# Patient Record
Sex: Female | Born: 1985 | Race: White | Hispanic: No | Marital: Married | Smoking: Current some day smoker
Health system: Southern US, Community
[De-identification: ages and names within clinical notes are randomized; demographics above are authoritative.]

## PROBLEM LIST (undated history)

## (undated) DIAGNOSIS — Z8619 Personal history of other infectious and parasitic diseases: Secondary | ICD-10-CM

## (undated) DIAGNOSIS — Z148 Genetic carrier of other disease: Secondary | ICD-10-CM

## (undated) DIAGNOSIS — F32A Depression, unspecified: Secondary | ICD-10-CM

## (undated) DIAGNOSIS — Z1501 Genetic susceptibility to malignant neoplasm of breast: Secondary | ICD-10-CM

## (undated) DIAGNOSIS — Z803 Family history of malignant neoplasm of breast: Secondary | ICD-10-CM

## (undated) DIAGNOSIS — Z1509 Genetic susceptibility to other malignant neoplasm: Secondary | ICD-10-CM

## (undated) DIAGNOSIS — N200 Calculus of kidney: Secondary | ICD-10-CM

## (undated) DIAGNOSIS — Z807 Family history of other malignant neoplasms of lymphoid, hematopoietic and related tissues: Secondary | ICD-10-CM

## (undated) DIAGNOSIS — N63 Unspecified lump in unspecified breast: Secondary | ICD-10-CM

## (undated) DIAGNOSIS — C73 Malignant neoplasm of thyroid gland: Secondary | ICD-10-CM

## (undated) DIAGNOSIS — Z8042 Family history of malignant neoplasm of prostate: Secondary | ICD-10-CM

## (undated) DIAGNOSIS — F988 Other specified behavioral and emotional disorders with onset usually occurring in childhood and adolescence: Secondary | ICD-10-CM

## (undated) DIAGNOSIS — Z8041 Family history of malignant neoplasm of ovary: Secondary | ICD-10-CM

## (undated) DIAGNOSIS — Z1502 Genetic susceptibility to malignant neoplasm of ovary: Secondary | ICD-10-CM

## (undated) DIAGNOSIS — F329 Major depressive disorder, single episode, unspecified: Secondary | ICD-10-CM

## (undated) HISTORY — DX: Genetic susceptibility to other malignant neoplasm: Z15.09

## (undated) HISTORY — DX: Family history of other malignant neoplasms of lymphoid, hematopoietic and related tissues: Z80.7

## (undated) HISTORY — DX: Unspecified lump in unspecified breast: N63.0

## (undated) HISTORY — DX: Family history of malignant neoplasm of ovary: Z80.41

## (undated) HISTORY — DX: Other specified behavioral and emotional disorders with onset usually occurring in childhood and adolescence: F98.8

## (undated) HISTORY — PX: NO PAST SURGERIES: SHX2092

## (undated) HISTORY — DX: Genetic carrier of other disease: Z14.8

## (undated) HISTORY — DX: Calculus of kidney: N20.0

## (undated) HISTORY — DX: Personal history of other infectious and parasitic diseases: Z86.19

## (undated) HISTORY — DX: Genetic susceptibility to malignant neoplasm of breast: Z15.01

## (undated) HISTORY — DX: Family history of malignant neoplasm of breast: Z80.3

## (undated) HISTORY — DX: Genetic susceptibility to malignant neoplasm of breast: Z15.02

## (undated) HISTORY — DX: Family history of malignant neoplasm of prostate: Z80.42

## (undated) HISTORY — DX: Depression, unspecified: F32.A

---
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
## 2005-07-22 ENCOUNTER — Other Ambulatory Visit: Admission: RE | Admit: 2005-07-22 | Discharge: 2005-07-22 | Payer: Self-pay | Admitting: Gynecology

## 2007-07-17 ENCOUNTER — Other Ambulatory Visit: Admission: RE | Admit: 2007-07-17 | Discharge: 2007-07-17 | Payer: Self-pay | Admitting: Gynecology

## 2008-07-17 ENCOUNTER — Ambulatory Visit: Payer: Self-pay | Admitting: Women's Health

## 2008-07-17 ENCOUNTER — Other Ambulatory Visit: Admission: RE | Admit: 2008-07-17 | Discharge: 2008-07-17 | Payer: Self-pay | Admitting: Gynecology

## 2008-07-17 ENCOUNTER — Encounter: Payer: Self-pay | Admitting: Women's Health

## 2010-03-08 DIAGNOSIS — N200 Calculus of kidney: Secondary | ICD-10-CM

## 2010-03-08 HISTORY — DX: Calculus of kidney: N20.0

## 2010-11-12 ENCOUNTER — Emergency Department (HOSPITAL_COMMUNITY): Payer: BC Managed Care – PPO

## 2010-11-12 ENCOUNTER — Emergency Department (HOSPITAL_COMMUNITY)
Admission: EM | Admit: 2010-11-12 | Discharge: 2010-11-12 | Disposition: A | Discharge status: Home / Self Care | Payer: BC Managed Care – PPO | Attending: Emergency Medicine | Admitting: Emergency Medicine

## 2010-11-12 DIAGNOSIS — N1330 Unspecified hydronephrosis: Secondary | ICD-10-CM | POA: Insufficient documentation

## 2010-11-12 DIAGNOSIS — R0682 Tachypnea, not elsewhere classified: Secondary | ICD-10-CM | POA: Insufficient documentation

## 2010-11-12 DIAGNOSIS — R1032 Left lower quadrant pain: Secondary | ICD-10-CM | POA: Insufficient documentation

## 2010-11-12 DIAGNOSIS — N201 Calculus of ureter: Secondary | ICD-10-CM | POA: Insufficient documentation

## 2010-11-12 LAB — URINE MICROSCOPIC-ADD ON

## 2010-11-12 LAB — URINALYSIS, ROUTINE W REFLEX MICROSCOPIC
Bilirubin Urine: NEGATIVE
Glucose, UA: NEGATIVE mg/dL
Ketones, ur: 40 mg/dL — AB
Leukocytes, UA: NEGATIVE
Nitrite: NEGATIVE
Protein, ur: NEGATIVE mg/dL
Specific Gravity, Urine: 1.021 (ref 1.005–1.030)
Urobilinogen, UA: 0.2 mg/dL (ref 0.0–1.0)
pH: 6 (ref 5.0–8.0)

## 2010-11-12 IMAGING — CT CT ABD-PELV W/O CM
1 of 2 series · 15 of 32 positions shown, 19 images · non-contrast
Comparison: None.

CLINICAL DATA: Acute onset left lower quadrant left flank pain.

CT ABDOMEN AND PELVIS WITHOUT CONTRAST
TECHNIQUE: Multidetector CT imaging of the abdomen and pelvis was
performed following the standard protocol without intravenous
contrast.

[Series 2: abd/pel w/o · axial · non-contrast · 0.66mm/px · z∈[+594,+980]mm · 15 of 85 slices shown, 19 images]
[im 4/85  soft-tissue]
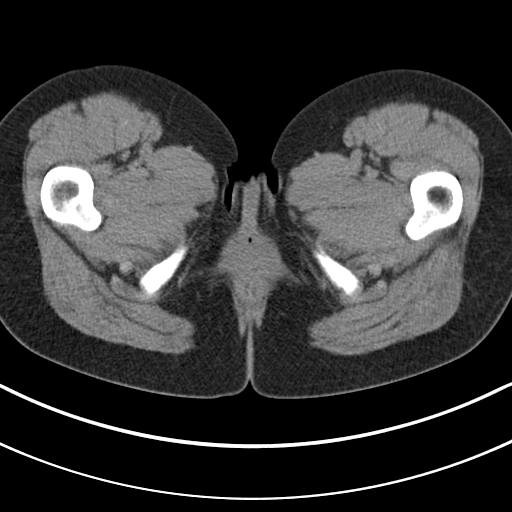
[im 4/85  bone]
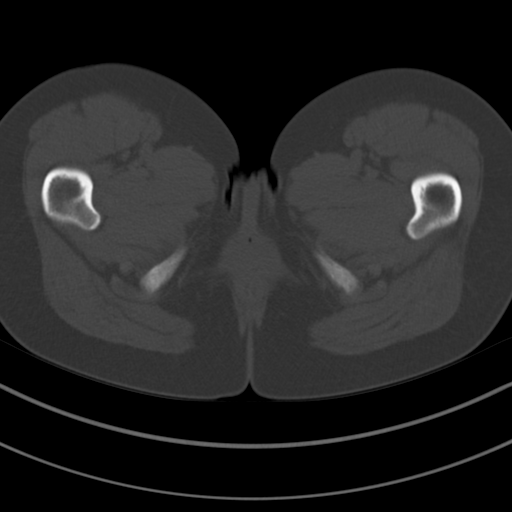
[im 10/85  soft-tissue]
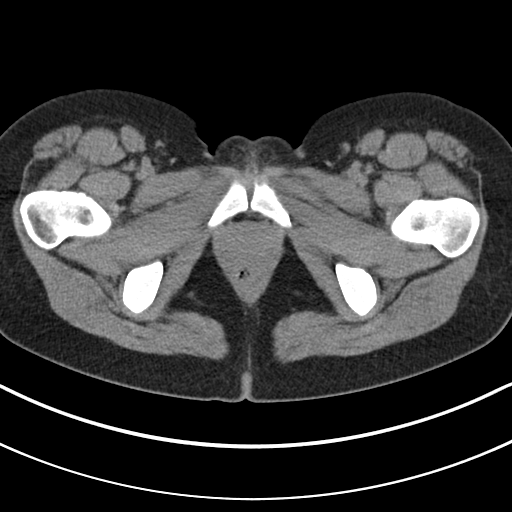
[im 17/85  soft-tissue]
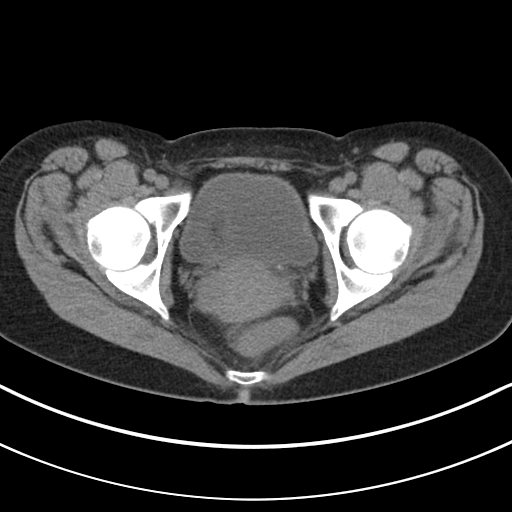
[im 23/85  soft-tissue]
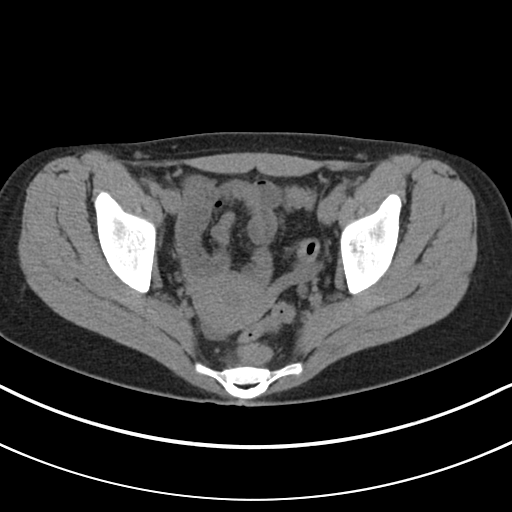
[im 30/85  soft-tissue]
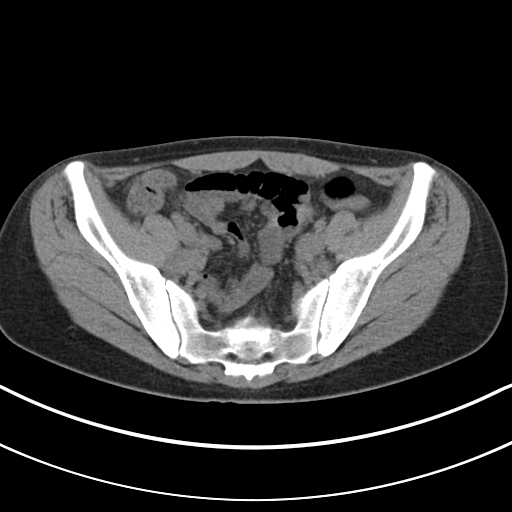
[im 36/85  soft-tissue]
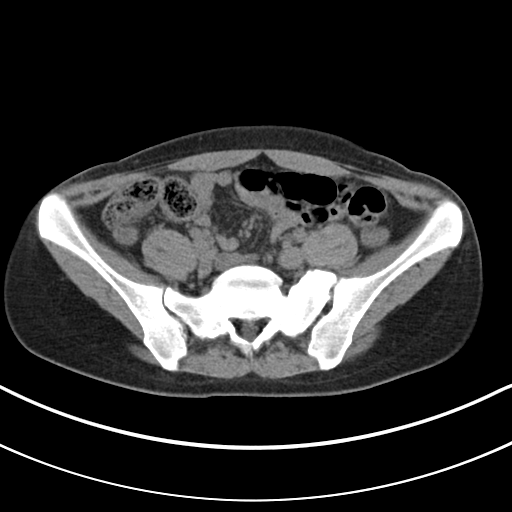
[im 43/85  soft-tissue]
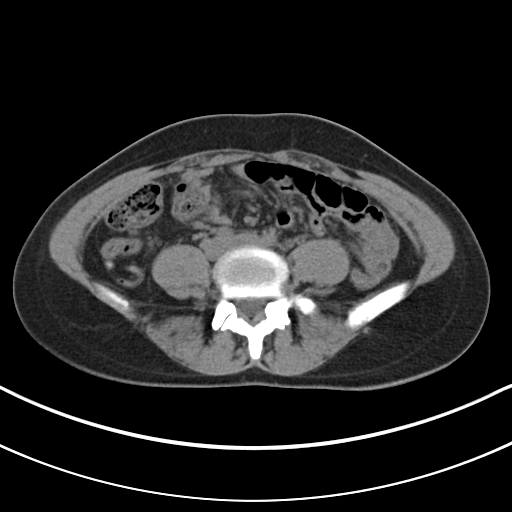
[im 49/85  soft-tissue]
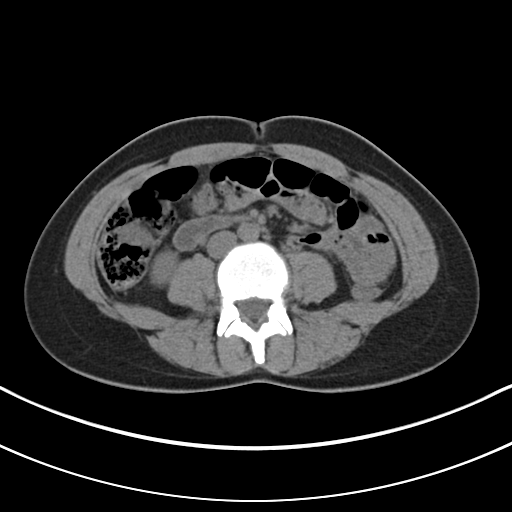
[im 55/85  soft-tissue]
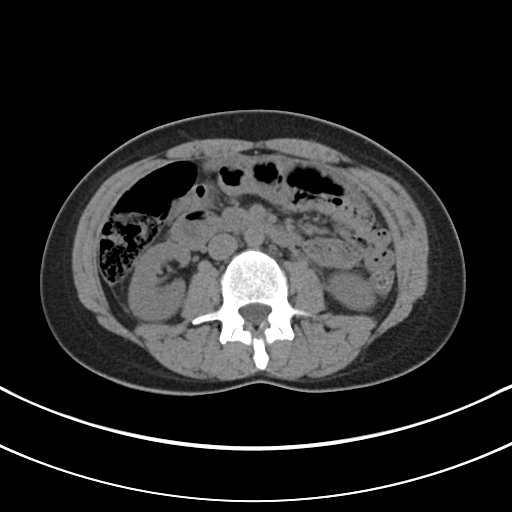
[im 55/85  bone]
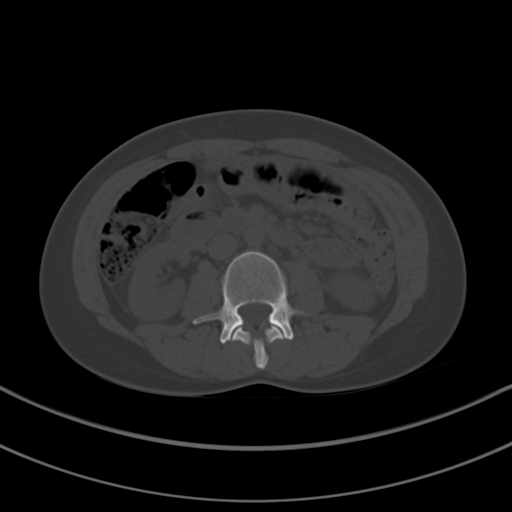
[im 62/85  soft-tissue]
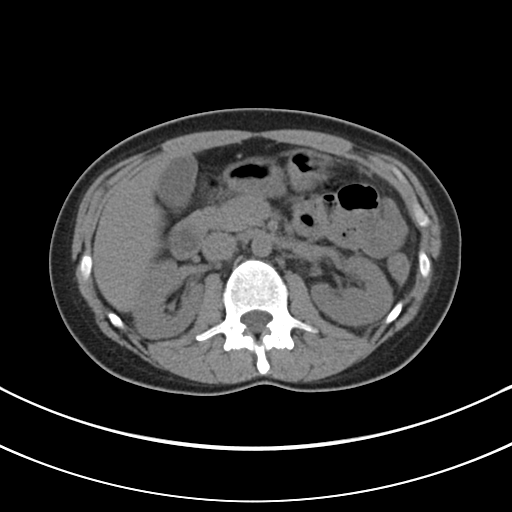
[im 68/85  soft-tissue]
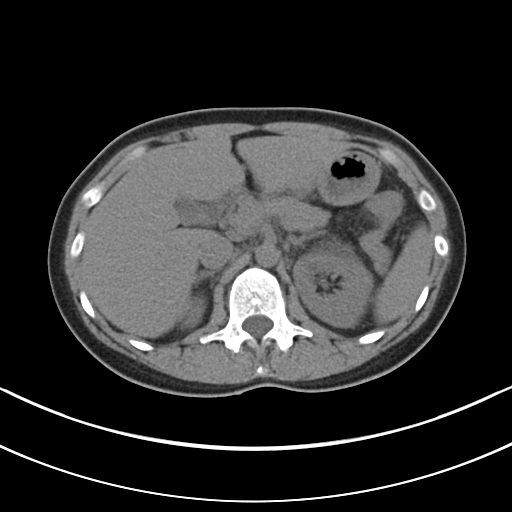
[im 72/85  lung]
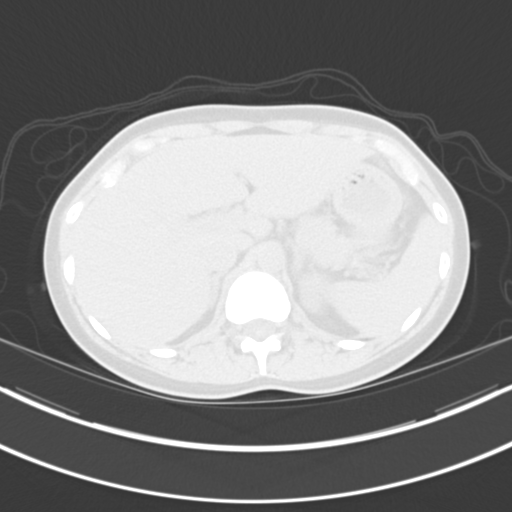
[im 75/85  soft-tissue]
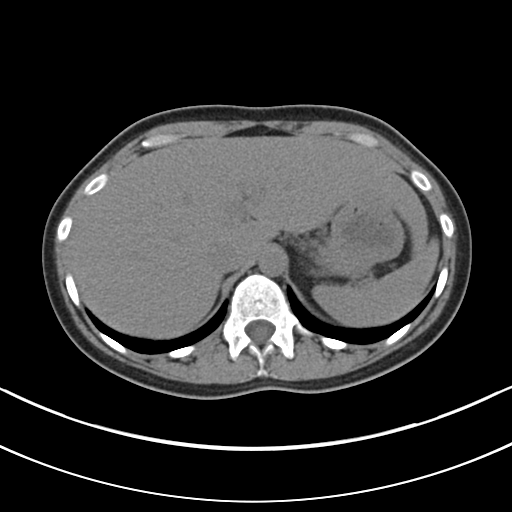
[im 75/85  lung]
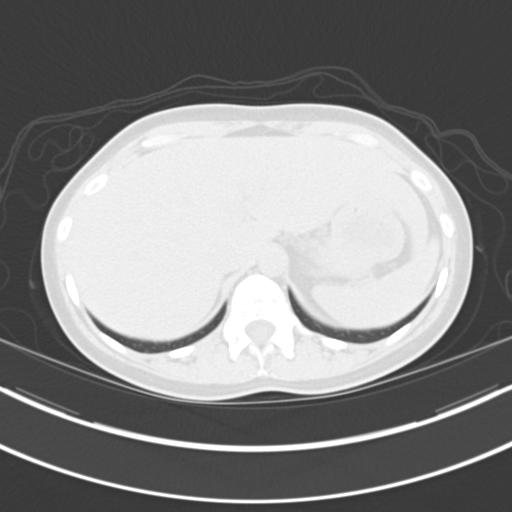
[im 78/85  lung]
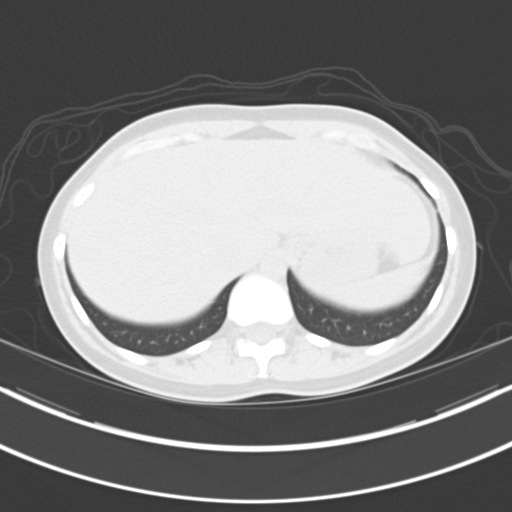
[im 81/85  soft-tissue]
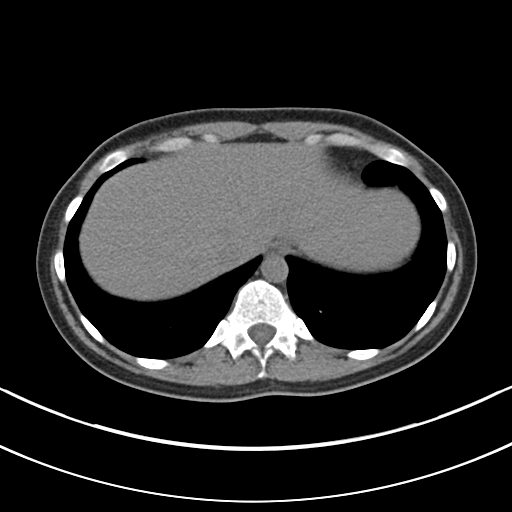
[im 81/85  lung]
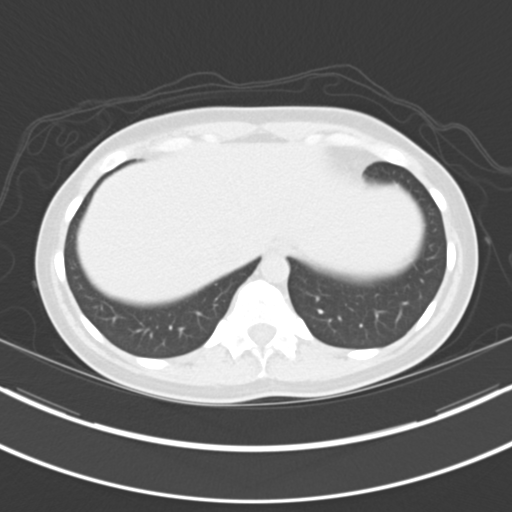

[15 of 32 positions shown; findings below may reference images not displayed]

FINDINGS: Imaged lung parenchyma is clear.  No pleural or
pericardial effusion.

The patient has mild left hydronephrosis with some stranding about
the left kidney and ureter due to a punctate stone which measures 1-
2 mm at the left UVJ.  No other urinary tract stones are
identified.  The kidneys are otherwise unremarkable in appearance.

The liver, gallbladder, spleen, adrenal glands and pancreas appear
normal.  Uterus and adnexa are normal in appearance.  Tiny amount
of free pelvic fluid is consistent with physiologic change.  The
stomach, small and large bowel and appendix are normal in
appearance.  There is no focal bony abnormality.  Partial
sacralization of the lowest lumbar segment on the left is
incidentally noted.
IMPRESSION: Mild left hydronephrosis due to a 1-2 mm stone at the left UVJ.  No
other urinary tract stones.

## 2010-11-13 LAB — URINE CULTURE
Colony Count: NO GROWTH
Culture  Setup Time: 201209070056
Culture: NO GROWTH

## 2010-11-22 ENCOUNTER — Emergency Department (HOSPITAL_COMMUNITY): Payer: BC Managed Care – PPO

## 2010-11-22 ENCOUNTER — Emergency Department (HOSPITAL_COMMUNITY)
Admission: EM | Admit: 2010-11-22 | Discharge: 2010-11-22 | Disposition: A | Discharge status: Home / Self Care | Payer: BC Managed Care – PPO | Attending: Emergency Medicine | Admitting: Emergency Medicine

## 2010-11-22 DIAGNOSIS — Z87442 Personal history of urinary calculi: Secondary | ICD-10-CM | POA: Insufficient documentation

## 2010-11-22 DIAGNOSIS — R109 Unspecified abdominal pain: Secondary | ICD-10-CM | POA: Insufficient documentation

## 2010-11-22 DIAGNOSIS — R112 Nausea with vomiting, unspecified: Secondary | ICD-10-CM | POA: Insufficient documentation

## 2010-11-22 LAB — DIFFERENTIAL
Basophils Absolute: 0.1 10*3/uL (ref 0.0–0.1)
Basophils Relative: 1 % (ref 0–1)
Eosinophils Absolute: 0.2 10*3/uL (ref 0.0–0.7)
Eosinophils Relative: 2 % (ref 0–5)
Lymphocytes Relative: 34 % (ref 12–46)
Lymphs Abs: 3.1 10*3/uL (ref 0.7–4.0)
Monocytes Absolute: 0.8 10*3/uL (ref 0.1–1.0)
Monocytes Relative: 9 % (ref 3–12)
Neutro Abs: 5 10*3/uL (ref 1.7–7.7)
Neutrophils Relative %: 54 % (ref 43–77)

## 2010-11-22 LAB — COMPREHENSIVE METABOLIC PANEL
ALT: 32 U/L (ref 0–35)
AST: 36 U/L (ref 0–37)
Albumin: 4.2 g/dL (ref 3.5–5.2)
Alkaline Phosphatase: 51 U/L (ref 39–117)
BUN: 7 mg/dL (ref 6–23)
CO2: 25 mEq/L (ref 19–32)
Calcium: 9.9 mg/dL (ref 8.4–10.5)
Chloride: 101 mEq/L (ref 96–112)
Creatinine, Ser: 0.79 mg/dL (ref 0.50–1.10)
GFR calc Af Amer: >60 mL/min (ref >60)
GFR calc non Af Amer: >60 mL/min (ref >60)
Glucose, Bld: 89 mg/dL (ref 70–99)
Potassium: 3.9 mEq/L (ref 3.5–5.1)
Sodium: 135 mEq/L (ref 135–145)
Total Bilirubin: 0.4 mg/dL (ref 0.3–1.2)
Total Protein: 7.2 g/dL (ref 6.0–8.3)

## 2010-11-22 LAB — URINALYSIS, ROUTINE W REFLEX MICROSCOPIC
Bilirubin Urine: NEGATIVE
Glucose, UA: NEGATIVE mg/dL
Hgb urine dipstick: NEGATIVE
Leukocytes, UA: NEGATIVE
Nitrite: NEGATIVE
Protein, ur: NEGATIVE mg/dL
Specific Gravity, Urine: 1.009 (ref 1.005–1.030)
Urobilinogen, UA: 0.2 mg/dL (ref 0.0–1.0)
pH: 7 (ref 5.0–8.0)

## 2010-11-22 LAB — CBC
HCT: 37.2 % (ref 36.0–46.0)
Hemoglobin: 12.9 g/dL (ref 12.0–15.0)
MCH: 33.9 pg (ref 26.0–34.0)
MCHC: 34.7 g/dL (ref 30.0–36.0)
MCV: 97.6 fL (ref 78.0–100.0)
Platelets: 362 10*3/uL (ref 150–400)
RBC: 3.81 MIL/uL — ABNORMAL LOW (ref 3.87–5.11)
RDW: 12.8 % (ref 11.5–15.5)
WBC: 9.2 10*3/uL (ref 4.0–10.5)

## 2010-11-22 LAB — POCT PREGNANCY, URINE: Preg Test, Ur: NEGATIVE

## 2010-11-22 LAB — LIPASE, BLOOD: Lipase: 34 U/L (ref 11–59)

## 2010-11-22 IMAGING — CR DG ABDOMEN 1V
1 series · 1 of 1 positions shown · non-contrast
Comparison: CT of the abdomen and pelvis - [DATE]

CLINICAL DATA: Pain, history of left kidney stone passed
approximate 1 week ago, new onset of left-sided flank pain.

ABDOMEN - 1 VIEW

[t abdomen supine]
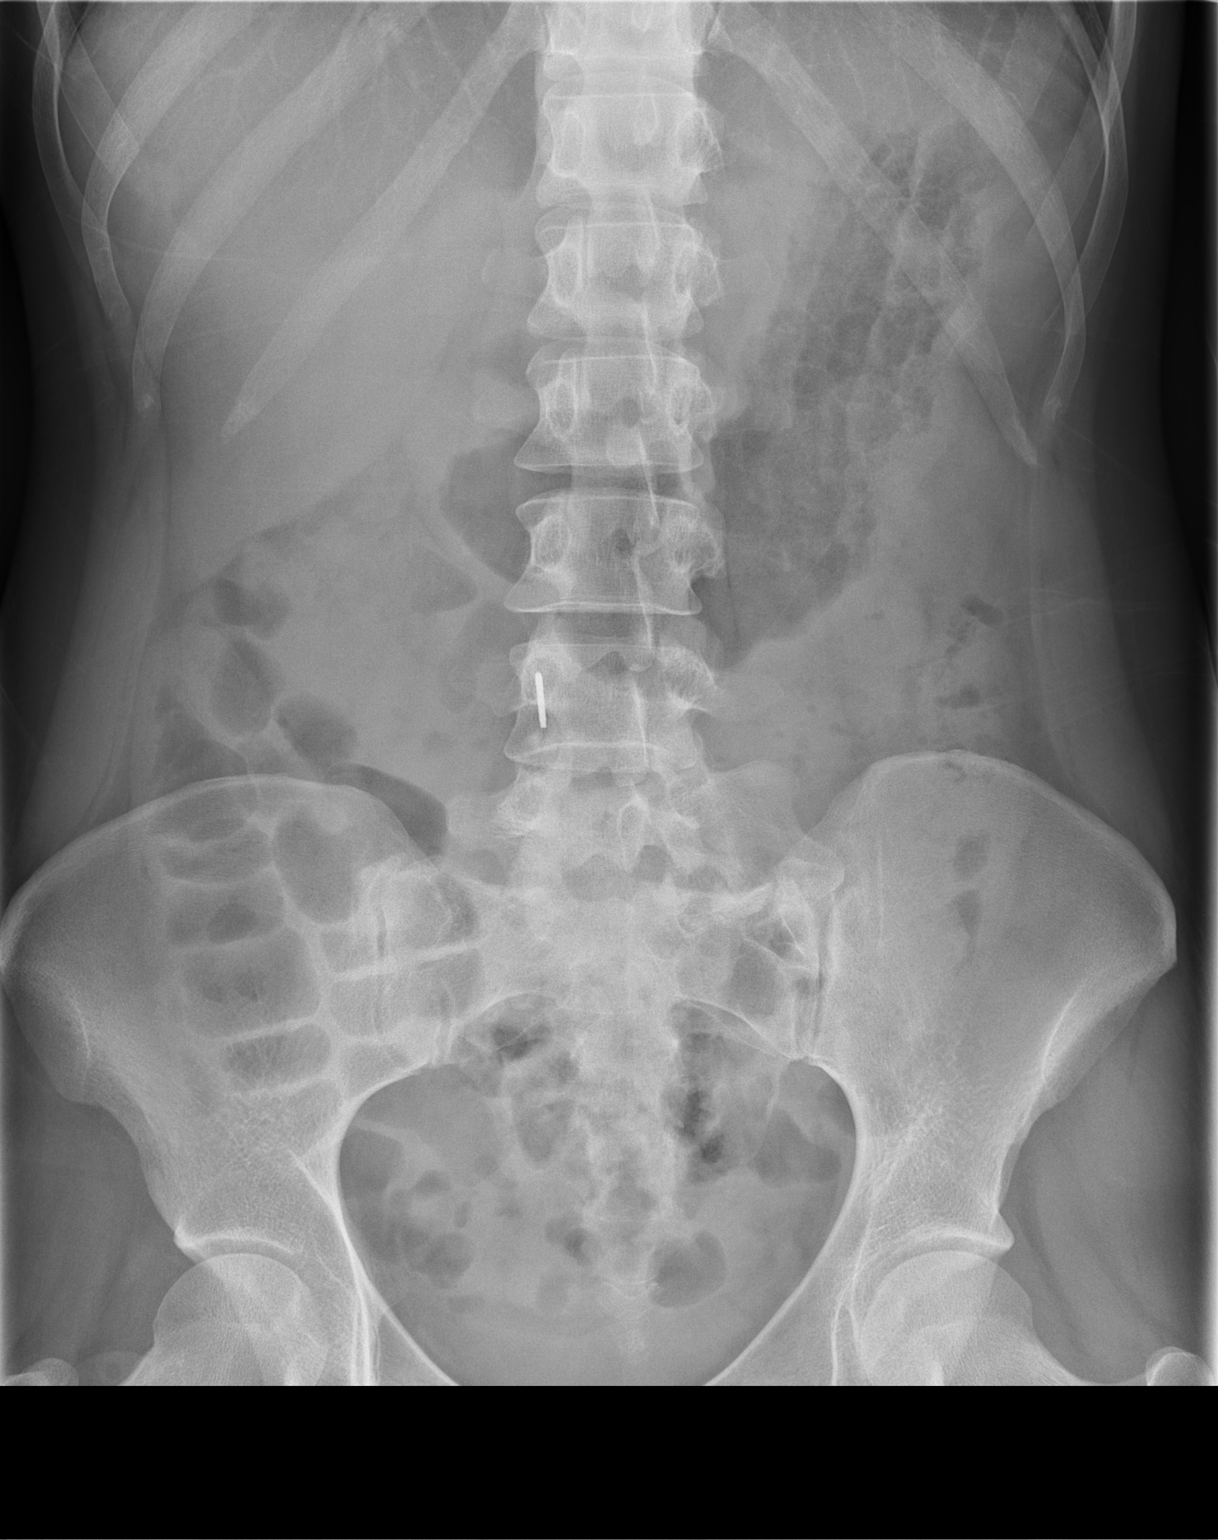

[1 of 1 positions shown; findings below may reference images not displayed]

FINDINGS: Nonobstructive bowel gas pattern.  Evaluation for
pneumoperitoneum is limited secondary to supine patient positioning
and exclusion of the lower thorax.  The previously identified
punctate (approximately 2 mm) stone regional to the left UVJ is not
definitely identified on this examination.  No acute osseous
abnormalities.
IMPRESSION: 1.  Nonobstructive bowel gas pattern.
2.  Previously identiifed punctate (approximate 2 mm) stone
regional to the left UVJ is not definitely identified on this
examination.

## 2010-11-23 LAB — URINE CULTURE
Colony Count: NO GROWTH
Culture  Setup Time: 201209162234
Culture: NO GROWTH

## 2012-03-23 ENCOUNTER — Encounter: Payer: Self-pay | Admitting: Women's Health

## 2012-03-23 ENCOUNTER — Other Ambulatory Visit (HOSPITAL_COMMUNITY)
Admission: RE | Admit: 2012-03-23 | Discharge: 2012-03-23 | Disposition: A | Discharge status: Home / Self Care | Payer: 59 | Source: Ambulatory Visit | Attending: Obstetrics and Gynecology | Admitting: Obstetrics and Gynecology

## 2012-03-23 ENCOUNTER — Ambulatory Visit (INDEPENDENT_AMBULATORY_CARE_PROVIDER_SITE_OTHER): Payer: 59 | Admitting: Women's Health

## 2012-03-23 VITALS — BP 112/74 | Ht 66.0 in | Wt 140.0 lb

## 2012-03-23 DIAGNOSIS — Z01419 Encounter for gynecological examination (general) (routine) without abnormal findings: Secondary | ICD-10-CM

## 2012-03-23 LAB — CBC WITH DIFFERENTIAL/PLATELET
Basophils Absolute: 0 10*3/uL (ref 0.0–0.1)
Basophils Relative: 0 % (ref 0–1)
Eosinophils Absolute: 0.2 10*3/uL (ref 0.0–0.7)
Eosinophils Relative: 3 % (ref 0–5)
HCT: 40 % (ref 36.0–46.0)
Hemoglobin: 13.6 g/dL (ref 12.0–15.0)
Lymphocytes Relative: 29 % (ref 12–46)
Lymphs Abs: 2 10*3/uL (ref 0.7–4.0)
MCH: 32.4 pg (ref 26.0–34.0)
MCHC: 34 g/dL (ref 30.0–36.0)
MCV: 95.2 fL (ref 78.0–100.0)
Monocytes Absolute: 0.6 10*3/uL (ref 0.1–1.0)
Monocytes Relative: 8 % (ref 3–12)
Neutro Abs: 4.2 10*3/uL (ref 1.7–7.7)
Neutrophils Relative %: 60 % (ref 43–77)
Platelets: 321 10*3/uL (ref 150–400)
RBC: 4.2 MIL/uL (ref 3.87–5.11)
RDW: 12.8 % (ref 11.5–15.5)
WBC: 6.9 10*3/uL (ref 4.0–10.5)
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
## 2012-03-23 NOTE — Progress Notes (Signed)
Kayla Little February 11, 1986 [id number]    History:    The patient presents for annual exam.  Regular monthly cycle using no contraception for 2 months, desiring conception. Mother diagnosed with breast cancer 60, mastectomy, chemotherapy and radiation. Last Pap 2010, normal. Quit smoking. Rubella immune 2009. ADD, occasional Vyvanse per primary care.   Past medical history, past surgical history, family history and social history were all reviewed and documented in the EPIC chart. Works at [employer] Received 3 gardasil injections, 2007, 2009, 2010. Had been a nursing school, quit plans to go back.   ROS:  A  ROS was performed and pertinent positives and negatives are included in the history.  Exam:  Filed Vitals:   03/23/12 1019  BP: 112/74    General appearance:  Normal Head/Neck:  Normal, without cervical or supraclavicular adenopathy. Thyroid:  Symmetrical, normal in size, without palpable masses or nodularity. Respiratory  Effort:  Normal  Auscultation:  Clear without wheezing or rhonchi Cardiovascular  Auscultation:  Regular rate, without rubs, murmurs or gallops  Edema/varicosities:  Not grossly evident Abdominal  Soft,nontender, without masses, guarding or rebound.  Liver/spleen:  No organomegaly noted  Hernia:  None appreciated  Skin  Inspection:  Grossly normal  Palpation:  Grossly normal Neurologic/psychiatric  Orientation:  Normal with appropriate conversation.  Mood/affect:  Normal  Genitourinary    Breasts: Examined lying and sitting.     Right: Without masses, retractions, discharge or axillary adenopathy.     Left: Without masses, retractions, discharge or axillary adenopathy.   Inguinal/mons:  Normal without inguinal adenopathy  External genitalia:  Normal  BUS/Urethra/Skene's glands:  Normal  Bladder:  Normal  Vagina:  Normal  Cervix:  Normal  Uterus:   normal in size, shape and contour.  Midline and mobile  Adnexa/parametria:     Rt: Without masses  or tenderness.   Lt: Without masses or tenderness.  Anus and perineum: Normal  Digital rectal exam: Normal sphincter tone without palpated masses or tenderness  Assessment/Plan:  27 y.o. M. WF G0 for annual exam.    Normal GYN exam desiring conception  Plan: Reviewed BRCA testing, will check if mother has had. MVI prenatal vitamin daily encouraged, reviewed importance of no Vyvance, smoking, avoid over-the-counter meds, no alcohol while trying to conceive. Ovulation, conception reviewed, return to office for viability ultrasound after missed cycle. Aware we no longer deliver. SBE's, exercise, calcium rich diet encouraged. CBC, UA, Pap. Normal Pap 2010, new screening guidelines reviewed.      Harrington Challenger [facility], 12:53 PM 03/23/2012

## 2012-03-24 LAB — URINALYSIS W MICROSCOPIC + REFLEX CULTURE
Bilirubin Urine: NEGATIVE
Casts: NONE SEEN
Glucose, UA: NEGATIVE mg/dL
Ketones, ur: NEGATIVE mg/dL
Leukocytes, UA: NEGATIVE
Nitrite: NEGATIVE
Protein, ur: NEGATIVE mg/dL
Specific Gravity, Urine: 1.023 (ref 1.005–1.030)
Urobilinogen, UA: 0.2 mg/dL (ref 0.0–1.0)
pH: 5.5 (ref 5.0–8.0)

## 2012-03-25 LAB — URINE CULTURE
Colony Count: NO GROWTH
Organism ID, Bacteria: NO GROWTH

## 2012-12-04 ENCOUNTER — Ambulatory Visit: Payer: 59

## 2012-12-04 ENCOUNTER — Ambulatory Visit (INDEPENDENT_AMBULATORY_CARE_PROVIDER_SITE_OTHER): Payer: 59 | Admitting: Family Medicine

## 2012-12-04 VITALS — BP 114/82 | HR 107 | Temp 98.5°F | Resp 17 | Ht 66.5 in | Wt 137.0 lb

## 2012-12-04 DIAGNOSIS — R1032 Left lower quadrant pain: Secondary | ICD-10-CM

## 2012-12-04 DIAGNOSIS — E860 Dehydration: Secondary | ICD-10-CM

## 2012-12-04 DIAGNOSIS — R197 Diarrhea, unspecified: Secondary | ICD-10-CM

## 2012-12-04 LAB — POCT CBC
Granulocyte percent: 83.9 %G — AB (ref 37–80)
HCT, POC: 41.6 % (ref 37.7–47.9)
Hemoglobin: 13.3 g/dL (ref 12.2–16.2)
Lymph, poc: 1.7 (ref 0.6–3.4)
MCH, POC: 33.3 pg — AB (ref 27–31.2)
MCHC: 32 g/dL (ref 31.8–35.4)
MCV: 103.9 fL — AB (ref 80–97)
MID (cbc): 0.8 (ref 0–0.9)
MPV: 9 fL (ref 0–99.8)
POC Granulocyte: 12.9 — AB (ref 2–6.9)
POC LYMPH PERCENT: 10.8 %L (ref 10–50)
POC MID %: 5.3 %M (ref 0–12)
Platelet Count, POC: 323 10*3/uL (ref 142–424)
RBC: 4 M/uL — AB (ref 4.04–5.48)
RDW, POC: 12.7 %
WBC: 15.4 10*3/uL — AB (ref 4.6–10.2)

## 2012-12-04 LAB — POCT URINALYSIS DIPSTICK
Blood, UA: NEGATIVE
Glucose, UA: NEGATIVE
Ketones, UA: 160
Leukocytes, UA: NEGATIVE
Nitrite, UA: NEGATIVE
Protein, UA: NEGATIVE
Spec Grav, UA: >=1.03
Urobilinogen, UA: 0.2
pH, UA: 5.5

## 2012-12-04 LAB — COMPREHENSIVE METABOLIC PANEL
ALT: 13 U/L (ref 0–35)
AST: 17 U/L (ref 0–37)
Albumin: 4.3 g/dL (ref 3.5–5.2)
Alkaline Phosphatase: 41 U/L (ref 39–117)
BUN: 7 mg/dL (ref 6–23)
CO2: 23 mEq/L (ref 19–32)
Calcium: 9.4 mg/dL (ref 8.4–10.5)
Chloride: 102 mEq/L (ref 96–112)
Creat: 0.82 mg/dL (ref 0.50–1.10)
Glucose, Bld: 78 mg/dL (ref 70–99)
Potassium: 3.7 mEq/L (ref 3.5–5.3)
Sodium: 134 mEq/L — ABNORMAL LOW (ref 135–145)
Total Bilirubin: 0.7 mg/dL (ref 0.3–1.2)
Total Protein: 6.5 g/dL (ref 6.0–8.3)

## 2012-12-04 LAB — POCT UA - MICROSCOPIC ONLY
Bacteria, U Microscopic: NEGATIVE
Casts, Ur, LPF, POC: NEGATIVE
Crystals, Ur, HPF, POC: NEGATIVE
RBC, urine, microscopic: NEGATIVE
Yeast, UA: NEGATIVE

## 2012-12-04 LAB — POCT URINE PREGNANCY: Preg Test, Ur: NEGATIVE

## 2012-12-04 IMAGING — CR DG ABDOMEN ACUTE W/ 1V CHEST
3 series · 3 of 3 positions shown · non-contrast
Comparison: Radiograph dated [DATE]

CLINICAL DATA: Left lower quadrant pain.

EXAM:
ACUTE ABDOMEN SERIES (ABDOMEN 2 VIEW & CHEST 1 VIEW)

[PA]
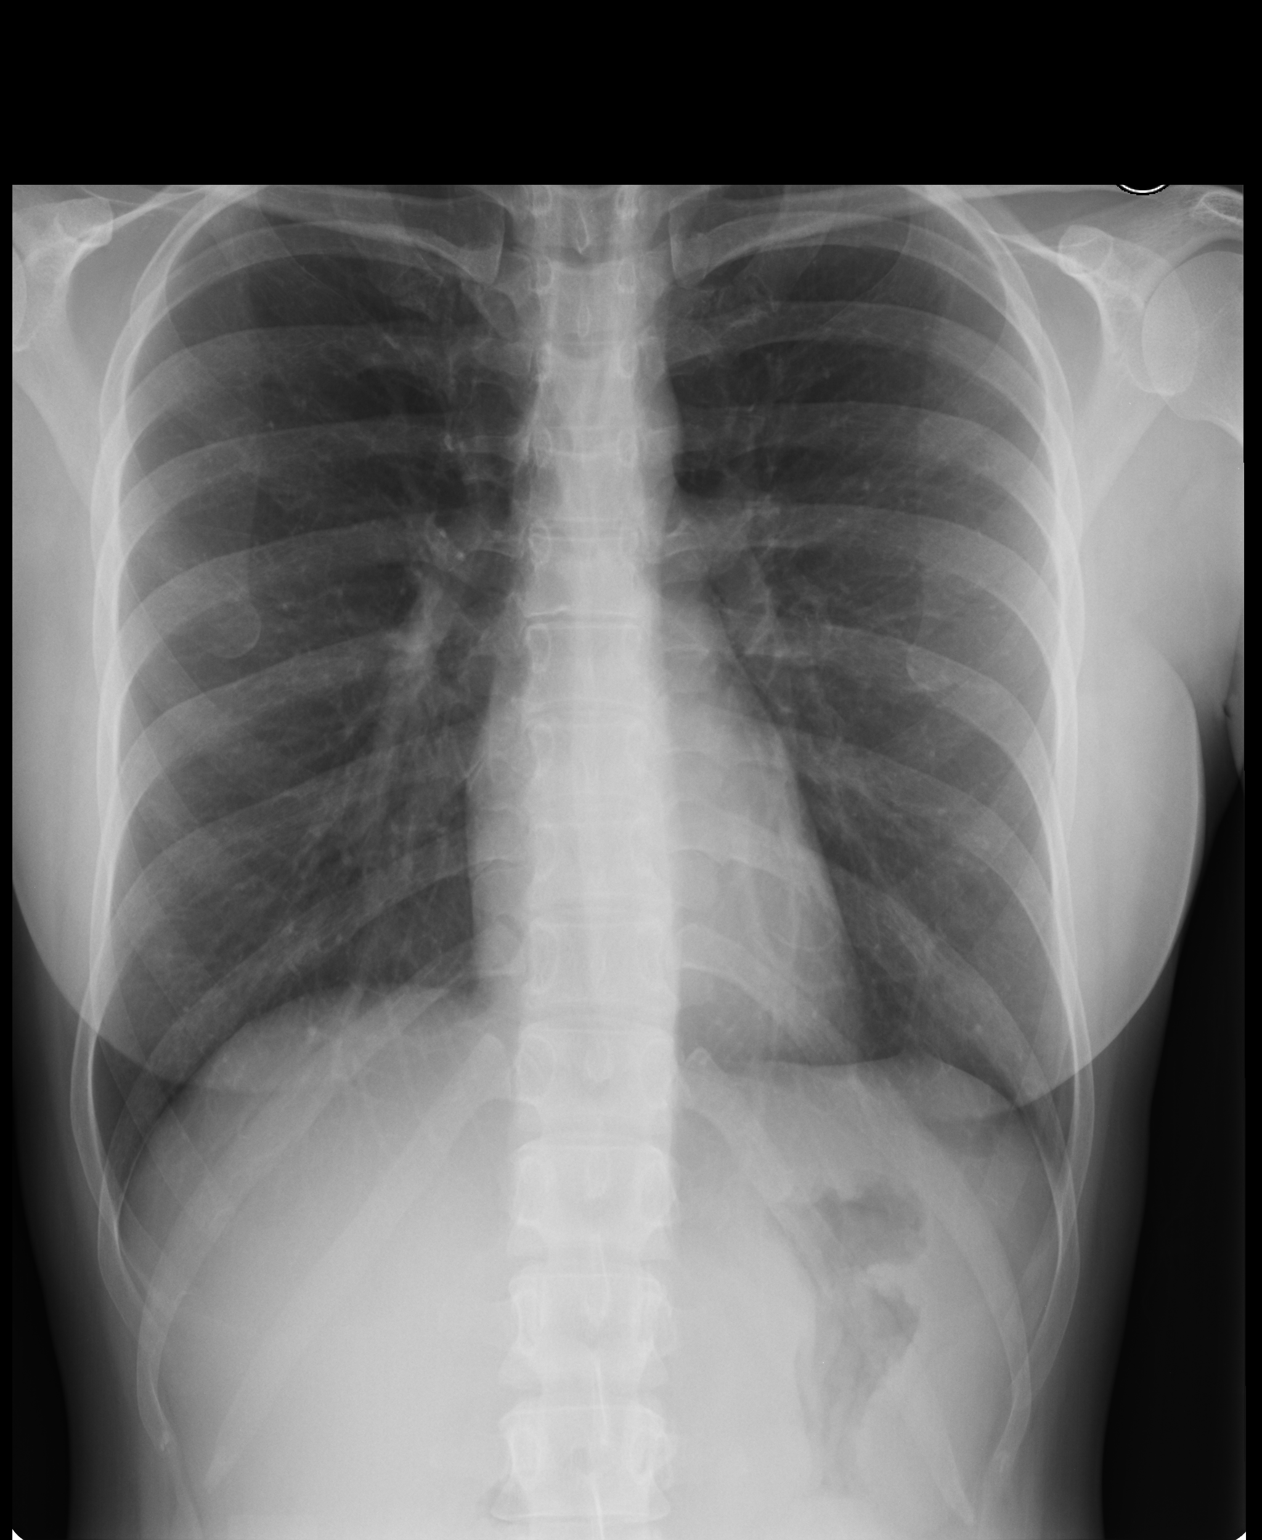

[AP (1 of 2)]
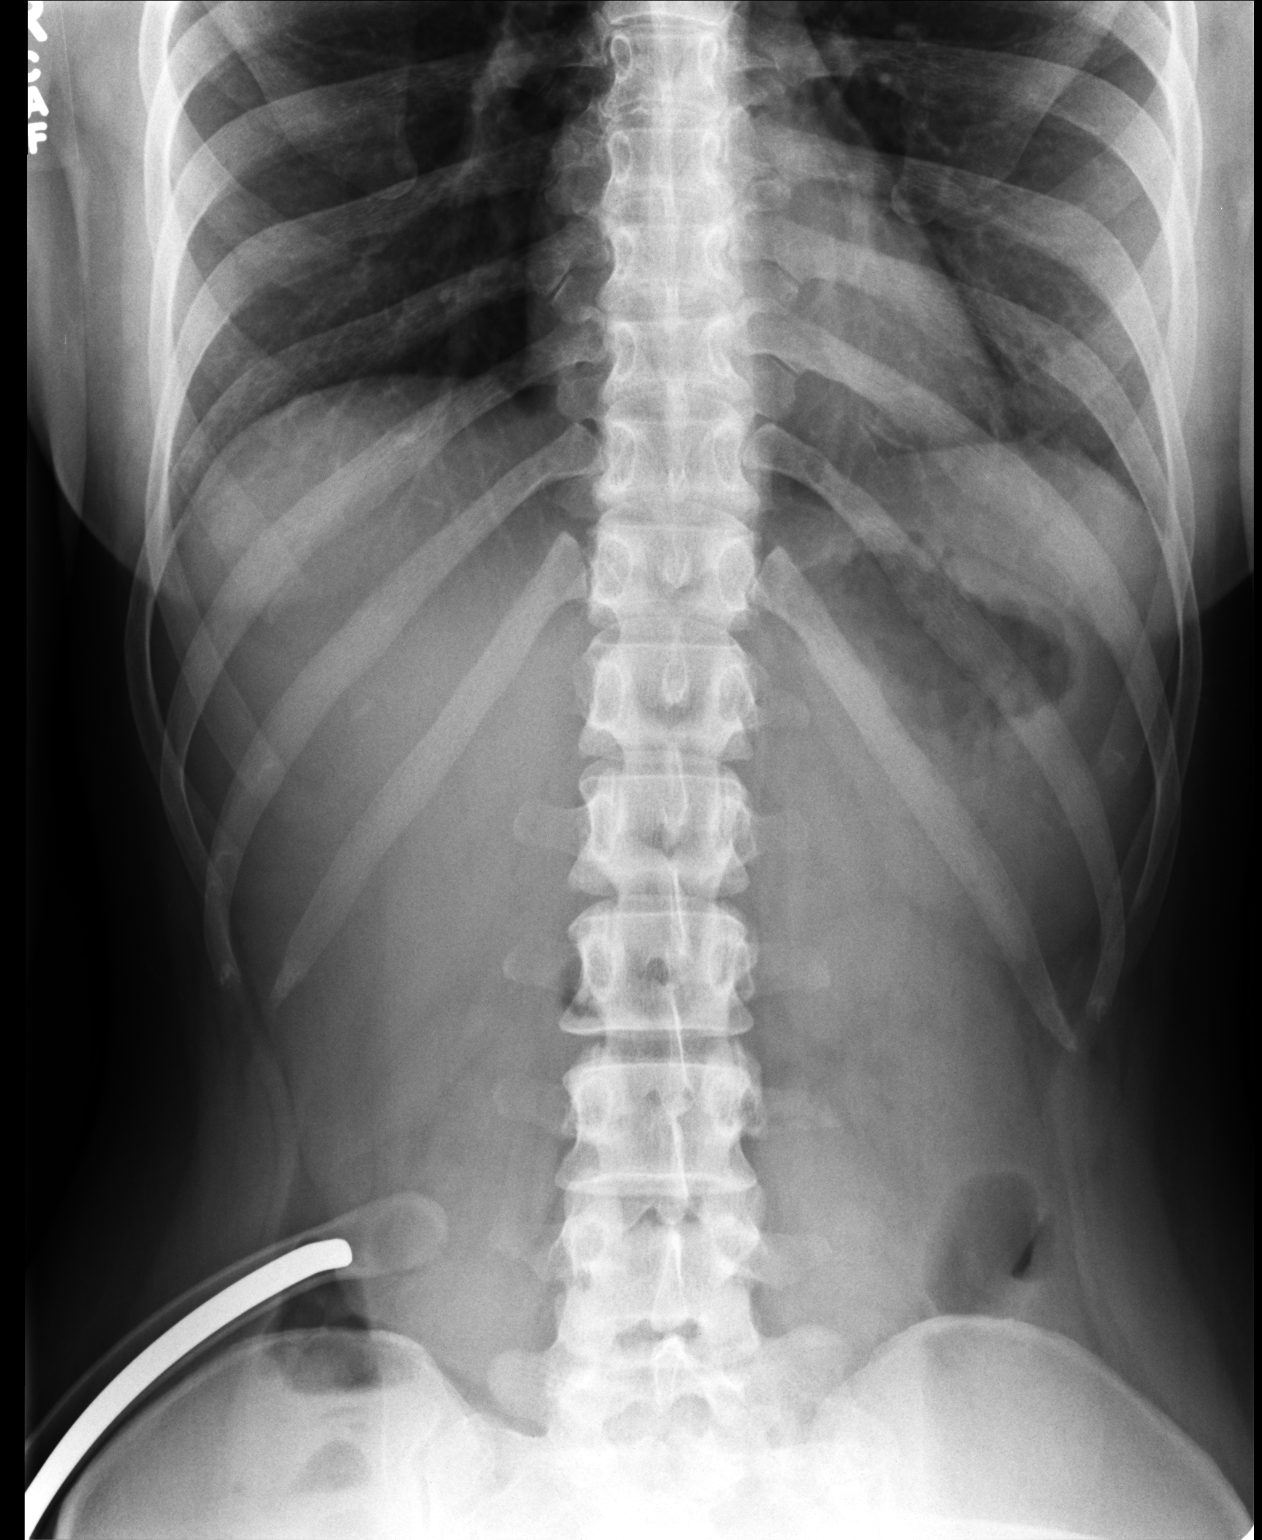

[AP (2 of 2)]
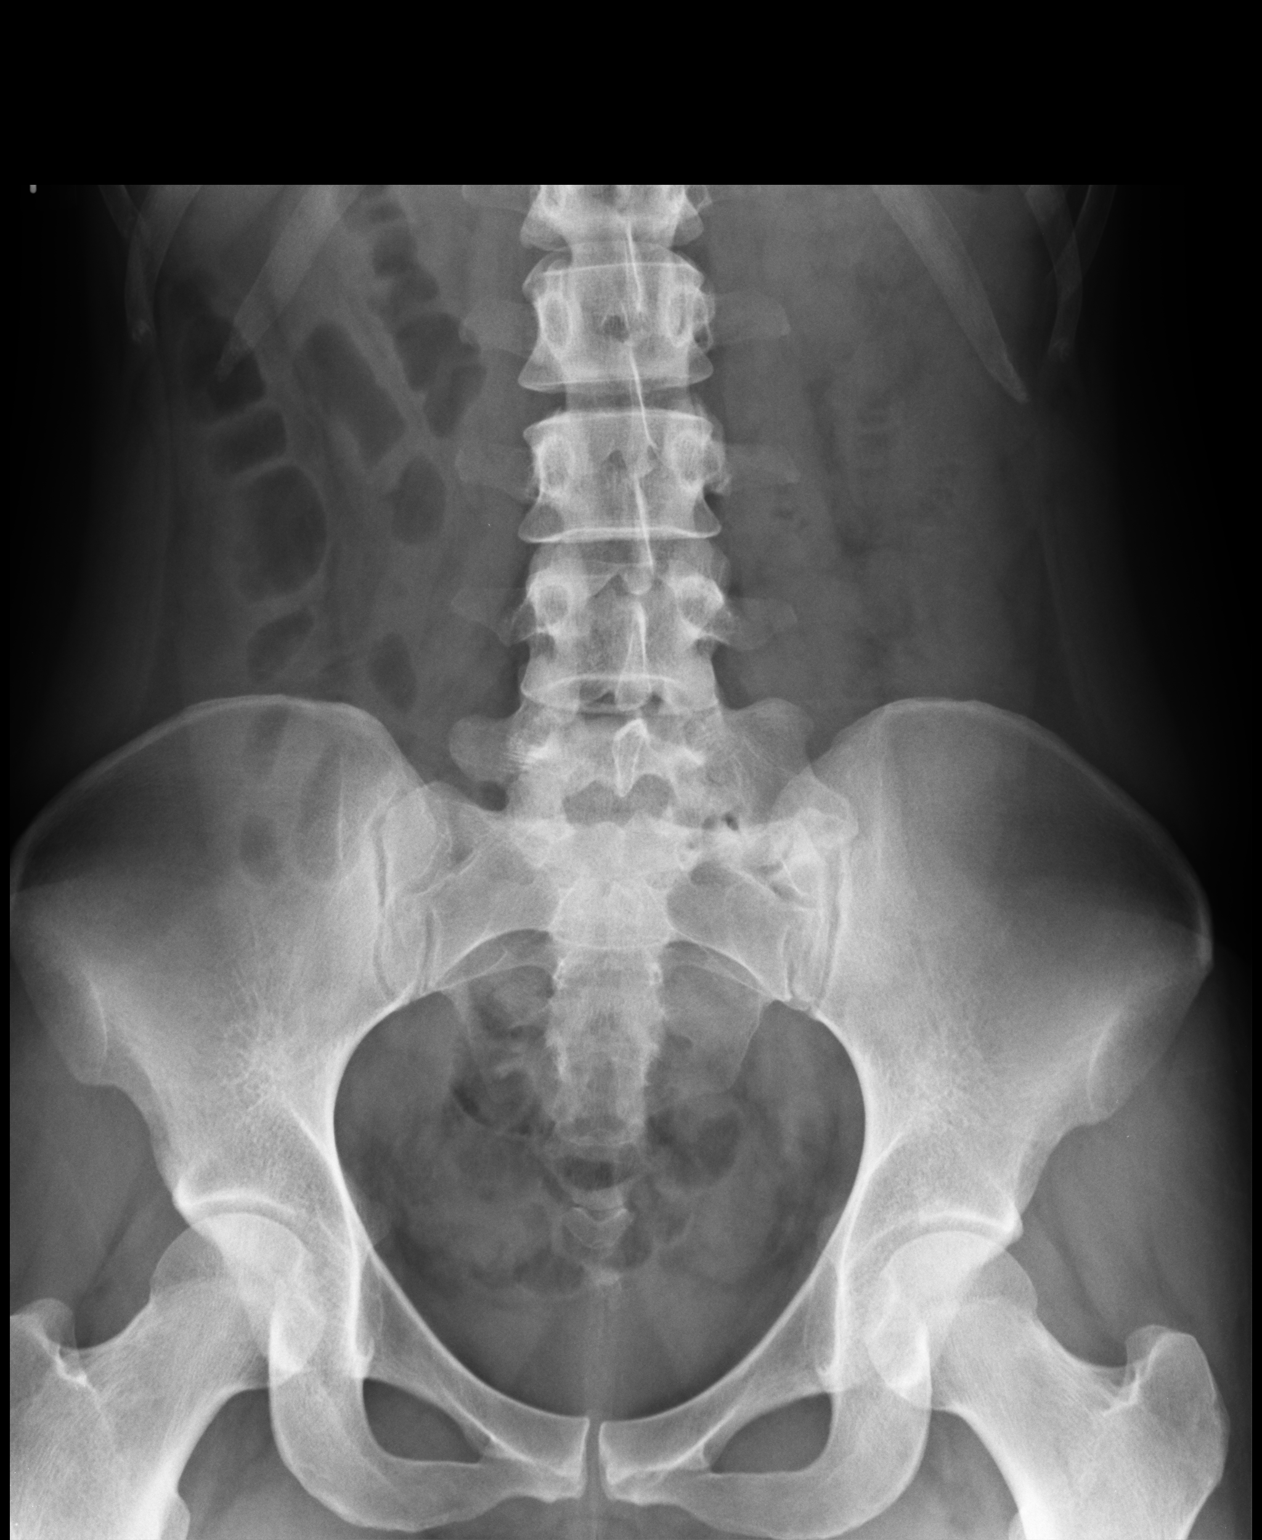

[3 of 3 positions shown; findings below may reference images not displayed]

FINDINGS: There is no evidence of dilated bowel loops or free intraperitoneal
air. No radiopaque calculi or other significant radiographic
abnormality is seen. Heart size and mediastinal contours are within
normal limits. Both lungs are clear.
IMPRESSION: Negative abdominal radiographs.  No acute cardiopulmonary disease.

## 2012-12-04 NOTE — Patient Instructions (Addendum)
Rest and drink plenty of fluids.  Eat some bland food such as broth, rice, toast if you would like.  Please come and see me tomorrow.  If you are getting worse, have a fever, or have worsening pain please go to the nearest ER.    FAST TRACK TO SEE DR. Santresa Levett ON 9/30

## 2012-12-04 NOTE — Progress Notes (Signed)
[facility] [address], [place] [address] [phone]  Date:  12/04/2012   Name:  Kayla Little   DOB:  30-Aug-1985   MRN:  [id number]  PCP:  Harrington Challenger, NP    Chief Complaint: Abdominal Pain   History of Present Illness:  Kayla Little is a 27 y.o. very pleasant female patient who presents with the following:  This am around 0630 she noted a cramp in her left lower abdomen. She did not think too much of it.  She thought she might have just eaten some dairy which can cause her some GI sx.  She went to urinate- this was fine.  She tried to have a BM but could not.  The pain seemed to get better, then she started having diarrhea.  She will have "waves" of pain from her umbilicus down into her LLQ.  She will then sometimes need to have a BM, but not always  She has not noted a fever (she did check her temp at home) but she will have some chills.  No vomiting. She has had slight nausea.  No suspicious foods yesterday.    She is generally healthy.  She did have a kidney stone 2 years ago.  She thinks it passed on it's own.  She did not require any other treatment for the stone.  LMP about 2 weeks ago.  No vaginal or urinary sx.  No STI risk that she is aware of- she is married.  She manages [employer]  No history of any operations.   She did drink some water today and ginger ale today but has not eaten anything.  She takes medication for ADD, no other chronic meds  There are no active problems to display for this patient.   Past Medical History  Diagnosis Date  . ADD (attention deficit disorder)   . Kidney stone 2012    No past surgical history on file.  History  Substance Use Topics  . Smoking status: Current Some Day Smoker -- 1.00 packs/day for 5 years    Types: Cigarettes  . Smokeless tobacco: Not on file  . Alcohol Use: Yes    Family History  Problem Relation Age of Onset  . Breast cancer Mother 8    2 types of cancer one side  mastectomy with chemo  . Endometriosis Mother   . Breast cancer Maternal Grandmother   . Endometriosis Maternal Grandmother   . Cancer Maternal Grandfather     brain    No Known Allergies  Medication list has been reviewed and updated.  Current Outpatient Prescriptions on File Prior to Visit  Medication Sig Dispense Refill  . lisdexamfetamine (VYVANSE) 60 MG capsule Take 60 mg by mouth every morning.       No current facility-administered medications on file prior to visit.    Review of Systems:  As per HPI- otherwise negative.   Physical Examination: Filed Vitals:   12/04/12 1518  BP: 114/82  Pulse: 107  Temp: 98.5 F (36.9 C)  Resp: 17   Filed Vitals:   12/04/12 1518  Height: 5' 6.5" (1.689 m)  Weight: 137 lb (62.143 kg)   Body mass index is 21.78 kg/(m^2). Ideal Body Weight: Weight in (lb) to have BMI = 25: 156.9  GEN: WDWN, NAD, Non-toxic, A & O x 3, looks well HEENT: Atraumatic, Normocephalic. Neck supple. No masses, No LAD. Bilateral TM wnl, oropharynx normal.  PEERL,EOMI.   Ears and Nose:  No external deformity. CV: RRR- mild tachycardia, No M/G/R. No JVD. No thrill. No extra heart sounds. PULM: CTA B, no wheezes, crackles, rhonchi. No retractions. No resp. distress. No accessory muscle use. ABD: S,  ND, +BS. No HSM.  LLQ tendernss and minimal rebound tenderness LLQ only EXTR: No c/c/e NEURO Normal gait.  PSYCH: Normally interactive. Conversant. Not depressed or anxious appearing.  Calm demeanor.  Gu:  Normal exam.  Cervix is normal, no CMT. No adnexal masses or tenderness   Results for orders placed in visit on 12/04/12  POCT CBC      Result Value Range   WBC 15.4 (*) 4.6 - 10.2 K/uL   Lymph, poc 1.7  0.6 - 3.4   POC LYMPH PERCENT 10.8  10 - 50 %L   MID (cbc) 0.8  0 - 0.9   POC MID % 5.3  0 - 12 %M   POC Granulocyte 12.9 (*) 2 - 6.9   Granulocyte percent 83.9 (*) 37 - 80 %G   RBC 4.00 (*) 4.04 - 5.48 M/uL   Hemoglobin 13.3  12.2 - 16.2 g/dL    HCT, POC 16.1  09.6 - 47.9 %   MCV 103.9 (*) 80 - 97 fL   MCH, POC 33.3 (*) 27 - 31.2 pg   MCHC 32.0  31.8 - 35.4 g/dL   RDW, POC 04.5     Platelet Count, POC 323  142 - 424 K/uL   MPV 9.0  0 - 99.8 fL  POCT UA - MICROSCOPIC ONLY      Result Value Range   WBC, Ur, HPF, POC 2-4     RBC, urine, microscopic neg     Bacteria, U Microscopic neg     Mucus, UA trace     Epithelial cells, urine per micros 2-3     Crystals, Ur, HPF, POC neg     Casts, Ur, LPF, POC neg     Yeast, UA neg    POCT URINALYSIS DIPSTICK      Result Value Range   Color, UA yellow     Clarity, UA clear     Glucose, UA neg     Bilirubin, UA small     Ketones, UA 160     Spec Grav, UA >=1.030     Blood, UA neg     pH, UA 5.5     Protein, UA neg     Urobilinogen, UA 0.2     Nitrite, UA neg     Leukocytes, UA Negative    POCT URINE PREGNANCY      Result Value Range   Preg Test, Ur Negative     [facility] reading (PRIMARY) by  Dr. Patsy Lager. abd series:  Negative for obstruction.   ACUTE ABDOMEN SERIES (ABDOMEN 2 VIEW & CHEST 1 VIEW)  COMPARISON: Radiograph dated 11/22/2010  FINDINGS: There is no evidence of dilated bowel loops or free intraperitoneal air. No radiopaque calculi or other significant radiographic abnormality is seen. Heart size and mediastinal contours are within normal limits. Both lungs are clear.  IMPRESSION: Negative abdominal radiographs. No acute cardiopulmonary disease.  Given 1 liter of IV saline.  Felt better, pulse to 80s.   Assessment and Plan: LLQ pain - Plan: POCT CBC, POCT UA - Microscopic Only, POCT urinalysis dipstick, POCT urine pregnancy, DG Abd Acute W/Chest, Comprehensive metabolic panel  Kayla Little is here today with LLQ pain, leukocytosis and diarrhea.  Discussed with her in detail.  Possible diagnoses include gastroenteritis, atypical appendicitis, diverticulitis.  Doubt ovarian pathology due to her GI sx.  Discussed doing a CT scan- benefits include discovery of possible  serious etiology before it gets worse.  Risks include radiation and expense.  After her IVF she was feeling better-  Tenderness much decreased.  At this time she prefers to go home and recheck tomorrow.  If she gets worse overnight she will go to the ER.  She knows she can call me at anytime with concerns.    Signed Abbe Amsterdam, MD

## 2012-12-05 ENCOUNTER — Ambulatory Visit (INDEPENDENT_AMBULATORY_CARE_PROVIDER_SITE_OTHER): Payer: 59 | Admitting: Family Medicine

## 2012-12-05 VITALS — BP 102/70 | HR 86 | Temp 98.5°F | Resp 18 | Wt 137.0 lb

## 2012-12-05 DIAGNOSIS — D7589 Other specified diseases of blood and blood-forming organs: Secondary | ICD-10-CM

## 2012-12-05 DIAGNOSIS — R1032 Left lower quadrant pain: Secondary | ICD-10-CM

## 2012-12-05 LAB — POCT CBC
Granulocyte percent: 56.7 %G (ref 37–80)
HCT, POC: 41.2 % (ref 37.7–47.9)
Hemoglobin: 13.1 g/dL (ref 12.2–16.2)
Lymph, poc: 2.1 (ref 0.6–3.4)
MCH, POC: 33 pg — AB (ref 27–31.2)
MCHC: 31.8 g/dL (ref 31.8–35.4)
MCV: 103.8 fL — AB (ref 80–97)
MID (cbc): 0.6 (ref 0–0.9)
MPV: 9 fL (ref 0–99.8)
POC Granulocyte: 3.5 (ref 2–6.9)
POC LYMPH PERCENT: 34.1 %L (ref 10–50)
POC MID %: 9.2 %M (ref 0–12)
Platelet Count, POC: 281 10*3/uL (ref 142–424)
RBC: 3.97 M/uL — AB (ref 4.04–5.48)
RDW, POC: 13.2 %
WBC: 6.2 10*3/uL (ref 4.6–10.2)

## 2012-12-05 NOTE — Patient Instructions (Addendum)
Let me know if you do not continue to feel better.  You may continue to have some diarrhea especially after eating for the next few days.   Please have a recheck of your blood count in about one month- you can have this done as a lab visit only

## 2012-12-05 NOTE — Progress Notes (Signed)
[facility] [address] [place] [address] [phone]  Date:  12/05/2012   Name:  DRAYA FELKER   DOB:  1985-11-10   MRN:  [id number]  PCP:  Harrington Challenger, NP    Chief Complaint: Follow-up   History of Present Illness:  Kayla Little is a 27 y.o. very pleasant female patient who presents with the following:  Here for a recheck today.  Overall she is much better. She did get hungry last night and ate some soup.  This did seem to trigger some cramping.  She has had some diarrhea still. She has been drinking water.    Last night she had a temp to 99.9 and took advil.  She took advil around 9:30 last night. No fever since.    There are no active problems to display for this patient.   Past Medical History  Diagnosis Date  . ADD (attention deficit disorder)   . Kidney stone 2012    History reviewed. No pertinent past surgical history.  History  Substance Use Topics  . Smoking status: Current Some Day Smoker -- 1.00 packs/day for 5 years    Types: Cigarettes  . Smokeless tobacco: Not on file  . Alcohol Use: Yes    Family History  Problem Relation Age of Onset  . Breast cancer Mother 22    2 types of cancer one side mastectomy with chemo  . Endometriosis Mother   . Breast cancer Maternal Grandmother   . Endometriosis Maternal Grandmother   . Cancer Maternal Grandfather     brain    No Known Allergies  Medication list has been reviewed and updated.  Current Outpatient Prescriptions on File Prior to Visit  Medication Sig Dispense Refill  . lisdexamfetamine (VYVANSE) 60 MG capsule Take 60 mg by mouth every morning.       No current facility-administered medications on file prior to visit.    Review of Systems:  As per HPI- otherwise negative.   Physical Examination: Filed Vitals:   12/05/12 1416  BP: 102/70  Pulse: 86  Temp: 98.5 F (36.9 C)  Resp: 18   Filed Vitals:   12/05/12 1416  Weight: 137 lb (62.143 kg)   Body mass  index is 21.78 kg/(m^2). Ideal Body Weight:    GEN: WDWN, NAD, Non-toxic, A & O x 3, looks well HEENT: Atraumatic, Normocephalic. Neck supple. No masses, No LAD. Ears and Nose: No external deformity. CV: RRR, No M/G/R. No JVD. No thrill. No extra heart sounds. PULM: CTA B, no wheezes, crackles, rhonchi. No retractions. No resp. distress. No accessory muscle use. ABD: S,  ND, +BS. No rebound. No HSM.  Minimal to no tenderness in the epigastric area.  LLQ tenderness is resolved.   EXTR: No c/c/e NEURO Normal gait.  PSYCH: Normally interactive. Conversant. Not depressed or anxious appearing.  Calm demeanor.   Results for orders placed in visit on 12/05/12  POCT CBC      Result Value Range   WBC 6.2  4.6 - 10.2 K/uL   Lymph, poc 2.1  0.6 - 3.4   POC LYMPH PERCENT 34.1  10 - 50 %L   MID (cbc) 0.6  0 - 0.9   POC MID % 9.2  0 - 12 %M   POC Granulocyte 3.5  2 - 6.9   Granulocyte percent 56.7  37 - 80 %G   RBC 3.97 (*) 4.04 - 5.48 M/uL   Hemoglobin 13.1  12.2 -  16.2 g/dL   HCT, POC 16.1  09.6 - 47.9 %   MCV 103.8 (*) 80 - 97 fL   MCH, POC 33.0 (*) 27 - 31.2 pg   MCHC 31.8  31.8 - 35.4 g/dL   RDW, POC 04.5     Platelet Count, POC 281  142 - 424 K/uL   MPV 9.0  0 - 99.8 fL     Assessment and Plan: LLQ pain - Plan: POCT CBC  Macrocytosis - Plan: POCT CBC  LLQ pain, leukocytosis, vomiting and diarrhea.  All improved today.  As long as she continues to improve nothing further is necessary.  Follow-up CBC only in one month to recheck elevated MCV.  If any problems, no continuing to improve, etc she will call or RTC  Signed Abbe Amsterdam, MD

## 2013-09-18 ENCOUNTER — Ambulatory Visit (INDEPENDENT_AMBULATORY_CARE_PROVIDER_SITE_OTHER): Payer: 59 | Admitting: Women's Health

## 2013-09-18 ENCOUNTER — Encounter: Payer: Self-pay | Admitting: Women's Health

## 2013-09-18 VITALS — BP 120/70 | Ht 66.0 in | Wt 135.0 lb

## 2013-09-18 DIAGNOSIS — N926 Irregular menstruation, unspecified: Secondary | ICD-10-CM

## 2013-09-18 DIAGNOSIS — Z01419 Encounter for gynecological examination (general) (routine) without abnormal findings: Secondary | ICD-10-CM

## 2013-09-18 LAB — CBC WITH DIFFERENTIAL/PLATELET
Basophils Absolute: 0 10*3/uL (ref 0.0–0.1)
Basophils Relative: 0 % (ref 0–1)
Eosinophils Absolute: 0.1 10*3/uL (ref 0.0–0.7)
Eosinophils Relative: 1 % (ref 0–5)
HCT: 37.8 % (ref 36.0–46.0)
Hemoglobin: 13 g/dL (ref 12.0–15.0)
Lymphocytes Relative: 27 % (ref 12–46)
Lymphs Abs: 2.9 10*3/uL (ref 0.7–4.0)
MCH: 32.2 pg (ref 26.0–34.0)
MCHC: 34.4 g/dL (ref 30.0–36.0)
MCV: 93.6 fL (ref 78.0–100.0)
Monocytes Absolute: 0.8 10*3/uL (ref 0.1–1.0)
Monocytes Relative: 7 % (ref 3–12)
Neutro Abs: 7.1 10*3/uL (ref 1.7–7.7)
Neutrophils Relative %: 65 % (ref 43–77)
Platelets: 380 10*3/uL (ref 150–400)
RBC: 4.04 MIL/uL (ref 3.87–5.11)
RDW: 12.7 % (ref 11.5–15.5)
WBC: 10.9 10*3/uL — ABNORMAL HIGH (ref 4.0–10.5)

## 2013-09-18 NOTE — Patient Instructions (Signed)

## 2013-09-18 NOTE — Progress Notes (Signed)
Kayla Little 19-Jan-1986 [id number]    History:    Presents for annual exam.  Regular monthly cycle using no contraception for greater than one year without pregnancy- desiring conception. Normal Pap history. Rubella immune. Gardasil  2010. Mother breast cancer age 30 with 2 types of cancer with mastectomy and chemotherapy BRCA negative. History of a kidney stone.  Past medical history, past surgical history, family history and social history were all reviewed and documented in the EPIC chart. Works at [employer].   ROS:  A  12 point ROS was performed and pertinent positives and negatives are included.  Exam:  Filed Vitals:   09/18/13 1512  BP: 120/70    General appearance:  Normal Thyroid:  Symmetrical, normal in size, without palpable masses or nodularity. Respiratory  Auscultation:  Clear without wheezing or rhonchi Cardiovascular  Auscultation:  Regular rate, without rubs, murmurs or gallops  Edema/varicosities:  Not grossly evident Abdominal  Soft,nontender, without masses, guarding or rebound.  Liver/spleen:  No organomegaly noted  Hernia:  None appreciated  Skin  Inspection:  Grossly normal   Breasts: Examined lying and sitting.     Right: Without masses, retractions, discharge or axillary adenopathy.     Left: Without masses, retractions, discharge or axillary adenopathy. Gentitourinary   Inguinal/mons:  Normal without inguinal adenopathy  External genitalia:  Normal  BUS/Urethra/Skene's glands:  Normal  Vagina:  Normal  Cervix:  Normal  Uterus:   normal in size, shape and contour.  Midline and mobile  Adnexa/parametria:     Rt: Without masses or tenderness.   Lt: Without masses or tenderness.  Anus and perineum: Normal  Digital rectal exam: Normal sphincter tone without palpated masses or tenderness  Assessment/Plan:  28 y.o.MWF G0  for annual exam tiring conception  Unprotected intercourse greater than one year with no pregnancy Dysmenorrhea day one of  cycle  Plan: Ovulation predictors reviewed, on day 4 of cycle will check estradiol, FSH, TSH, prolactin, CBC, UA, Pap normal 2014, new screening guidelines reviewed. Reviewed if all labs normal proceed to semen analysis, hysterosalpingogram reviewed, progesterone day 23. Repeat we no longer deliver and will transfer care to infertility specialists. SBE's, regular exercise, calcium rich diet, prenatal vitamin daily encouraged. Note: This dictation was prepared with Dragon/digital dictation.  Any transcriptional errors that result are unintentional. [place], 5:03 PM 09/18/2013

## 2013-09-19 LAB — URINALYSIS W MICROSCOPIC + REFLEX CULTURE
Bilirubin Urine: NEGATIVE
Casts: NONE SEEN
Crystals: NONE SEEN
Glucose, UA: NEGATIVE mg/dL
Hgb urine dipstick: NEGATIVE
Ketones, ur: NEGATIVE mg/dL
Leukocytes, UA: NEGATIVE
Nitrite: NEGATIVE
Protein, ur: NEGATIVE mg/dL
Specific Gravity, Urine: 1.014 (ref 1.005–1.030)
Squamous Epithelial / LPF: NONE SEEN
Urobilinogen, UA: 0.2 mg/dL (ref 0.0–1.0)
pH: 8 (ref 5.0–8.0)

## 2013-09-19 LAB — TSH: TSH: 0.607 u[IU]/mL (ref 0.350–4.500)

## 2013-09-20 ENCOUNTER — Other Ambulatory Visit: Payer: Self-pay | Admitting: Women's Health

## 2013-09-20 DIAGNOSIS — N926 Irregular menstruation, unspecified: Secondary | ICD-10-CM

## 2013-09-20 LAB — ESTRADIOL: Estradiol: 22.9 pg/mL

## 2013-09-20 LAB — FOLLICLE STIMULATING HORMONE: FSH: 10 m[IU]/mL

## 2013-09-20 LAB — PROLACTIN: Prolactin: 8.5 ng/mL

## 2013-10-08 ENCOUNTER — Other Ambulatory Visit: Payer: 59

## 2014-02-12 ENCOUNTER — Encounter: Payer: Self-pay | Admitting: Women's Health

## 2014-02-12 ENCOUNTER — Ambulatory Visit (INDEPENDENT_AMBULATORY_CARE_PROVIDER_SITE_OTHER): Payer: 59 | Admitting: Women's Health

## 2014-02-12 DIAGNOSIS — Z3201 Encounter for pregnancy test, result positive: Secondary | ICD-10-CM

## 2014-02-12 LAB — PREGNANCY, URINE: Preg Test, Ur: POSITIVE

## 2014-02-12 NOTE — Progress Notes (Signed)
Patient ID: Kayla Little, female   DOB: [birth year], 28 y.o.   MRN: [id number] Presents with home positive pregnancy test. Denies abdominal pain, bleeding, vaginal discharge or urinary symptoms. Having some nausea. LMP October 25, normal time/normal cycle. Desired pregnancy. Rubella positive. Smoking 2-3 cigarettes daily.  Exam: Appears well. Positive UPT  Early pregnancy/6 weeks per dates  Plan: Schedule viability/dating ultrasound next week. Continue prenatal vitamins daily. Safe pregnancy behaviors reviewed. Over-the-counter Unisom with B12 for nausea.

## 2014-02-12 NOTE — Patient Instructions (Signed)

## 2014-02-20 ENCOUNTER — Other Ambulatory Visit: Payer: Self-pay | Admitting: Women's Health

## 2014-02-20 ENCOUNTER — Ambulatory Visit (INDEPENDENT_AMBULATORY_CARE_PROVIDER_SITE_OTHER): Payer: 59 | Admitting: Women's Health

## 2014-02-20 ENCOUNTER — Encounter: Payer: Self-pay | Admitting: Women's Health

## 2014-02-20 ENCOUNTER — Ambulatory Visit (INDEPENDENT_AMBULATORY_CARE_PROVIDER_SITE_OTHER): Payer: 59

## 2014-02-20 VITALS — BP 118/70 | Ht 66.0 in | Wt 135.0 lb

## 2014-02-20 DIAGNOSIS — N912 Amenorrhea, unspecified: Secondary | ICD-10-CM

## 2014-02-20 DIAGNOSIS — O3680X0 Pregnancy with inconclusive fetal viability, not applicable or unspecified: Secondary | ICD-10-CM

## 2014-02-20 DIAGNOSIS — Z3689 Encounter for other specified antenatal screening: Secondary | ICD-10-CM

## 2014-02-20 DIAGNOSIS — Z3201 Encounter for pregnancy test, result positive: Secondary | ICD-10-CM

## 2014-02-20 DIAGNOSIS — Z36 Encounter for antenatal screening of mother: Secondary | ICD-10-CM

## 2014-02-20 LAB — US OB TRANSVAGINAL

## 2014-02-20 NOTE — Progress Notes (Signed)
Patient ID: Kayla Little, female   DOB: September 23, 1985, 28 y.o.   MRN: [id number] Presents for viability ultrasound. Denies vaginal discharge, urinary symptoms, abdominal pain or bleeding. Taking prenatal vitamin daily.  Ultrasound: Transvaginal anteflexed uterus IUP seen in normal position. Fetal heart motion 132. Fetal pole seen size less than dates by 5 days. Crown-rump length 6 weeks 4 days, right ovary corpus luteum cyst. Left ovary normal. Negative cul-de-sac. Cervix long and closed.  Early pregnancy  Plan: Copy of ultrasound and 2014 Pap smear/normal given to take to first prenatal appointment. Instructed to schedule. Continue prenatal vitamins. Safe pregnancy behaviors reviewed. Is down to 2 cigarettes daily Will continue to decrease and quit. Congratulations given.

## 2014-02-20 NOTE — Patient Instructions (Signed)
First Trimester of Pregnancy The first trimester of pregnancy is from week 1 until the end of week 12 (months 1 through 3). A week after a sperm fertilizes an egg, the egg will implant on the wall of the uterus. This embryo will begin to develop into a baby. Genes from you and your partner are forming the baby. The female genes determine whether the baby is a boy or a girl. At 6-8 weeks, the eyes and face are formed, and the heartbeat can be seen on ultrasound. At the end of 12 weeks, all the baby's organs are formed.  Now that you are pregnant, you will want to do everything you can to have a healthy baby. Two of the most important things are to get good prenatal care and to follow your health care provider's instructions. Prenatal care is all the medical care you receive before the baby's birth. This care will help prevent, find, and treat any problems during the pregnancy and childbirth. BODY CHANGES Your body goes through many changes during pregnancy. The changes vary from woman to woman.   You may gain or lose a couple of pounds at first.  You may feel sick to your stomach (nauseous) and throw up (vomit). If the vomiting is uncontrollable, call your health care provider.  You may tire easily.  You may develop headaches that can be relieved by medicines approved by your health care provider.  You may urinate more often. Painful urination may mean you have a bladder infection.  You may develop heartburn as a result of your pregnancy.  You may develop constipation because certain hormones are causing the muscles that push waste through your intestines to slow down.  You may develop hemorrhoids or swollen, bulging veins (varicose veins).  Your breasts may begin to grow larger and become tender. Your nipples may stick out more, and the tissue that surrounds them (areola) may become darker.  Your gums may bleed and may be sensitive to brushing and flossing.  Dark spots or blotches (chloasma,  mask of pregnancy) may develop on your face. This will likely fade after the baby is born.  Your menstrual periods will stop.  You may have a loss of appetite.  You may develop cravings for certain kinds of food.  You may have changes in your emotions from day to day, such as being excited to be pregnant or being concerned that something may go wrong with the pregnancy and baby.  You may have more vivid and strange dreams.  You may have changes in your hair. These can include thickening of your hair, rapid growth, and changes in texture. Some women also have hair loss during or after pregnancy, or hair that feels dry or thin. Your hair will most likely return to normal after your baby is born. WHAT TO EXPECT AT YOUR PRENATAL VISITS During a routine prenatal visit:  You will be weighed to make sure you and the baby are growing normally.  Your blood pressure will be taken.  Your abdomen will be measured to track your baby's growth.  The fetal heartbeat will be listened to starting around week 10 or 12 of your pregnancy.  Test results from any previous visits will be discussed. Your health care provider may ask you:  How you are feeling.  If you are feeling the baby move.  If you have had any abnormal symptoms, such as leaking fluid, bleeding, severe headaches, or abdominal cramping.  If you have any questions. Other tests   that may be performed during your first trimester include:  Blood tests to find your blood type and to check for the presence of any previous infections. They will also be used to check for low iron levels (anemia) and Rh antibodies. Later in the pregnancy, blood tests for diabetes will be done along with other tests if problems develop.  Urine tests to check for infections, diabetes, or protein in the urine.  An ultrasound to confirm the proper growth and development of the baby.  An amniocentesis to check for possible genetic problems.  Fetal screens for  spina bifida and Down syndrome.  You may need other tests to make sure you and the baby are doing well. HOME CARE INSTRUCTIONS  Medicines  Follow your health care provider's instructions regarding medicine use. Specific medicines may be either safe or unsafe to take during pregnancy.  Take your prenatal vitamins as directed.  If you develop constipation, try taking a stool softener if your health care provider approves. Diet  Eat regular, well-balanced meals. Choose a variety of foods, such as meat or vegetable-based protein, fish, milk and low-fat dairy products, vegetables, fruits, and whole grain breads and cereals. Your health care provider will help you determine the amount of weight gain that is right for you.  Avoid raw meat and uncooked cheese. These carry germs that can cause birth defects in the baby.  Eating four or five small meals rather than three large meals a day may help relieve nausea and vomiting. If you start to feel nauseous, eating a few soda crackers can be helpful. Drinking liquids between meals instead of during meals also seems to help nausea and vomiting.  If you develop constipation, eat more high-fiber foods, such as fresh vegetables or fruit and whole grains. Drink enough fluids to keep your urine clear or pale yellow. Activity and Exercise  Exercise only as directed by your health care provider. Exercising will help you:  Control your weight.  Stay in shape.  Be prepared for labor and delivery.  Experiencing pain or cramping in the lower abdomen or low back is a good sign that you should stop exercising. Check with your health care provider before continuing normal exercises.  Try to avoid standing for long periods of time. Move your legs often if you must stand in one place for a long time.  Avoid heavy lifting.  Wear low-heeled shoes, and practice good posture.  You may continue to have sex unless your health care provider directs you  otherwise. Relief of Pain or Discomfort  Wear a good support bra for breast tenderness.   Take warm sitz baths to soothe any pain or discomfort caused by hemorrhoids. Use hemorrhoid cream if your health care provider approves.   Rest with your legs elevated if you have leg cramps or low back pain.  If you develop varicose veins in your legs, wear support hose. Elevate your feet for 15 minutes, 3-4 times a day. Limit salt in your diet. Prenatal Care  Schedule your prenatal visits by the twelfth week of pregnancy. They are usually scheduled monthly at first, then more often in the last 2 months before delivery.  Write down your questions. Take them to your prenatal visits.  Keep all your prenatal visits as directed by your health care provider. Safety  Wear your seat belt at all times when driving.  Make a list of emergency phone numbers, including numbers for family, friends, the hospital, and police and fire departments. General Tips    Ask your health care provider for a referral to a local prenatal education class. Begin classes no later than at the beginning of month 6 of your pregnancy.  Ask for help if you have counseling or nutritional needs during pregnancy. Your health care provider can offer advice or refer you to specialists for help with various needs.  Do not use hot tubs, steam rooms, or saunas.  Do not douche or use tampons or scented sanitary pads.  Do not cross your legs for long periods of time.  Avoid cat litter boxes and soil used by cats. These carry germs that can cause birth defects in the baby and possibly loss of the fetus by miscarriage or stillbirth.  Avoid all smoking, herbs, alcohol, and medicines not prescribed by your health care provider. Chemicals in these affect the formation and growth of the baby.  Schedule a dentist appointment. At home, brush your teeth with a soft toothbrush and be gentle when you floss. SEEK MEDICAL CARE IF:   You have  dizziness.  You have mild pelvic cramps, pelvic pressure, or nagging pain in the abdominal area.  You have persistent nausea, vomiting, or diarrhea.  You have a bad smelling vaginal discharge.  You have pain with urination.  You notice increased swelling in your face, hands, legs, or ankles. SEEK IMMEDIATE MEDICAL CARE IF:   You have a fever.  You are leaking fluid from your vagina.  You have spotting or bleeding from your vagina.  You have severe abdominal cramping or pain.  You have rapid weight gain or loss.  You vomit blood or material that looks like coffee grounds.  You are exposed to German measles and have never had them.  You are exposed to fifth disease or chickenpox.  You develop a severe headache.  You have shortness of breath.  You have any kind of trauma, such as from a fall or a car accident. Document Released: 02/16/2001 Document Revised: 07/09/2013 Document Reviewed: 01/02/2013 Exit[employer] Patient Information 2015 [employer]. This information is not intended to replace advice given to you by your health care provider. Make sure you discuss any questions you have with your health care provider.  

## 2014-02-24 ENCOUNTER — Telehealth: Payer: Self-pay | Admitting: Obstetrics & Gynecology

## 2014-02-24 NOTE — Telephone Encounter (Signed)
Pt called with questions about safe medications in pregnancy due to having a cold.  Pt has PUS 12/18 with singleton IUD at 6 4/7.  No bleeding.  No pain.  Just congestion, sinus pressure, mild cough.    Tyleno lOTC  products, benadryl, robitussin DM all discussed with pt.  Advised against herbal remedies as these can have products that haven't really been investigated in pregnancy.  Pt thankful for phone call.

## 2014-02-25 NOTE — Telephone Encounter (Signed)
Message left to call if continued problems.

## 2014-02-26 ENCOUNTER — Telehealth: Payer: Self-pay | Admitting: Gynecology

## 2014-02-26 LAB — OB RESULTS CONSOLE ANTIBODY SCREEN: Antibody Screen: NEGATIVE

## 2014-02-26 LAB — OB RESULTS CONSOLE ABO/RH: RH Type: NEGATIVE

## 2014-02-26 LAB — OB RESULTS CONSOLE HIV ANTIBODY (ROUTINE TESTING): HIV: NONREACTIVE

## 2014-02-26 LAB — OB RESULTS CONSOLE RPR: RPR: NONREACTIVE

## 2014-02-26 LAB — OB RESULTS CONSOLE RUBELLA ANTIBODY, IGM: Rubella: IMMUNE

## 2014-02-26 LAB — OB RESULTS CONSOLE HEPATITIS B SURFACE ANTIGEN: Hepatitis B Surface Ag: NEGATIVE

## 2014-02-26 NOTE — Telephone Encounter (Signed)
On Call Note:  Answering service notified me that patient called earlier and never received call back. I called patient and left message on answering machine to call me if still needed.

## 2014-02-27 ENCOUNTER — Telehealth: Payer: Self-pay | Admitting: *Deleted

## 2014-02-27 NOTE — Telephone Encounter (Signed)
I left message for pt to call as directed the below.

## 2014-02-27 NOTE — Telephone Encounter (Signed)
-----   Message from Anastasio Auerbach, MD sent at 02/27/2014  7:48 AM EST ----- Patient paged me last night. When I called I got her answering machine. Check to see if she is still having an issue.

## 2014-03-08 NOTE — L&D Delivery Note (Signed)
Operative Delivery Note At 9:54 PM a viable and healthy female was delivered via Vaginal, Vacuum Neurosurgeon).  Presentation: vertex; Position: Occiput,, Anterior; Station: +4.  Verbal consent: obtained from patient.  Risks and benefits discussed in detail.  Risks include, but are not limited to the risks of anesthesia, bleeding, infection, damage to maternal tissues, fetal cephalhematoma.  There is also the risk of inability to effect vaginal delivery of the head, or shoulder dystocia that cannot be resolved by established maneuvers, leading to the need for emergency cesarean section.  APGAR: 6, 9; weight  .   Placenta status: Intact, Spontaneous.   Cord: 3 vessels with the following complications: None.  Cord pH: na Mild shoulder dystocia relieved with McRoberts and delivery of posterior arm  Anesthesia: Epidural  Instruments: kiwi x one pull, no pop offs Episiotomy: None Lacerations: 2nd degree;Perineal Suture Repair: 2.0 3.0 vicryl rapide Est. Blood Loss (mL):  450  Mom to postpartum.  Baby to Couplet care / Skin to Skin.  Wileen Duncanson J 10/02/2014, 10:12 PM

## 2014-03-14 LAB — OB RESULTS CONSOLE GC/CHLAMYDIA
Chlamydia: NEGATIVE
Gonorrhea: NEGATIVE

## 2014-09-03 LAB — OB RESULTS CONSOLE GBS: GBS: NEGATIVE

## 2014-09-25 ENCOUNTER — Telehealth (HOSPITAL_COMMUNITY): Payer: Self-pay | Admitting: *Deleted

## 2014-09-25 ENCOUNTER — Encounter (HOSPITAL_COMMUNITY): Payer: Self-pay | Admitting: *Deleted

## 2014-09-25 NOTE — Telephone Encounter (Signed)
Preadmission screen  

## 2014-09-30 ENCOUNTER — Ambulatory Visit (INDEPENDENT_AMBULATORY_CARE_PROVIDER_SITE_OTHER): Payer: Self-pay | Admitting: Pediatrics

## 2014-09-30 DIAGNOSIS — Z7681 Expectant parent(s) prebirth pediatrician visit: Secondary | ICD-10-CM

## 2014-09-30 NOTE — Progress Notes (Signed)
Prenatal counseling for impending newborn done-- Z76.81  

## 2014-10-01 ENCOUNTER — Other Ambulatory Visit: Payer: Self-pay | Admitting: Obstetrics and Gynecology

## 2014-10-01 NOTE — H&P (Signed)
Kayla Little is a 29 y.o. female presenting for IOL for moderate polyhydramnios.  Maternal Medical History:  Contractions: Onset was less than 1 hour ago.   Frequency: rare.   Perceived severity is mild.    Fetal activity: Perceived fetal activity is normal.   Last perceived fetal movement was within the past hour.    Prenatal complications: Polyhydramnios.   Prenatal Complications - Diabetes: none.    OB History    Gravida Para Term Preterm AB TAB SAB Ectopic Multiple Living   1              Past Medical History  Diagnosis Date  . ADD (attention deficit disorder)   . Kidney stone 2012  . Breast lump     R breast  . Hx of varicella    Past Surgical History  Procedure Laterality Date  . No past surgeries     Family History: family history includes Alzheimer's disease in her paternal grandmother; Breast cancer in her maternal grandmother; Breast cancer (age of onset: 75) in her mother; Cancer in her maternal grandfather; Diabetes in her maternal grandmother; Endometriosis in her maternal grandmother and mother. Social History:  reports that she has been smoking Cigarettes.  She has a 1.25 pack-year smoking history. She has never used smokeless tobacco. She reports that she drinks about 3.5 oz of alcohol per week. She reports that she does not use illicit drugs.   Prenatal Transfer Tool  Maternal Diabetes: No Genetic Screening: Normal Maternal Ultrasounds/Referrals: Abnormal:  Findings:   Other: Fetal Ultrasounds or other Referrals:  None Maternal Substance Abuse:  No Significant Maternal Medications:  None Significant Maternal Lab Results:  None Other Comments:  None  Review of Systems  Constitutional: Negative.   All other systems reviewed and are negative.     Last menstrual period 12/31/2013. Maternal Exam:  Uterine Assessment: Contraction strength is mild.  Contraction frequency is rare.   Abdomen: Patient reports no abdominal tenderness. Fetal presentation:  vertex  Introitus: Normal vulva. Normal vagina.  Ferning test: not done.  Nitrazine test: not done. Amniotic fluid character: not assessed.  Pelvis: questionable for delivery.   Cervix: Cervix evaluated by digital exam.     Physical Exam  Nursing note and vitals reviewed. Constitutional: She is oriented to person, place, and time. She appears well-developed and well-nourished.  HENT:  Head: Normocephalic and atraumatic.  Neck: Normal range of motion. Neck supple.  Cardiovascular: Normal rate and regular rhythm.   Respiratory: Effort normal and breath sounds normal.  GI: Soft. Bowel sounds are normal.  Genitourinary: Vagina normal and uterus normal.  Musculoskeletal: Normal range of motion.  Neurological: She is alert and oriented to person, place, and time. She has normal reflexes.  Skin: Skin is warm and dry.  Psychiatric: She has a normal mood and affect.    Prenatal labs: ABO, Rh: O/Negative/-- (12/22 0000) Antibody: Negative (12/22 0000) Rubella: Immune (12/22 0000) RPR: Nonreactive (12/22 0000)  HBsAg: Negative (12/22 0000)  HIV: Non-reactive (12/22 0000)  GBS: Negative (06/28 0000)   Assessment/Plan: Moderate polyhydramnios 40 weeks IOL   Kayla Little J 10/01/2014, 10:55 PM

## 2014-10-02 ENCOUNTER — Encounter (HOSPITAL_COMMUNITY): Payer: Self-pay

## 2014-10-02 ENCOUNTER — Inpatient Hospital Stay (HOSPITAL_COMMUNITY): Payer: Commercial Managed Care - HMO | Admitting: Anesthesiology

## 2014-10-02 ENCOUNTER — Inpatient Hospital Stay (HOSPITAL_COMMUNITY)
Admission: AD | Admit: 2014-10-02 | Discharge: 2014-10-04 | DRG: 775 | Disposition: A | Discharge status: Home / Self Care | Payer: Commercial Managed Care - HMO | Source: Ambulatory Visit | Attending: Obstetrics and Gynecology | Admitting: Obstetrics and Gynecology

## 2014-10-02 DIAGNOSIS — Z6791 Unspecified blood type, Rh negative: Secondary | ICD-10-CM | POA: Diagnosis not present

## 2014-10-02 DIAGNOSIS — Z6741 Type O blood, Rh negative: Secondary | ICD-10-CM

## 2014-10-02 DIAGNOSIS — Z3A39 39 weeks gestation of pregnancy: Secondary | ICD-10-CM | POA: Diagnosis present

## 2014-10-02 DIAGNOSIS — O26893 Other specified pregnancy related conditions, third trimester: Secondary | ICD-10-CM | POA: Diagnosis present

## 2014-10-02 DIAGNOSIS — O403XX0 Polyhydramnios, third trimester, not applicable or unspecified: Principal | ICD-10-CM | POA: Diagnosis present

## 2014-10-02 DIAGNOSIS — O409XX0 Polyhydramnios, unspecified trimester, not applicable or unspecified: Secondary | ICD-10-CM | POA: Diagnosis present

## 2014-10-02 LAB — CBC
HCT: 35.5 % — ABNORMAL LOW (ref 36.0–46.0)
Hemoglobin: 12 g/dL (ref 12.0–15.0)
MCH: 31.9 pg (ref 26.0–34.0)
MCHC: 33.8 g/dL (ref 30.0–36.0)
MCV: 94.4 fL (ref 78.0–100.0)
Platelets: 228 10*3/uL (ref 150–400)
RBC: 3.76 MIL/uL — ABNORMAL LOW (ref 3.87–5.11)
RDW: 14.7 % (ref 11.5–15.5)
WBC: 13.4 10*3/uL — ABNORMAL HIGH (ref 4.0–10.5)

## 2014-10-02 LAB — RPR: RPR Ser Ql: NONREACTIVE

## 2014-10-02 MED ORDER — LACTATED RINGERS IV SOLN
500.0000 mL | INTRAVENOUS | Status: DC | PRN
  Administered 2014-10-02: 500 mL via INTRAVENOUS

## 2014-10-02 MED ORDER — OXYTOCIN 40 UNITS IN LACTATED RINGERS INFUSION - SIMPLE MED
1.0000 m[IU]/min | INTRAVENOUS | Status: DC
  Administered 2014-10-02: 2 m[IU]/min via INTRAVENOUS

## 2014-10-02 MED ORDER — TERBUTALINE SULFATE 1 MG/ML IJ SOLN
0.2500 mg | Freq: Once | INTRAMUSCULAR | Status: AC | PRN
  Filled 2014-10-02: qty 1

## 2014-10-02 MED ORDER — PHENYLEPHRINE 40 MCG/ML (10ML) SYRINGE FOR IV PUSH (FOR BLOOD PRESSURE SUPPORT)
PREFILLED_SYRINGE | INTRAVENOUS | Status: AC
  Administered 2014-10-02: 80 ug via INTRAVENOUS
  Filled 2014-10-02: qty 20

## 2014-10-02 MED ORDER — ACETAMINOPHEN 325 MG PO TABS: 650.0000 mg | ORAL_TABLET | ORAL | Status: DC | PRN

## 2014-10-02 MED ORDER — MISOPROSTOL 25 MCG QUARTER TABLET
25.0000 ug | ORAL_TABLET | ORAL | Status: DC | PRN
  Administered 2014-10-02: 25 ug via VAGINAL
  Filled 2014-10-02: qty 1
  Filled 2014-10-02: qty 0.25

## 2014-10-02 MED ORDER — CITRIC ACID-SODIUM CITRATE 334-500 MG/5ML PO SOLN: 30.0000 mL | ORAL | Status: DC | PRN

## 2014-10-02 MED ORDER — OXYCODONE-ACETAMINOPHEN 5-325 MG PO TABS: 2.0000 | ORAL_TABLET | ORAL | Status: DC | PRN

## 2014-10-02 MED ORDER — ONDANSETRON HCL 4 MG/2ML IJ SOLN: 4.0000 mg | Freq: Four times a day (QID) | INTRAMUSCULAR | Status: DC | PRN

## 2014-10-02 MED ORDER — ZOLPIDEM TARTRATE 5 MG PO TABS: 5.0000 mg | ORAL_TABLET | Freq: Every evening | ORAL | Status: DC | PRN

## 2014-10-02 MED ORDER — PHENYLEPHRINE 40 MCG/ML (10ML) SYRINGE FOR IV PUSH (FOR BLOOD PRESSURE SUPPORT)
80.0000 ug | PREFILLED_SYRINGE | INTRAVENOUS | Status: DC | PRN
  Administered 2014-10-02 (×2): 80 ug via INTRAVENOUS
  Filled 2014-10-02: qty 2

## 2014-10-02 MED ORDER — BUTORPHANOL TARTRATE 1 MG/ML IJ SOLN
1.0000 mg | Freq: Once | INTRAMUSCULAR | Status: AC
  Administered 2014-10-02: 1 mg via INTRAVENOUS
  Filled 2014-10-02: qty 1

## 2014-10-02 MED ORDER — OXYTOCIN 40 UNITS IN LACTATED RINGERS INFUSION - SIMPLE MED
62.5000 mL/h | INTRAVENOUS | Status: DC
  Filled 2014-10-02: qty 1000

## 2014-10-02 MED ORDER — DIPHENHYDRAMINE HCL 50 MG/ML IJ SOLN: 12.5000 mg | INTRAMUSCULAR | Status: DC | PRN

## 2014-10-02 MED ORDER — FENTANYL 2.5 MCG/ML BUPIVACAINE 1/10 % EPIDURAL INFUSION (WH - ANES)
14.0000 mL/h | INTRAMUSCULAR | Status: DC | PRN
  Administered 2014-10-02 (×4): 14 mL/h via EPIDURAL
  Filled 2014-10-02 (×2): qty 125

## 2014-10-02 MED ORDER — PROMETHAZINE HCL 25 MG/ML IJ SOLN
12.5000 mg | Freq: Once | INTRAMUSCULAR | Status: AC
  Administered 2014-10-02: 12.5 mg via INTRAVENOUS
  Filled 2014-10-02: qty 1

## 2014-10-02 MED ORDER — PHENYLEPHRINE 40 MCG/ML (10ML) SYRINGE FOR IV PUSH (FOR BLOOD PRESSURE SUPPORT)
80.0000 ug | PREFILLED_SYRINGE | INTRAVENOUS | Status: DC | PRN
  Administered 2014-10-02: 80 ug via INTRAVENOUS

## 2014-10-02 MED ORDER — FENTANYL 2.5 MCG/ML BUPIVACAINE 1/10 % EPIDURAL INFUSION (WH - ANES)
INTRAMUSCULAR | Status: AC
  Administered 2014-10-02: 14 mL/h via EPIDURAL
  Filled 2014-10-02: qty 125

## 2014-10-02 MED ORDER — FLEET ENEMA 7-19 GM/118ML RE ENEM: 1.0000 | ENEMA | RECTAL | Status: DC | PRN

## 2014-10-02 MED ORDER — EPHEDRINE 5 MG/ML INJ
10.0000 mg | INTRAVENOUS | Status: DC | PRN
  Filled 2014-10-02: qty 2

## 2014-10-02 MED ORDER — OXYTOCIN BOLUS FROM INFUSION: 500.0000 mL | INTRAVENOUS | Status: DC

## 2014-10-02 MED ORDER — LIDOCAINE HCL (PF) 1 % IJ SOLN
INTRAMUSCULAR | Status: DC | PRN
  Administered 2014-10-02 (×2): 4 mL via EPIDURAL

## 2014-10-02 MED ORDER — LIDOCAINE HCL (PF) 1 % IJ SOLN
30.0000 mL | INTRAMUSCULAR | Status: DC | PRN
  Filled 2014-10-02: qty 30

## 2014-10-02 MED ORDER — FENTANYL 2.5 MCG/ML BUPIVACAINE 1/10 % EPIDURAL INFUSION (WH - ANES): 14.0000 mL/h | INTRAMUSCULAR | Status: DC | PRN

## 2014-10-02 MED ORDER — LACTATED RINGERS IV SOLN
INTRAVENOUS | Status: DC
  Administered 2014-10-02 (×4): via INTRAVENOUS

## 2014-10-02 MED ORDER — OXYCODONE-ACETAMINOPHEN 5-325 MG PO TABS: 1.0000 | ORAL_TABLET | ORAL | Status: DC | PRN

## 2014-10-02 NOTE — Anesthesia Procedure Notes (Signed)
Epidural Patient location during procedure: OB Start time: 10/02/2014 9:27 AM  Staffing Anesthesiologist: Josephine Igo Performed by: anesthesiologist   Preanesthetic Checklist Completed: patient identified, site marked, surgical consent, pre-op evaluation, timeout performed, IV checked, risks and benefits discussed and monitors and equipment checked  Epidural Patient position: left lateral decubitus Prep: site prepped and draped and DuraPrep Patient monitoring: continuous pulse ox and blood pressure Approach: midline Location: L4-L5 Injection technique: LOR air  Needle:  Needle type: Tuohy  Needle gauge: 17 G Needle length: 9 cm and 9 Needle insertion depth: 5 cm cm Catheter type: closed end flexible Catheter size: 19 Gauge Catheter at skin depth: 10 cm Test dose: negative and Other  Assessment Events: blood not aspirated, injection not painful, no injection resistance, negative IV test and no paresthesia  Additional Notes Patient identified. Risks and benefits discussed including failed block, incomplete  Pain control, post dural puncture headache, nerve damage, paralysis, blood pressure Changes, nausea, vomiting, reactions to medications-both toxic and allergic and post Partum back pain. All questions were answered. Patient expressed understanding and wished to proceed. Sterile technique was used throughout procedure. Epidural site was Dressed with sterile barrier dressing. No paresthesias, signs of intravascular injection Or signs of intrathecal spread were encountered.  Patient was more comfortable after the epidural was dosed. Please see RN's note for documentation of vital signs and FHR which are stable.

## 2014-10-02 NOTE — Anesthesia Preprocedure Evaluation (Signed)
Anesthesia Evaluation  Patient identified by MRN, date of birth, ID band Patient awake    Reviewed: Allergy & Precautions, H&P , Patient's Chart, lab work & pertinent test results  Airway Mallampati: III  TM Distance: >3 FB Neck ROM: full    Dental no notable dental hx. (+) Teeth Intact   Pulmonary neg pulmonary ROS, Current Smoker,  breath sounds clear to auscultation  Pulmonary exam normal       Cardiovascular negative cardio ROS Normal cardiovascular examRhythm:regular Rate:Normal     Neuro/Psych PSYCHIATRIC DISORDERS Hx/o ADDnegative neurological ROS  negative psych ROS   GI/Hepatic negative GI ROS, Neg liver ROS,   Endo/Other  negative endocrine ROS  Renal/GU Renal diseaseHx/o renal calculi  negative genitourinary   Musculoskeletal   Abdominal   Peds  Hematology negative hematology ROS (+)   Anesthesia Other Findings   Reproductive/Obstetrics (+) Pregnancy                             Anesthesia Physical Anesthesia Plan  ASA: II  Anesthesia Plan: Epidural   Post-op Pain Management:    Induction:   Airway Management Planned:   Additional Equipment:   Intra-op Plan:   Post-operative Plan:   Informed Consent: I have reviewed the patients History and Physical, chart, labs and discussed the procedure including the risks, benefits and alternatives for the proposed anesthesia with the patient or authorized representative who has indicated his/her understanding and acceptance.     Plan Discussed with: Anesthesiologist  Anesthesia Plan Comments:         Anesthesia Quick Evaluation

## 2014-10-02 NOTE — Progress Notes (Signed)
Delivery of live viable female by Dr. Ronita Hipps, [facility] 6,9

## 2014-10-02 NOTE — Progress Notes (Signed)
Kayla Little is a 29 y.o. G1P0 at 109w5d by LMP admitted for induction of labor due to poly.  Subjective: occ pressure  Objective: BP 130/89 mmHg  Pulse 97  Temp(Src) 98.3 F (36.8 C) (Oral)  Resp 18  Ht 5\' 6"  (1.676 m)  Wt 73.029 kg (161 lb)  BMI 26.00 kg/m2  SpO2 98%  LMP 12/31/2013 I/O last 3 completed shifts: In: -  Out: 700 [Urine:700]    FHT:  FHR: 155 bpm, variability: moderate,  accelerations:  Present,  decelerations:  Absent UC:   regular, every 2 minutes SVE:   Dilation: 10 Effacement (%): 100 Station: +2 Exam by:: Dr. Ronita Hipps  Labs: Lab Results  Component Value Date   WBC 13.4* 10/02/2014   HGB 12.0 10/02/2014   HCT 35.5* 10/02/2014   MCV 94.4 10/02/2014   PLT 228 10/02/2014    Assessment / Plan: Induction of labor due to hydramnios,  progressing well on pitocin  Labor: Progressing normally Preeclampsia:  no signs or symptoms of toxicity Fetal Wellbeing:  Category I Pain Control:  Epidural I/D:  n/a Anticipated MOD:  NSVD  Kayla Little 10/02/2014, 8:41 PM

## 2014-10-02 NOTE — Plan of Care (Signed)
Problem: Consults Goal: Birthing Suites Patient Information Press F2 to bring up selections list Outcome: Completed/Met Date Met:  10/02/14  Pt 37-[redacted] weeks EGA and Inpatient induction

## 2014-10-03 LAB — CBC
HCT: 29.9 % — ABNORMAL LOW (ref 36.0–46.0)
Hemoglobin: 10 g/dL — ABNORMAL LOW (ref 12.0–15.0)
MCH: 31.3 pg (ref 26.0–34.0)
MCHC: 33.4 g/dL (ref 30.0–36.0)
MCV: 93.7 fL (ref 78.0–100.0)
Platelets: 207 10*3/uL (ref 150–400)
RBC: 3.19 MIL/uL — ABNORMAL LOW (ref 3.87–5.11)
RDW: 14.7 % (ref 11.5–15.5)
WBC: 31.2 10*3/uL — ABNORMAL HIGH (ref 4.0–10.5)

## 2014-10-03 MED ORDER — PRENATAL MULTIVITAMIN CH
1.0000 | ORAL_TABLET | Freq: Every day | ORAL | Status: DC
  Administered 2014-10-03: 1 via ORAL
  Filled 2014-10-03: qty 1

## 2014-10-03 MED ORDER — SIMETHICONE 80 MG PO CHEW: 80.0000 mg | CHEWABLE_TABLET | ORAL | Status: DC | PRN

## 2014-10-03 MED ORDER — ZOLPIDEM TARTRATE 5 MG PO TABS: 5.0000 mg | ORAL_TABLET | Freq: Every evening | ORAL | Status: DC | PRN

## 2014-10-03 MED ORDER — DIBUCAINE 1 % RE OINT: 1.0000 "application " | TOPICAL_OINTMENT | RECTAL | Status: DC | PRN

## 2014-10-03 MED ORDER — SENNOSIDES-DOCUSATE SODIUM 8.6-50 MG PO TABS
2.0000 | ORAL_TABLET | ORAL | Status: DC
  Administered 2014-10-03: 2 via ORAL
  Filled 2014-10-03: qty 2

## 2014-10-03 MED ORDER — LANOLIN HYDROUS EX OINT: TOPICAL_OINTMENT | CUTANEOUS | Status: DC | PRN

## 2014-10-03 MED ORDER — WITCH HAZEL-GLYCERIN EX PADS: 1.0000 "application " | MEDICATED_PAD | CUTANEOUS | Status: DC | PRN

## 2014-10-03 MED ORDER — OXYCODONE-ACETAMINOPHEN 5-325 MG PO TABS: 2.0000 | ORAL_TABLET | ORAL | Status: DC | PRN

## 2014-10-03 MED ORDER — TETANUS-DIPHTH-ACELL PERTUSSIS 5-2.5-18.5 LF-MCG/0.5 IM SUSP
0.5000 mL | Freq: Once | INTRAMUSCULAR | Status: DC
Start: 2014-10-03 — End: 2014-10-03

## 2014-10-03 MED ORDER — OXYCODONE-ACETAMINOPHEN 5-325 MG PO TABS
1.0000 | ORAL_TABLET | ORAL | Status: DC | PRN
  Administered 2014-10-03 – 2014-10-04 (×5): 1 via ORAL
  Filled 2014-10-03 (×6): qty 1

## 2014-10-03 MED ORDER — RHO D IMMUNE GLOBULIN 1500 UNIT/2ML IJ SOSY
300.0000 ug | PREFILLED_SYRINGE | Freq: Once | INTRAMUSCULAR | Status: AC
  Administered 2014-10-03: 300 ug via INTRAVENOUS
  Filled 2014-10-03: qty 2

## 2014-10-03 MED ORDER — METHYLERGONOVINE MALEATE 0.2 MG/ML IJ SOLN: 0.2000 mg | INTRAMUSCULAR | Status: DC | PRN

## 2014-10-03 MED ORDER — ACETAMINOPHEN 325 MG PO TABS
650.0000 mg | ORAL_TABLET | ORAL | Status: DC | PRN
  Administered 2014-10-03: 650 mg via ORAL
  Filled 2014-10-03: qty 2

## 2014-10-03 MED ORDER — ONDANSETRON HCL 4 MG PO TABS: 4.0000 mg | ORAL_TABLET | ORAL | Status: DC | PRN

## 2014-10-03 MED ORDER — DIPHENHYDRAMINE HCL 25 MG PO CAPS: 25.0000 mg | ORAL_CAPSULE | Freq: Four times a day (QID) | ORAL | Status: DC | PRN

## 2014-10-03 MED ORDER — IBUPROFEN 600 MG PO TABS
600.0000 mg | ORAL_TABLET | Freq: Four times a day (QID) | ORAL | Status: DC
  Administered 2014-10-03 – 2014-10-04 (×5): 600 mg via ORAL
  Filled 2014-10-03 (×5): qty 1

## 2014-10-03 MED ORDER — ONDANSETRON HCL 4 MG/2ML IJ SOLN: 4.0000 mg | INTRAMUSCULAR | Status: DC | PRN

## 2014-10-03 MED ORDER — BENZOCAINE-MENTHOL 20-0.5 % EX AERO
1.0000 "application " | INHALATION_SPRAY | CUTANEOUS | Status: DC | PRN
  Administered 2014-10-03: 1 via TOPICAL
  Filled 2014-10-03: qty 56

## 2014-10-03 MED ORDER — METHYLERGONOVINE MALEATE 0.2 MG PO TABS: 0.2000 mg | ORAL_TABLET | ORAL | Status: DC | PRN

## 2014-10-03 NOTE — Anesthesia Postprocedure Evaluation (Signed)
Anesthesia Post Note  Patient: Kayla Little  Procedure(s) Performed: * No procedures listed *  Anesthesia type: Epidural  Patient location: Mother/Baby  Post pain: Pain level controlled  Post assessment: Post-op Vital signs reviewed  Last Vitals:  Filed Vitals:   10/03/14 0535  BP: 115/65  Pulse: 99  Temp: 36.8 C  Resp: 18    Post vital signs: Reviewed  Level of consciousness: awake  Complications: No apparent anesthesia complications

## 2014-10-03 NOTE — Lactation Note (Signed)
This note was copied from the chart of [facility]. Lactation Consultation Note: Initial visit with mom. She reports baby latched well about 5 am and has been sleepy since. Had bath about noon and has been sleepy since. Baby asleep in bassinet, mom reports she tried about 1/2 hour ago. Reviewed normal newborn behavior the first 24 hours. Baby for circ this afternoon Reviewed normal behavior after circ and cluster feeding the 2nd night. Mom states she did no reading, no class in prep for breast feeding. Reviewed basic teaching with mom. Encouraged to page for assist when baby showing feeding cues. Mom reports she has lump which developed during pregnancy in right breast. Lump felt at 6:00 on the breast about the size of a lima bean.She reports she had an [place] on it. BF brochure given with resources for support after DC. No questions at present.   Patient Name: Kayla Little Today's Date: 10/03/2014 Reason for consult: Initial assessment   Maternal Data Formula Feeding for Exclusion: No Has patient been taught Hand Expression?: Yes Does the patient have breastfeeding experience prior to this delivery?: No  Feeding    LATCH Score/Interventions                      Lactation Tools Discussed/Used     Consult Status Consult Status: Follow-up Date: 10/03/14 Follow-up type: In-patient    Truddie Crumble 10/03/2014, 1:40 PM

## 2014-10-03 NOTE — Progress Notes (Signed)
PPD 1 VAVD with 2nd degree repair  S:  Reports feeling ok - just tried             Tolerating po/ No nausea or vomiting             Bleeding is light             Pain controlled with motrin and percocet             Up ad lib / ambulatory / voiding QS  Newborn breast feeding  / Circumcision planned  O:               VS: BP 115/65 mmHg  Pulse 99  Temp(Src) 98.3 F (36.8 C) (Oral)  Resp 18  Ht 5\' 6"  (1.676 m)  Wt 73.029 kg (161 lb)  BMI 26.00 kg/m2  SpO2 98%  LMP 12/31/2013  Breastfeeding? Unknown   LABS:              Recent Labs  10/02/14 0125 10/03/14 0555  WBC 13.4* 31.2*  HGB 12.0 10.0*  PLT 228 207               Blood type: --/--/O NEG (07/28 0555)  Rubella: Immune (12/22 0000)                     I&O: Intake/Output      07/27 0701 - 07/28 0700 07/28 0701 - 07/29 0700   Urine (mL/kg/hr) 1100 (0.6)    Blood 492 (0.3)    Total Output 1592     Net -1592          Urine Occurrence 1 x                  Physical Exam:             Alert and oriented X3  Abdomen: soft, non-tender, non-distended              Fundus: firm, non-tender, U-1  Perineum: moderate edema / ice pack in place  Lochia: light   Extremities: trace edema, no calf pain or tenderness    A: PPD # 1 VAVD with 1st degree repair   Doing well - stable status  P: Routine post partum orders   Artelia Laroche CNM, MSN, [facility] 10/03/2014, 12:21 PM

## 2014-10-04 LAB — RH IG WORKUP (INCLUDES ABO/RH)
ABO/RH(D): O NEG
Fetal Screen: NEGATIVE
Gestational Age(Wks): 39.5
Unit division: 0

## 2014-10-04 MED ORDER — IBUPROFEN 600 MG PO TABS: 600.0000 mg | ORAL_TABLET | Freq: Four times a day (QID) | ORAL | Status: DC

## 2014-10-04 MED ORDER — OXYCODONE-ACETAMINOPHEN 5-325 MG PO TABS: 1.0000 | ORAL_TABLET | ORAL | Status: DC | PRN

## 2014-10-04 NOTE — Lactation Note (Signed)
This note was copied from the chart of [place]. Lactation Consultation Note  Follow up visit made prior to discharge.  Mom states breastfeeding is going much better and baby fed well during the night.  Reviewed basics and discharge teaching including engorgement treatment.  Questions answered.   Lactation outpatient services and support reviewed and encouraged.  Patient Name: Kayla Little THYHO'O Date: 10/04/2014     Maternal Data    Feeding Feeding Type: Breast Fed Length of feed: 10 min  LATCH Score/Interventions                      Lactation Tools Discussed/Used     Consult Status      [name] 10/04/2014, 9:26 AM

## 2014-10-04 NOTE — Discharge Instructions (Signed)
Breast Pumping Tips °If you are breastfeeding, there may be times when you cannot feed your baby directly. Returning to work or going on a trip are common examples. Pumping allows you to store breast milk and feed it to your baby later.  °You may not get much milk when you first start to pump. Your breasts should start to make more after a few days. If you pump at the times you usually feed your baby, you may be able to keep making enough milk to feed your baby without also using formula. The more often you pump, the more milk you will produce.  °WHEN SHOULD I PUMP?  °· You can begin to pump soon after delivery. However, some experts recommend waiting about 4 weeks before giving your infant a bottle to make sure breastfeeding is going well.  °· If you plan to return to work, begin pumping a few weeks before. This will help you develop techniques that work best for you. It also lets you build up a supply of breast milk.   °· When you are with your infant, feed on demand and pump after each feeding.   °· When you are away from your infant for several hours, pump for about 15 minutes every 2-3 hours. Pump both breasts at the same time if you can.   °· If your infant has a formula feeding, make sure to pump around the same time.     °· If you drink any alcohol, wait 2 hours before pumping.   °HOW DO I PREPARE TO PUMP? °Your let-down reflex is the natural reaction to stimulation that makes your breast milk flow. It is easier to stimulate this reflex when you are relaxed. Find relaxation techniques that work for you. If you have difficulty with your let-down reflex, try these methods:  °· Smell one of your infant's blankets or an item of clothing.   °· Look at a picture or video of your infant.   °· Sit in a quiet, private space.   °· Massage the breast you plan to pump.   °· Place soothing warmth on the breast.   °· Play relaxing music.   °WHAT ARE SOME GENERAL BREAST PUMPING TIPS? °· Wash your hands before you pump. You  do not need to wash your nipples or breasts. °· There are three ways to pump. °¨ You can use your hand to massage and compress your breast. °¨ You can use a handheld manual pump. °¨ You can use an electric pump.   °· Make sure the suction cup (flange) on the breast pump is the right size. Place the flange directly over the nipple. If it is the wrong size or placed the wrong way, it may be painful and cause nipple damage.   °· If pumping is uncomfortable, apply a small amount of purified or modified lanolin to your nipple and areola. °· If you are using an electric pump, adjust the speed and suction power to be more comfortable. °· If pumping is painful or if you are not getting very much milk, you may need a different type of pump. A lactation consultant can help you determine what type of pump to use.   °· Keep a full water bottle near you at all times. Drinking lots of fluid helps you make more milk.  °· You can store your milk to use later. Pumped breast milk can be stored in a sealable, sterile container or plastic bag. Label all stored breast milk with the date you pumped it. °¨ Milk can stay out at room temperature for up to 8 hours. °¨   You can store your milk in the refrigerator for up to 8 days.  You can store your milk in the freezer for 3 months. Thaw frozen milk using warm water. Do not put it in the microwave.  Do not smoke. Smoking can lower your milk supply and harm your infant. If you need help quitting, ask your health care provider to recommend a program.  [place] A LACTATION CONSULTANT?  You are having trouble pumping.  You are concerned that you are not making enough milk.  You have nipple pain, soreness, or redness.  You want to use birth control. Birth control pills may lower your milk supply. Talk to your health care provider about your options. Document Released: 08/12/2009 Document Revised: 02/27/2013 Document Reviewed:  12/15/2012 [facility] Patient Information 2015 [place], [place]. This information is not intended to replace advice given to you by your health care provider. Make sure you discuss any questions you have with your health care provider.   Nutrition for the New Mother  A new mother needs good health and nutrition so she can have energy to take care of a new baby. Whether a mother breastfeeds or formula feeds the baby, it is important to have a well-balanced diet. Foods from all the food groups should be chosen to meet the new mother's energy needs and to give her the nutrients needed for repair and healing.  A HEALTHY EATING PLAN The My Pyramid plan for Moms outlines what you should eat to help you and your baby stay healthy. The energy and amount of food you need depends on whether or not you are breastfeeding. If you are breastfeeding you will need more nutrients. If you choose not to breastfeed, your nutrition goal should be to return to a healthy weight. Limiting calories may be needed if you are not breastfeeding.  HOME CARE INSTRUCTIONS   For a personal plan based on your unique needs, see your Registered Dietitian or visit [email or web address]  Eat a variety of foods. The plan below will help guide you. The following chart has a suggested daily meal plan from the My Pyramid for Moms.  Eat a variety of fruits and vegetables.  Eat more dark green and orange vegetables and cooked dried beans.  Make half your grains whole grains. Choose whole instead of refined grains.  Choose low-fat or lean meats and poultry.  Choose low-fat or fat-free dairy products like milk, cheese, or yogurt. Fruits  Breastfeeding: 2 cups  Non-Breastfeeding: 2 cups  What Counts as a serving?  1 cup of fruit or juice.   cup dried fruit. Vegetables  Breastfeeding: 3 cups  Non-Breastfeeding: 2  cups  What Counts as a serving?  1 cup raw or cooked vegetables.  Juice or 2 cups raw leafy  vegetables. Grains  Breastfeeding: 8 oz  Non-Breastfeeding: 6 oz  What Counts as a serving?  1 slice bread.  1 oz ready-to-eat cereal.   cup cooked pasta, rice, or cereal. Meat and Beans  Breastfeeding: 6  oz  Non-Breastfeeding: 5  oz  What Counts as a serving?  1 oz lean meat, poultry, or fish   cup cooked dry beans   oz nuts or 1 egg  1 tbs peanut butter Milk  Breastfeeding: 3 cups  Non-Breastfeeding: 3 cups  What Counts as a serving?  1 cup milk.  8 oz yogurt.  1  oz cheese.  2 oz processed cheese. TIPS FOR THE BREASTFEEDING MOM  Rapid  weight loss is not suggested when you are breastfeeding. By simply breastfeeding, you will be able to lose the weight gained during your pregnancy. Your caregiver can keep track of your weight and tell you if your weight loss is appropriate.  Be sure to drink fluids. You may notice that you are thirstier than usual. A suggestion is to drink a glass of water or other beverage whenever you breastfeed.  Avoid alcohol as it can be passed into your breast milk.  Limit caffeine drinks to no more than 2 to 3 cups per day.  You may need to keep taking your prenatal vitamin while you are breastfeeding. Talk with your caregiver about taking a vitamin or supplement. RETURING TO A HEALTHY WEIGHT  The My Pyramid Plan for Moms will help you return to a healthy weight. It will also provide the nutrients you need.  You may need to limit "empty" calories. These include:  High fat foods like fried foods, fatty meats, fast food, butter, and mayonnaise.  High sugar foods like sodas, jelly, candy, and sweets.  Be physically active. Include 30 minutes of exercise or more each day. Choose an activity you like such as walking, swimming, biking, or aerobics. Check with your caregiver before you start to exercise. Document Released: 06/01/2007 Document Revised: 05/17/2011 Document Reviewed: 06/01/2007 [facility] Patient Information  2015 [place], [place]. This information is not intended to replace advice given to you by your health care provider. Make sure you discuss any questions you have with your health care provider. Postpartum Depression and Baby Blues The postpartum period begins right after the birth of a baby. During this time, there is often a great amount of joy and excitement. It is also a time of many changes in the life of the parents. Regardless of how many times a mother gives birth, each child brings new challenges and dynamics to the family. It is not unusual to have feelings of excitement along with confusing shifts in moods, emotions, and thoughts. All mothers are at risk of developing postpartum depression or the "baby blues." These mood changes can occur right after giving birth, or they may occur many months after giving birth. The baby blues or postpartum depression can be mild or severe. Additionally, postpartum depression can go away rather quickly, or it can be a long-term condition.  CAUSES Raised hormone levels and the rapid drop in those levels are thought to be a main cause of postpartum depression and the baby blues. A number of hormones change during and after pregnancy. Estrogen and progesterone usually decrease right after the delivery of your baby. The levels of thyroid hormone and various cortisol steroids also rapidly drop. Other factors that play a role in these mood changes include major life events and genetics.  RISK FACTORS If you have any of the following risks for the baby blues or postpartum depression, know what symptoms to watch out for during the postpartum period. Risk factors that may increase the likelihood of getting the baby blues or postpartum depression include:  Having a personal or family history of depression.   Having depression while being pregnant.   Having premenstrual mood issues or mood issues related to oral contraceptives.  Having a lot of life stress.   Having  marital conflict.   Lacking a social support network.   Having a baby with special needs.   Having health problems, such as diabetes.  SIGNS AND SYMPTOMS Symptoms of baby blues include:  Brief changes in mood, such as  going from extreme happiness to sadness.  Decreased concentration.   Difficulty sleeping.   Crying spells, tearfulness.   Irritability.   Anxiety.  Symptoms of postpartum depression typically begin within the first month after giving birth. These symptoms include:  Difficulty sleeping or excessive sleepiness.   Marked weight loss.   Agitation.   Feelings of worthlessness.   Lack of interest in activity or food.  Postpartum psychosis is a very serious condition and can be dangerous. Fortunately, it is rare. Displaying any of the following symptoms is cause for immediate medical attention. Symptoms of postpartum psychosis include:   Hallucinations and delusions.   Bizarre or disorganized behavior.   Confusion or disorientation.  DIAGNOSIS  A diagnosis is made by an evaluation of your symptoms. There are no medical or lab tests that lead to a diagnosis, but there are various questionnaires that a health care provider may use to identify those with the baby blues, postpartum depression, or psychosis. Often, a screening tool called the [place] Postnatal Depression Scale is used to diagnose depression in the postpartum period.  TREATMENT The baby blues usually goes away on its own in 1-2 weeks. Social support is often all that is needed. You will be encouraged to get adequate sleep and rest. Occasionally, you may be given medicines to help you sleep.  Postpartum depression requires treatment because it can last several months or longer if it is not treated. Treatment may include individual or group therapy, medicine, or both to address any social, physiological, and psychological factors that may play a role in the depression. Regular exercise, a  healthy diet, rest, and social support may also be strongly recommended.  Postpartum psychosis is more serious and needs treatment right away. Hospitalization is often needed. HOME CARE INSTRUCTIONS  Get as much rest as you can. Nap when the baby sleeps.   Exercise regularly. Some women find yoga and walking to be beneficial.   Eat a balanced and nourishing diet.   Do little things that you enjoy. Have a cup of tea, take a bubble bath, read your favorite magazine, or listen to your favorite music.  Avoid alcohol.   Ask for help with household chores, cooking, grocery shopping, or running errands as needed. Do not try to do everything.   Talk to people close to you about how you are feeling. Get support from your partner, family members, friends, or other new moms.  Try to stay positive in how you think. Think about the things you are grateful for.   Do not spend a lot of time alone.   Only take over-the-counter or prescription medicine as directed by your health care provider.  Keep all your postpartum appointments.   Let your health care provider know if you have any concerns.  SEEK MEDICAL CARE IF: You are having a reaction to or problems with your medicine. SEEK IMMEDIATE MEDICAL CARE IF:  You have suicidal feelings.   You think you may harm the baby or someone else. MAKE SURE YOU:  Understand these instructions.  Will watch your condition.  Will get help right away if you are not doing well or get worse. Document Released: 11/27/2003 Document Revised: 02/27/2013 Document Reviewed: 12/04/2012 [facility] Patient Information 2015 [place], [place]. This information is not intended to replace advice given to you by your health care provider. Make sure you discuss any questions you have with your health care provider. Postpartum Care After Vaginal Delivery After you deliver your newborn (postpartum period), the usual stay  in the hospital is 24-72 hours. If there were  problems with your labor or delivery, or if you have other medical problems, you might be in the hospital longer.  While you are in the hospital, you will receive help and instructions on how to care for yourself and your newborn during the postpartum period.  While you are in the hospital:  Be sure to tell your nurses if you have pain or discomfort, as well as where you feel the pain and what makes the pain worse.  If you had an incision made near your vagina (episiotomy) or if you had some tearing during delivery, the nurses may put ice packs on your episiotomy or tear. The ice packs may help to reduce the pain and swelling.  If you are breastfeeding, you may feel uncomfortable contractions of your uterus for a couple of weeks. This is normal. The contractions help your uterus get back to normal size.  It is normal to have some bleeding after delivery.  For the first 1-3 days after delivery, the flow is red and the amount may be similar to a period.  It is common for the flow to start and stop.  In the first few days, you may pass some small clots. Let your nurses know if you begin to pass large clots or your flow increases.  Do not  flush blood clots down the toilet before having the nurse look at them.  During the next 3-10 days after delivery, your flow should become more watery and pink or brown-tinged in color.  Ten to fourteen days after delivery, your flow should be a small amount of yellowish-white discharge.  The amount of your flow will decrease over the first few weeks after delivery. Your flow may stop in 6-8 weeks. Most women have had their flow stop by 12 weeks after delivery.  You should change your sanitary pads frequently.  Wash your hands thoroughly with soap and water for at least 20 seconds after changing pads, using the toilet, or before holding or feeding your newborn.  You should feel like you need to empty your bladder within the first 6-8 hours after  delivery.  In case you become weak, lightheaded, or faint, call your nurse before you get out of bed for the first time and before you take a shower for the first time.  Within the first few days after delivery, your breasts may begin to feel tender and full. This is called engorgement. Breast tenderness usually goes away within 48-72 hours after engorgement occurs. You may also notice milk leaking from your breasts. If you are not breastfeeding, do not stimulate your breasts. Breast stimulation can make your breasts produce more milk.  Spending as much time as possible with your newborn is very important. During this time, you and your newborn can feel close and get to know each other. Having your newborn stay in your room (rooming in) will help to strengthen the bond with your newborn. It will give you time to get to know your newborn and become comfortable caring for your newborn.  Your hormones change after delivery. Sometimes the hormone changes can temporarily cause you to feel sad or tearful. These feelings should not last more than a few days. If these feelings last longer than that, you should talk to your caregiver.  If desired, talk to your caregiver about methods of family planning or contraception.  Talk to your caregiver about immunizations. Your caregiver may want you to have  the following immunizations before leaving the hospital:  Tetanus, diphtheria, and pertussis (Tdap) or tetanus and diphtheria (Td) immunization. It is very important that you and your family (including grandparents) or others caring for your newborn are up-to-date with the Tdap or Td immunizations. The Tdap or Td immunization can help protect your newborn from getting ill.  Rubella immunization.  Varicella (chickenpox) immunization.  Influenza immunization. You should receive this annual immunization if you did not receive the immunization during your pregnancy. Document Released: 12/20/2006 Document  Revised: 11/17/2011 Document Reviewed: 10/20/2011 [facility] Patient Information 2015 [place], [place]. This information is not intended to replace advice given to you by your health care provider. Make sure you discuss any questions you have with your health care provider. Breastfeeding and Mastitis Mastitis is inflammation of the breast tissue. It can occur in women who are breastfeeding. This can make breastfeeding painful. Mastitis will sometimes go away on its own. Your health care provider will help determine if treatment is needed. CAUSES Mastitis is often associated with a blocked milk (lactiferous) duct. This can happen when too much milk builds up in the breast. Causes of excess milk in the breast can include:  Poor latch-on. If your baby is not latched onto the breast properly, she or he may not empty your breast completely while breastfeeding.  Allowing too much time to pass between feedings.  Wearing a bra or other clothing that is too tight. This puts extra pressure on the lactiferous ducts so milk does not flow through them as it should. Mastitis can also be caused by a bacterial infection. Bacteria may enter the breast tissue through cuts or openings in the skin. In women who are breastfeeding, this may occur because of cracked or irritated skin. Cracks in the skin are often caused when your baby does not latch on properly to the breast. SIGNS AND SYMPTOMS  Swelling, redness, tenderness, and pain in an area of the breast.  Swelling of the glands under the arm on the same side.  Fever may or may not accompany mastitis. If an infection is allowed to progress, a collection of pus (abscess) may develop. DIAGNOSIS  Your health care provider can usually diagnose mastitis based on your symptoms and a physical exam. Tests may be done to help confirm the diagnosis. These may include:  Removal of pus from the breast by applying pressure to the area. This pus can be examined in the lab to  determine which bacteria are present. If an abscess has developed, the fluid in the abscess can be removed with a needle. This can also be used to confirm the diagnosis and determine the bacteria present. In most cases, pus will not be present.  Blood tests to determine if your body is fighting a bacterial infection.  Mammogram or ultrasound tests to rule out other problems or diseases. TREATMENT  Mastitis that occurs with breastfeeding will sometimes go away on its own. Your health care provider may choose to wait 24 hours after first seeing you to decide whether a prescription medicine is needed. If your symptoms are worse after 24 hours, your health care provider will likely prescribe an antibiotic medicine to treat the mastitis. He or she will determine which bacteria are most likely causing the infection and will then select an appropriate antibiotic medicine. This is sometimes changed based on the results of tests performed to identify the bacteria, or if there is no response to the antibiotic medicine selected. Antibiotic medicines are usually given by mouth.  You may also be given medicine for pain. HOME CARE INSTRUCTIONS  Only take over-the-counter or prescription medicines for pain, fever, or discomfort as directed by your health care provider.  If your health care provider prescribed an antibiotic medicine, take the medicine as directed. Make sure you finish it even if you start to feel better.  Do not wear a tight or underwire bra. Wear a soft, supportive bra.  Increase your fluid intake, especially if you have a fever.  Continue to empty the breast. Your health care provider can tell you whether this milk is safe for your infant or needs to be thrown out. You may be told to stop nursing until your health care provider thinks it is safe for your baby. Use a breast pump if you are advised to stop nursing.  Keep your nipples clean and dry.  Empty the first breast completely before going  to the other breast. If your baby is not emptying your breasts completely for some reason, use a breast pump to empty your breasts.  If you go back to work, pump your breasts while at work to stay in time with your nursing schedule.  Avoid allowing your breasts to become overly filled with milk (engorged). SEEK MEDICAL CARE IF:  You have pus-like discharge from the breast.  Your symptoms do not improve with the treatment prescribed by your health care provider within 2 days. SEEK IMMEDIATE MEDICAL CARE IF:  Your pain and swelling are getting worse.  You have pain that is not controlled with medicine.  You have a red line extending from the breast toward your armpit.  You have a fever or persistent symptoms for more than 2-3 days.  You have a fever and your symptoms suddenly get worse. MAKE SURE YOU:   Understand these instructions.  Will watch your condition.  Will get help right away if you are not doing well or get worse. Document Released: 06/19/2004 Document Revised: 02/27/2013 Document Reviewed: 09/28/2012 [facility] Patient Information 2015 [place], [place]. This information is not intended to replace advice given to you by your health care provider. Make sure you discuss any questions you have with your health care provider.  Breastfeeding Deciding to breastfeed is one of the best choices you can make for you and your baby. A change in hormones during pregnancy causes your breast tissue to grow and increases the number and size of your milk ducts. These hormones also allow proteins, sugars, and fats from your blood supply to make breast milk in your milk-producing glands. Hormones prevent breast milk from being released before your baby is born as well as prompt milk flow after birth. Once breastfeeding has begun, thoughts of your baby, as well as his or her sucking or crying, can stimulate the release of milk from your milk-producing glands.  BENEFITS OF BREASTFEEDING For Your  Baby  Your first milk (colostrum) helps your baby's digestive system function better.   There are antibodies in your milk that help your baby fight off infections.   Your baby has a lower incidence of asthma, allergies, and sudden infant death syndrome.   The nutrients in breast milk are better for your baby than infant formulas and are designed uniquely for your baby's needs.   Breast milk improves your baby's brain development.   Your baby is less likely to develop other conditions, such as childhood obesity, asthma, or type 2 diabetes mellitus.  For You   Breastfeeding helps to create a very special bond between  you and your baby.   Breastfeeding is convenient. Breast milk is always available at the correct temperature and costs nothing.   Breastfeeding helps to burn calories and helps you lose the weight gained during pregnancy.   Breastfeeding makes your uterus contract to its prepregnancy size faster and slows bleeding (lochia) after you give birth.   Breastfeeding helps to lower your risk of developing type 2 diabetes mellitus, osteoporosis, and breast or ovarian cancer later in life. SIGNS THAT YOUR BABY IS HUNGRY Early Signs of Hunger  Increased alertness or activity.  Stretching.  Movement of the head from side to side.  Movement of the head and opening of the mouth when the corner of the mouth or cheek is stroked (rooting).  Increased sucking sounds, smacking lips, cooing, sighing, or squeaking.  Hand-to-mouth movements.  Increased sucking of fingers or hands. Late Signs of Hunger  Fussing.  Intermittent crying. Extreme Signs of Hunger Signs of extreme hunger will require calming and consoling before your baby will be able to breastfeed successfully. Do not wait for the following signs of extreme hunger to occur before you initiate breastfeeding:   Restlessness.  A loud, strong cry.   Screaming. BREASTFEEDING BASICS Breastfeeding  Initiation  Find a comfortable place to sit or lie down, with your neck and back well supported.  Place a pillow or rolled up blanket under your baby to bring him or her to the level of your breast (if you are seated). Nursing pillows are specially designed to help support your arms and your baby while you breastfeed.  Make sure that your baby's abdomen is facing your abdomen.   Gently massage your breast. With your fingertips, massage from your chest wall toward your nipple in a circular motion. This encourages milk flow. You may need to continue this action during the feeding if your milk flows slowly.  Support your breast with 4 fingers underneath and your thumb above your nipple. Make sure your fingers are well away from your nipple and your baby's mouth.   Stroke your baby's lips gently with your finger or nipple.   When your baby's mouth is open wide enough, quickly bring your baby to your breast, placing your entire nipple and as much of the colored area around your nipple (areola) as possible into your baby's mouth.   More areola should be visible above your baby's upper lip than below the lower lip.   Your baby's tongue should be between his or her lower gum and your breast.   Ensure that your baby's mouth is correctly positioned around your nipple (latched). Your baby's lips should create a seal on your breast and be turned out (everted).  It is common for your baby to suck about 2-3 minutes in order to start the flow of breast milk. Latching Teaching your baby how to latch on to your breast properly is very important. An improper latch can cause nipple pain and decreased milk supply for you and poor weight gain in your baby. Also, if your baby is not latched onto your nipple properly, he or she may swallow some air during feeding. This can make your baby fussy. Burping your baby when you switch breasts during the feeding can help to get rid of the air. However, teaching your  baby to latch on properly is still the best way to prevent fussiness from swallowing air while breastfeeding. Signs that your baby has successfully latched on to your nipple:    Silent tugging or silent  sucking, without causing you pain.   Swallowing heard between every 3-4 sucks.    Muscle movement above and in front of his or her ears while sucking.  Signs that your baby has not successfully latched on to nipple:   Sucking sounds or smacking sounds from your baby while breastfeeding.  Nipple pain. If you think your baby has not latched on correctly, slip your finger into the corner of your baby's mouth to break the suction and place it between your baby's gums. Attempt breastfeeding initiation again. Signs of Successful Breastfeeding Signs from your baby:   A gradual decrease in the number of sucks or complete cessation of sucking.   Falling asleep.   Relaxation of his or her body.   Retention of a small amount of milk in his or her mouth.   Letting go of your breast by himself or herself. Signs from you:  Breasts that have increased in firmness, weight, and size 1-3 hours after feeding.   Breasts that are softer immediately after breastfeeding.  Increased milk volume, as well as a change in milk consistency and color by the fifth day of breastfeeding.   Nipples that are not sore, cracked, or bleeding. Signs That Your [employer] is Getting Enough Milk  Wetting at least 3 diapers in a 24-hour period. The urine should be clear and pale yellow by age 582 days.  At least 3 stools in a 24-hour period by age 582 days. The stool should be soft and yellow.  At least 3 stools in a 24-hour period by age 58 days. The stool should be seedy and yellow.  No loss of weight greater than 10% of birth weight during the first 26 days of age.  Average weight gain of 4-7 ounces (113-198 g) per week after age 37 days.  Consistent daily weight gain by age 581 days, without weight loss after the  age of 2 weeks. After a feeding, your baby may spit up a small amount. This is common. BREASTFEEDING FREQUENCY AND DURATION Frequent feeding will help you make more milk and can prevent sore nipples and breast engorgement. Breastfeed when you feel the need to reduce the fullness of your breasts or when your baby shows signs of hunger. This is called "breastfeeding on demand." Avoid introducing a pacifier to your baby while you are working to establish breastfeeding (the first 4-6 weeks after your baby is born). After this time you may choose to use a pacifier. Research has shown that pacifier use during the first year of a baby's life decreases the risk of sudden infant death syndrome (SIDS). Allow your baby to feed on each breast as long as he or she wants. Breastfeed until your baby is finished feeding. When your baby unlatches or falls asleep while feeding from the first breast, offer the second breast. Because newborns are often sleepy in the first few weeks of life, you may need to awaken your baby to get him or her to feed. Breastfeeding times will vary from baby to baby. However, the following rules can serve as a guide to help you ensure that your baby is properly fed:  Newborns (babies 34 weeks of age or younger) may breastfeed every 1-3 hours.  Newborns should not go longer than 3 hours during the day or 5 hours during the night without breastfeeding.  You should breastfeed your baby a minimum of 8 times in a 24-hour period until you begin to introduce solid foods to your baby at around 6  months of age. BREAST MILK PUMPING Pumping and storing breast milk allows you to ensure that your baby is exclusively fed your breast milk, even at times when you are unable to breastfeed. This is especially important if you are going back to work while you are still breastfeeding or when you are not able to be present during feedings. Your lactation consultant can give you guidelines on how long it is safe to  store breast milk.  A breast pump is a machine that allows you to pump milk from your breast into a sterile bottle. The pumped breast milk can then be stored in a refrigerator or freezer. Some breast pumps are operated by hand, while others use electricity. Ask your lactation consultant which type will work best for you. Breast pumps can be purchased, but some hospitals and breastfeeding support groups lease breast pumps on a monthly basis. A lactation consultant can teach you how to hand express breast milk, if you prefer not to use a pump.  CARING FOR YOUR BREASTS WHILE YOU BREASTFEED Nipples can become dry, cracked, and sore while breastfeeding. The following recommendations can help keep your breasts moisturized and healthy:  Avoid using soap on your nipples.   Wear a supportive bra. Although not required, special nursing bras and tank tops are designed to allow access to your breasts for breastfeeding without taking off your entire bra or top. Avoid wearing underwire-style bras or extremely tight bras.  Air dry your nipples for 3-29minutes after each feeding.   Use only cotton bra pads to absorb leaked breast milk. Leaking of breast milk between feedings is normal.   Use lanolin on your nipples after breastfeeding. Lanolin helps to maintain your skin's normal moisture barrier. If you use pure lanolin, you do not need to wash it off before feeding your baby again. Pure lanolin is not toxic to your baby. You may also hand express a few drops of breast milk and gently massage that milk into your nipples and allow the milk to air dry. In the first few weeks after giving birth, some women experience extremely full breasts (engorgement). Engorgement can make your breasts feel heavy, warm, and tender to the touch. Engorgement peaks within 3-5 days after you give birth. The following recommendations can help ease engorgement:  Completely empty your breasts while breastfeeding or pumping. You may want  to start by applying warm, moist heat (in the shower or with warm water-soaked hand towels) just before feeding or pumping. This increases circulation and helps the milk flow. If your baby does not completely empty your breasts while breastfeeding, pump any extra milk after he or she is finished.  Wear a snug bra (nursing or regular) or tank top for 1-2 days to signal your body to slightly decrease milk production.  Apply ice packs to your breasts, unless this is too uncomfortable for you.  Make sure that your baby is latched on and positioned properly while breastfeeding. If engorgement persists after 48 hours of following these recommendations, contact your health care provider or a Science writer. OVERALL HEALTH CARE RECOMMENDATIONS WHILE BREASTFEEDING  Eat healthy foods. Alternate between meals and snacks, eating 3 of each per day. Because what you eat affects your breast milk, some of the foods may make your baby more irritable than usual. Avoid eating these foods if you are sure that they are negatively affecting your baby.  Drink milk, fruit juice, and water to satisfy your thirst (about 10 glasses a day).   Rest  often, relax, and continue to take your prenatal vitamins to prevent fatigue, stress, and anemia.  Continue breast self-awareness checks.  Avoid chewing and smoking tobacco.  Avoid alcohol and drug use. Some medicines that may be harmful to your baby can pass through breast milk. It is important to ask your health care provider before taking any medicine, including all over-the-counter and prescription medicine as well as vitamin and herbal supplements. It is possible to become pregnant while breastfeeding. If birth control is desired, ask your health care provider about options that will be safe for your baby. SEEK MEDICAL CARE IF:   You feel like you want to stop breastfeeding or have become frustrated with breastfeeding.  You have painful breasts or  nipples.  Your nipples are cracked or bleeding.  Your breasts are red, tender, or warm.  You have a swollen area on either breast.  You have a fever or chills.  You have nausea or vomiting.  You have drainage other than breast milk from your nipples.  Your breasts do not become full before feedings by the fifth day after you give birth.  You feel sad and depressed.  Your baby is too sleepy to eat well.  Your baby is having trouble sleeping.   Your baby is wetting less than 3 diapers in a 24-hour period.  Your baby has less than 3 stools in a 24-hour period.  Your baby's skin or the white part of his or her eyes becomes yellow.   Your baby is not gaining weight by 34 days of age. SEEK IMMEDIATE MEDICAL CARE IF:   Your baby is overly tired (lethargic) and does not want to wake up and feed.  Your baby develops an unexplained fever. Document Released: 02/22/2005 Document Revised: 02/27/2013 Document Reviewed: 08/16/2012 [facility] Patient Information 2015 [place], [place]. This information is not intended to replace advice given to you by your health care provider. Make sure you discuss any questions you have with your health care provider.

## 2014-10-04 NOTE — Discharge Summary (Signed)
Obstetric Discharge Summary Reason for Admission: induction of labor for moderate polyhydramnios Prenatal Procedures: NST and ultrasound Intrapartum Procedures: vacuum Postpartum Procedures: none Complications-Operative and Postpartum: 2nd degree perineal laceration HEMOGLOBIN  Date Value Ref Range Status  10/03/2014 10.0* 12.0 - 15.0 g/dL Final  12/05/2012 13.1 12.2 - 16.2 g/dL Final   HCT  Date Value Ref Range Status  10/03/2014 29.9* 36.0 - 46.0 % Final   HCT, POC  Date Value Ref Range Status  12/05/2012 41.2 37.7 - 47.9 % Final    Physical Exam:  General: alert, cooperative and no distress Lochia: appropriate Uterine Fundus: firm, midline, U-1 Incision: 2nd degree repair healing well, no significant drainage, no dehiscence, no significant erythema DVT Evaluation: No evidence of DVT seen on physical exam. Negative Homan's sign. No cords or calf tenderness. No significant calf/ankle edema.  Discharge Diagnoses: Term Pregnancy-delivered  Discharge Information: Date: 10/04/2014 Activity: pelvic rest Diet: routine Medications: PNV and Ibuprofen Condition: stable Instructions: refer to practice specific booklet Discharge to: home Follow-up Information    Follow up with Lovenia Kim, MD. Schedule an appointment as soon as possible for a visit in 6 weeks.   Specialty:  Obstetrics and Gynecology   Why:  postpartum visit   Contact information:   [place] [address] [phone]       Newborn Data: Live born female on 10/02/2014 Birth Weight: 8 lb 14 oz (4026 g) APGAR: 6, 9  Home with mother.  Laury Deep, M MSN, CNM 10/04/2014, 9:25 AM

## 2014-10-04 NOTE — Progress Notes (Signed)
PPD #2 VAVD with 2nd degree repair  S:  Reports feeling well             Tolerating po/ No nausea or vomiting             Bleeding is light             Pain controlled with motrin and percocet             Up ad lib / ambulatory / voiding QS  Newborn breast feeding  / Circumcision planned  O:               VS: BP 104/55 mmHg  Pulse 86  Temp(Src) 98 F (36.7 C) (Oral)  Resp 16  Ht 5\' 6"  (1.676 m)  Wt 73.029 kg (161 lb)  BMI 26.00 kg/m2  SpO2 98%  LMP 12/31/2013  Breastfeeding   LABS:               Recent Labs  10/02/14 0125 10/03/14 0555  WBC 13.4* 31.2*  HGB 12.0 10.0*  PLT 228 207               Blood type: O NEG (07/28 0555) / Rhophylac received 7/28  Rubella: Immune (12/22 0000)                     I&O: Intake/Output      07/28 0701 - 07/29 0700 07/29 0701 - 07/30 0700   Urine (mL/kg/hr)     Blood     Total Output       Net                          Physical Exam:             Alert and oriented x 3  Abdomen: soft, non-tender, non-distended              Fundus: firm, non-tender, U-1  Perineum: moderate edema / ice pack in place  Lochia: light   Extremities: trace edema, no calf pain or tenderness    A: PPD # 2 / G1P1001 / VAVD with 1st degree repair   Rh Negative  Doing well - stable status  P: Routine post partum orders   Laury Deep, Jerilynn Mages MSN, CNM  10/04/2014, 9:16 AM

## 2014-10-06 LAB — TYPE AND SCREEN
ABO/RH(D): O NEG
Antibody Screen: POSITIVE
DAT, IgG: NEGATIVE
Unit division: 0
Unit division: 0

## 2014-10-08 DIAGNOSIS — Z6741 Type O blood, Rh negative: Secondary | ICD-10-CM

## 2014-11-26 ENCOUNTER — Telehealth: Payer: Self-pay | Admitting: *Deleted

## 2014-11-26 NOTE — Telephone Encounter (Signed)
Pt is 8 weeks post partum had her final visit with OBGYN, pt said in last 24 hours she has had a heavy period. Changing maxi pad about every 2 hours, passed a large clot this am as well, cramping. Would like patient to schedule OV? Please advise

## 2014-11-26 NOTE — Telephone Encounter (Signed)
Message left

## 2014-11-27 NOTE — Telephone Encounter (Signed)
Message left

## 2014-11-28 NOTE — Telephone Encounter (Signed)
Message left

## 2015-01-28 ENCOUNTER — Ambulatory Visit: Payer: Self-pay | Admitting: Women's Health

## 2016-07-23 ENCOUNTER — Ambulatory Visit: Payer: Self-pay | Admitting: Family Medicine

## 2016-09-16 IMAGING — US US ABDOMEN COMPLETE
1 series · 13 of 25 positions shown · non-contrast
Comparison: CT abdomen pelvis [DATE]

CLINICAL DATA: Epigastric and right upper quadrant pain

EXAM:
ABDOMEN ULTRASOUND COMPLETE

[Series 1: us abdomen complete · 0.22mm/px · 13 of 120 slices shown]
[im 1/120]
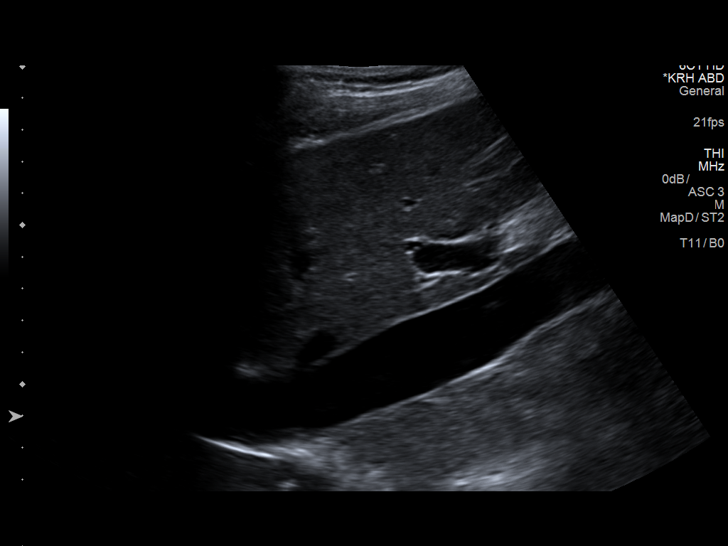
[im 10/120]
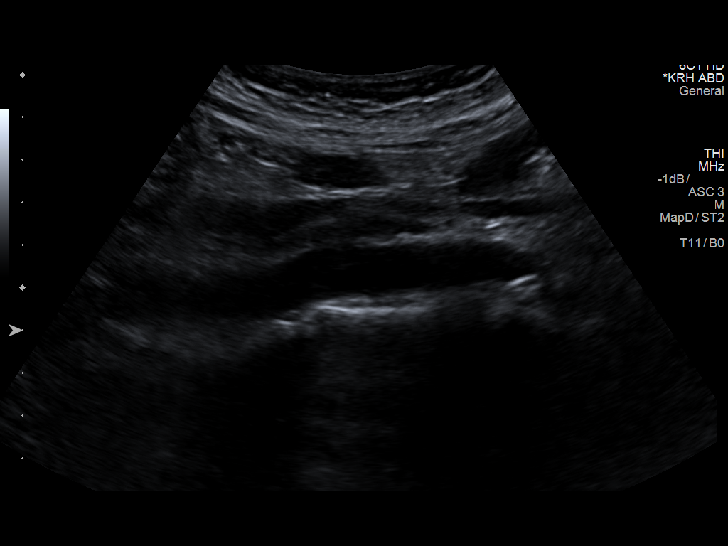
[im 20/120]
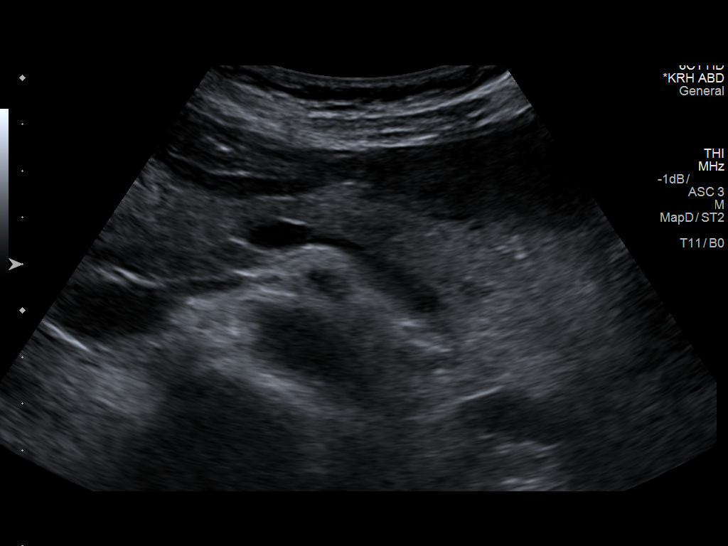
[im 30/120]
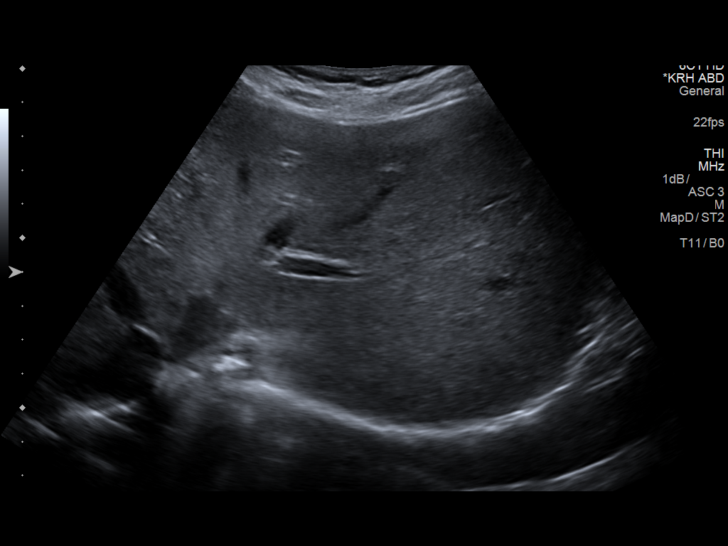
[im 40/120]
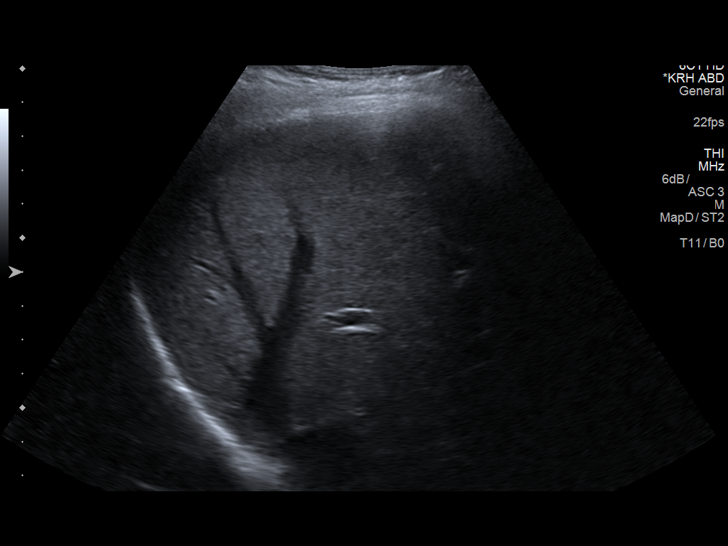
[im 50/120]
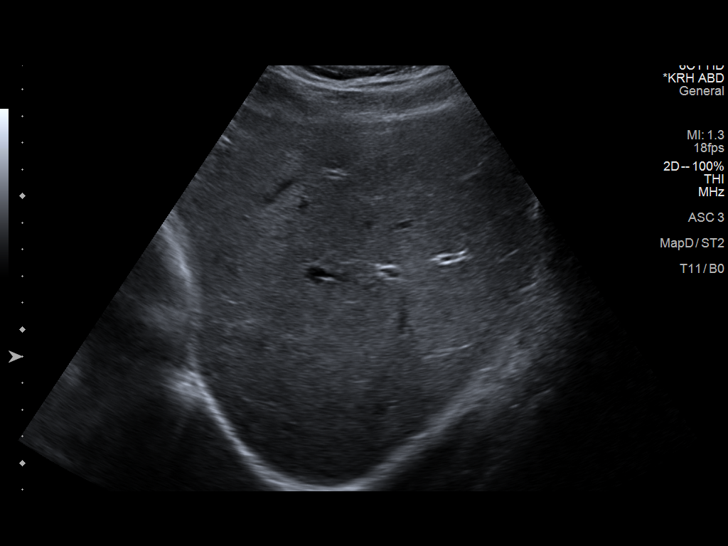
[im 60/120]
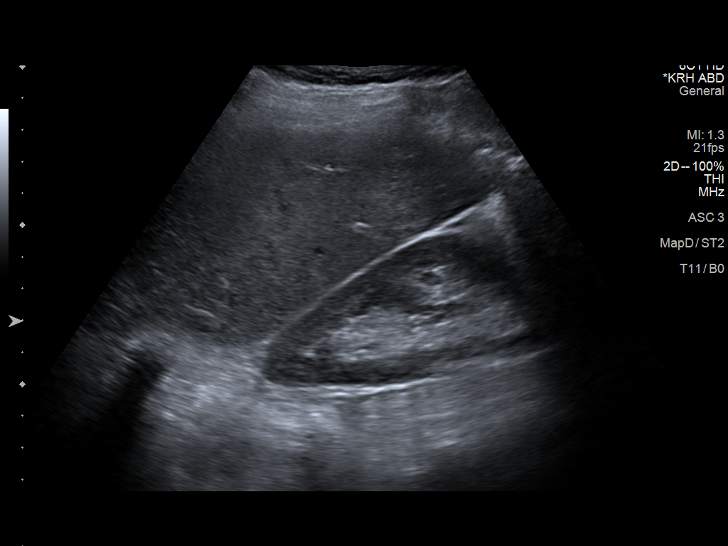
[im 70/120]
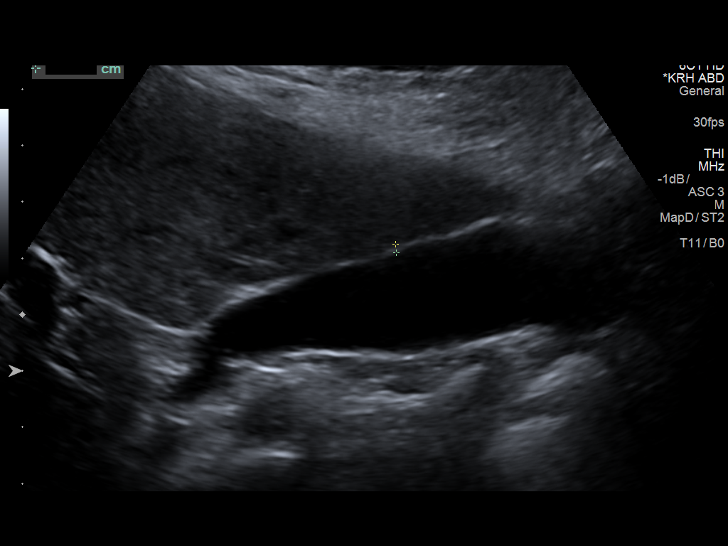
[im 80/120]
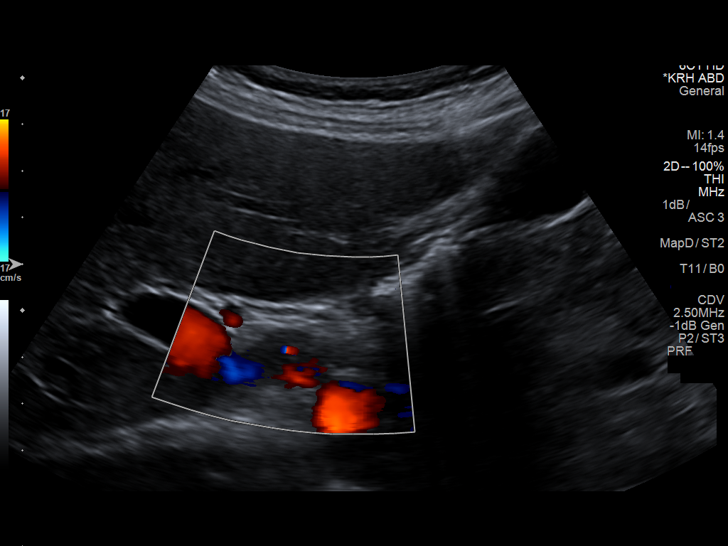
[im 90/120]
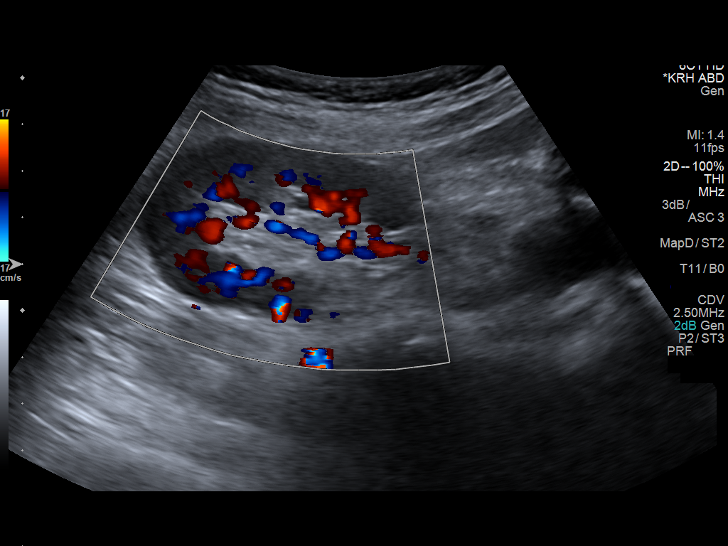
[im 100/120]
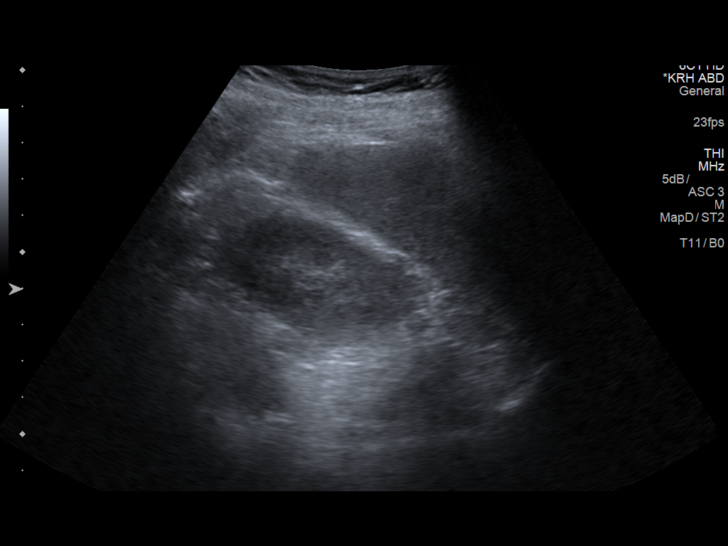
[im 110/120]
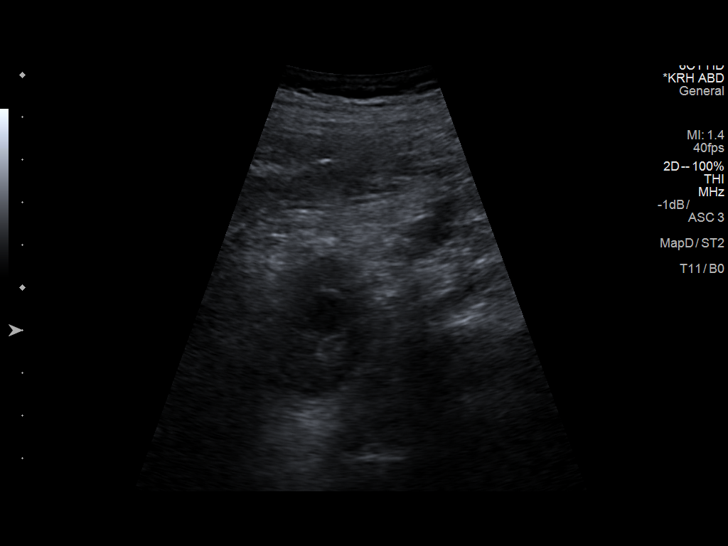
[im 120/120]
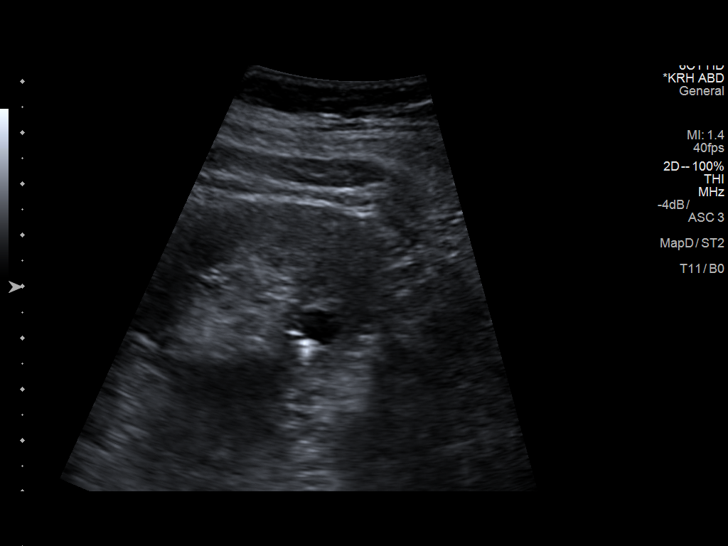

[13 of 25 positions shown; findings below may reference images not displayed]

FINDINGS: Gallbladder: No gallstones or wall thickening visualized. No
sonographic Murphy sign noted by sonographer.

Common bile duct: Diameter: 1.6 mm

Liver: No focal lesion identified. Within normal limits in
parenchymal echogenicity. Portal vein is patent on color Doppler
imaging with normal direction of blood flow towards the liver.

IVC: No abnormality visualized.

Pancreas: Visualized portion unremarkable.

Spleen: Size and appearance within normal limits.

Right Kidney: Length: 10.8 cm. Echogenicity within normal limits. No
mass or hydronephrosis visualized.

Left Kidney: Length: 10.5 cm. Echogenicity within normal limits. No
hydronephrosis. Minimally complicated cysts at the upper and lower
poles measuring approximately 1 cm. Thin, partial peripheral
calcification of the lower pole cyst. Mild internal echoes of the
upper pole cyst.

Abdominal aorta: No aneurysm visualized.

Other findings: None.
IMPRESSION: 1. No acute abdominal abnormality.
2. Small (approximately 1 cm) left kidney upper and lower pole
minimally complicated cysts. No specific follow-up is required.

## 2016-11-16 DIAGNOSIS — J0100 Acute maxillary sinusitis, unspecified: Secondary | ICD-10-CM | POA: Diagnosis not present

## 2016-11-17 ENCOUNTER — Ambulatory Visit: Payer: Self-pay | Admitting: Family Medicine

## 2016-12-07 ENCOUNTER — Ambulatory Visit: Payer: Self-pay | Admitting: Women's Health

## 2017-01-26 DIAGNOSIS — Z01419 Encounter for gynecological examination (general) (routine) without abnormal findings: Secondary | ICD-10-CM | POA: Diagnosis not present

## 2017-01-26 DIAGNOSIS — Z6821 Body mass index (BMI) 21.0-21.9, adult: Secondary | ICD-10-CM | POA: Diagnosis not present

## 2017-02-16 ENCOUNTER — Ambulatory Visit: Payer: Self-pay | Admitting: Hematology

## 2017-02-16 ENCOUNTER — Other Ambulatory Visit: Payer: Self-pay

## 2017-02-16 ENCOUNTER — Encounter: Payer: Self-pay | Admitting: Family Medicine

## 2017-02-16 ENCOUNTER — Ambulatory Visit: Payer: 59 | Admitting: Family Medicine

## 2017-02-16 ENCOUNTER — Ambulatory Visit (HOSPITAL_BASED_OUTPATIENT_CLINIC_OR_DEPARTMENT_OTHER)
Admission: RE | Admit: 2017-02-16 | Discharge: 2017-02-16 | Disposition: A | Discharge status: Home / Self Care | Payer: 59 | Source: Ambulatory Visit | Attending: Family Medicine | Admitting: Family Medicine

## 2017-02-16 VITALS — BP 110/70 | HR 91 | Temp 98.0°F | Resp 16 | Ht 67.72 in | Wt 135.0 lb

## 2017-02-16 DIAGNOSIS — R1013 Epigastric pain: Secondary | ICD-10-CM

## 2017-02-16 DIAGNOSIS — K2900 Acute gastritis without bleeding: Secondary | ICD-10-CM | POA: Diagnosis not present

## 2017-02-16 DIAGNOSIS — N281 Cyst of kidney, acquired: Secondary | ICD-10-CM | POA: Diagnosis not present

## 2017-02-16 DIAGNOSIS — R1011 Right upper quadrant pain: Secondary | ICD-10-CM | POA: Diagnosis not present

## 2017-02-16 LAB — POCT URINALYSIS DIP (MANUAL ENTRY)
Blood, UA: NEGATIVE
Glucose, UA: NEGATIVE mg/dL
Leukocytes, UA: NEGATIVE
Nitrite, UA: NEGATIVE
Protein Ur, POC: NEGATIVE mg/dL
Spec Grav, UA: 1.02 (ref 1.010–1.025)
Urobilinogen, UA: 0.2 E.U./dL
pH, UA: 7 (ref 5.0–8.0)

## 2017-02-16 LAB — POCT URINE PREGNANCY: Preg Test, Ur: NEGATIVE

## 2017-02-16 MED ORDER — OMEPRAZOLE 20 MG PO CPDR: 20.0000 mg | DELAYED_RELEASE_CAPSULE | Freq: Every day | ORAL | 2 refills | Status: DC

## 2017-02-16 MED ORDER — SUCRALFATE 1 GM/10ML PO SUSP: 1.0000 g | Freq: Three times a day (TID) | ORAL | 2 refills | Status: DC

## 2017-02-16 NOTE — Patient Instructions (Addendum)
Please go to [facility] for an US Abdomen today at 2:30.  Nothing to eat or drink before going there.    IF you received an x-ray today, you will receive an invoice from [facility]. Please contact [facility] at [phone] with questions or concerns regarding your invoice.   IF you received labwork today, you will receive an invoice from [facility]. Please contact [facility] at [phone] with questions or concerns regarding your invoice.   Our billing staff will not be able to assist you with questions regarding bills from these companies.  You will be contacted with the lab results as soon as they are available. The fastest way to get your results is to activate your My Chart account. Instructions are located on the last page of this paperwork. If you have not heard from [place] regarding the results in 2 weeks, please contact this office.      Gastritis, Adult Gastritis is inflammation of the stomach. There are two kinds of gastritis:  Acute gastritis. This kind develops suddenly.  Chronic gastritis. This kind lasts for a long time.  Gastritis happens when the lining of the stomach becomes weak or gets damaged. Without treatment, gastritis can lead to stomach bleeding and ulcers. What are the causes? This condition may be caused by:  An infection.  Drinking too much alcohol.  Certain medicines.  Having too much acid in the stomach.  A disease of the intestines or stomach.  Stress.  What are the signs or symptoms? Symptoms of this condition include:  Pain or a burning in the upper abdomen.  Nausea.  Vomiting.  An uncomfortable feeling of fullness after eating.  In some cases, there are no symptoms. How is this diagnosed? This condition may be diagnosed with:  A description of your symptoms.  A physical exam.  Tests. These can include: ? Blood tests. ? Stool tests. ? A test in which a thin, flexible instrument with a light and  camera on the end is passed down the esophagus and into the stomach (upper endoscopy). ? A test in which a sample of tissue is taken for testing (biopsy).  How is this treated? This condition may be treated with medicines. If the condition is caused by a bacterial infection, you may be given antibiotic medicines. If it is caused by too much acid in the stomach, you may get medicines called H2 blockers, proton pump inhibitors, or antacids. Treatment may also involve stopping the use of certain medicines, such as aspirin, ibuprofen, or other nonsteroidal anti-inflammatory drugs (NSAIDs). Follow these instructions at home:  Take over-the-counter and prescription medicines only as told by your health care provider.  If you were prescribed an antibiotic, take it as told by your health care provider. Do not stop taking the antibiotic even if you start to feel better.  Drink enough fluid to keep your urine clear or pale yellow.  Eat small, frequent meals instead of large meals. Contact a health care provider if:  Your symptoms get worse.  Your symptoms return after treatment. Get help right away if:  You vomit blood or material that looks like coffee grounds.  You have black or dark red stools.  You are unable to keep fluids down.  Your abdominal pain gets worse.  You have a fever.  You do not feel better after 1 week. This information is not intended to replace advice given to you by your health care provider. Make sure you discuss any questions you have with  your health care provider. Document Released: 02/16/2001 Document Revised: 10/22/2015 Document Reviewed: 11/16/2014 Elsevier Interactive Patient Education  [employer].

## 2017-02-16 NOTE — Telephone Encounter (Signed)
Patient had first issue around thanksgiving.  Burning in throat and chest.  Took zantac and it improved.  During the inclement weather, she has eaten a bunch of fatty foods.  Developed a dull pain to mid-abdomen, then it turned into Cramping that comes and go developed to upper right and upper mid quadrant  Reason for Disposition . [1] MODERATE pain (e.g., interferes with normal activities) AND [2] pain comes and goes (cramps) AND [3] present > 24 hours  (Exception: pain with Vomiting or Diarrhea - see that Guideline)  Answer Assessment - Initial Assessment Questions 1. LOCATION: "Where does it hurt?"      Right upper and right mid 2. RADIATION: "Does the pain shoot anywhere else?" (e.g., chest, back)    chest 3. ONSET: "When did the pain begin?" (e.g., minutes, hours or days ago)     02/14/17 over in the early morning 4. SUDDEN: "Gradual or sudden onset?"     gradual 5. PATTERN "Does the pain come and go, or is it constant?"    - If constant: "Is it getting better, staying the same, or worsening?"      (Note: Constant means the pain never goes away completely; most serious pain is constant and it progresses)     - If intermittent: "How long does it last?" "Do you have pain now?"     (Note: Intermittent means the pain goes away completely between bouts)    Constant 6. SEVERITY: "How bad is the pain?"  (e.g., Scale 1-10; mild, moderate, or severe)   - MILD (1-3): doesn't interfere with normal activities, abdomen soft and not tender to touch    - MODERATE (4-7): interferes with normal activities or awakens from sleep, tender to touch    - SEVERE (8-10): excruciating pain, doubled over, unable to do any normal activities      10 but it goes away quickly to like a 5 7. RECURRENT SYMPTOM: "Have you ever had this type of abdominal pain before?" If so, ask: "When was the last time?" and "What happened that time?"      Once before around thanksgiving 8. CAUSE: "What do you think is causing the  abdominal pain?"     no 9. RELIEVING/AGGRAVATING FACTORS: "What makes it better or worse?" (e.g., movement, antacids, bowel movement)    nothing 10. OTHER SYMPTOMS: "Has there been any vomiting, diarrhea, constipation, or urine problems?"      Nausea, diarrhea x 1 11. PREGNANCY: "Is there any chance you are pregnant?" "When was your last menstrual period?"    No, had cycle on 01/31/2017  Protocols used: ABDOMINAL PAIN - [facility]

## 2017-02-16 NOTE — Progress Notes (Signed)
Subjective:    Patient ID: Kayla Little, female    DOB: Jul 23, 1985, 31 y.o.   MRN: [id number]  02/16/2017  Abdominal Pain (with some diarrhea and Nausea with sharp pain more on the right side ) and Gastroesophageal Reflux (x 2 weeks )    HPI This 31 y.o. female presents for evaluation of abdominal pain, GERD, nausea, vomiting, diarrhea.  No indigestion or heartburn; usually can eat whatever wants. Chronic loose stools.   After Thanksgiving, had first spell of indigestion.  Had chest pain horribly; vomited that night.  Felt like swallowed pill the wrong way.  Took Zantac and Tums; vomited right afterwards.  After that, subsided; still had throat feeling the next day.  After snow bout, overate; started with sensation in throat.  Felt needed to burp.  Woke up at 2:00am, tried to vomit without success.  Throat and burping feeling this time.  When wok up the next morning, having epigatstric pain intermittent.  Severe intensity; sharp. Occurs every 5-10 minutes; less than 5 seconds.  Pain radiates to mid-back.  As soon as pain hits, radiates to RIGHT side to upper back.  Back pain is now dull and constant.  Located right where bra snaps.  Tight pain.    Nausea with onset two nights ago.  No melena; diarrhea yesterday; no bloody stools.  Appetite is great; Im starving; eating makes worse; chicken noodle soup and crackers.  Worried about gallstones.  Better if laying on side. Mother in law is a physician.  No lower abdominal pain.  Zantac twice daily 150mg .  No more Tums.  No PPI.  Took PeptoBismol two nights ago; still had diarrhea; no improvement.  No Ibuprofen or Aleve to avoid masking fever.  No fever.  Clammy yesterday.  Takes Tylenol here or there.  Alcohol drinks daily; none since symptoms occurred; none in two days.  Husband's birthday during snow storm; two glasses of wine daily.  In past weekend, drank both nights and drank more; 4 glasses of wine plus 2 shots.  Rare caffeine.  No sodas.  Will  drink coffee once weekly. 31 year old.    Trying to get pregnant; LMP 02/01/17.    BP Readings from Last 3 Encounters:  02/16/17 110/70  10/04/14 (!) 104/55  02/20/14 118/70   Wt Readings from Last 3 Encounters:  02/16/17 135 lb (61.2 kg)  10/02/14 161 lb (73 kg)  02/20/14 135 lb (61.2 kg)   There is no immunization history for the selected administration types on file for this patient.  Review of Systems  Constitutional: Negative for chills, diaphoresis, fatigue and fever.  Eyes: Negative for visual disturbance.  Respiratory: Negative for cough and shortness of breath.   Cardiovascular: Negative for chest pain, palpitations and leg swelling.  Gastrointestinal: Positive for abdominal pain and nausea. Negative for abdominal distention, anal bleeding, blood in stool, constipation, diarrhea, rectal pain and vomiting.  Endocrine: Negative for cold intolerance, heat intolerance, polydipsia, polyphagia and polyuria.  Genitourinary: Negative for dysuria, flank pain, frequency, hematuria, pelvic pain, urgency and vaginal discharge.  Neurological: Negative for dizziness, tremors, seizures, syncope, facial asymmetry, speech difficulty, weakness, light-headedness, numbness and headaches.    Past Medical History:  Diagnosis Date  . ADD (attention deficit disorder)   . Breast lump    R breast  . Hx of varicella   . Kidney stone 2012  . Postpartum care following vaginal delivery (7/27) 10/02/2014   Past Surgical History:  Procedure Laterality Date  . NO PAST  SURGERIES     No Known Allergies Current Outpatient Medications on File Prior to Visit  Medication Sig Dispense Refill  . Prenatal Vit-Fe Fumarate-FA (PRENATAL PO) Take 1 tablet by mouth daily.    . sertraline (ZOLOFT) 50 MG tablet     . VYVANSE 20 MG capsule      No current facility-administered medications on file prior to visit.    Social History   Socioeconomic History  . Marital status: Married    Spouse name: Not on  file  . Number of children: Not on file  . Years of education: Not on file  . Highest education level: Not on file  Social Needs  . Financial resource strain: Not on file  . Food insecurity - worry: Not on file  . Food insecurity - inability: Not on file  . Transportation needs - medical: Not on file  . Transportation needs - non-medical: Not on file  Occupational History  . Occupation: homemaker  Tobacco Use  . Smoking status: Current Some Day Smoker    Packs/day: 0.25    Years: 5.00    Pack years: 1.25    Types: Cigarettes  . Smokeless tobacco: Never Used  Substance and Sexual Activity  . Alcohol use: Yes    Alcohol/week: 4.2 - 8.4 oz    Types: 7 - 14 Glasses of wine per week  . Drug use: No  . Sexual activity: Yes    Birth control/protection: None  Other Topics Concern  . Not on file  Social History Narrative  . Not on file   Family History  Problem Relation Age of Onset  . Breast cancer Mother 11       2 types of cancer one side mastectomy with chemo  . Endometriosis Mother   . Cancer Mother 16       Breast cancer  . Endometriosis Maternal Grandmother   . Breast cancer Maternal Grandmother        post menopausal  . Diabetes Maternal Grandmother   . Cancer Maternal Grandfather        brain, lymphoma  . Alzheimer's disease Paternal Grandmother   . Cancer Father 48       prostate cancer       Objective:    BP 110/70   Pulse 91   Temp 98 F (36.7 C) (Oral)   Resp 16   Ht 5' 7.72" (1.72 m)   Wt 135 lb (61.2 kg)   LMP 02/01/2017   SpO2 99%   BMI 20.70 kg/m  Physical Exam  Constitutional: She is oriented to person, place, and time. She appears well-developed and well-nourished. No distress.  HENT:  Head: Normocephalic and atraumatic.  Right Ear: External ear normal.  Left Ear: External ear normal.  Nose: Nose normal.  Mouth/Throat: Oropharynx is clear and moist.  Eyes: Conjunctivae and EOM are normal. Pupils are equal, round, and reactive to light.    Neck: Normal range of motion. Neck supple. Carotid bruit is not present. No thyromegaly present.  Cardiovascular: Normal rate, regular rhythm, normal heart sounds and intact distal pulses. Exam reveals no gallop and no friction rub.  No murmur heard. Pulmonary/Chest: Effort normal and breath sounds normal. She has no wheezes. She has no rales.  Abdominal: Soft. Bowel sounds are normal. She exhibits no distension and no mass. There is tenderness in the right upper quadrant and epigastric area. There is no rebound, no guarding and no CVA tenderness. No hernia. Hernia confirmed negative in the ventral  area.  Lymphadenopathy:    She has no cervical adenopathy.  Neurological: She is alert and oriented to person, place, and time. No cranial nerve deficit.  Skin: Skin is warm and dry. No rash noted. She is not diaphoretic. No erythema. No pallor.  Psychiatric: She has a normal mood and affect. Her behavior is normal.   No results found. No flowsheet data found. No flowsheet data found.      Assessment & Plan:   1. Epigastric pain   2. Other acute gastritis without hemorrhage     New onset epigastric pain following spicy diet and increase alcohol intake.  Dietary modification recommended with patient during visit.  Obtain abdominal ultrasound and labs including H. pylori breath testing.  EKG also obtained and normal.  Differential diagnosis includes GERD, gastritis, peptic ulcer disease, cholelithiasis.  If abdominal ultrasound negative, recommend 70-month trial of omeprazole 20 mg daily.  Carafate also provided to use 1 hour before meals to provide symptomatic relief.  Omeprazole will take 2-3 weeks to become effective.  Recommend decreasing alcohol intake.  Also recommend avoiding spicy foods.  Avoid NSAIDs including ibuprofen, Advil, Aleve, Motrin, naproxen, aspirin.  Orders Placed This Encounter  Procedures  . US Abdomen Complete    Standing Status:   Future    Number of Occurrences:   1     Standing Expiration Date:   04/19/2018    Order Specific Question:   Reason for Exam (SYMPTOM  OR DIAGNOSIS REQUIRED)    Answer:   epigastric and RUQ pain radiating to thoracic back; nausea; GERD; pain with deep inspiration    Order Specific Question:   Preferred imaging location?    Answer:   [id number]  . CBC with Differential/Platelet  . Comprehensive metabolic panel  . H. pylori breath test  . Lipase  . Amylase  . POCT urinalysis dipstick  . POCT urine pregnancy  . EKG 12-Lead   Meds ordered this encounter  Medications  . omeprazole (PRILOSEC) 20 MG capsule    Sig: Take 1 capsule (20 mg total) by mouth at bedtime.    Dispense:  30 capsule    Refill:  2  . sucralfate (CARAFATE) 1 GM/10ML suspension    Sig: Take 10 mLs (1 g total) by mouth 4 (four) times daily -  with meals and at bedtime.    Dispense:  420 mL    Refill:  2    No Follow-up on file.   Kandee Escalante Elayne Guerin, M.D. Primary Care at [facility] [address] [place], [place]  [id number] [phone] phone [phone] fax

## 2017-02-17 LAB — CBC WITH DIFFERENTIAL/PLATELET
Basophils Absolute: 0 10*3/uL (ref 0.0–0.2)
Basos: 0 %
EOS (ABSOLUTE): 0.1 10*3/uL (ref 0.0–0.4)
Eos: 1 %
Hematocrit: 37 % (ref 34.0–46.6)
Hemoglobin: 12.2 g/dL (ref 11.1–15.9)
Immature Grans (Abs): 0 10*3/uL (ref 0.0–0.1)
Immature Granulocytes: 0 %
Lymphocytes Absolute: 2.2 10*3/uL (ref 0.7–3.1)
Lymphs: 23 %
MCH: 32.4 pg (ref 26.6–33.0)
MCHC: 33 g/dL (ref 31.5–35.7)
MCV: 98 fL — ABNORMAL HIGH (ref 79–97)
Monocytes Absolute: 0.8 10*3/uL (ref 0.1–0.9)
Monocytes: 8 %
Neutrophils Absolute: 6.7 10*3/uL (ref 1.4–7.0)
Neutrophils: 68 %
Platelets: 315 10*3/uL (ref 150–379)
RBC: 3.77 x10E6/uL (ref 3.77–5.28)
RDW: 12.8 % (ref 12.3–15.4)
WBC: 9.8 10*3/uL (ref 3.4–10.8)

## 2017-02-17 LAB — COMPREHENSIVE METABOLIC PANEL
ALT: 17 IU/L (ref 0–32)
AST: 16 IU/L (ref 0–40)
Albumin/Globulin Ratio: 2 (ref 1.2–2.2)
Albumin: 4.3 g/dL (ref 3.5–5.5)
Alkaline Phosphatase: 37 IU/L — ABNORMAL LOW (ref 39–117)
BUN/Creatinine Ratio: 12 (ref 9–23)
BUN: 9 mg/dL (ref 6–20)
Bilirubin Total: 0.4 mg/dL (ref 0.0–1.2)
CO2: 24 mmol/L (ref 20–29)
Calcium: 9.2 mg/dL (ref 8.7–10.2)
Chloride: 103 mmol/L (ref 96–106)
Creatinine, Ser: 0.77 mg/dL (ref 0.57–1.00)
GFR calc Af Amer: 117 mL/min/{1.73_m2} (ref >59)
GFR calc non Af Amer: 102 mL/min/{1.73_m2} (ref >59)
Globulin, Total: 2.2 g/dL (ref 1.5–4.5)
Glucose: 97 mg/dL (ref 65–99)
Potassium: 3.9 mmol/L (ref 3.5–5.2)
Sodium: 142 mmol/L (ref 134–144)
Total Protein: 6.5 g/dL (ref 6.0–8.5)

## 2017-02-17 LAB — LIPASE: Lipase: 38 U/L (ref 14–72)

## 2017-02-17 LAB — AMYLASE: Amylase: 51 U/L (ref 31–124)

## 2017-02-18 LAB — H. PYLORI BREATH TEST: H pylori Breath Test: NEGATIVE

## 2017-04-26 DIAGNOSIS — J111 Influenza due to unidentified influenza virus with other respiratory manifestations: Secondary | ICD-10-CM | POA: Diagnosis not present

## 2017-06-29 ENCOUNTER — Telehealth: Payer: Self-pay | Admitting: Obstetrics and Gynecology

## 2017-06-29 NOTE — Telephone Encounter (Signed)
Phone call after hours.  Prior patient of Elon Alas.  Last visit in 2015.  States she is having ovulation type pain.  LMP 06/11/17.  Started spotting one week later.  Bleeding again today.   States she is having pain like a kidney stone.   Has baby 2.5 years ago.  Delivered with [facility].  Had a well woman visit with Dr. Ronita Hipps 6 months ago and states she had a normal pap.   Not on birth control.  Concerned about endometriosis.   I recommended she do a pregnancy test and if it is positive to go [facility] for further care.  She is currently a patient of [facility], so I did recommend she call that office.  I welcomed her to call [facility] tomorrow to make an appointment with Elon Alas if she is not pregnant.

## 2017-07-01 DIAGNOSIS — Z3201 Encounter for pregnancy test, result positive: Secondary | ICD-10-CM | POA: Diagnosis not present

## 2017-07-01 DIAGNOSIS — N921 Excessive and frequent menstruation with irregular cycle: Secondary | ICD-10-CM | POA: Diagnosis not present

## 2017-07-04 DIAGNOSIS — N926 Irregular menstruation, unspecified: Secondary | ICD-10-CM | POA: Diagnosis not present

## 2017-07-06 DIAGNOSIS — N926 Irregular menstruation, unspecified: Secondary | ICD-10-CM | POA: Diagnosis not present

## 2017-07-06 DIAGNOSIS — R1031 Right lower quadrant pain: Secondary | ICD-10-CM | POA: Diagnosis not present

## 2017-07-08 DIAGNOSIS — N926 Irregular menstruation, unspecified: Secondary | ICD-10-CM | POA: Diagnosis not present

## 2017-07-09 ENCOUNTER — Inpatient Hospital Stay (HOSPITAL_COMMUNITY): Payer: 59 | Admitting: Anesthesiology

## 2017-07-09 ENCOUNTER — Ambulatory Visit (HOSPITAL_COMMUNITY)
Admission: AD | Admit: 2017-07-09 | Discharge: 2017-07-09 | Disposition: A | Discharge status: Home / Self Care | Payer: 59 | Source: Ambulatory Visit | Attending: Obstetrics and Gynecology | Admitting: Obstetrics and Gynecology

## 2017-07-09 ENCOUNTER — Encounter (HOSPITAL_COMMUNITY): Payer: Self-pay | Admitting: Anesthesiology

## 2017-07-09 ENCOUNTER — Encounter (HOSPITAL_COMMUNITY)
Admission: AD | Disposition: A | Discharge status: Home / Self Care | Payer: Self-pay | Source: Ambulatory Visit | Attending: Obstetrics and Gynecology

## 2017-07-09 ENCOUNTER — Inpatient Hospital Stay (HOSPITAL_COMMUNITY): Payer: 59

## 2017-07-09 ENCOUNTER — Other Ambulatory Visit: Payer: Self-pay

## 2017-07-09 DIAGNOSIS — O00101 Right tubal pregnancy without intrauterine pregnancy: Secondary | ICD-10-CM | POA: Diagnosis not present

## 2017-07-09 DIAGNOSIS — F17210 Nicotine dependence, cigarettes, uncomplicated: Secondary | ICD-10-CM | POA: Diagnosis not present

## 2017-07-09 DIAGNOSIS — O209 Hemorrhage in early pregnancy, unspecified: Secondary | ICD-10-CM

## 2017-07-09 DIAGNOSIS — O26851 Spotting complicating pregnancy, first trimester: Secondary | ICD-10-CM | POA: Diagnosis not present

## 2017-07-09 DIAGNOSIS — O008 Other ectopic pregnancy without intrauterine pregnancy: Secondary | ICD-10-CM | POA: Diagnosis present

## 2017-07-09 DIAGNOSIS — Z79899 Other long term (current) drug therapy: Secondary | ICD-10-CM | POA: Diagnosis not present

## 2017-07-09 DIAGNOSIS — F988 Other specified behavioral and emotional disorders with onset usually occurring in childhood and adolescence: Secondary | ICD-10-CM | POA: Diagnosis not present

## 2017-07-09 DIAGNOSIS — Z3A00 Weeks of gestation of pregnancy not specified: Secondary | ICD-10-CM | POA: Diagnosis not present

## 2017-07-09 DIAGNOSIS — Z3A01 Less than 8 weeks gestation of pregnancy: Secondary | ICD-10-CM | POA: Diagnosis not present

## 2017-07-09 HISTORY — PX: UNILATERAL SALPINGECTOMY: SHX6160

## 2017-07-09 LAB — CBC WITH DIFFERENTIAL/PLATELET
Basophils Absolute: 0 10*3/uL (ref 0.0–0.1)
Basophils Relative: 0 %
Eosinophils Absolute: 0.1 10*3/uL (ref 0.0–0.7)
Eosinophils Relative: 1 %
HCT: 36.4 % (ref 36.0–46.0)
Hemoglobin: 12.2 g/dL (ref 12.0–15.0)
Lymphocytes Relative: 28 %
Lymphs Abs: 3.4 10*3/uL (ref 0.7–4.0)
MCH: 33.3 pg (ref 26.0–34.0)
MCHC: 33.5 g/dL (ref 30.0–36.0)
MCV: 99.5 fL (ref 78.0–100.0)
Monocytes Absolute: 0.4 10*3/uL (ref 0.1–1.0)
Monocytes Relative: 4 %
Neutro Abs: 8.2 10*3/uL — ABNORMAL HIGH (ref 1.7–7.7)
Neutrophils Relative %: 67 %
Platelets: 310 10*3/uL (ref 150–400)
RBC: 3.66 MIL/uL — ABNORMAL LOW (ref 3.87–5.11)
RDW: 13.2 % (ref 11.5–15.5)
WBC: 12.2 10*3/uL — ABNORMAL HIGH (ref 4.0–10.5)

## 2017-07-09 LAB — TYPE AND SCREEN
ABO/RH(D): O NEG
Antibody Screen: POSITIVE

## 2017-07-09 LAB — POCT PREGNANCY, URINE: Preg Test, Ur: POSITIVE — AB

## 2017-07-09 LAB — HCG, QUANTITATIVE, PREGNANCY: hCG, Beta Chain, Quant, S: 1917 m[IU]/mL — ABNORMAL HIGH (ref <5)

## 2017-07-09 IMAGING — US US OB TRANSVAGINAL
1 series · 15 of 28 positions shown · non-contrast
Comparison: None.

CLINICAL DATA: Spotting and pelvic pain, right greater than left.
Unsure of last menstrual period.

EXAM:
OBSTETRIC <14 WK US AND TRANSVAGINAL OB US
TECHNIQUE: Both transabdominal and transvaginal ultrasound examinations were
performed for complete evaluation of the gestation as well as the
maternal uterus, adnexal regions, and pelvic cul-de-sac.
Transvaginal technique was performed to assess early pregnancy.

[Series 1: us ob transvaginal · 69 acquisitions, 15 frames shown]
[im 1/69]
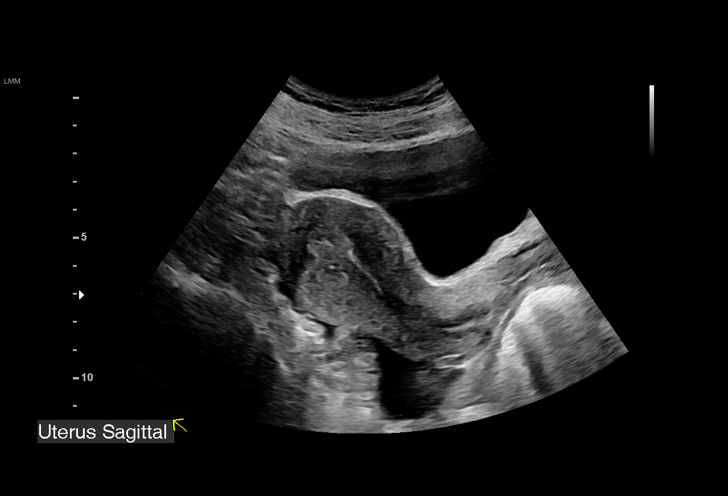
[im 6/69]
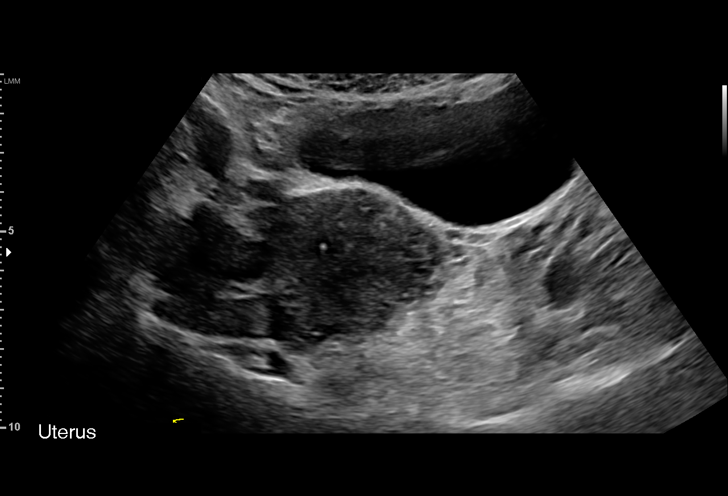
[im 11/69]
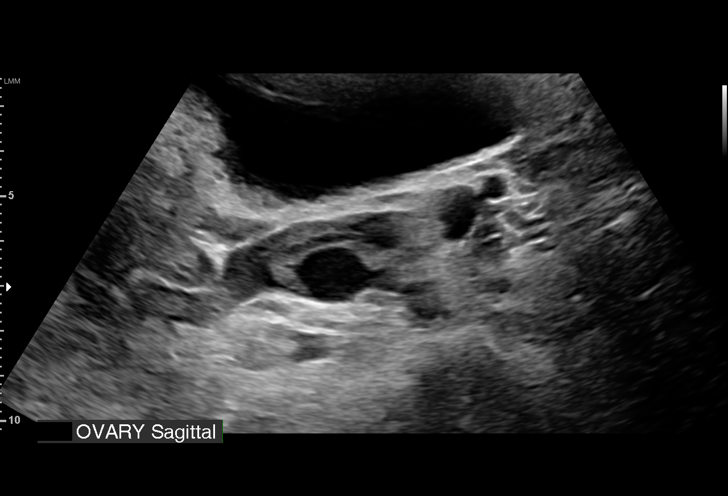
[im 16/69]
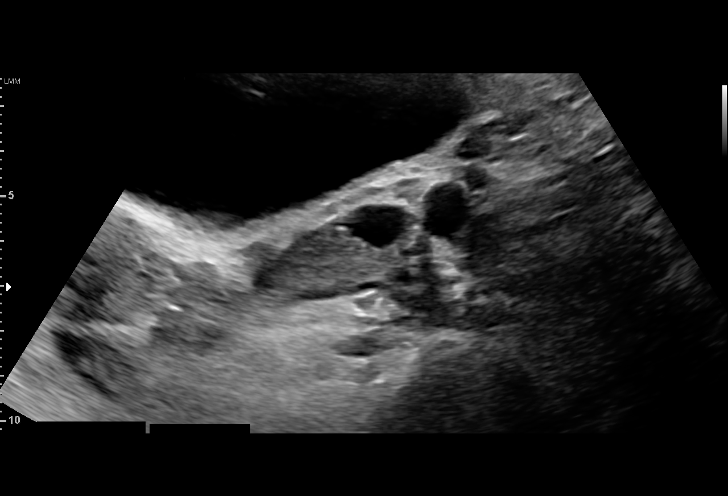
[im 21/69]
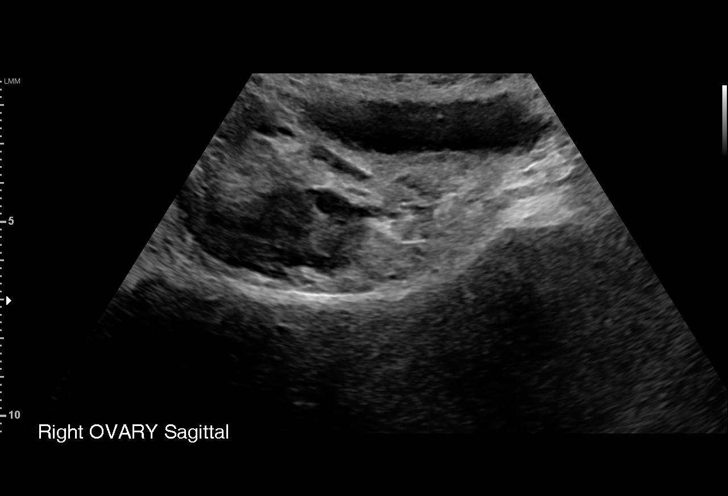
[im 26/69]
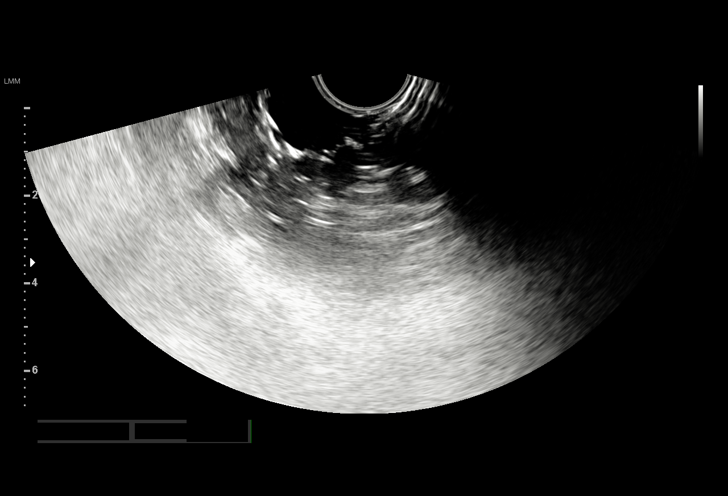
[im 31/69]
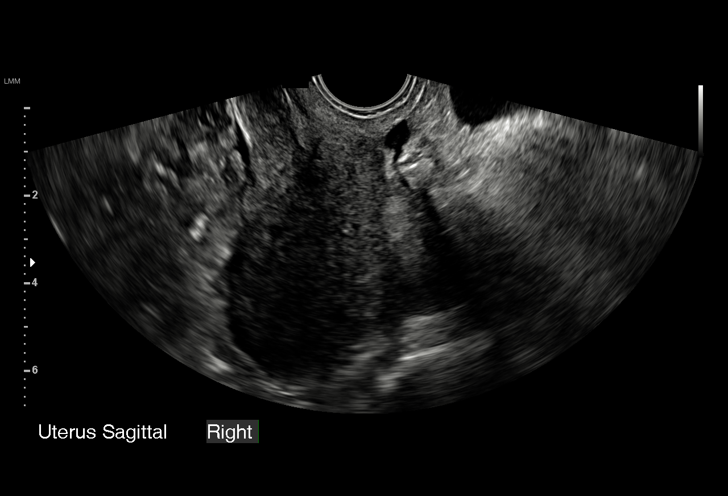
[im 36/69]
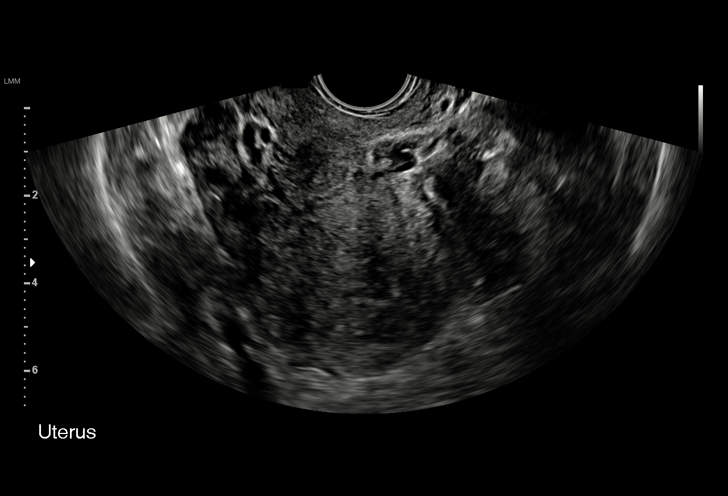
[im 38/69]
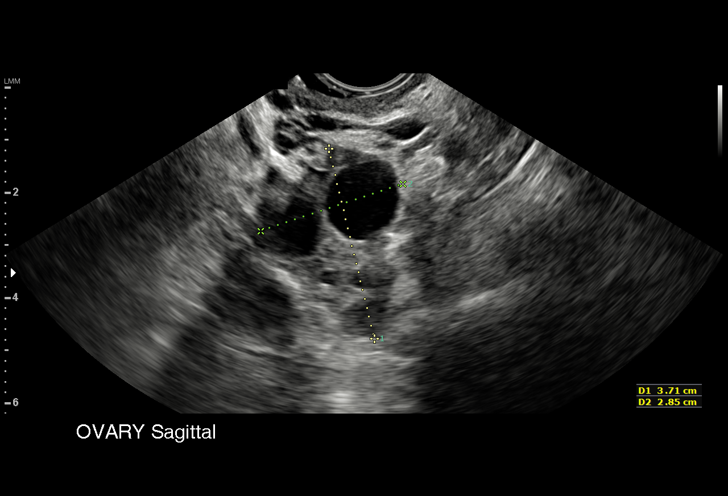
[im 43/69]
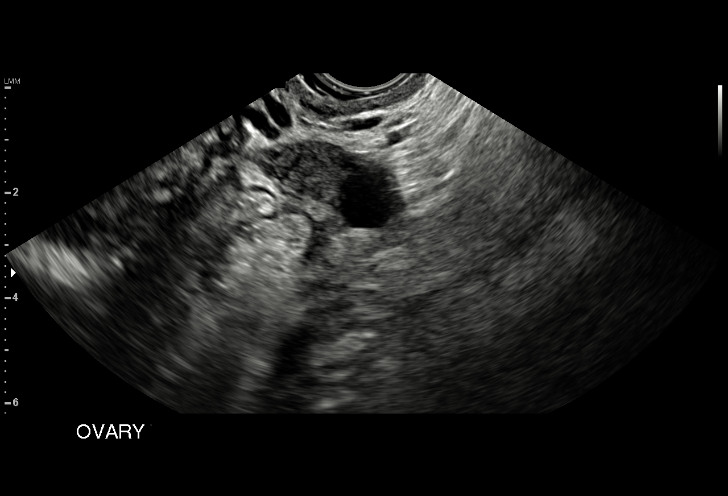
[im 48/69]
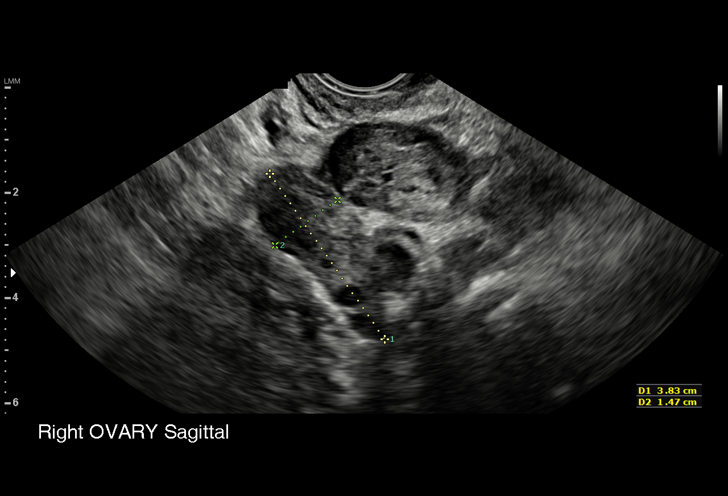
[im 53/69]
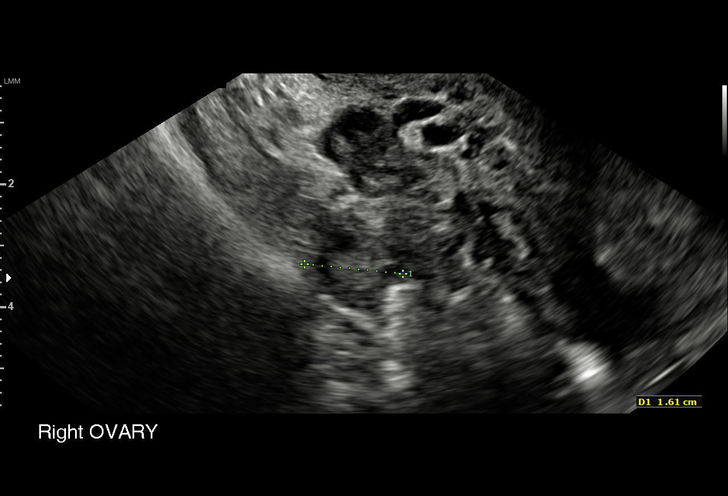
[im 58/69]
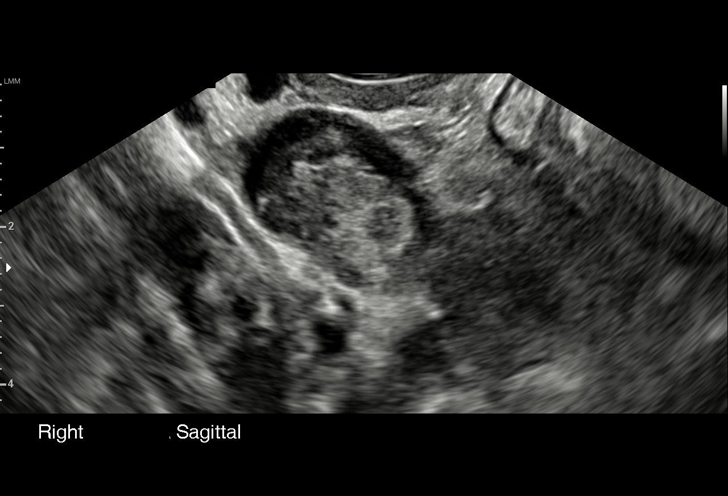
[im 63/69]
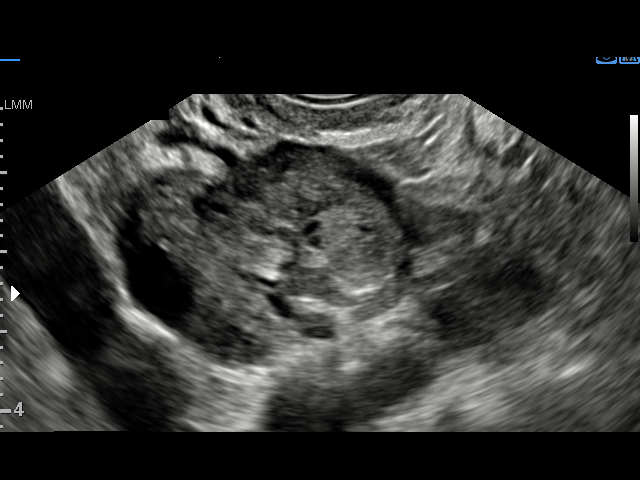
[im 69/69]
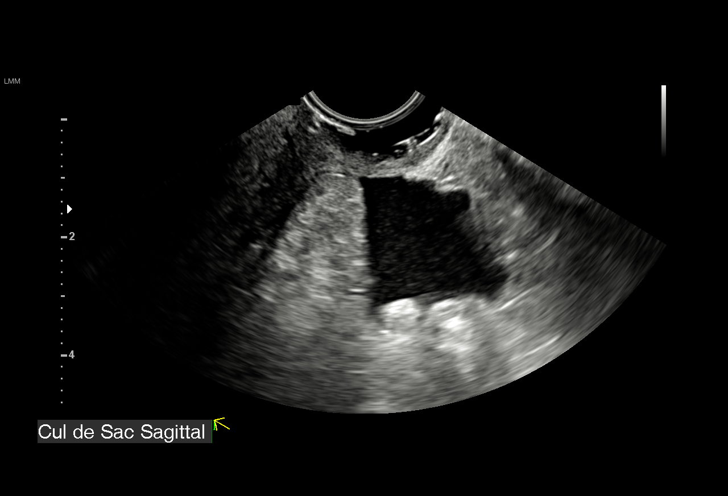

[15 of 28 positions shown; findings below may reference images not displayed]

FINDINGS: Intrauterine gestational sac: None

Maternal uterus/adnexae: Normal appearance of the ovaries. There is
a 2.9 x 1.9 x 1.4 cm complex in echogenicity right adnexal mass,
anteromedially to the right ovary.

The endometrium measures 14 mm in thickness and demonstrates
irregular contour.

Small amount of complex fluid in the posterior cul-de-sac.
IMPRESSION: No intrauterine pregnancy seen.

2.9 cm complex right adnexal mass, adjacent to the right ovary. No
definite gestational sac or fetal pole is seen within this mass,
however this finding is suspicious for an ectopic pregnancy.

Small amount of hemorrhagic fluid within the posterior cul-de-sac.

These results were called by telephone at the time of interpretation
on [DATE] at [DATE] to HANGOBE , who verbally acknowledged
these results.

## 2017-07-09 SURGERY — UNILATERAL SALPINGECTOMY
Anesthesia: General | Site: Abdomen | Laterality: Right

## 2017-07-09 MED ORDER — BUPIVACAINE HCL (PF) 0.25 % IJ SOLN
INTRAMUSCULAR | Status: DC | PRN
  Administered 2017-07-09: 30 mL

## 2017-07-09 MED ORDER — RHO D IMMUNE GLOBULIN 1500 UNIT/2ML IJ SOSY
300.0000 ug | PREFILLED_SYRINGE | Freq: Once | INTRAMUSCULAR | Status: DC
  Filled 2017-07-09: qty 2

## 2017-07-09 MED ORDER — PROPOFOL 10 MG/ML IV BOLUS
INTRAVENOUS | Status: DC | PRN
  Administered 2017-07-09: 180 mg via INTRAVENOUS

## 2017-07-09 MED ORDER — SUCCINYLCHOLINE CHLORIDE 20 MG/ML IJ SOLN
INTRAMUSCULAR | Status: DC | PRN
  Administered 2017-07-09: 100 mg via INTRAVENOUS

## 2017-07-09 MED ORDER — FENTANYL CITRATE (PF) 250 MCG/5ML IJ SOLN
INTRAMUSCULAR | Status: AC
  Filled 2017-07-09: qty 5

## 2017-07-09 MED ORDER — MIDAZOLAM HCL 2 MG/2ML IJ SOLN
INTRAMUSCULAR | Status: DC | PRN
  Administered 2017-07-09: 2 mg via INTRAVENOUS

## 2017-07-09 MED ORDER — HYDROMORPHONE HCL 1 MG/ML IJ SOLN
INTRAMUSCULAR | Status: AC
  Filled 2017-07-09: qty 0.5

## 2017-07-09 MED ORDER — OXYCODONE-ACETAMINOPHEN 5-325 MG PO TABS: 2.0000 | ORAL_TABLET | ORAL | 0 refills | Status: DC | PRN

## 2017-07-09 MED ORDER — SUGAMMADEX SODIUM 200 MG/2ML IV SOLN
INTRAVENOUS | Status: AC
  Filled 2017-07-09: qty 2

## 2017-07-09 MED ORDER — METOCLOPRAMIDE HCL 5 MG/ML IJ SOLN: 10.0000 mg | Freq: Once | INTRAMUSCULAR | Status: DC | PRN

## 2017-07-09 MED ORDER — PROPOFOL 10 MG/ML IV BOLUS
INTRAVENOUS | Status: AC
  Filled 2017-07-09: qty 20

## 2017-07-09 MED ORDER — GLYCOPYRROLATE 0.2 MG/ML IJ SOLN
INTRAMUSCULAR | Status: AC
  Filled 2017-07-09: qty 1

## 2017-07-09 MED ORDER — ROCURONIUM BROMIDE 100 MG/10ML IV SOLN
INTRAVENOUS | Status: AC
  Filled 2017-07-09: qty 1

## 2017-07-09 MED ORDER — ROCURONIUM BROMIDE 100 MG/10ML IV SOLN
INTRAVENOUS | Status: DC | PRN
  Administered 2017-07-09: 30 mg via INTRAVENOUS

## 2017-07-09 MED ORDER — SODIUM CHLORIDE 0.9 % IR SOLN
Status: DC | PRN
  Administered 2017-07-09: 3000 mL

## 2017-07-09 MED ORDER — DEXAMETHASONE SODIUM PHOSPHATE 10 MG/ML IJ SOLN
INTRAMUSCULAR | Status: DC | PRN
  Administered 2017-07-09: 10 mg via INTRAVENOUS

## 2017-07-09 MED ORDER — LIDOCAINE HCL (CARDIAC) PF 100 MG/5ML IV SOSY
PREFILLED_SYRINGE | INTRAVENOUS | Status: DC | PRN
  Administered 2017-07-09: 60 mg via INTRAVENOUS

## 2017-07-09 MED ORDER — GLYCOPYRROLATE 0.2 MG/ML IJ SOLN
INTRAMUSCULAR | Status: DC | PRN
  Administered 2017-07-09: 0.1 mg via INTRAVENOUS

## 2017-07-09 MED ORDER — MEPERIDINE HCL 25 MG/ML IJ SOLN: 6.2500 mg | INTRAMUSCULAR | Status: DC | PRN

## 2017-07-09 MED ORDER — SUGAMMADEX SODIUM 200 MG/2ML IV SOLN
INTRAVENOUS | Status: DC | PRN
  Administered 2017-07-09: 200 mg via INTRAVENOUS

## 2017-07-09 MED ORDER — FENTANYL CITRATE (PF) 100 MCG/2ML IJ SOLN
INTRAMUSCULAR | Status: AC
  Filled 2017-07-09: qty 2

## 2017-07-09 MED ORDER — FENTANYL CITRATE (PF) 100 MCG/2ML IJ SOLN
INTRAMUSCULAR | Status: DC | PRN
  Administered 2017-07-09: 100 ug via INTRAVENOUS
  Administered 2017-07-09: 50 ug via INTRAVENOUS
  Administered 2017-07-09 (×2): 100 ug via INTRAVENOUS

## 2017-07-09 MED ORDER — HYDROMORPHONE HCL 1 MG/ML IJ SOLN
0.2500 mg | INTRAMUSCULAR | Status: DC | PRN
  Administered 2017-07-09: 0.5 mg via INTRAVENOUS

## 2017-07-09 MED ORDER — IBUPROFEN 200 MG PO TABS: 600.0000 mg | ORAL_TABLET | Freq: Four times a day (QID) | ORAL | 0 refills | Status: DC | PRN

## 2017-07-09 MED ORDER — ONDANSETRON HCL 4 MG/2ML IJ SOLN
INTRAMUSCULAR | Status: AC
  Filled 2017-07-09: qty 2

## 2017-07-09 MED ORDER — LACTATED RINGERS IV SOLN
INTRAVENOUS | Status: DC
  Administered 2017-07-09 (×3): via INTRAVENOUS

## 2017-07-09 MED ORDER — ONDANSETRON HCL 4 MG/2ML IJ SOLN
INTRAMUSCULAR | Status: DC | PRN
  Administered 2017-07-09: 4 mg via INTRAVENOUS

## 2017-07-09 MED ORDER — LIDOCAINE HCL (CARDIAC) PF 100 MG/5ML IV SOSY
PREFILLED_SYRINGE | INTRAVENOUS | Status: AC
  Filled 2017-07-09: qty 5

## 2017-07-09 MED ORDER — DEXAMETHASONE SODIUM PHOSPHATE 10 MG/ML IJ SOLN
INTRAMUSCULAR | Status: AC
  Filled 2017-07-09: qty 1

## 2017-07-09 MED ORDER — MIDAZOLAM HCL 2 MG/2ML IJ SOLN
INTRAMUSCULAR | Status: AC
  Filled 2017-07-09: qty 2

## 2017-07-09 SURGICAL SUPPLY — 31 items
ADH SKN CLS APL DERMABOND .7 (GAUZE/BANDAGES/DRESSINGS) ×2
BAG SPEC RTRVL LRG 6X4 10 (ENDOMECHANICALS)
CABLE HIGH FREQUENCY MONO STRZ (ELECTRODE) IMPLANT
CATH ROBINSON RED A/P 16FR (CATHETERS) ×3 IMPLANT
DERMABOND ADVANCED (GAUZE/BANDAGES/DRESSINGS) ×1
DERMABOND ADVANCED .7 DNX12 (GAUZE/BANDAGES/DRESSINGS) ×2 IMPLANT
DRSG OPSITE POSTOP 3X4 (GAUZE/BANDAGES/DRESSINGS) IMPLANT
DURAPREP 26ML APPLICATOR (WOUND CARE) ×3 IMPLANT
FILTER SMOKE EVAC LAPAROSHD (FILTER) IMPLANT
GLOVE BIO SURGEON STRL SZ7 (GLOVE) ×3 IMPLANT
GLOVE BIOGEL PI IND STRL 7.0 (GLOVE) ×4 IMPLANT
GLOVE BIOGEL PI INDICATOR 7.0 (GLOVE) ×2
GOWN STRL REUS W/TWL LRG LVL3 (GOWN DISPOSABLE) ×9 IMPLANT
LIGASURE VESSEL 5MM BLUNT TIP (ELECTROSURGICAL) IMPLANT
MANIPULATOR UTERINE 4.5 ZUMI (MISCELLANEOUS) ×3 IMPLANT
NS IRRIG 1000ML POUR BTL (IV SOLUTION) ×3 IMPLANT
PACK LAPAROSCOPY BASIN (CUSTOM PROCEDURE TRAY) ×3 IMPLANT
PACK TRENDGUARD 450 HYBRID PRO (MISCELLANEOUS) ×1 IMPLANT
POUCH SPECIMEN RETRIEVAL 10MM (ENDOMECHANICALS) IMPLANT
PROTECTOR NERVE ULNAR (MISCELLANEOUS) ×6 IMPLANT
SCISSORS LAP 5X35 DISP (ENDOMECHANICALS) IMPLANT
SET IRRIG TUBING LAPAROSCOPIC (IRRIGATION / IRRIGATOR) IMPLANT
SLEEVE XCEL OPT CAN 5 100 (ENDOMECHANICALS) ×5 IMPLANT
SOLUTION ELECTROLUBE (MISCELLANEOUS) IMPLANT
SUT VICRYL 0 UR6 27IN ABS (SUTURE) ×3 IMPLANT
SUT VICRYL 4-0 PS2 18IN ABS (SUTURE) ×3 IMPLANT
TOWEL OR 17X24 6PK STRL BLUE (TOWEL DISPOSABLE) ×6 IMPLANT
TRENDGUARD 450 HYBRID PRO PACK (MISCELLANEOUS) ×3
TROCAR BALLN 12MMX100 BLUNT (TROCAR) IMPLANT
TROCAR XCEL NON-BLD 5MMX100MML (ENDOMECHANICALS) ×3 IMPLANT
WARMER LAPAROSCOPE (MISCELLANEOUS) ×3 IMPLANT

## 2017-07-09 NOTE — MAU Note (Addendum)
Early pregnant with cramping and bleeding. Thinks its "ectopic pregnancy"   Seen past Friday at dr. Kennith Maes office. Numerous Hcg levels done in which seen increases. First hcg level 789, next 1390, wed 1600, Friday 2300.   Right abldominal Pain started this morning with spot bleeding.   U/s paged Pt to U/S  1540: labs and IV placed. LR bolus   1556: MD at bs going over [place] and pt consented for surgery  1610: anesthesia at bs discussing POC during surgery and consent signed  1630: pt to OR waiting area via wheelchair. Dr. Benjie Karvonen ok for pt to be transferred via wheelchair since pt is stable.   1638: relinquished care over to CRNA.

## 2017-07-09 NOTE — Discharge Instructions (Signed)
Ruptured Ectopic Pregnancy °An ectopic pregnancy is when the fertilized egg attaches (implants) outside the uterus. Most ectopic pregnancies occur in the fallopian tube. Rarely do ectopic pregnancies occur on the ovary, intestine, pelvis, or cervix. An ectopic pregnancy does not have the ability to develop into a normal, healthy baby. °A ruptured ectopic pregnancy is one in which the fallopian tube gets torn or bursts and results in internal bleeding. Often there is intense abdominal pain, and sometimes, vaginal bleeding. Having an ectopic pregnancy can be a life-threatening experience. If left untreated, this dangerous condition can lead to a blood transfusion, abdominal surgery, or even death. °What are the causes? °Damage to the fallopian tubes is the suspected cause in most ectopic pregnancies. °What increases the risk? °Depending on your circumstances, the amount of risk of having an ectopic pregnancy will vary. There are 3 categories that may help you identify whether you are potentially at risk. °High Risk °· You have gone through infertility treatment. °· You have had a previous ectopic pregnancy. °· You have had previous tubal surgery. °· You have had previous surgery to have the fallopian tubes tied (tubal ligation). °· You have tubal problems or diseases. °· You have been exposed to DES. DES is a medicine that was used until 1971 and had effects on babies whose mothers took the medicine. °· You become pregnant while using an intrauterine device (IUD) for birth control. ° °Moderate Risk °· You have a history of infertility. °· You have a history of a sexually transmitted infection (STI). °· You have a history of pelvic inflammatory disease (PID). °· You have scarring from endometriosis. °· You have multiple sexual partners. °· You smoke. ° °Low Risk °· You have had previous pelvic surgery. °· You use vaginal douching. °· You became sexually active before 34 years of age. ° °What are the signs or  symptoms? °An ectopic pregnancy should be suspected in anyone who has missed a period and has abdominal pain or bleeding. °· You may experience normal pregnancy symptoms, such as: °? Nausea. °? Tiredness. °? Breast tenderness. °· Symptoms that are not normal include: °? Pain with intercourse. °? Irregular vaginal bleeding or spotting. °? Cramping or pain on one side, or in the lower abdomen. °? Fast heartbeat. °? Passing out while having a bowel movement. °· Symptoms of a ruptured ectopic pregnancy and internal bleeding may include: °? Sudden, severe pain in the abdomen and pelvis. °? Dizziness or fainting. °? Pain in the shoulder area. ° °How is this diagnosed? °Tests that may be performed include: °· A pregnancy test. °· An ultrasound. °· Testing the specific level of pregnancy hormone in the bloodstream. °· Taking a sample of uterus tissue (dilation and curettage, D&C). °· Surgery to perform a visual exam of the inside of the abdomen using a lighted tube (laparoscopy). ° °How is this treated? °Laparoscopic surgery or abdominal surgery is recommended for a ruptured ectopic pregnancy. °· The whole fallopian tube may need to be removed (salpingectomy). °· If the tube is not too damaged, the tube may be saved, and the pregnancy will be surgically removed. In time, the tube may still function. °· If you have lost a lot of blood, you may need a blood transfusion. °· You may receive a Rho (D) immune globulin shot if you are Rh negative and the father is Rh positive, or if you do not know the Rh type of the father. This is to prevent problems with any future pregnancy. ° °Get help right   away if: °You have any symptoms of an ectopic or ruptured ectopic pregnancy. This is a medical emergency. °This information is not intended to replace advice given to you by your health care provider. Make sure you discuss any questions you have with your health care provider. °Document Released: 02/20/2000 Document Revised: 09/12/2015  Document Reviewed: 12/04/2012 °Elsevier Interactive Patient Education © 2018 [employer] Inc. ° °

## 2017-07-09 NOTE — Anesthesia Procedure Notes (Signed)
Procedure Name: Intubation Date/Time: 07/09/2017 4:45 PM Performed by: Genevie Ann, CRNA Pre-anesthesia Checklist: Patient identified, Emergency Drugs available, Suction available, Patient being monitored and Timeout performed Patient Re-evaluated:Patient Re-evaluated prior to induction Oxygen Delivery Method: Circle system utilized Preoxygenation: Pre-oxygenation with 100% oxygen Induction Type: IV induction Laryngoscope Size: Mac and 3 Grade View: Grade I Tube size: 7.0 mm Number of attempts: 1 Placement Confirmation: ETT inserted through vocal cords under direct vision,  positive ETCO2 and breath sounds checked- equal and bilateral Secured at: 21 cm Dental Injury: Teeth and Oropharynx as per pre-operative assessment

## 2017-07-09 NOTE — H&P (Addendum)
Kayla Little is an 32 y.o. female G2P1001, here for right lower quadrant pain, known abnormal rising quant HCGs in office with close observation. Slight bleeding last night and pain today which prompted her to come to MAU.  Sono- no IUP, right adnexal mass, small free fluid, c/w ruptured ectopic pregnancy  Prior SVD. No PID hx. Smoker.   No LMP recorded.    Past Medical History:  Diagnosis Date  . ADD (attention deficit disorder)   . Breast lump    R breast  . Hx of varicella   . Kidney stone 2012  . Postpartum care following vaginal delivery (7/27) 10/02/2014    Past Surgical History:  Procedure Laterality Date  . NO PAST SURGERIES      Family History  Problem Relation Age of Onset  . Breast cancer Mother 68       2 types of cancer one side mastectomy with chemo  . Endometriosis Mother   . Cancer Mother 47       Breast cancer  . Endometriosis Maternal Grandmother   . Breast cancer Maternal Grandmother        post menopausal  . Diabetes Maternal Grandmother   . Cancer Maternal Grandfather        brain, lymphoma  . Alzheimer's disease Paternal Grandmother   . Cancer Father 9       prostate cancer    Social History:  reports that she has been smoking cigarettes.  She has a 1.25 pack-year smoking history. She has never used smokeless tobacco. She reports that she drinks about 4.2 - 8.4 oz of alcohol per week. She reports that she does not use drugs.  Allergies: No Known Allergies  Medications Prior to Admission  Medication Sig Dispense Refill Last Dose  . omeprazole (PRILOSEC) 20 MG capsule Take 1 capsule (20 mg total) by mouth at bedtime. 30 capsule 2   . Prenatal Vit-Fe Fumarate-FA (PRENATAL PO) Take 1 tablet by mouth daily.   Taking  . sertraline (ZOLOFT) 50 MG tablet    Taking  . sucralfate (CARAFATE) 1 GM/10ML suspension Take 10 mLs (1 g total) by mouth 4 (four) times daily -  with meals and at bedtime. 420 mL 2   . VYVANSE 20 MG capsule    Taking     ROS  Blood pressure 118/70, pulse 86, temperature 98.3 F (36.8 C), temperature source Oral, resp. rate 18, unknown if currently breastfeeding. Physical Exam A&O x 3, no acute distress. Pleasant HEENT neg, no thyromegaly Lungs CTA bilat CV RRR, S1S2 normal Abdo soft, non tender, non acute Extr no edema/ tenderness Pelvic deferred  Results for orders placed or performed during the hospital encounter of 07/09/17 (from the past 24 hour(s))  Pregnancy, urine POC     Status: Abnormal   Collection Time: 07/09/17  2:24 PM  Result Value Ref Range   Preg Test, Ur POSITIVE (A) NEGATIVE  CBC with Differential/Platelet     Status: Abnormal   Collection Time: 07/09/17  3:35 PM  Result Value Ref Range   WBC 12.2 (H) 4.0 - 10.5 K/uL   RBC 3.66 (L) 3.87 - 5.11 MIL/uL   Hemoglobin 12.2 12.0 - 15.0 g/dL   HCT 36.4 36.0 - 46.0 %   MCV 99.5 78.0 - 100.0 fL   MCH 33.3 26.0 - 34.0 pg   MCHC 33.5 30.0 - 36.0 g/dL   RDW 13.2 11.5 - 15.5 %   Platelets 310 150 - 400 K/uL   Neutrophils Relative %  67 %   Neutro Abs 8.2 (H) 1.7 - 7.7 K/uL   Lymphocytes Relative 28 %   Lymphs Abs 3.4 0.7 - 4.0 K/uL   Monocytes Relative 4 %   Monocytes Absolute 0.4 0.1 - 1.0 K/uL   Eosinophils Relative 1 %   Eosinophils Absolute 0.1 0.0 - 0.7 K/uL   Basophils Relative 0 %   Basophils Absolute 0.0 0.0 - 0.1 K/uL    US Ob Comp Less 14 Wks  Result Date: 07/09/2017 CLINICAL DATA:  Spotting and pelvic pain, right greater than left. Unsure of last menstrual period. EXAM: OBSTETRIC <14 WK [place] AND TRANSVAGINAL OB US TECHNIQUE: Both transabdominal and transvaginal ultrasound examinations were performed for complete evaluation of the gestation as well as the maternal uterus, adnexal regions, and pelvic cul-de-sac. Transvaginal technique was performed to assess early pregnancy. COMPARISON:  None. FINDINGS: Intrauterine gestational sac: None Maternal uterus/adnexae: Normal appearance of the ovaries. There is a 2.9 x 1.9 x 1.4  cm complex in echogenicity right adnexal mass, anteromedially to the right ovary. The endometrium measures 14 mm in thickness and demonstrates irregular contour. Small amount of complex fluid in the posterior cul-de-sac. IMPRESSION: No intrauterine pregnancy seen. 2.9 cm complex right adnexal mass, adjacent to the right ovary. No definite gestational sac or fetal pole is seen within this mass, however this finding is suspicious for an ectopic pregnancy. Small amount of hemorrhagic fluid within the posterior cul-de-sac. These results were called by telephone at the time of interpretation on 07/09/2017 at 3:35 pm to Daiva Nakayama , who verbally acknowledged these results. Electronically Signed   By: Fidela Salisbury M.D.   On: 07/09/2017 15:49   US Ob Transvaginal  Result Date: 07/09/2017 CLINICAL DATA:  Spotting and pelvic pain, right greater than left. Unsure of last menstrual period. EXAM: OBSTETRIC <14 WK [place] AND TRANSVAGINAL OB US TECHNIQUE: Both transabdominal and transvaginal ultrasound examinations were performed for complete evaluation of the gestation as well as the maternal uterus, adnexal regions, and pelvic cul-de-sac. Transvaginal technique was performed to assess early pregnancy. COMPARISON:  None. FINDINGS: Intrauterine gestational sac: None Maternal uterus/adnexae: Normal appearance of the ovaries. There is a 2.9 x 1.9 x 1.4 cm complex in echogenicity right adnexal mass, anteromedially to the right ovary. The endometrium measures 14 mm in thickness and demonstrates irregular contour. Small amount of complex fluid in the posterior cul-de-sac. IMPRESSION: No intrauterine pregnancy seen. 2.9 cm complex right adnexal mass, adjacent to the right ovary. No definite gestational sac or fetal pole is seen within this mass, however this finding is suspicious for an ectopic pregnancy. Small amount of hemorrhagic fluid within the posterior cul-de-sac. These results were called by telephone at the time of  interpretation on 07/09/2017 at 3:35 pm to Daiva Nakayama , who verbally acknowledged these results. Electronically Signed   By: Fidela Salisbury M.D.   On: 07/09/2017 15:49    Assessment/Plan: Pregnant female, no IUP, right adnexal mass, free fluid in pelvis, pain- c/w right ruptured adnexal ectopic pregnancy with hemoperitoneum Options - Laparoscopy, right salpingectomy vs Methotrexate which would not be my first recommendation due to rupture and pain, patient agrees, she rather not have methotrexate.  Loss of right tube with diminished fertility reviewed. Also reviewed salpingostomy option and data with increased ectopic pregnancy right from right as well as left ectopic risk as well.  Plan on quitting smoking.   Risks/complications of surgery reviewed incl infection, bleeding, damage to internal organs including bladder, bowels, ureters, blood vessels, other risks  from anesthesia, VTE and delayed complications of any surgery, complications in future surgery reviewed.   Elveria Royals 07/09/2017, 4:20 PM

## 2017-07-09 NOTE — Op Note (Signed)
Preoperative diagnosis: Right adnexal ectopic pregnancy with hemoperitoneum Postoperative diagnosis: Right tubal ruptured ectopic pregnancy with hemoperitoneum Procedure: Laparoscopic right salpingectomy  Surgeon: Dr Azucena Fallen, MD Assistants: Derrell Lolling, CNM  Anesthesia Gen. Endotracheal IV fluids LR  1800 cc LR EBL minimal  50 cc Urine clear  550 cc clear in foley Complications none Disposition PACU and home Specimens Right fallopian tube with ectopic pregnancy  Procedure  Patient is 32 yo G2P1001, with abnormal rise in quant HCG and was monitored closely in office with instructions for possible ectopic pregnancy, so presented today with right lower quadrant pain that was not present until 30 minutes before presenting. Pelvic ultrasound was consistent with right adnexal mass and small pelvic fluid, suggesting right ruptured ectopic pregnancy.  Options were reviewed, we chose to proceed with laparoscopy with possible right salpingectomy. Risk and complications of surgery including infection, bleeding, damage to internal organs, other complications including pneumonia, VTE were reviewed. Informed written consent was obtained and patient was brought to the operating room with IV running. Timeout was carried out.   She underwent general anesthesia without difficulty and was given dorsal lithotomy position. She was prepped and draped in standard fashion and foley was placed. Uterine manipulation was placed. 10 mm incision made at the umbilicus's lower edge after injecting marcaine. Fascia grasped and incised. Peritoneum entered bluntly with finger dissection. Hassan canula with balloon placed, balloon inflated. Pneumoperitoneum created.  Hemoperitoneum noted. 2 lower incisions made after marcaine injection, 5 mm trocars placed under vision. Hemoperitoneum aspirated.  Right tube with ectopic pregnancy noted and both ovaries and left tube were normal. There were no adhesions or endometriosis.  Right salpingectomy performed using Ligasure. Endobag placed and specimen dropped in it and it was removed securely from central port.  Hemoperitoneum aspirated, peritoneal cavity irrigated. Hemostasis noted. Appendix, liver also normal.   All ports removed under vision and pneumperitoneum released. Central port fascia closed with 0-Vicryl and all skin incisions closed with 4-0 Vicryl. Dressing placed in central port and Dermabond on lower incisions.  Foley removed. Uterine manipulator and Tenaculum removed. Bleeding minimal.   All counts were correct x2. Patient brought out to the recovery room in stable condition after extubation.  Plan is to discharge home from recovery room.  Surgical findings were discussed with patient.  I performed the surgery.   V.Indalecio Malmstrom, MD

## 2017-07-09 NOTE — Anesthesia Postprocedure Evaluation (Signed)
Anesthesia Post Note  Patient: Kayla Little  Procedure(s) Performed: RIGHT SALPINGECTOMY WITH REMOVAL OF ECTOPIC PREGNANCY (Right Abdomen)     Patient location during evaluation: PACU Anesthesia Type: General Level of consciousness: awake and alert and oriented Pain management: pain level controlled Vital Signs Assessment: post-procedure vital signs reviewed and stable Respiratory status: spontaneous breathing, nonlabored ventilation and respiratory function stable Cardiovascular status: blood pressure returned to baseline and stable Postop Assessment: no apparent nausea or vomiting Anesthetic complications: no    Last Vitals:  Vitals:   07/09/17 1830 07/09/17 1845  BP: 115/85 115/84  Pulse: 84 83  Resp: 17 16  Temp:  37.1 C  SpO2: 98% 99%    Last Pain:  Vitals:   07/09/17 1845  TempSrc:   PainSc: 4    Pain Goal:                 Magaline Steinberg A.

## 2017-07-09 NOTE — Anesthesia Preprocedure Evaluation (Signed)
Anesthesia Evaluation  Patient identified by MRN, date of birth, ID band Patient awake    Reviewed: Allergy & Precautions, NPO status , Patient's Chart, lab work & pertinent test results  Airway Mallampati: II  TM Distance: >3 FB Neck ROM: Full    Dental no notable dental hx. (+) Teeth Intact   Pulmonary Current Smoker,    Pulmonary exam normal breath sounds clear to auscultation       Cardiovascular negative cardio ROS Normal cardiovascular exam Rhythm:Regular Rate:Normal     Neuro/Psych PSYCHIATRIC DISORDERS ADDnegative neurological ROS     GI/Hepatic negative GI ROS, Neg liver ROS,   Endo/Other    Renal/GU Renal diseaseHx/o renal calculi  negative genitourinary   Musculoskeletal negative musculoskeletal ROS (+)   Abdominal   Peds  Hematology   Anesthesia Other Findings   Reproductive/Obstetrics (+) Pregnancy Right ectopic pregnancy                             Anesthesia Physical Anesthesia Plan  ASA: II and emergent  Anesthesia Plan: General   Post-op Pain Management:    Induction: Intravenous, Cricoid pressure planned and Rapid sequence  PONV Risk Score and Plan: 4 or greater and Scopolamine patch - Pre-op, Midazolam, Ondansetron, Dexamethasone and Treatment may vary due to age or medical condition  Airway Management Planned: Oral ETT  Additional Equipment:   Intra-op Plan:   Post-operative Plan: Extubation in OR  Informed Consent: I have reviewed the patients History and Physical, chart, labs and discussed the procedure including the risks, benefits and alternatives for the proposed anesthesia with the patient or authorized representative who has indicated his/her understanding and acceptance.   Dental advisory given  Plan Discussed with: CRNA, Anesthesiologist and Surgeon  Anesthesia Plan Comments:         Anesthesia Quick Evaluation

## 2017-07-09 NOTE — MAU Provider Note (Signed)
History   G2P1001 @ apx [redacted] wks gestation in with spotting since last night and righht side abd pain. States was told by Dr. Ronita Hipps she might have ectopic pregnancy.  CSN: [id number]  Arrival date & time 07/09/17  1354   None     Chief Complaint  Patient presents with  . Abdominal Pain    HPI  Past Medical History:  Diagnosis Date  . ADD (attention deficit disorder)   . Breast lump    R breast  . Hx of varicella   . Kidney stone 2012  . Postpartum care following vaginal delivery (7/27) 10/02/2014    Past Surgical History:  Procedure Laterality Date  . NO PAST SURGERIES      Family History  Problem Relation Age of Onset  . Breast cancer Mother 59       2 types of cancer one side mastectomy with chemo  . Endometriosis Mother   . Cancer Mother 82       Breast cancer  . Endometriosis Maternal Grandmother   . Breast cancer Maternal Grandmother        post menopausal  . Diabetes Maternal Grandmother   . Cancer Maternal Grandfather        brain, lymphoma  . Alzheimer's disease Paternal Grandmother   . Cancer Father 76       prostate cancer    Social History   Tobacco Use  . Smoking status: Current Some Day Smoker    Packs/day: 0.25    Years: 5.00    Pack years: 1.25    Types: Cigarettes  . Smokeless tobacco: Never Used  Substance Use Topics  . Alcohol use: Yes    Alcohol/week: 4.2 - 8.4 oz    Types: 7 - 14 Glasses of wine per week  . Drug use: No    OB History    Gravida  1   Para  1   Term  1   Preterm      AB      Living  1     SAB      TAB      Ectopic      Multiple  0   Live Births  1           Review of Systems  Constitutional: Negative.   HENT: Negative.   Eyes: Negative.   Respiratory: Negative.   Cardiovascular: Negative.   Gastrointestinal: Positive for abdominal pain.  Endocrine: Negative.   Genitourinary: Positive for vaginal bleeding.  Musculoskeletal: Negative.   Skin: Negative.   Allergic/Immunologic: Negative.    Neurological: Negative.   Hematological: Negative.   Psychiatric/Behavioral: Negative.     Allergies  Patient has no known allergies.  Home Medications    There were no vitals taken for this visit.  Physical Exam  Constitutional: She is oriented to person, place, and time. She appears well-developed and well-nourished.  HENT:  Head: Normocephalic.  Pulmonary/Chest: Effort normal and breath sounds normal.  Abdominal: Normal appearance and bowel sounds are normal. There is tenderness in the right lower quadrant.  Genitourinary: Vagina normal.  Neurological: She is alert and oriented to person, place, and time.  Skin: Skin is warm and dry.  Psychiatric: She has a normal mood and affect. Her behavior is normal.    MAU Course  Procedures (including critical care time)  Labs Reviewed  POCT PREGNANCY, URINE - Abnormal; Notable for the following components:      Result Value   Preg Test, Ur  POSITIVE (*)    All other components within normal limits  RH IG WORKUP (INCLUDES ABO/RH)   No results found.   1. Vaginal bleeding in pregnancy, first trimester       MDM  Vss. US shows r ectopic preg with sm amt blood in abd per radiology. Dr. Benjie Karvonen on her way in to see pt. Will start and get labs.

## 2017-07-09 NOTE — Transfer of Care (Addendum)
Immediate Anesthesia Transfer of Care Note  Patient: Kayla Little  Procedure(s) Performed: RIGHT SALPINGECTOMY WITH REMOVAL OF ECTOPIC PREGNANCY (Right Abdomen)  Patient Location: PACU  Anesthesia Type:General  Level of Consciousness: awake, alert  and oriented  Airway & Oxygen Therapy: Patient Spontanous Breathing and Patient connected to nasal cannula oxygen  Post-op Assessment: Report given to RN and Post -op Vital signs reviewed and stable  Post vital signs: Reviewed and stable  Last Vitals:  Vitals Value Taken Time  BP    Temp    Pulse 94 07/09/2017  5:40 PM  Resp 23 07/09/2017  5:40 PM  SpO2 98 % 07/09/2017  5:40 PM  Vitals shown include unvalidated device data.  Last Pain:  Vitals:   07/09/17 1421  TempSrc: Oral         Complications: No apparent anesthesia complications

## 2017-07-10 ENCOUNTER — Encounter (HOSPITAL_COMMUNITY): Payer: Self-pay | Admitting: Obstetrics & Gynecology

## 2017-07-11 LAB — RH IG WORKUP (INCLUDES ABO/RH)
ABO/RH(D): O NEG
Antibody Screen: POSITIVE
Gestational Age(Wks): 4
Unit division: 0

## 2017-07-22 ENCOUNTER — Encounter: Payer: Self-pay | Admitting: Family Medicine

## 2017-07-22 DIAGNOSIS — O00101 Right tubal pregnancy without intrauterine pregnancy: Secondary | ICD-10-CM | POA: Diagnosis not present

## 2017-07-27 ENCOUNTER — Encounter: Payer: Self-pay | Admitting: Family Medicine

## 2017-12-19 DIAGNOSIS — O0911 Supervision of pregnancy with history of ectopic or molar pregnancy, first trimester: Secondary | ICD-10-CM | POA: Diagnosis not present

## 2017-12-19 DIAGNOSIS — Z3200 Encounter for pregnancy test, result unknown: Secondary | ICD-10-CM | POA: Diagnosis not present

## 2017-12-19 DIAGNOSIS — Z3A01 Less than 8 weeks gestation of pregnancy: Secondary | ICD-10-CM | POA: Diagnosis not present

## 2017-12-21 DIAGNOSIS — O0911 Supervision of pregnancy with history of ectopic or molar pregnancy, first trimester: Secondary | ICD-10-CM | POA: Diagnosis not present

## 2017-12-21 DIAGNOSIS — Z3A01 Less than 8 weeks gestation of pregnancy: Secondary | ICD-10-CM | POA: Diagnosis not present

## 2017-12-23 DIAGNOSIS — O0911 Supervision of pregnancy with history of ectopic or molar pregnancy, first trimester: Secondary | ICD-10-CM | POA: Diagnosis not present

## 2017-12-23 DIAGNOSIS — Z3A01 Less than 8 weeks gestation of pregnancy: Secondary | ICD-10-CM | POA: Diagnosis not present

## 2017-12-29 DIAGNOSIS — Z3A01 Less than 8 weeks gestation of pregnancy: Secondary | ICD-10-CM | POA: Diagnosis not present

## 2018-01-03 DIAGNOSIS — Z3A01 Less than 8 weeks gestation of pregnancy: Secondary | ICD-10-CM | POA: Diagnosis not present

## 2018-01-03 DIAGNOSIS — O0911 Supervision of pregnancy with history of ectopic or molar pregnancy, first trimester: Secondary | ICD-10-CM | POA: Diagnosis not present

## 2018-01-26 DIAGNOSIS — O09521 Supervision of elderly multigravida, first trimester: Secondary | ICD-10-CM | POA: Diagnosis not present

## 2018-01-26 LAB — OB RESULTS CONSOLE HIV ANTIBODY (ROUTINE TESTING): HIV: NONREACTIVE

## 2018-01-26 LAB — OB RESULTS CONSOLE RPR: RPR: NONREACTIVE

## 2018-01-26 LAB — OB RESULTS CONSOLE HEPATITIS B SURFACE ANTIGEN: Hepatitis B Surface Ag: NEGATIVE

## 2018-01-26 LAB — OB RESULTS CONSOLE RUBELLA ANTIBODY, IGM: Rubella: IMMUNE

## 2018-02-14 DIAGNOSIS — O09521 Supervision of elderly multigravida, first trimester: Secondary | ICD-10-CM | POA: Diagnosis not present

## 2018-02-14 DIAGNOSIS — Z3A12 12 weeks gestation of pregnancy: Secondary | ICD-10-CM | POA: Diagnosis not present

## 2018-02-14 LAB — OB RESULTS CONSOLE GC/CHLAMYDIA
Chlamydia: NEGATIVE
Gonorrhea: NEGATIVE

## 2018-03-08 NOTE — L&D Delivery Note (Signed)
Delivery Note At 6:24 PM a viable and healthy female was delivered via  (Presentation: ;  ).  APGAR: 9, 9; weight  pending.   Placenta status: spontaneous, intact.  Cord:  with the following complications:  x one reduced.  Cord pH: na  Anesthesia:  epidural Episiotomy:  na Lacerations:  Second Suture Repair: 3.0 vicryl rapide Est. Blood Loss (mL):  100  Mom to postpartum.  Baby to Couplet care / Skin to Skin.  Waqas Bruhl J 08/22/2018, 6:34 PM

## 2018-03-30 DIAGNOSIS — O09522 Supervision of elderly multigravida, second trimester: Secondary | ICD-10-CM | POA: Diagnosis not present

## 2018-03-30 DIAGNOSIS — Z3A18 18 weeks gestation of pregnancy: Secondary | ICD-10-CM | POA: Diagnosis not present

## 2018-07-26 LAB — OB RESULTS CONSOLE GBS: GBS: NEGATIVE

## 2018-08-11 ENCOUNTER — Telehealth (HOSPITAL_COMMUNITY): Payer: Self-pay | Admitting: *Deleted

## 2018-08-11 ENCOUNTER — Encounter (HOSPITAL_COMMUNITY): Payer: Self-pay | Admitting: *Deleted

## 2018-08-11 NOTE — Telephone Encounter (Signed)
Preadmission screen  

## 2018-08-18 ENCOUNTER — Other Ambulatory Visit: Payer: Self-pay

## 2018-08-18 ENCOUNTER — Other Ambulatory Visit (HOSPITAL_COMMUNITY)
Admission: RE | Admit: 2018-08-18 | Discharge: 2018-08-18 | Disposition: A | Discharge status: Home / Self Care | Payer: 59 | Source: Ambulatory Visit | Attending: Obstetrics and Gynecology | Admitting: Obstetrics and Gynecology

## 2018-08-18 DIAGNOSIS — Z01812 Encounter for preprocedural laboratory examination: Secondary | ICD-10-CM | POA: Insufficient documentation

## 2018-08-18 DIAGNOSIS — Z1159 Encounter for screening for other viral diseases: Secondary | ICD-10-CM | POA: Diagnosis not present

## 2018-08-18 NOTE — MAU Note (Signed)
Asymptomatic, swab collected without problem

## 2018-08-19 LAB — NOVEL CORONAVIRUS, NAA (HOSP ORDER, SEND-OUT TO REF LAB; TAT 18-24 HRS): SARS-CoV-2, NAA: NOT DETECTED

## 2018-08-21 ENCOUNTER — Other Ambulatory Visit: Payer: Self-pay | Admitting: Obstetrics and Gynecology

## 2018-08-21 ENCOUNTER — Other Ambulatory Visit (HOSPITAL_COMMUNITY): Payer: Self-pay | Admitting: *Deleted

## 2018-08-22 ENCOUNTER — Encounter (HOSPITAL_COMMUNITY): Payer: Self-pay | Admitting: Obstetrics

## 2018-08-22 ENCOUNTER — Inpatient Hospital Stay (HOSPITAL_COMMUNITY)
Admission: AD | Admit: 2018-08-22 | Discharge: 2018-08-23 | DRG: 806 | Disposition: A | Discharge status: Home / Self Care | Payer: 59 | Attending: Obstetrics and Gynecology | Admitting: Obstetrics and Gynecology

## 2018-08-22 ENCOUNTER — Inpatient Hospital Stay (HOSPITAL_COMMUNITY): Payer: 59

## 2018-08-22 ENCOUNTER — Inpatient Hospital Stay (HOSPITAL_COMMUNITY): Payer: 59 | Admitting: Anesthesiology

## 2018-08-22 ENCOUNTER — Other Ambulatory Visit: Payer: Self-pay

## 2018-08-22 DIAGNOSIS — Z3A39 39 weeks gestation of pregnancy: Secondary | ICD-10-CM | POA: Diagnosis not present

## 2018-08-22 DIAGNOSIS — O26893 Other specified pregnancy related conditions, third trimester: Secondary | ICD-10-CM | POA: Diagnosis present

## 2018-08-22 DIAGNOSIS — Z170 Estrogen receptor positive status [ER+]: Secondary | ICD-10-CM | POA: Diagnosis present

## 2018-08-22 DIAGNOSIS — F17210 Nicotine dependence, cigarettes, uncomplicated: Secondary | ICD-10-CM | POA: Diagnosis present

## 2018-08-22 DIAGNOSIS — O9081 Anemia of the puerperium: Secondary | ICD-10-CM | POA: Diagnosis not present

## 2018-08-22 DIAGNOSIS — Z6791 Unspecified blood type, Rh negative: Secondary | ICD-10-CM

## 2018-08-22 DIAGNOSIS — Z3490 Encounter for supervision of normal pregnancy, unspecified, unspecified trimester: Secondary | ICD-10-CM | POA: Diagnosis present

## 2018-08-22 DIAGNOSIS — D62 Acute posthemorrhagic anemia: Secondary | ICD-10-CM | POA: Diagnosis not present

## 2018-08-22 DIAGNOSIS — O26843 Uterine size-date discrepancy, third trimester: Secondary | ICD-10-CM | POA: Diagnosis present

## 2018-08-22 DIAGNOSIS — C50211 Malignant neoplasm of upper-inner quadrant of right female breast: Secondary | ICD-10-CM | POA: Diagnosis present

## 2018-08-22 DIAGNOSIS — O99334 Smoking (tobacco) complicating childbirth: Secondary | ICD-10-CM | POA: Diagnosis present

## 2018-08-22 LAB — CBC
HCT: 32.7 % — ABNORMAL LOW (ref 36.0–46.0)
Hemoglobin: 10.7 g/dL — ABNORMAL LOW (ref 12.0–15.0)
MCH: 31.7 pg (ref 26.0–34.0)
MCHC: 32.7 g/dL (ref 30.0–36.0)
MCV: 96.7 fL (ref 80.0–100.0)
Platelets: 275 10*3/uL (ref 150–400)
RBC: 3.38 MIL/uL — ABNORMAL LOW (ref 3.87–5.11)
RDW: 14.5 % (ref 11.5–15.5)
WBC: 13.2 10*3/uL — ABNORMAL HIGH (ref 4.0–10.5)
nRBC: 0 % (ref 0.0–0.2)

## 2018-08-22 MED ORDER — SIMETHICONE 80 MG PO CHEW: 80.0000 mg | CHEWABLE_TABLET | ORAL | Status: DC | PRN

## 2018-08-22 MED ORDER — METHYLERGONOVINE MALEATE 0.2 MG PO TABS: 0.2000 mg | ORAL_TABLET | ORAL | Status: DC | PRN

## 2018-08-22 MED ORDER — TETANUS-DIPHTH-ACELL PERTUSSIS 5-2.5-18.5 LF-MCG/0.5 IM SUSP: 0.5000 mL | Freq: Once | INTRAMUSCULAR | Status: DC

## 2018-08-22 MED ORDER — ONDANSETRON HCL 4 MG/2ML IJ SOLN: 4.0000 mg | Freq: Four times a day (QID) | INTRAMUSCULAR | Status: DC | PRN

## 2018-08-22 MED ORDER — COCONUT OIL OIL: 1.0000 "application " | TOPICAL_OIL | Status: DC | PRN

## 2018-08-22 MED ORDER — ONDANSETRON HCL 4 MG PO TABS: 4.0000 mg | ORAL_TABLET | ORAL | Status: DC | PRN

## 2018-08-22 MED ORDER — OXYCODONE-ACETAMINOPHEN 5-325 MG PO TABS: 2.0000 | ORAL_TABLET | ORAL | Status: DC | PRN

## 2018-08-22 MED ORDER — ACETAMINOPHEN 325 MG PO TABS: 650.0000 mg | ORAL_TABLET | ORAL | Status: DC | PRN

## 2018-08-22 MED ORDER — OXYTOCIN 40 UNITS IN NORMAL SALINE INFUSION - SIMPLE MED: 2.5000 [IU]/h | INTRAVENOUS | Status: DC

## 2018-08-22 MED ORDER — IBUPROFEN 600 MG PO TABS
600.0000 mg | ORAL_TABLET | Freq: Four times a day (QID) | ORAL | Status: DC
  Administered 2018-08-22 – 2018-08-23 (×4): 600 mg via ORAL
  Filled 2018-08-22 (×4): qty 1

## 2018-08-22 MED ORDER — SENNOSIDES-DOCUSATE SODIUM 8.6-50 MG PO TABS
2.0000 | ORAL_TABLET | ORAL | Status: DC
  Administered 2018-08-22: 2 via ORAL
  Filled 2018-08-22: qty 2

## 2018-08-22 MED ORDER — SOD CITRATE-CITRIC ACID 500-334 MG/5ML PO SOLN: 30.0000 mL | ORAL | Status: DC | PRN

## 2018-08-22 MED ORDER — FENTANYL-BUPIVACAINE-NACL 0.5-0.125-0.9 MG/250ML-% EP SOLN
12.0000 mL/h | EPIDURAL | Status: DC | PRN
  Filled 2018-08-22: qty 250

## 2018-08-22 MED ORDER — PHENYLEPHRINE 40 MCG/ML (10ML) SYRINGE FOR IV PUSH (FOR BLOOD PRESSURE SUPPORT): 80.0000 ug | PREFILLED_SYRINGE | INTRAVENOUS | Status: DC | PRN

## 2018-08-22 MED ORDER — SERTRALINE HCL 50 MG PO TABS
50.0000 mg | ORAL_TABLET | Freq: Every day | ORAL | Status: DC
  Administered 2018-08-22: 22:00:00 50 mg via ORAL
  Filled 2018-08-22: qty 1

## 2018-08-22 MED ORDER — BENZOCAINE-MENTHOL 20-0.5 % EX AERO
1.0000 "application " | INHALATION_SPRAY | CUTANEOUS | Status: DC | PRN
  Administered 2018-08-22: 1 via TOPICAL
  Filled 2018-08-22: qty 56

## 2018-08-22 MED ORDER — DIPHENHYDRAMINE HCL 50 MG/ML IJ SOLN: 12.5000 mg | INTRAMUSCULAR | Status: DC | PRN

## 2018-08-22 MED ORDER — OXYCODONE-ACETAMINOPHEN 5-325 MG PO TABS
1.0000 | ORAL_TABLET | ORAL | Status: DC | PRN
  Administered 2018-08-23 (×2): 1 via ORAL
  Filled 2018-08-22 (×3): qty 1

## 2018-08-22 MED ORDER — ONDANSETRON HCL 4 MG/2ML IJ SOLN: 4.0000 mg | INTRAMUSCULAR | Status: DC | PRN

## 2018-08-22 MED ORDER — METHYLERGONOVINE MALEATE 0.2 MG/ML IJ SOLN: 0.2000 mg | INTRAMUSCULAR | Status: DC | PRN

## 2018-08-22 MED ORDER — LACTATED RINGERS IV SOLN
500.0000 mL | INTRAVENOUS | Status: DC | PRN
  Administered 2018-08-22: 17:00:00 500 mL via INTRAVENOUS

## 2018-08-22 MED ORDER — WITCH HAZEL-GLYCERIN EX PADS: 1.0000 "application " | MEDICATED_PAD | CUTANEOUS | Status: DC | PRN

## 2018-08-22 MED ORDER — DIBUCAINE (PERIANAL) 1 % EX OINT: 1.0000 "application " | TOPICAL_OINTMENT | CUTANEOUS | Status: DC | PRN

## 2018-08-22 MED ORDER — SODIUM CHLORIDE (PF) 0.9 % IJ SOLN
INTRAMUSCULAR | Status: DC | PRN
  Administered 2018-08-22: 14 mL/h via EPIDURAL

## 2018-08-22 MED ORDER — OXYTOCIN 40 UNITS IN NORMAL SALINE INFUSION - SIMPLE MED
1.0000 m[IU]/min | INTRAVENOUS | Status: DC
  Administered 2018-08-22: 2 m[IU]/min via INTRAVENOUS
  Filled 2018-08-22: qty 1000

## 2018-08-22 MED ORDER — PRENATAL MULTIVITAMIN CH
1.0000 | ORAL_TABLET | Freq: Every day | ORAL | Status: DC
  Administered 2018-08-23: 12:00:00 1 via ORAL
  Filled 2018-08-22: qty 1

## 2018-08-22 MED ORDER — OXYTOCIN BOLUS FROM INFUSION
500.0000 mL | Freq: Once | INTRAVENOUS | Status: AC
  Administered 2018-08-22: 500 mL via INTRAVENOUS

## 2018-08-22 MED ORDER — LIDOCAINE HCL (PF) 1 % IJ SOLN: 30.0000 mL | INTRAMUSCULAR | Status: DC | PRN

## 2018-08-22 MED ORDER — EPHEDRINE 5 MG/ML INJ: 10.0000 mg | INTRAVENOUS | Status: DC | PRN

## 2018-08-22 MED ORDER — DIPHENHYDRAMINE HCL 25 MG PO CAPS: 25.0000 mg | ORAL_CAPSULE | Freq: Four times a day (QID) | ORAL | Status: DC | PRN

## 2018-08-22 MED ORDER — LIDOCAINE HCL (PF) 1 % IJ SOLN
INTRAMUSCULAR | Status: DC | PRN
  Administered 2018-08-22: 11 mL via EPIDURAL

## 2018-08-22 MED ORDER — ZOLPIDEM TARTRATE 5 MG PO TABS: 5.0000 mg | ORAL_TABLET | Freq: Every evening | ORAL | Status: DC | PRN

## 2018-08-22 MED ORDER — TERBUTALINE SULFATE 1 MG/ML IJ SOLN: 0.2500 mg | Freq: Once | INTRAMUSCULAR | Status: DC | PRN

## 2018-08-22 MED ORDER — LACTATED RINGERS IV SOLN
INTRAVENOUS | Status: DC
  Administered 2018-08-22 (×2): via INTRAVENOUS

## 2018-08-22 MED ORDER — LACTATED RINGERS IV SOLN
500.0000 mL | Freq: Once | INTRAVENOUS | Status: AC
  Administered 2018-08-22: 500 mL via INTRAVENOUS

## 2018-08-22 NOTE — H&P (Signed)
Kayla Little is a 33 y.o. female presenting for IOL at 39w. OB History    Gravida  3   Para  1   Term  1   Preterm      AB  1   Living  1     SAB      TAB      Ectopic  1   Multiple  0   Live Births  1          Past Medical History:  Diagnosis Date  . ADD (attention deficit disorder)   . Breast lump    R breast  . Depression   . Hx of varicella   . Kidney stone 2012  . Postpartum care following vaginal delivery (7/27) 10/02/2014   Past Surgical History:  Procedure Laterality Date  . NO PAST SURGERIES    . UNILATERAL SALPINGECTOMY Right 07/09/2017   Procedure: RIGHT SALPINGECTOMY WITH REMOVAL OF ECTOPIC PREGNANCY;  Surgeon: Azucena Fallen, MD;  Location: [facility];  Service: Gynecology;  Laterality: Right;   Family History: family history includes Alzheimer's disease in her paternal grandmother; Breast cancer in her maternal grandmother; Breast cancer (age of onset: 2) in her mother; Cancer in her maternal grandfather; Cancer (age of onset: 79) in her father; Cancer (age of onset: 58) in her mother; Diabetes in her maternal grandmother; Endometriosis in her maternal grandmother and mother. Social History:  reports that she has been smoking cigarettes. She has a 2.50 pack-year smoking history. She has never used smokeless tobacco. She reports current alcohol use of about 7.0 - 14.0 standard drinks of alcohol per week. She reports that she does not use drugs.     Maternal Diabetes: No Genetic Screening: Normal Maternal Ultrasounds/Referrals: Normal Fetal Ultrasounds or other Referrals:  None Maternal Substance Abuse:  No Significant Maternal Medications:  None Significant Maternal Lab Results:  None Other Comments:  None  Review of Systems  Constitutional: Negative.   All other systems reviewed and are negative.  Maternal Medical History:  Reason for admission: Contractions.   Contractions: Onset was less than 1 hour ago.   Perceived severity is mild.     Fetal activity: Perceived fetal activity is normal.   Last perceived fetal movement was within the past hour.    Prenatal complications: no prenatal complications Prenatal Complications - Diabetes: none.      Blood pressure 118/68, pulse (!) 113, temperature 98.2 F (36.8 C), temperature source Oral, resp. rate 18, unknown if currently breastfeeding. Maternal Exam:  Uterine Assessment: Contraction strength is mild.  Contraction frequency is rare.   Abdomen: Patient reports no abdominal tenderness. Fetal presentation: vertex  Introitus: Normal vulva. Normal vagina.  Ferning test: not done.  Nitrazine test: not done. Amniotic fluid character: not assessed.  Pelvis: adequate for delivery.   Cervix: Cervix evaluated by digital exam.     Physical Exam  Genitourinary:    Vulva normal.     Prenatal labs: ABO, Rh:   Antibody:   Rubella:   RPR:    HBsAg:    HIV:    GBS:     Assessment/Plan: 39 wk IUP Size dates discrepancy IOL   Kayla Little J 08/22/2018, 6:46 AM

## 2018-08-22 NOTE — Anesthesia Procedure Notes (Signed)
Epidural Patient location during procedure: OB Start time: 08/22/2018 12:01 PM End time: 08/22/2018 12:13 PM  Staffing Anesthesiologist: Lynda Rainwater, MD Performed: anesthesiologist   Preanesthetic Checklist Completed: patient identified, site marked, surgical consent, pre-op evaluation, timeout performed, IV checked, risks and benefits discussed and monitors and equipment checked  Epidural Patient position: sitting Prep: ChloraPrep Patient monitoring: heart rate, cardiac monitor, continuous pulse ox and blood pressure Approach: midline Location: L2-L3 Injection technique: LOR saline  Needle:  Needle type: Tuohy  Needle gauge: 17 G Needle length: 9 cm Needle insertion depth: 5 cm Catheter type: closed end flexible Catheter size: 20 Guage Catheter at skin depth: 9 cm Test dose: negative  Assessment Events: blood not aspirated, injection not painful, no injection resistance, negative IV test and no paresthesia  Additional Notes Reason for block:procedure for pain

## 2018-08-22 NOTE — Progress Notes (Signed)
Sent by Dr Ronita Hipps to [facility]  Subjective:   aware of ctx - not painful but tight planning epidural  Objective:   VS: BP 117/72   Pulse 84   Temp 98.2 F (36.8 C) (Oral)   Resp 16   Ht 5\' 6"  (1.676 m)   Wt 82.8 kg   BMI 29.47 kg/m  FHR : baseline 135 / variability moderate / accelerations  / no decelerations Toco: contractions every 2-3 minutes / pitocin @ 8 mu/min  cervix :posterior, loose 2cm / 60% vtx -2  membranes: AROM - clear  Assessment /Plan:  latent labor FHR category 1  Dr Ronita Hipps updated   Kayla Little CNM, MSN, [facility] 08/22/2018, 9:59 AM

## 2018-08-22 NOTE — Anesthesia Preprocedure Evaluation (Signed)
Anesthesia Evaluation  Patient identified by MRN, date of birth, ID band Patient awake    Reviewed: Allergy & Precautions, H&P , Patient's Chart, lab work & pertinent test results  Airway Mallampati: III  TM Distance: >3 FB Neck ROM: full    Dental no notable dental hx. (+) Teeth Intact   Pulmonary neg pulmonary ROS, Current Smoker,    Pulmonary exam normal breath sounds clear to auscultation       Cardiovascular negative cardio ROS Normal cardiovascular exam Rhythm:regular Rate:Normal     Neuro/Psych PSYCHIATRIC DISORDERS Hx/o ADDnegative neurological ROS  negative psych ROS   GI/Hepatic negative GI ROS, Neg liver ROS,   Endo/Other  negative endocrine ROS  Renal/GU Renal diseaseHx/o renal calculi  negative genitourinary   Musculoskeletal   Abdominal   Peds  Hematology negative hematology ROS (+)   Anesthesia Other Findings   Reproductive/Obstetrics (+) Pregnancy                             Anesthesia Physical  Anesthesia Plan  ASA: II  Anesthesia Plan: Epidural   Post-op Pain Management:    Induction:   PONV Risk Score and Plan:   Airway Management Planned:   Additional Equipment:   Intra-op Plan:   Post-operative Plan:   Informed Consent: I have reviewed the patients History and Physical, chart, labs and discussed the procedure including the risks, benefits and alternatives for the proposed anesthesia with the patient or authorized representative who has indicated his/her understanding and acceptance.       Plan Discussed with: Anesthesiologist  Anesthesia Plan Comments:         Anesthesia Quick Evaluation

## 2018-08-22 NOTE — Progress Notes (Signed)
Kayla Little is a 33 y.o. G3P1011 at [redacted]w[redacted]d by LMP admitted for induction of labor due to Elective at term.  Subjective: Comfortable with epidural occ pressure  Objective: BP (!) 94/46   Pulse 79   Temp (!) 97.5 F (36.4 C) (Axillary)   Resp 18   Ht 5\' 6"  (1.676 m)   Wt 82.8 kg   SpO2 100%   BMI 29.47 kg/m  No intake/output data recorded. Total I/O In: -  Out: 750 [Urine:750]  FHT:  120s, BTBV 5-25, accels noted. , variables to 90 with contractions UC:   regular, every 3 minutes SVE:   Dilation: 9 Effacement (%): 70 Station: 0, Plus 1 Exam by:: Terrill Alperin md  FSE placed due to inability to trace with external  Labs: Lab Results  Component Value Date   WBC 13.2 (H) 08/22/2018   HGB 10.7 (L) 08/22/2018   HCT 32.7 (L) 08/22/2018   MCV 96.7 08/22/2018   PLT 275 08/22/2018    Assessment / Plan: Induction of labor due to term with favorable cervix,  progressing well on pitocin  Labor: Progressing normally Preeclampsia:  no signs or symptoms of toxicity Fetal Wellbeing:  Category I- categpry 2 Pain Control:  Epidural I/D:  n/a Anticipated MOD:  NSVD  Xaivier Malay J 08/22/2018, 4:46 PM

## 2018-08-22 NOTE — Progress Notes (Signed)
Kayla Little is a 33 y.o. G3P1011 at [redacted]w[redacted]d by LMP admitted for induction of labor due to Elective at term.  Subjective: Comfortable with epidural  Objective: BP 109/72   Pulse 84   Temp 97.7 F (36.5 C) (Oral)   Resp 18   Ht 5\' 6"  (1.676 m)   Wt 82.8 kg   SpO2 100%   BMI 29.47 kg/m  No intake/output data recorded. Total I/O In: -  Out: 250 [Urine:250]  FHT:  FHR: 145 bpm, variability: moderate,  accelerations:  Present,  decelerations:  Absent UC:   regular, every 3 minutes SVE:   Dilation: 6 Effacement (%): 70 Station: -2, -1 Exam by:: Angeles Zehner md  IUPC placed without difficulty  Labs: Lab Results  Component Value Date   WBC 13.2 (H) 08/22/2018   HGB 10.7 (L) 08/22/2018   HCT 32.7 (L) 08/22/2018   MCV 96.7 08/22/2018   PLT 275 08/22/2018    Assessment / Plan: Induction of labor due to term with favorable cervix,  progressing well on pitocin  Labor: Progressing normally Preeclampsia:  no signs or symptoms of toxicity Fetal Wellbeing:  Category I Pain Control:  Epidural I/D:  n/a Anticipated MOD:  NSVD  Ubah Radke J 08/22/2018, 2:44 PM

## 2018-08-22 NOTE — Lactation Note (Signed)
This note was copied from a baby's chart. Lactation Consultation Note  Patient Name: Kayla Little JOINO'M Date: 08/22/2018 Reason for consult: Initial assessment;Term P2, 5 hour female infant. Per mom, infant latched in L&D and breastfeed for 12 minutes in room. Mom feels breastfeeding is going well, infant latched prior to [facility] entering the room. Mom is experienced at breastfeeding, she breastfeed her almost 35 year old son for 12 weeks she stopped due to returning to work .  Mom has DEBP Spectra at home. Mom knows to breastfeed according to hunger cues, 8 to 12 times within 24 hours on demand. Mom knows about cluster feeding after 24 hours of life. LC discussed I & O. Parents will continue to do as much STS as possible and mom was doing STS when [facility] left room. Mom made aware of O/P services, breastfeeding support groups, community resources, and our phone # for post-discharge questions.   Maternal Data Formula Feeding for Exclusion: No Has patient been taught Hand Expression?: Yes  Feeding Feeding Type: Breast Fed  LATCH Score                   Interventions Interventions: Breast feeding basics reviewed;Skin to skin;Breast massage;Hand express  Lactation Tools Discussed/Used WIC Program: No   Consult Status Consult Status: Follow-up Date: 08/22/18 Follow-up type: In-patient    Vicente Serene 08/22/2018, 11:48 PM

## 2018-08-23 LAB — CBC
HCT: 32.1 % — ABNORMAL LOW (ref 36.0–46.0)
Hemoglobin: 10.6 g/dL — ABNORMAL LOW (ref 12.0–15.0)
MCH: 31.5 pg (ref 26.0–34.0)
MCHC: 33 g/dL (ref 30.0–36.0)
MCV: 95.5 fL (ref 80.0–100.0)
Platelets: 259 10*3/uL (ref 150–400)
RBC: 3.36 MIL/uL — ABNORMAL LOW (ref 3.87–5.11)
RDW: 14.2 % (ref 11.5–15.5)
WBC: 18.5 10*3/uL — ABNORMAL HIGH (ref 4.0–10.5)
nRBC: 0 % (ref 0.0–0.2)

## 2018-08-23 MED ORDER — POLYSACCHARIDE IRON COMPLEX 150 MG PO CAPS
150.0000 mg | ORAL_CAPSULE | Freq: Every day | ORAL | Status: DC
  Administered 2018-08-23: 12:00:00 150 mg via ORAL
  Filled 2018-08-23: qty 1

## 2018-08-23 MED ORDER — ACETAMINOPHEN 325 MG PO TABS: 650.0000 mg | ORAL_TABLET | ORAL | 0 refills | Status: AC | PRN

## 2018-08-23 MED ORDER — MAGNESIUM OXIDE 400 (241.3 MG) MG PO TABS
400.0000 mg | ORAL_TABLET | Freq: Every day | ORAL | Status: DC
  Administered 2018-08-23: 400 mg via ORAL
  Filled 2018-08-23: qty 1

## 2018-08-23 MED ORDER — RHO D IMMUNE GLOBULIN 1500 UNIT/2ML IJ SOSY
300.0000 ug | PREFILLED_SYRINGE | Freq: Once | INTRAMUSCULAR | Status: AC
  Administered 2018-08-23: 11:00:00 300 ug via INTRAVENOUS
  Filled 2018-08-23: qty 2

## 2018-08-23 MED ORDER — IBUPROFEN 600 MG PO TABS: 600.0000 mg | ORAL_TABLET | Freq: Four times a day (QID) | ORAL | 0 refills | Status: DC

## 2018-08-23 NOTE — Lactation Note (Signed)
This note was copied from a baby's chart. Lactation Consultation Note  Patient Name: Kayla Little Today's Date: 08/23/2018   Baby 51 hours old and have requested an early discharge. Provided education regarding baby being spitty. Encouraged STS and breastfeeding on demand 8-12 times per day. Reviewed engorgement care and monitoring voids/stools.      Maternal Data    Feeding Feeding Type: Breast Fed  LATCH Score                   Interventions    Lactation Tools Discussed/Used     Consult Status      Carlye Grippe 08/23/2018, 12:08 PM

## 2018-08-23 NOTE — Anesthesia Postprocedure Evaluation (Signed)
Anesthesia Post Note  Patient: Kayla Little  Procedure(s) Performed: AN AD [place]     Patient location during evaluation: Mother Baby Anesthesia Type: Epidural Level of consciousness: awake and alert Pain management: pain level controlled Vital Signs Assessment: post-procedure vital signs reviewed and stable Respiratory status: spontaneous breathing Cardiovascular status: stable Postop Assessment: able to ambulate and no headache Anesthetic complications: no Comments: Phone postop    Last Vitals:  Vitals:   08/23/18 0553 08/23/18 0820  BP: 108/77 111/76  Pulse: 78 76  Resp: 16 18  Temp: 36.6 C (!) 36.3 C  SpO2:      Last Pain:  Vitals:   08/23/18 0820  TempSrc: Oral  PainSc:    Pain Goal:                   Everette Rank

## 2018-08-23 NOTE — Progress Notes (Signed)
PPD #1, SVD, 2nd degree perineal, baby girl "Rinaldo Cloud"  S:  Reports feeling good, having some soreness and cramping, but improved today. Desires d/c home at 24 hours             Tolerating po/ No nausea or vomiting / Denies dizziness or SOB             Bleeding is moderate             Pain controlled with Motrin and Tylenol; took Percocet last night, but does not feel like she needs anymore             Up ad lib / ambulatory / voiding QS  Newborn breast feeding - latching well; producing colostrum   O:               VS: BP 111/76 (BP Location: Left Arm)   Pulse 76   Temp (!) 97.4 F (36.3 C) (Oral)   Resp 18   Ht 5\' 6"  (1.676 m)   Wt 82.8 kg   SpO2 98%   Breastfeeding Unknown   BMI 29.47 kg/m    LABS:              Recent Labs    08/22/18 0730 08/23/18 0502  WBC 13.2* 18.5*  HGB 10.7* 10.6*  PLT 275 259               Blood type: --/--/O NEG Performed at [facility], [address] [place], [place] [address]  [phone])  Rubella: Immune (11/21 0000)                     I&O: Intake/Output      06/16 0701 - 06/17 0700 06/17 0701 - 06/18 0700   I.V. (mL/kg) 0 (0)    Other 0    Total Intake(mL/kg) 0 (0)    Urine (mL/kg/hr) 825 (0.4)    Blood 100    Total Output 925    Net -925         Urine Occurrence 1 x                  Physical Exam:             Alert and oriented X3  Lungs: Clear and unlabored  Heart: regular rate and rhythm / no murmurs  Abdomen: soft, non-tender, non-distended              Fundus: firm, non-tender, U-E  Perineum: well approximated 2nd degree; mild edema, no erythema, no ecchymosis; ice pack in place  Lochia: small amount on pad  Extremities: no edema, no calf pain or tenderness    A: PPD # 1, SVD  2nd degree perineal repair   ABL anemia compounding chronic IDA  RH Negative/ Baby RH Positive   Doing well - stable status  P: Routine post partum orders  Niferex 150mg  PO daily; Magnesium oxide 400mg  PO daily  Continue home  iron daily x 6 weeks after discharge  Rhogam today, then d/c IV  Discharge home  WOB discharge book given, instructions and warning s/s reviewed  F/u with Dr. Ronita Hipps in 6 weeks for [facility] visit   Lars Pinks, MSN, CNM [facility]

## 2018-08-23 NOTE — Discharge Summary (Signed)
Obstetric Discharge Summary   Patient Name: Kayla Little DOB: 1985/05/28 MRN: [id number]  Date of Admission: 08/22/2018 Date of Discharge: 08/23/2018 Date of Delivery: 08/22/2018 Gestational Age at Delivery: [redacted]w[redacted]d  Primary OB: [facility] - Dr. Ronita Little   Antepartum complications:  - Hx. Of ectopic s/p right salpingectomy - Hx. Of depression : on Zoloft and followed by psychiatrist - RH Negative  - Maternal IDA - Tobacco use  Prenatal Labs:  ABO, Rh:  O Negative Antibody:  Positive (passively acquired Anti-D) Rubella:  Immune RPR:   NR HBsAg:   Negative HIV:   NR GBS negative Admitting Diagnosis: IOL at 39+1 weeks   Secondary Diagnoses: Patient Active Problem List   Diagnosis Date Noted  . SVD (spontaneous vaginal delivery) 08/23/2018  . Postpartum care following vaginal delivery (6/16) 08/23/2018  . Second degree perineal laceration 08/23/2018  . Encounter for planned induction of labor 08/22/2018  . Type O blood, Rh negative 10/08/2014    Augmentation: AROM, Pitocin  Complications: None  Date of Delivery: 08/22/2018 Delivered By: Dr. Ronita Little  Delivery Type: spontaneous vaginal delivery Anesthesia: epidural Placenta: spontaneous Laceration: 2nd degree perineal  Episiotomy: none  Newborn Data: Live born female  Birth Weight: 7 lb 1.4 oz (3215 g) APGAR: 9, 9  Newborn Delivery   Birth date/time: 08/22/2018 18:24:00 Delivery type: Vaginal, Spontaneous      Hospital/Postpartum Course  (Vaginal Delivery): Pt. Admitted for induction of labor at 39 weeks.  She progressed with Pitocin and AROM. See notes and delivery summary for details. Patient had an uncomplicated postpartum course.  By time of discharge on PPD#1, her pain was controlled on oral pain medications; she had appropriate lochia and was ambulating, voiding without difficulty and tolerating regular diet.  She was deemed stable for discharge to home.     Labs: CBC Latest Ref Rng & Units 08/23/2018  08/22/2018 07/09/2017  WBC 4.0 - 10.5 K/uL 18.5(H) 13.2(H) 12.2(H)  Hemoglobin 12.0 - 15.0 g/dL 10.6(L) 10.7(L) 12.2  Hematocrit 36.0 - 46.0 % 32.1(L) 32.7(L) 36.4  Platelets 150 - 400 K/uL 259 275 310   O NEG  Physical exam:  BP 111/76 (BP Location: Left Arm)   Pulse 76   Temp (!) 97.4 F (36.3 C) (Oral)   Resp 18   Ht 5\' 6"  (1.676 m)   Wt 82.8 kg   SpO2 98%   Breastfeeding Unknown   BMI 29.47 kg/m  General: alert and no distress Pulm: normal respiratory effort Lochia: appropriate Abdomen: soft, NT Uterine Fundus: firm, below umbilicus Perineum: healing well, no significant erythema, no significant edema Extremities: No evidence of DVT seen on physical exam. No lower extremity edema.   Disposition: stable, discharge to home Baby Feeding: breast milk Baby Disposition: home with mom  Contraception: IUD  Rh Immune globulin given: given 08/23/2018 Rubella vaccine given: N/A Tdap vaccine given in AP or PP setting: UTD Flu vaccine given in AP or PP setting: UTD   Plan:  Kayla Little was discharged to home in good condition. Follow-up appointment at [facility] in 6 weeks.  Discharge Instructions: Per After Visit Summary. Refer to After Visit Summary and [facility] discharge booklet  Activity: Advance as tolerated. Pelvic rest for 6 weeks.   Diet: Regular, Heart Healthy Discharge Medications: Allergies as of 08/23/2018   No Known Allergies     Medication List    TAKE these medications   acetaminophen 325 MG tablet Commonly known as: Tylenol Take 2 tablets (650 mg total) by mouth  every 4 (four) hours as needed (for pain scale < 4).   ibuprofen 600 MG tablet Commonly known as: ADVIL Take 1 tablet (600 mg total) by mouth every 6 (six) hours.   sertraline 50 MG tablet Commonly known as: ZOLOFT Take 50 mg by mouth at bedtime.            Discharge Care Instructions  (From admission, onward)         Start     Ordered   08/23/18 0000  Discharge  wound care:    Comments: Alternate between ice packs and warm water sitz baths as needed   08/23/18 1023         Outpatient follow up:  Follow-up Information    Kayla Few, MD. Schedule an appointment as soon as possible for a visit in 6 week(s).   Specialty: Obstetrics and Gynecology Why: Postpartum visit Contact information: [place] [address] [phone]           Signed:  Lars Pinks, MSN, CNM [facility]

## 2018-08-24 LAB — RH IG WORKUP (INCLUDES ABO/RH)
ABO/RH(D): O NEG
Fetal Screen: NEGATIVE
Gestational Age(Wks): 39.1
Unit division: 0

## 2018-08-24 LAB — RPR: RPR Ser Ql: NONREACTIVE

## 2018-08-25 LAB — BPAM RBC
Blood Product Expiration Date: 202007062359
Blood Product Expiration Date: 202007092359
Unit Type and Rh: 9500
Unit Type and Rh: 9500

## 2018-08-25 LAB — TYPE AND SCREEN
ABO/RH(D): O NEG
Antibody Screen: POSITIVE
Unit division: 0
Unit division: 0

## 2018-11-16 ENCOUNTER — Telehealth: Payer: Self-pay | Admitting: Licensed Clinical Social Worker

## 2018-11-16 NOTE — Telephone Encounter (Signed)
Received a genetic counseling referral from Dr. Ronita Hipps for genetic susceptible  malignant neoplasm of breast. Kayla Little has been cld and scheduled to see Kayla Little on 9/17 at 11am. She preferred a webex visit. I informed Kayla Little that someone will call her from our office to setup webex.

## 2018-11-22 ENCOUNTER — Telehealth: Payer: Self-pay | Admitting: Licensed Clinical Social Worker

## 2018-11-22 NOTE — Telephone Encounter (Signed)
Called patient regarding upcoming Webex appointment, patient is notified and e-mail has been sent. °

## 2018-11-23 ENCOUNTER — Inpatient Hospital Stay: Payer: 59 | Attending: Genetic Counselor | Admitting: Licensed Clinical Social Worker

## 2018-11-23 ENCOUNTER — Encounter: Payer: Self-pay | Admitting: Licensed Clinical Social Worker

## 2018-11-23 DIAGNOSIS — Z8042 Family history of malignant neoplasm of prostate: Secondary | ICD-10-CM

## 2018-11-23 DIAGNOSIS — Z807 Family history of other malignant neoplasms of lymphoid, hematopoietic and related tissues: Secondary | ICD-10-CM

## 2018-11-23 DIAGNOSIS — Z1371 Encounter for nonprocreative screening for genetic disease carrier status: Secondary | ICD-10-CM

## 2018-11-23 DIAGNOSIS — Z1501 Genetic susceptibility to malignant neoplasm of breast: Secondary | ICD-10-CM

## 2018-11-23 DIAGNOSIS — Z803 Family history of malignant neoplasm of breast: Secondary | ICD-10-CM | POA: Diagnosis not present

## 2018-11-23 DIAGNOSIS — Z148 Genetic carrier of other disease: Secondary | ICD-10-CM

## 2018-11-23 DIAGNOSIS — F17210 Nicotine dependence, cigarettes, uncomplicated: Secondary | ICD-10-CM

## 2018-11-23 DIAGNOSIS — Z1509 Genetic susceptibility to other malignant neoplasm: Secondary | ICD-10-CM

## 2018-11-23 DIAGNOSIS — Z1589 Genetic susceptibility to other disease: Secondary | ICD-10-CM

## 2018-11-23 DIAGNOSIS — Z8041 Family history of malignant neoplasm of ovary: Secondary | ICD-10-CM | POA: Diagnosis not present

## 2018-11-23 DIAGNOSIS — Z1502 Genetic susceptibility to malignant neoplasm of ovary: Secondary | ICD-10-CM

## 2018-11-23 NOTE — Progress Notes (Signed)
REFERRING PROVIDER: Brien Few, MD Kayla Little,  Kayla Little [id number]  PRIMARY PROVIDER:  Michel Harrow, PA-C  PRIMARY REASON FOR VISIT:  1. Family history of breast cancer   2. Family history of prostate cancer   3. Family history of lymphoma   4. Family history of ovarian cancer   5. Monoallelic mutation of CHEK2 gene in female patient   6. 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    Patient location: home Provider location: office  HISTORY OF PRESENT ILLNESS:   Kayla Little, a 33 y.o. female, was seen for a [facility] cancer genetics consultation at the request of Dr. 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  Kayla Little presents to clinic today to discuss the possibility of a hereditary predisposition to cancer, genetic testing, and to further clarify her future cancer risks, as well as potential cancer risks for family members.    Kayla Little is a 33 y.o. female with no personal history of cancer.    CANCER HISTORY:  Oncology History   No history exists.    RISK FACTORS:  Menarche was at age 40.  First live birth at age 64.  OCP use for approximately 4 years.  Ovaries intact: yes.  Hysterectomy: no.  Menopausal status: premenopausal.  HRT use: 0 years. Colonoscopy: no; not examined. Mammogram within the last year: no; she does report a breast ultrasound during her first pregnancy due to lumps in her breast, she said these were determined to be cysts and that they disappeared Number of breast biopsies: 0. Up to date with pelvic exams: yes. 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)    Hx of varicella    Kidney stone 2683   Monoallelic mutation of CHEK2 gene in female patient    Postpartum care following vaginal delivery (7/27) 10/02/2014    Past Surgical History:  Procedure Laterality Date   NO PAST SURGERIES     UNILATERAL SALPINGECTOMY Right 07/09/2017   Procedure: RIGHT SALPINGECTOMY WITH REMOVAL OF ECTOPIC PREGNANCY;  Surgeon: Azucena Fallen, MD;  Location: [facility];  Service: Gynecology;  Laterality: Right;    Social History   Socioeconomic History   Marital status: Married    Spouse name: Not on file   Number of children: Not on file   Years of education: Not on file   Highest education level: Not on file  Occupational History   Occupation: homemaker  Social Needs   Financial resource strain: Not hard at all   Food insecurity    Worry: Never true    Inability: Never true   Transportation needs    Medical: No    Non-medical: Not on file  Tobacco Use   Smoking status: Current Some Day Smoker    Packs/day: 0.50  Years: 5.00    Pack years: 2.50    Types: Cigarettes   Smokeless tobacco: Never Used  Substance and Sexual Activity   Alcohol use: Yes    Alcohol/week: 7.0 - 14.0 standard drinks    Types: 7 - 14 Glasses of wine per week   Drug use: Yes    Types: Marijuana    Comment: not during pregnancy   Sexual activity: Yes    Birth control/protection: None  Lifestyle   Physical activity    Days per week: Not on file    Minutes per session: Not on file   Stress: To some extent  Relationships   Social connections    Talks on phone: Not on file    Gets together: Not on file    Attends religious service: Not on file    Active member of club or organization: Not on file    Attends  meetings of clubs or organizations: Not on file    Relationship status: Not on file  Other Topics Concern   Not on file  Social History Narrative   Not on file     FAMILY HISTORY:  We obtained a detailed, 4-generation family history.  Significant diagnoses are listed below: Family History  Problem Relation Age of Onset   Breast cancer Mother 1       2 types of cancer one side mastectomy with chemo   Endometriosis Mother    Endometriosis Maternal Grandmother    Breast cancer Maternal Grandmother        post menopausal   Diabetes Maternal Grandmother    Cancer Maternal Grandfather        prostate and lymphoma   Alzheimer's disease Paternal Grandmother    Cancer Father 43       prostate cancer   Aneurysm Paternal Grandfather        d. 23s   Kayla Little has a son, age 42, and a daughter, 97 months old. Neither have had cancers. Patient has 2 full brothers, ages 72 and 18, no cancers. She is unsure of her brothers have doctors, and says they report they do not want genetic testing. Patient also has 1 half brother through her father, he is 34 and healthy.  Kayla Little mother was diagnosed with breast cancer at 30, triple negative, and had a recurrence recently at 55. Patient reports her mother is Dr. Virgie Dad patient and she is currently going through treatment. Patient states Dr. Jana Hakim had suggested she ask for genetic testing given her maternal family history, and then she had this done through Dr. Kennith Maes office. The patient has 1 maternal aunt, living at 50, no cancers. No cancers in maternal cousins. Her maternal grandmother had breast cancer at 72, was diagnosed at the same time as her mother. Her grandmother is living at 62. Patient's maternal grandfather had prostate cancer in his 59s and lymphoma at 28, he died at 66. Patient is not aware of any great aunts/uncles on her maternal side with cancer. One great aunt through her mother does have a daughter who was recently  diagnosed with ovarian cancer.  No known family history of thyroid cancer, pheochromocytomas, or hyperparathyroidism that she is aware of.   Kayla Little father was diagnosed with prostate cancer at 66, he is living at 56. She reports he has high blood pressure. He is an only child. Patient's maternal grandmother died later in life due to Alzheimer's. Maternal grandfather passed from a brain aneurysm, unsure of exact age but patient  knows he was young, in his 20s or 83s. No known family history of thyroid cancer, pheochromocytomas, or hyperparathyroidism that she has heard of.   Kayla Little is unaware of previous family history of genetic testing for hereditary cancer risks. Patient's maternal ancestors are of [place] and [place] descent, and paternal ancestors are of [place] descent. There is no reported Ashkenazi Jewish ancestry. There is no known consanguinity.  GENETIC COUNSELING ASSESSMENT: Ms. Zilberman is a 33 y.o. female who has a pathogenic variant in RET and likely pathogenic variant in [place]. We, therefore, discussed and recommended the following at today's visit.   DISCUSSION: We discussed the basics of genes and described the genetic testing that she had. The patient had [facility] Multi-Cancer Panel done through Dr. Kennith Maes office. The test reported out on 10/27/2018. This testing revealed a pathogenic variant in RET called c.1597G>T (p.Gly533cys), and a likely pathogenic variant in CHEK2 c.349A>G (p.Arg117Gly).   CHEK2  Clinical condition  The CHEK2 gene is associated with an increased risk for autosomal dominant adult-onset cancers, including breast, colon, thyroid, prostate, and possibly others. The risks of these cancers, particularly breast, have been determined to be both variant- and family history-dependent. We discussed that this result likely explains the colon cancer in the family.   Lifetime risks for female breast cancer related to frameshift variants, such as 1100delC, have been  estimated to be 25-39% in heterozygotes.The risks for most missense variants are unclear, but risks for certain variants (such as p.Ile157Thr) are thought to be lower.  Inheritance  Hereditary predisposition to cancer due to pathogenic variants in the CHEK2 gene has autosomal dominant inheritance. This means that an individual with a pathogenic variant has a 50% chance of passing the condition onto their offspring. This result allows for the identification of at-risk relatives who can pursue testing for this specific familial variant. Many cases are inherited from a parent, but some cases can occur spontaneously (i.e., an individual with a pathogenic variant has parents who do not have it)  Management  Current screening guidelines from the [employer]  (NCCN) for those with a pathogenic CHEK2 variant are as follows:  Breast cancer: -Annual mammogram with consideration of tomosynthesis; also consider breast MRI with contrast beginning at age 33 -Evidence of risk-reducing mastectomy is insufficient; manage based on family history  Ms. Heenan would like a referral to our high risk breast clinic to discuss this management further. She would prefer to be seen by Dr. Jana Hakim if possible, since this is her mother's doctor.   Colon cancer: -Colonoscopy screening every 5 years beginning at age 23 -If an individual has a first-degree relative with colorectal cancer, screening should begin 10 years prior to the relatives age at diagnosis if before 63. -If an individual has a personal history of colorectal cancer, screening recommendations should be based on recommendations for post-colorectal cancer resection.  It has been suggested that men with a CHEK2 pathogenic variant and a first-degree relative with prostate cancer have an annual prostate-specific antigen (PSA) analysis (PMID: 13244010). However, the benefits of screening for prostate cancer among men with a  pathogenic variant in CHEK2 are uncertain (PMID: 27253664).  Thyroid cancer: -Risk thought to be increased, however no management guidelines in place  RET (Multiple Enodcrine Neoplasia Type 2, MEN 2)  Clinical condition  Multiple endocrine neoplasia type 2 (MEN2) is a tumor-predisposition syndrome that results from pathogenic variants in the RET gene. MEN2 is divided into three subtypes: type 2A (MEN2A), type 2B (MEN2B), and familial  medullary thyroid carcinoma (FMTC). The characteristic signs, symptoms, and risks of specific tumors differ between these subtypes. The most common sign of MEN2 is medullary thyroid carcinoma (MTC). An estimated 25% of MTC cases are the result of germline mutations in the RET gene. Some affected individuals can also develop a pheochromocytoma, which is an adrenal gland tumor that can cause dangerously high blood pressure.  Kayla Little particular variant, RET c.1597G>T [id number]) is a well-known variant that has been reported to segregate with MEN2A.   MEN2A makes up approximately 70-80% of MEN2 cases and is clinically diagnosed by the occurrence of two or more specific endocrine tumors (medullary thyroid carcinoma, pheochromocytoma, parathyroid adenoma, or parathyroid hyperplasia) in a single individual (PMID: 94496759). Typically, MTC presents prior to age 22 (PMID: 16384665, 99357017, 79390300). Pheochromocytomas usually present either after [place] or concomitantly (PMID: 92330076). Pheochromocytomas in individuals with MEN2A are typically diagnosed at an earlier age, have more subtle symptoms, and are more likely to be bilateral than sporadic tumors (PMID: 22633354). Malignant transformation occurs in approximately 4% of cases (PMID: 5625638). Hyperparathyroidism, another symptom of MEN2A, is typically mild and may cause no symptoms; however, hypercalciuria and renal calculi may occur. The average age of onset is 85 (PMID: 93734287, 68115726).  Hirschsprungs disease,  a congenital condition characterized by aganglionosis of the gut, is also associated with pathogenic variants in RET. Up to 50% of those with familial Hirschsprungs disease and 15-20% of simplex cases have a germline mutation in RET. Approximately 7% of individuals with MEN2A also have Hirschsprungs disease. Loss of function mutations throughout the RET gene may cause Hirschsprungs disease, while pathogenic variants in exon 10 of RET are associated with both MEN2A and Hirschsprungs disease (PMID: 20355974, 16384536).  A summary of MEN2 subtypes and the associated risks are in the table below (PMID: 46803212):      Gene information  RET is a proto-oncogene and member of the cadherin superfamily. This gene plays a crucial role in neural crest development (NCBI Gene. Gene ID: [id number]. Accessed June 19, 2014). If there is a pathogenic variant in this gene that prevents it from functioning normally, the risk of developing certain types of cancers is increased.  Inheritance  Multiple endocrine neoplasia type 2 (including MEN2A, MEN2B, and FMTC) has autosomal dominant inheritance. This means that an individual with a pathogenic variant in the RET gene has a 50% chance of passing the condition on to their offspring. With this result, it is now possible to identify at-risk relatives who can pursue testing for this specific familial variant. The rate of spontaneous de novo mutations for MEN2A is approximately 5%; for MEN2B, it is approximately 50% (PMID: 50037048). Therefore, it is likely that Kayla Little inherited her RET variant from either her mother or father. It is hard to say which side of the family this is likely coming from since there are no reported MEN2-related cancers in her family. It is recommended both her mother and father have testing, she reports they will both likely be agreeable to this.   Management  The following recommendations are suggested for establishing the extent of disease in  and the needs of an individual diagnosed with MEN2 (PMID: 88916945, 03888280, 03491791, 50569794, [place]. Guidelines for the treatment of thyroid carcinoma. Version 2.2020):  -Referral to an endocrinologist -a consultation with a medical geneticist and/or genetic counselor -biochemical evaluations (plasma calcitonin, plasma catecholamines and metanephrines, serum calcium and parathyroid hormone) -a workup to evaluate for metastatic disease in individuals with MTC (  CT with contrast for chest and abdomen, MRI of liver with nodal disease or calcitonin >400 pg/mL) -Prophylactic thyroidectomy: Prophylactic thyroidectomy is recommended for all individuals with MEN2; however, the timing of surgery varies and is suggested based on the underlying RET pathogenic present in an individual. The American Thyroid Association (ATA) has proposed guidelines for the management of MTC and pheochromocytoma screening based on the specific variant present in an individual with MEN2 (PMID: 02774128). These guidelines are summarized below:  ATA-Highest Risk: Those with MEN2B and the M918T variant are at a significantly elevated risk of developing medullary thyroid cancer in the first Little years of life, so in these cases, total thyroidectomy is recommended within the first year or even the first Little months of life. Level VI lymph node dissection is dependent on the ability to identify, preserve, or transplant the parathyroid glands. Physical exam, cervical ultrasound, serum calcitonin, and carcinoembryonic antigen are recommended every 6 months for the first postoperative year, then annually thereafter (PMID: 78676720).  ATA-High Risk: Those with the A883F variant and pathogenic variants within codon 634 of the RET variant are at a high risk of developing medullary thyroid cancer. In these cases, annual physical examination, cervical ultrasound, and measurement of serum calcitonin levels should begin  at 35 years of age. Total thyroidectomy is recommended at or before 35 years of age, depending upon serum calcitonin results (PMID: 94709628).  ATA-Moderate Risk: Those with the following pathogenic variants in RET are at moderate risk of developing medullary thyroid cancer: G533C, C609F/G/R/S/Y, C611F/G/S/Y/W, C618F/R/S, C620F/R/S, C630R/Y, D631Y, K666E, E768D, L790F, V804L, V804M, S891A, and R912P. In these cases, total thyroidectomy is recommended when serum calcitonin levels become elevated, or in childhood if parents do not wish to embark on a lengthy period of surveillance (PMID: 36629476).  Kayla Little falls into the ATA-Moderate Risk category given her particular variant.   For all individuals with a pathogenic RET variant who have not had a thyroidectomy, annual biochemical screening is recommended, with immediate thyroidectomy if results are abnormal.  Pheochromocytoma screening: Prior to any surgery, the presence of a functioning pheochromocytoma should be excluded by appropriate biochemical screening in any individual with MEN2A or MEN2B. In a prospective study of at-risk family members with a RET mutation, 8% had pheochromocytoma detected at the same time as MTC. For individuals whose initial screening results are negative for pheochromocytoma, annual biochemical screening is recommended, followed by MRI or CT if the biochemical results are abnormal.  Screening for pheochromocytoma is recommended to begin at age 21 for those in the ATA-Highest Risk and High risk categories and at age 29 for those in the ATA-Moderate risk category (PMID: 54650354). Women with MEN2 should be screened for pheochromocytoma prior to a planned pregnancy--or as early as possible during an unplanned pregnancy. Dopamine D2 receptor antagonists (e.g. metoclopramide and veralipride) and ?-blockers have a high potential to cause an adverse reaction in individuals with pheochromocytoma and should therefore be avoided (PMID:  65681275, 17001749, 44967591, 63846659)  Parathyroid Surveillance: Surveillance for parathyroid adenoma or hyperplasia, including annual biochemical screening, is recommended for affected individuals who have not had parathyroidectomy and parathyroid autotransplantation. Parathyroid adenoma or hyperplasia diagnosed at the time of thyroidectomy is treated with either resection, subtotal parathyroidectomy, or total parathyroidectomy. In most individuals with MEN2A, hyperparathyroidism may be diagnosed many years after thyroidectomy (PMID: 93570177, 93903009, 23300762, 26333545).  An individuals cancer risk and medical management are not determined by genetic test results alone. Overall cancer risk assessment incorporates additional factors, including personal medical  history, family history, and any available genetic information that may result in a personalized plan for cancer prevention and surveillance.  FAMILY MEMBERS: It is important that all of Kayla Little's relatives (both men and women) know of the presence of this gene mutation. Site-specific genetic testing can sort out who in the family is at risk and who is not.   Kayla Little children and siblings have a 50% chance to have inherited this mutation. Given the recommendation for possible thyroidectomy in childhood, we recommend her children have genetic testing for the RET mutation, as identifying the presence of this mutation would allow them to also take advantage of risk-reducing measures. Her children will not need testing for the CHEK2 mutation until they are older. Kayla Little mentioned that her brothers are unlikely to agree to genetic testing. We discussed that they could be followed as though they are positive if they do not want to pursue genetic testing.   PLAN:   1. Kayla Little will be referred to Dr. Buddy Duty for an endocrinology work up.   2. Ms. Edgin will also be referred to our high risk breast clinic, she prefers to see Dr. Jana Hakim if  possible.   3. Ms. Bobst plans to discuss these results with her family. Her father lives in [place] and she feels he will be agreeable to testing. Her mother is seen here at the [facility]. I gave Ms. Men my phone number and let her know that if any of her family members want testing we can help coordinate this. We also planned to talk again when I have more information about how to go about testing for her children.    SUPPORT AND RESOURCES: If Ms. Lahmann is interested in MEN2-related support, we recommended American Multiple Endocrine Neoplasia (AMEN) support group. Additionally, there are many Facebook support groups for MEN2 that patients have found helpful. For CHEK2-related support, we recommended [facility] and Facing Our Risk for Cancer Empowered (FORCE).   We encouraged Ms. Barwick to remain in contact with [place] on an annual basis so we can update her personal and family histories, and let her know of advances in cancer genetics that may benefit the family. Our contact number was provided. Ms. Gressman questions were answered to her satisfaction today, and she knows she is welcome to call anytime with additional questions.    Faith Rogue, MS, [facility] Genetic Counselor [email or web address] Phone: [phone]  The patient was seen for a total of 60 minutes in face-to-face genetic counseling.  Drs. Magrinat, Lindi Adie and/or Burr Medico were available for discussion regarding this case.   _______________________________________________________________________ For Office Staff:  Number of people involved in session: 1 Was an Intern/ student involved with case: no

## 2019-01-22 ENCOUNTER — Other Ambulatory Visit: Payer: Self-pay | Admitting: Internal Medicine

## 2019-01-22 DIAGNOSIS — E349 Endocrine disorder, unspecified: Secondary | ICD-10-CM

## 2019-01-22 DIAGNOSIS — Z1509 Genetic susceptibility to other malignant neoplasm: Secondary | ICD-10-CM

## 2019-02-06 ENCOUNTER — Ambulatory Visit
Admission: RE | Admit: 2019-02-06 | Discharge: 2019-02-06 | Disposition: A | Discharge status: Home / Self Care | Payer: 59 | Source: Ambulatory Visit | Attending: Internal Medicine | Admitting: Internal Medicine

## 2019-02-06 DIAGNOSIS — E349 Endocrine disorder, unspecified: Secondary | ICD-10-CM

## 2019-02-06 DIAGNOSIS — Z1509 Genetic susceptibility to other malignant neoplasm: Secondary | ICD-10-CM

## 2019-02-06 IMAGING — US US THYROID
1 series · 13 of 25 positions shown · non-contrast
Comparison: None.

CLINICAL DATA: Elevated calcitonin, genetic predisposition to
medullary thyroid carcinoma

EXAM:
THYROID ULTRASOUND
TECHNIQUE: Ultrasound examination of the thyroid gland and adjacent soft
tissues was performed.

[Series 1: us thyroid · 0.04mm/px · 13 of 68 slices shown]
[im 1/68]
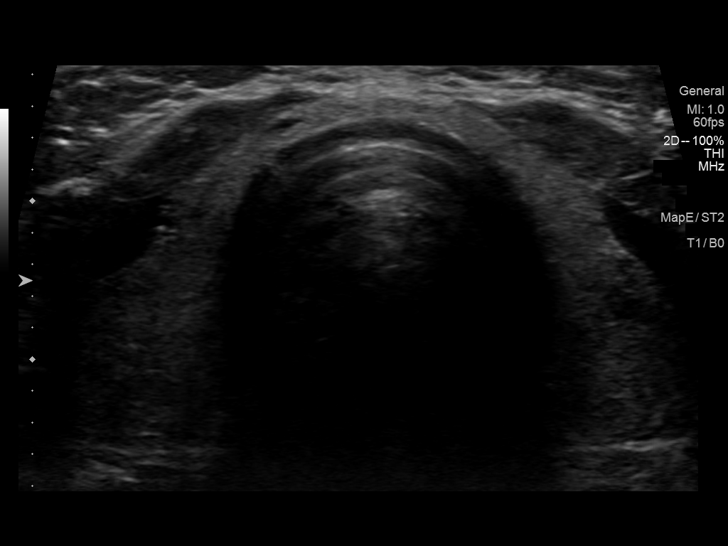
[im 6/68]
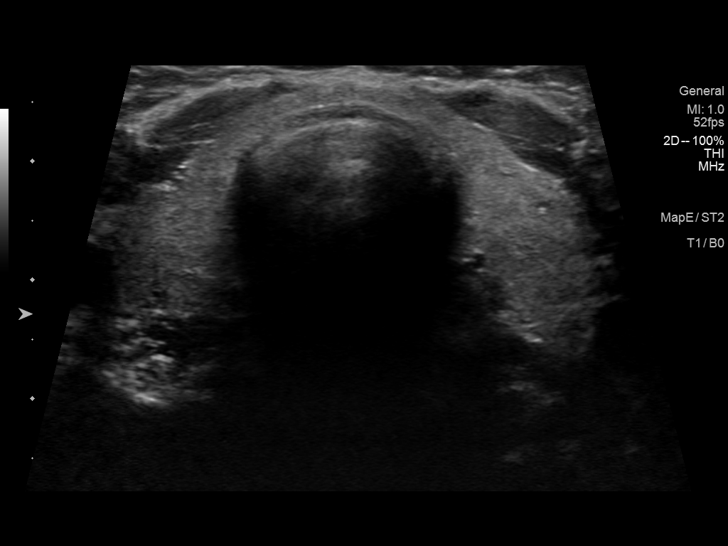
[im 12/68]
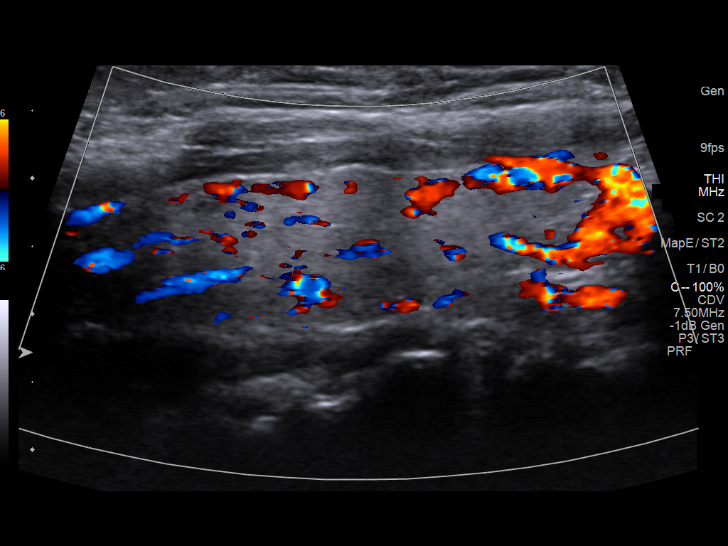
[im 17/68]
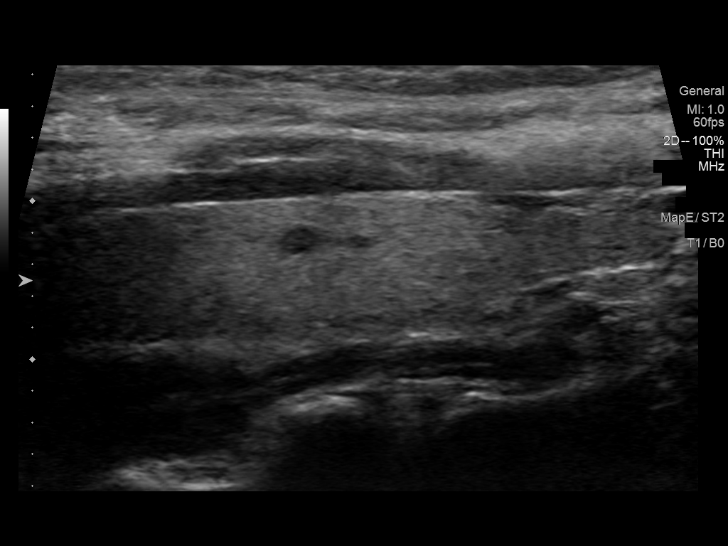
[im 23/68]
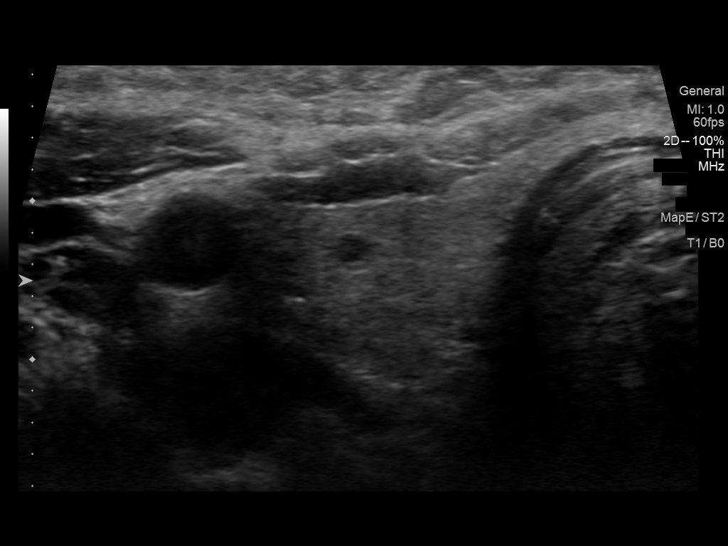
[im 28/68]
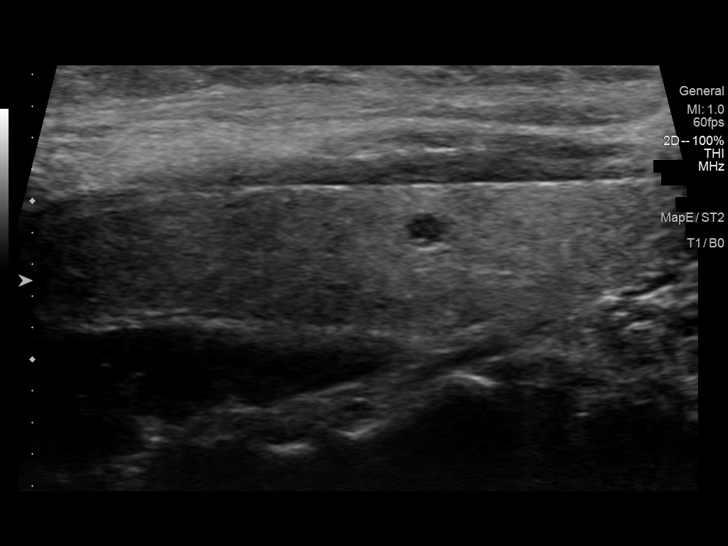
[im 34/68]
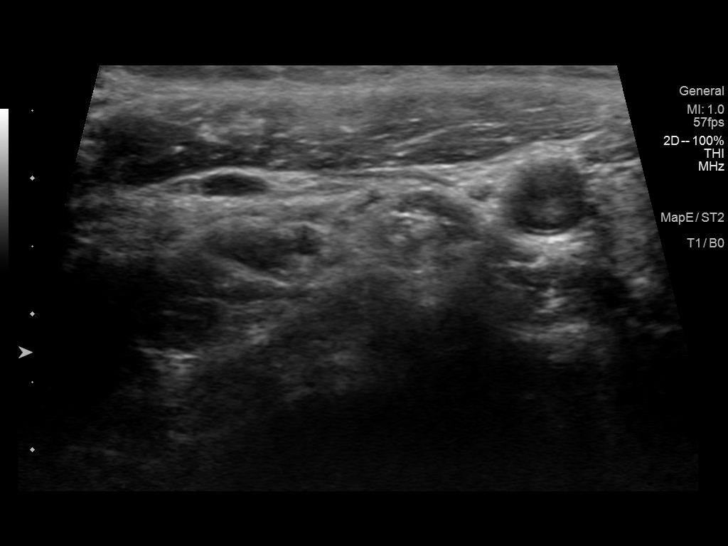
[im 40/68]
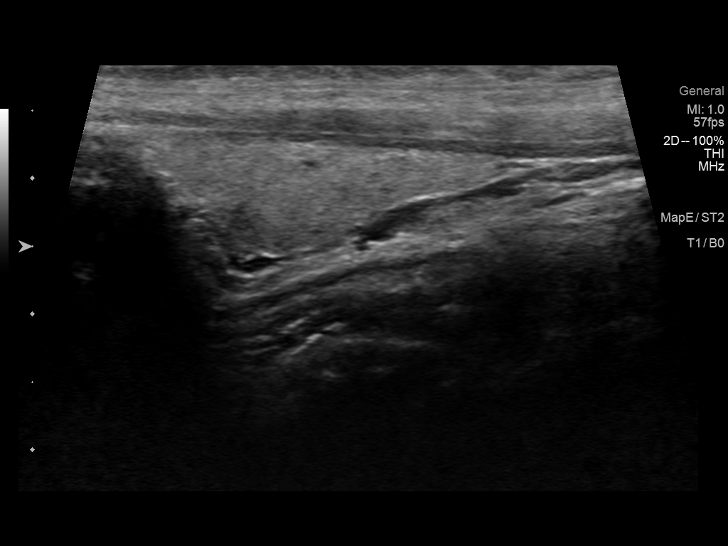
[im 45/68]
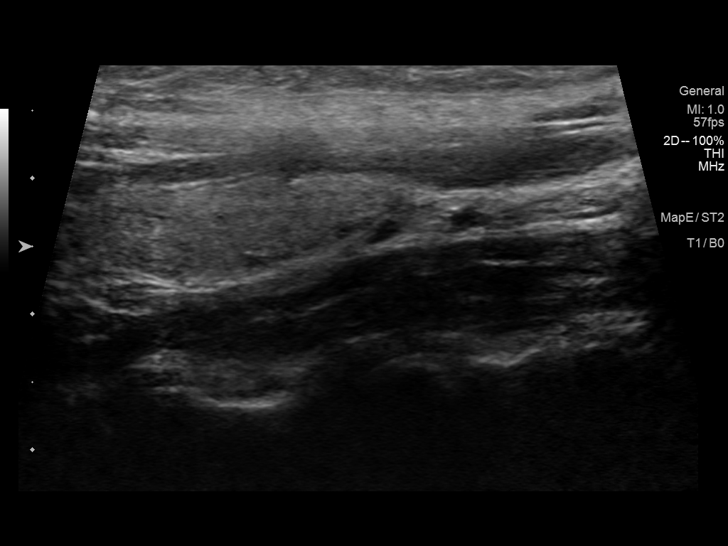
[im 51/68]
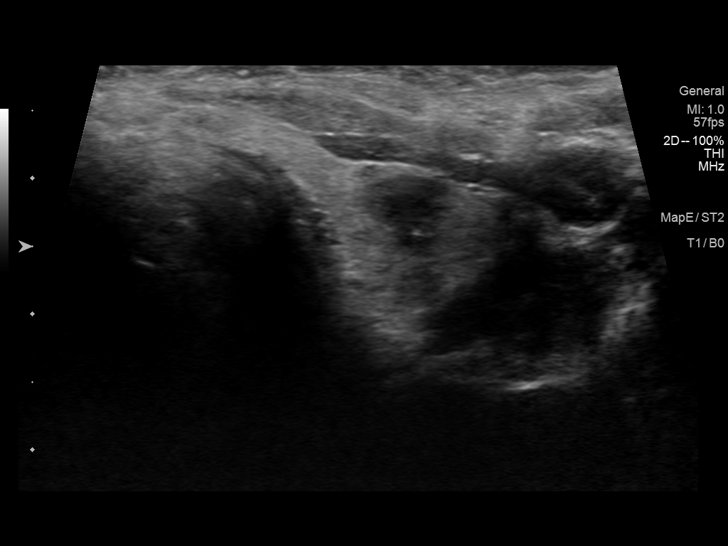
[im 56/68]
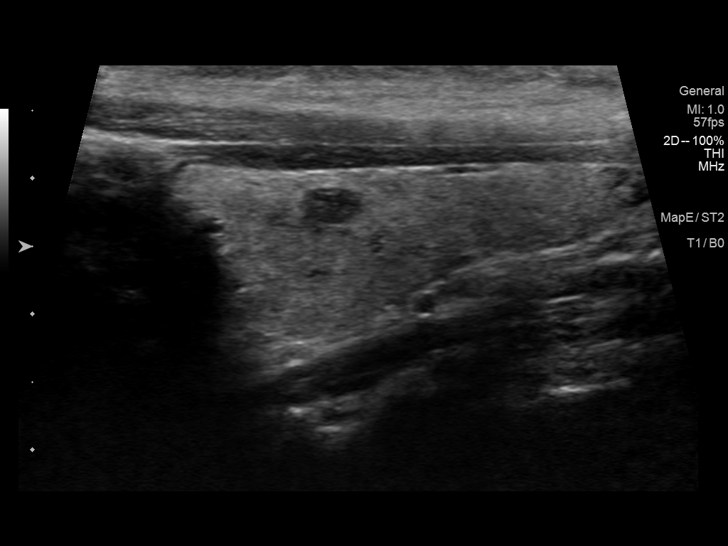
[im 62/68]
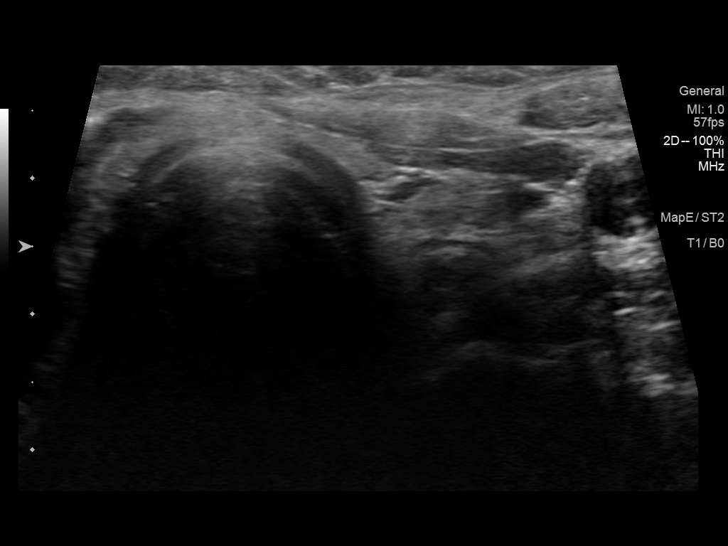
[im 68/68]
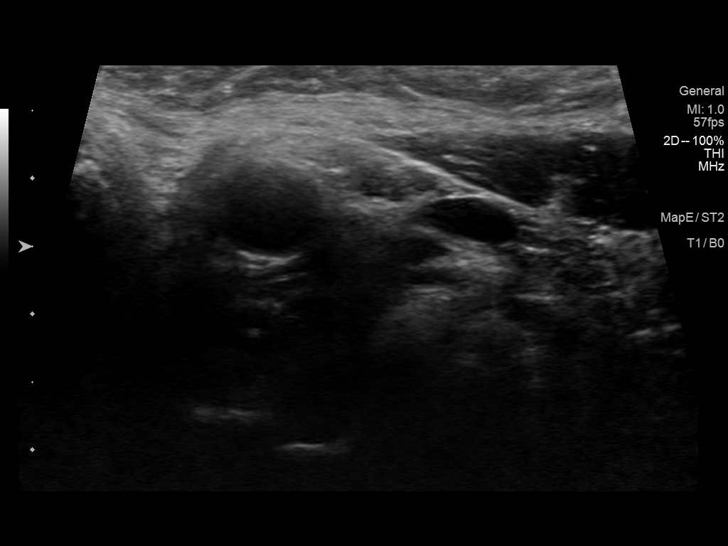

[13 of 25 positions shown; findings below may reference images not displayed]

FINDINGS: Parenchymal Echotexture: Mildly heterogenous

Isthmus: 0.2 cm thickness

Right lobe: 4.2 x 1.2 x 1.7 cm

Left lobe: 3.7 x 1.2 x 1.6 cm

_________________________________________________________

Estimated total number of nodules >/= 1 cm: 0

Number of spongiform nodules >/=  2 cm not described below (TR1): 0

Number of mixed cystic and solid nodules >/= 1.5 cm not described
below (TR2): 0

_________________________________________________________

Nodule # 1:

Location: Left; Mid

Maximum size: 0.8 cm; Other 2 dimensions: 0.7 x 0.7 cm

Composition: solid/almost completely solid (2)

Echogenicity: very hypoechoic (3)

Shape: not taller-than-wide (0)

Margins: ill-defined (0)

Echogenic foci: macrocalcifications (1)

ACR TI-RADS total points: 6.

ACR TI-RADS risk category: TR4 (4-6 points).

ACR TI-RADS recommendations:

Given size (<0.9 cm) and appearance, this nodule does NOT meet
TI-RADS criteria for biopsy or dedicated follow-up.

_________________________________________________________

A few additional bilateral scattered hypoechoic nodules all less
than 0.5 cm.
IMPRESSION: 1. Normal-sized thyroid with subcentimeter nodules. None meets
criteria for biopsy or dedicated imaging follow-up.

The above is in keeping with the ACR TI-RADS recommendations - [HOSPITAL] [NO];[DATE].

## 2019-03-09 DIAGNOSIS — C73 Malignant neoplasm of thyroid gland: Secondary | ICD-10-CM

## 2019-03-09 HISTORY — DX: Malignant neoplasm of thyroid gland: C73

## 2019-03-30 ENCOUNTER — Ambulatory Visit: Payer: Self-pay | Admitting: Surgery

## 2019-04-08 ENCOUNTER — Encounter (HOSPITAL_COMMUNITY): Payer: Self-pay | Admitting: Surgery

## 2019-04-08 DIAGNOSIS — E042 Nontoxic multinodular goiter: Secondary | ICD-10-CM | POA: Diagnosis present

## 2019-04-08 NOTE — H&P (Signed)
General [facility]  Kayla Little DOB: October 16, 1985 Married / Language: English / Race: White Female   History of Present Illness  The patient is a 34 year old female who presents with thyroid cancer.  CHIEF COMPLAINT: MEN-2a, medullary thyroid carcinoma  Patient is referred by Dr. Dagmar Hait for surgical evaluation and management of suspected medullary thyroid carcinoma arising in a patient with genetic mutation consistent with MEN-2a. Patient had been evaluated by her gynecologist, Dr. Bertis Ruddy, due to her mother's history of triple negative breast carcinoma. Dr. Ronita Hipps had the patient undergo genetic testing following childbirth last year. 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 Dagmar Hait. I do not have complete records at this time. We will obtain those from the office of the endocrinologist. However, the patient does give a history of having been tested for pheochromocytoma with normal catecholamine levels. Unfortunately, the patient was found to have an elevated calcitonin level. She underwent an ultrasound examination on February 07, 2019. This showed a normal size thyroid gland. The largest nodule was in the mid left thyroid lobe measuring 8 mm in size. There were additional bilateral scattered hypoechoic nodules all less than 5 mm in size. These findings were worrisome for possible medullary thyroid carcinoma. Patient is now referred to surgery for consideration for total thyroidectomy for definitive diagnosis. Patient has had no prior head or neck surgery. There is no family history of thyroid cancer. Patient has never been on thyroid medication. Her most recent TSH level in my records is from 2017 when it was normal at 1.31. 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   Diagnostic Studies History Colonoscopy  never Mammogram  never Pap Smear   1-5 years ago  Allergies No Known Drug Allergies  Allergies Reconciled   Medication History Vyvanse (20MG Capsule, Oral) Active. Sertraline HCl (50MG Tablet, Oral) Active. Medications Reconciled  Social History Alcohol use  Moderate alcohol use. Caffeine use  Coffee, Tea. Illicit drug use  Uses weekly. Tobacco use  Current every day smoker.  Family History Breast Cancer  Mother. Prostate Cancer  Father.  Pregnancy / Birth History  Age at menarche  49 years. Contraceptive History  Intrauterine device. Gravida  3 Length (months) of breastfeeding  3-6 Maternal age  76-30 Para  2 Regular periods   Other Problems  Kidney Stone  Oophorectomy   Review of Systems General Not Present- Appetite Loss, Chills, Fatigue, Fever, Night Sweats, Weight Gain and Weight Loss. Skin Not Present- Change in Wart/Mole, Dryness, Hives, Jaundice, New Lesions, Non-Healing Wounds, Rash and Ulcer. HEENT Not Present- Earache, Hearing Loss, Hoarseness, Nose Bleed, Oral Ulcers, Ringing in the Ears, Seasonal Allergies, Sinus Pain, Sore Throat, Visual Disturbances, Wears glasses/contact lenses and Yellow Eyes. Respiratory Not Present- Bloody sputum, Chronic Cough, Difficulty Breathing, Snoring and Wheezing. Breast Not Present- Breast Mass, Breast Pain, Nipple Discharge and Skin Changes. Cardiovascular Not Present- Chest Pain, Difficulty Breathing Lying Down, Leg Cramps, Palpitations, Rapid Heart Rate, Shortness of Breath and Swelling of Extremities. Gastrointestinal Not Present- Abdominal Pain, Bloating, Bloody Stool, Change in Bowel Habits, Chronic diarrhea, Constipation, Difficulty Swallowing, Excessive gas, Gets full quickly at meals, Hemorrhoids, Indigestion, Nausea, Rectal Pain and Vomiting. Female Genitourinary Not Present- Frequency, Nocturia, Painful Urination, Pelvic Pain and Urgency. Musculoskeletal Not Present- Back Pain, Joint Pain, Joint Stiffness, Muscle Pain, Muscle Weakness  and Swelling of Extremities. Neurological Not Present- Decreased  Memory, Fainting, Headaches, Numbness, Seizures, Tingling, Tremor, Trouble walking and Weakness. Psychiatric Not Present- Anxiety, Bipolar, Change in Sleep Pattern, Depression, Fearful and Frequent crying. Endocrine Not Present- Cold Intolerance, Excessive Hunger, Hair Changes, Heat Intolerance, Hot flashes and New Diabetes. Hematology Not Present- Blood Thinners, Easy Bruising, Excessive bleeding, Gland problems, HIV and Persistent Infections.  Vitals  Weight: 161.8 lb Height: 66in Body Surface Area: 1.83 m Body Mass Index: 26.11 kg/m  Temp.: 97.34F(Thermal Scan)  Pulse: 78 (Regular)  BP: 120/68 (Sitting, Left Arm, Standard)  Physical Exam   GENERAL APPEARANCE Development: normal Nutritional status: normal Gross deformities: none  SKIN Rash, lesions, ulcers: none Induration, erythema: none Nodules: none palpable  EYES Conjunctiva and lids: normal Pupils: equal and reactive Iris: normal bilaterally  EARS, NOSE, MOUTH, THROAT External ears: no lesion or deformity External nose: no lesion or deformity Hearing: grossly normal Patient is wearing a mask.  NECK Symmetric: yes Trachea: midline Thyroid: no palpable nodules in the thyroid bed  CHEST Respiratory effort: normal Retraction or accessory muscle use: no Breath sounds: normal bilaterally Rales, rhonchi, wheeze: none  CARDIOVASCULAR Auscultation: regular rhythm, normal rate Murmurs: none Pulses: carotid and radial pulse 2+ palpable Lower extremity edema: none Lower extremity varicosities: none  MUSCULOSKELETAL Station and gait: normal Digits and nails: no clubbing or cyanosis Muscle strength: grossly normal all extremities Range of motion: grossly normal all extremities Deformity: none  LYMPHATIC Cervical: none palpable Supraclavicular: none palpable  PSYCHIATRIC Oriented to person, place, and time: yes Mood and affect:  normal for situation Judgment and insight: appropriate for situation    Assessment & Plan  MEN 2A (MULTIPLE ENDOCRINE NEOPLASIA, 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 Dagmar Hait for surgical evaluation for total thyroidectomy for management of suspected medullary thyroid carcinoma arising in the setting of MEN-2a. Patient provided with a copy of "The Thyroid Book: Medical and Surgical Treatment of Thyroid Problems", published by [employer], 16 pages. Book reviewed and explained to patient during visit today.  The patient and I today discussed her workup and evaluation performed by Dr. Buddy Duty. We discussed total thyroidectomy in detail. We discussed the rationale behind thyroidectomy. We will also plan to sample a limited amount of lymph nodes from the anterior neck. We discussed the risk and benefits of the surgery including the risk of recurrent laryngeal nerve injury and injury to parathyroid glands. We discussed the location and size of the surgical incision. We discussed the hospital stay to be anticipated as well as her postoperative recovery and return to work and activities. We discussed the potential need for additional surgery. We discussed the need for lifelong thyroid hormone replacement. The patient understands and wishes to proceed with surgery in the near future.  The risks and benefits of the procedure have been discussed at length with the patient. The patient understands the proposed procedure, potential alternative treatments, and the course of recovery to be expected. All of the patient's questions have been answered at this time. The patient wishes to proceed with surgery.  Armandina Gemma, MD [facility] Office: [phone]

## 2019-04-09 ENCOUNTER — Other Ambulatory Visit (HOSPITAL_COMMUNITY)
Admission: RE | Admit: 2019-04-09 | Discharge: 2019-04-09 | Disposition: A | Discharge status: Home / Self Care | Payer: 59 | Source: Ambulatory Visit | Attending: Surgery | Admitting: Surgery

## 2019-04-09 DIAGNOSIS — Z01812 Encounter for preprocedural laboratory examination: Secondary | ICD-10-CM | POA: Insufficient documentation

## 2019-04-09 DIAGNOSIS — Z20822 Contact with and (suspected) exposure to covid-19: Secondary | ICD-10-CM | POA: Diagnosis not present

## 2019-04-09 LAB — SARS CORONAVIRUS 2 (TAT 6-24 HRS): SARS Coronavirus 2: NEGATIVE

## 2019-04-10 ENCOUNTER — Encounter (HOSPITAL_COMMUNITY): Payer: Self-pay

## 2019-04-10 ENCOUNTER — Other Ambulatory Visit: Payer: Self-pay

## 2019-04-10 ENCOUNTER — Ambulatory Visit (HOSPITAL_COMMUNITY)
Admission: RE | Admit: 2019-04-10 | Discharge: 2019-04-10 | Disposition: A | Discharge status: Home / Self Care | Payer: 59 | Source: Ambulatory Visit | Attending: Anesthesiology | Admitting: Anesthesiology

## 2019-04-10 ENCOUNTER — Encounter (HOSPITAL_COMMUNITY)
Admission: RE | Admit: 2019-04-10 | Discharge: 2019-04-10 | Disposition: A | Discharge status: Home / Self Care | Payer: 59 | Source: Ambulatory Visit | Attending: Surgery | Admitting: Surgery

## 2019-04-10 ENCOUNTER — Other Ambulatory Visit (HOSPITAL_COMMUNITY): Payer: 59

## 2019-04-10 DIAGNOSIS — Z01811 Encounter for preprocedural respiratory examination: Secondary | ICD-10-CM

## 2019-04-10 HISTORY — DX: Malignant neoplasm of thyroid gland: C73

## 2019-04-10 LAB — CBC
HCT: 43.8 % (ref 36.0–46.0)
Hemoglobin: 14.3 g/dL (ref 12.0–15.0)
MCH: 33.9 pg (ref 26.0–34.0)
MCHC: 32.6 g/dL (ref 30.0–36.0)
MCV: 103.8 fL — ABNORMAL HIGH (ref 80.0–100.0)
Platelets: 302 10*3/uL (ref 150–400)
RBC: 4.22 MIL/uL (ref 3.87–5.11)
RDW: 12.9 % (ref 11.5–15.5)
WBC: 12.1 10*3/uL — ABNORMAL HIGH (ref 4.0–10.5)
nRBC: 0 % (ref 0.0–0.2)

## 2019-04-10 LAB — BASIC METABOLIC PANEL
Anion gap: 11 (ref 5–15)
BUN: 10 mg/dL (ref 6–20)
CO2: 21 mmol/L — ABNORMAL LOW (ref 22–32)
Calcium: 9.5 mg/dL (ref 8.9–10.3)
Chloride: 106 mmol/L (ref 98–111)
Creatinine, Ser: 0.72 mg/dL (ref 0.44–1.00)
GFR calc Af Amer: >60 mL/min (ref >60)
GFR calc non Af Amer: >60 mL/min (ref >60)
Glucose, Bld: 91 mg/dL (ref 70–99)
Potassium: 3.7 mmol/L (ref 3.5–5.1)
Sodium: 138 mmol/L (ref 135–145)

## 2019-04-10 IMAGING — DX DG CHEST 2V
2 series · 2 of 2 positions shown · non-contrast
Comparison: None.

CLINICAL DATA: Preoperative evaluation

EXAM:
CHEST - 2 VIEW

[chest pa]
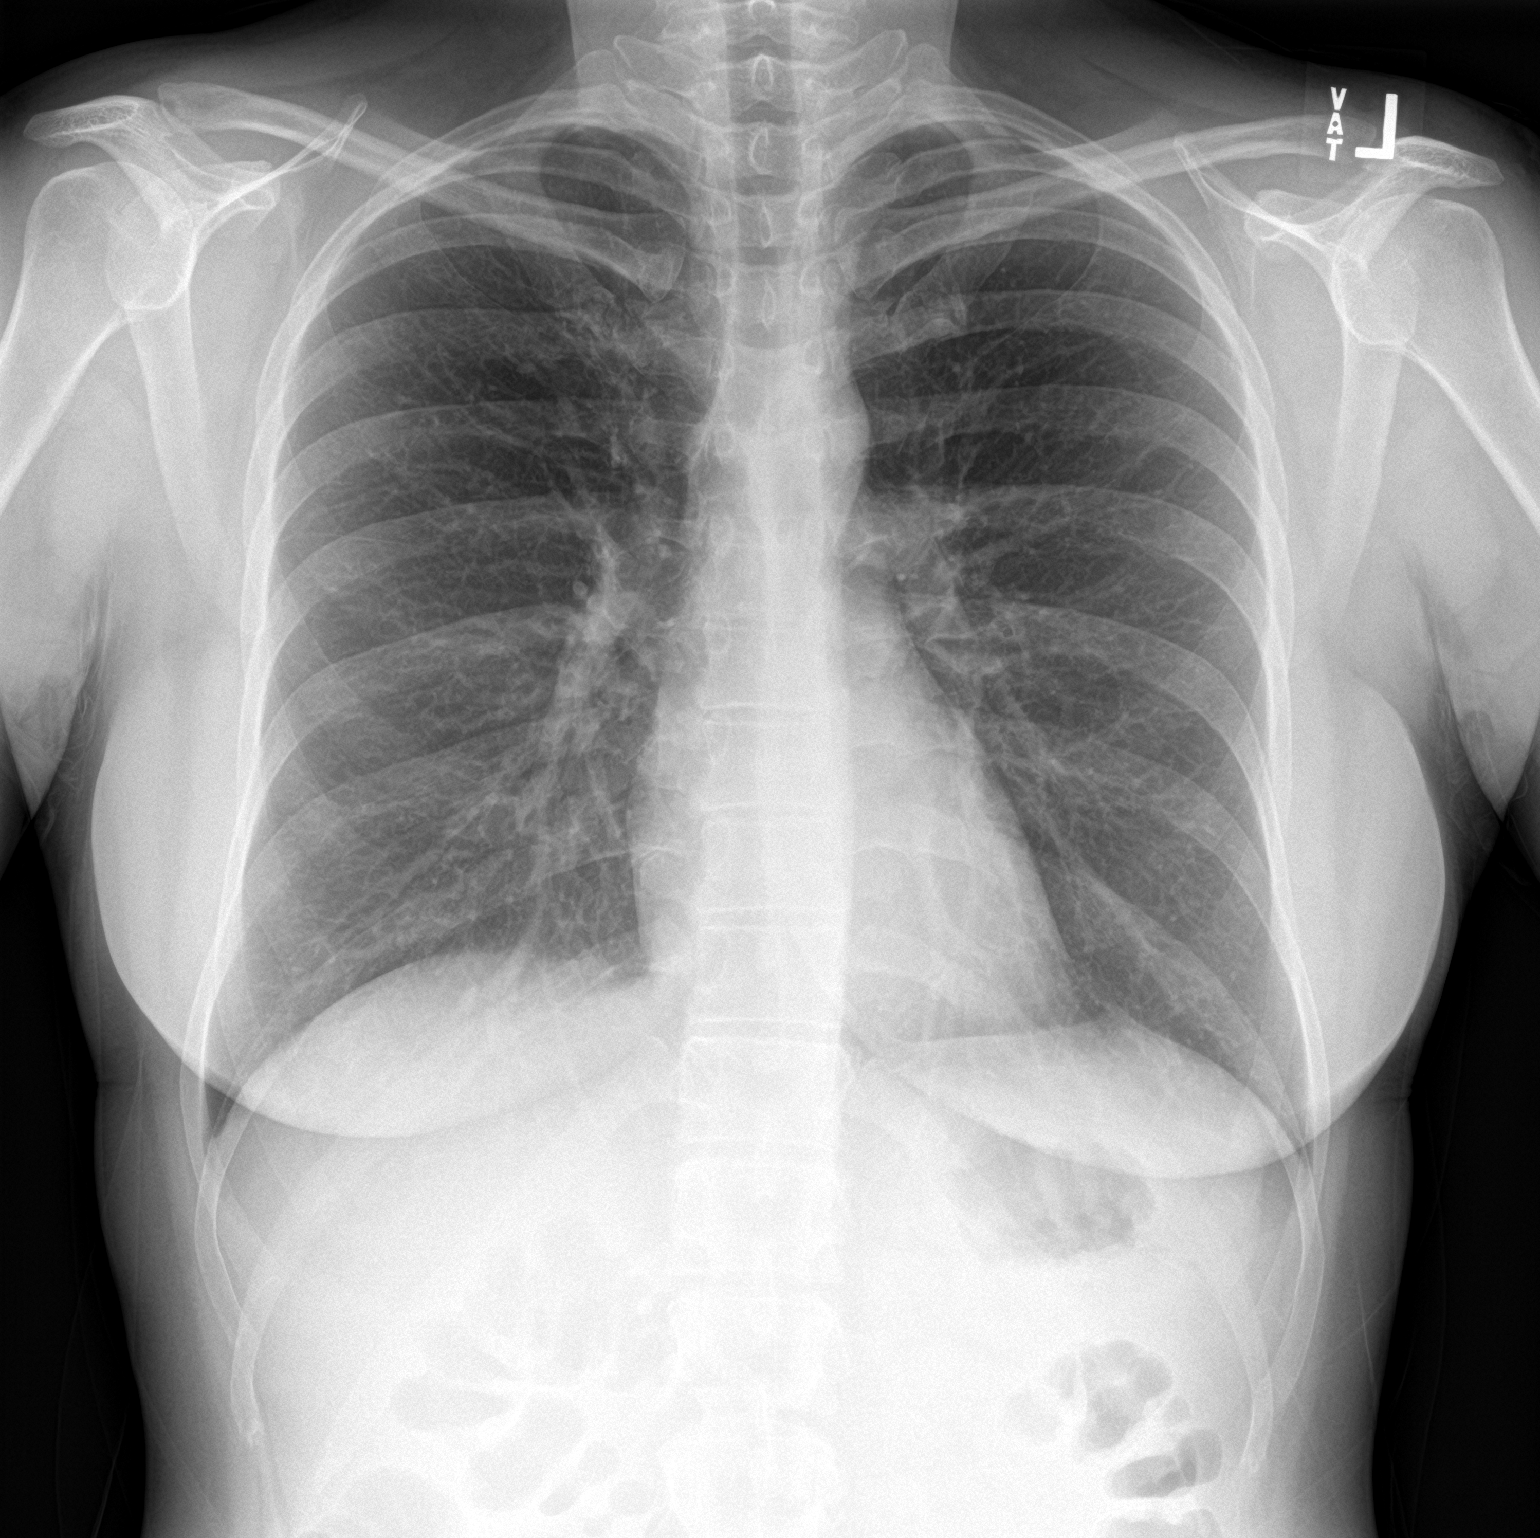

[chest lat]
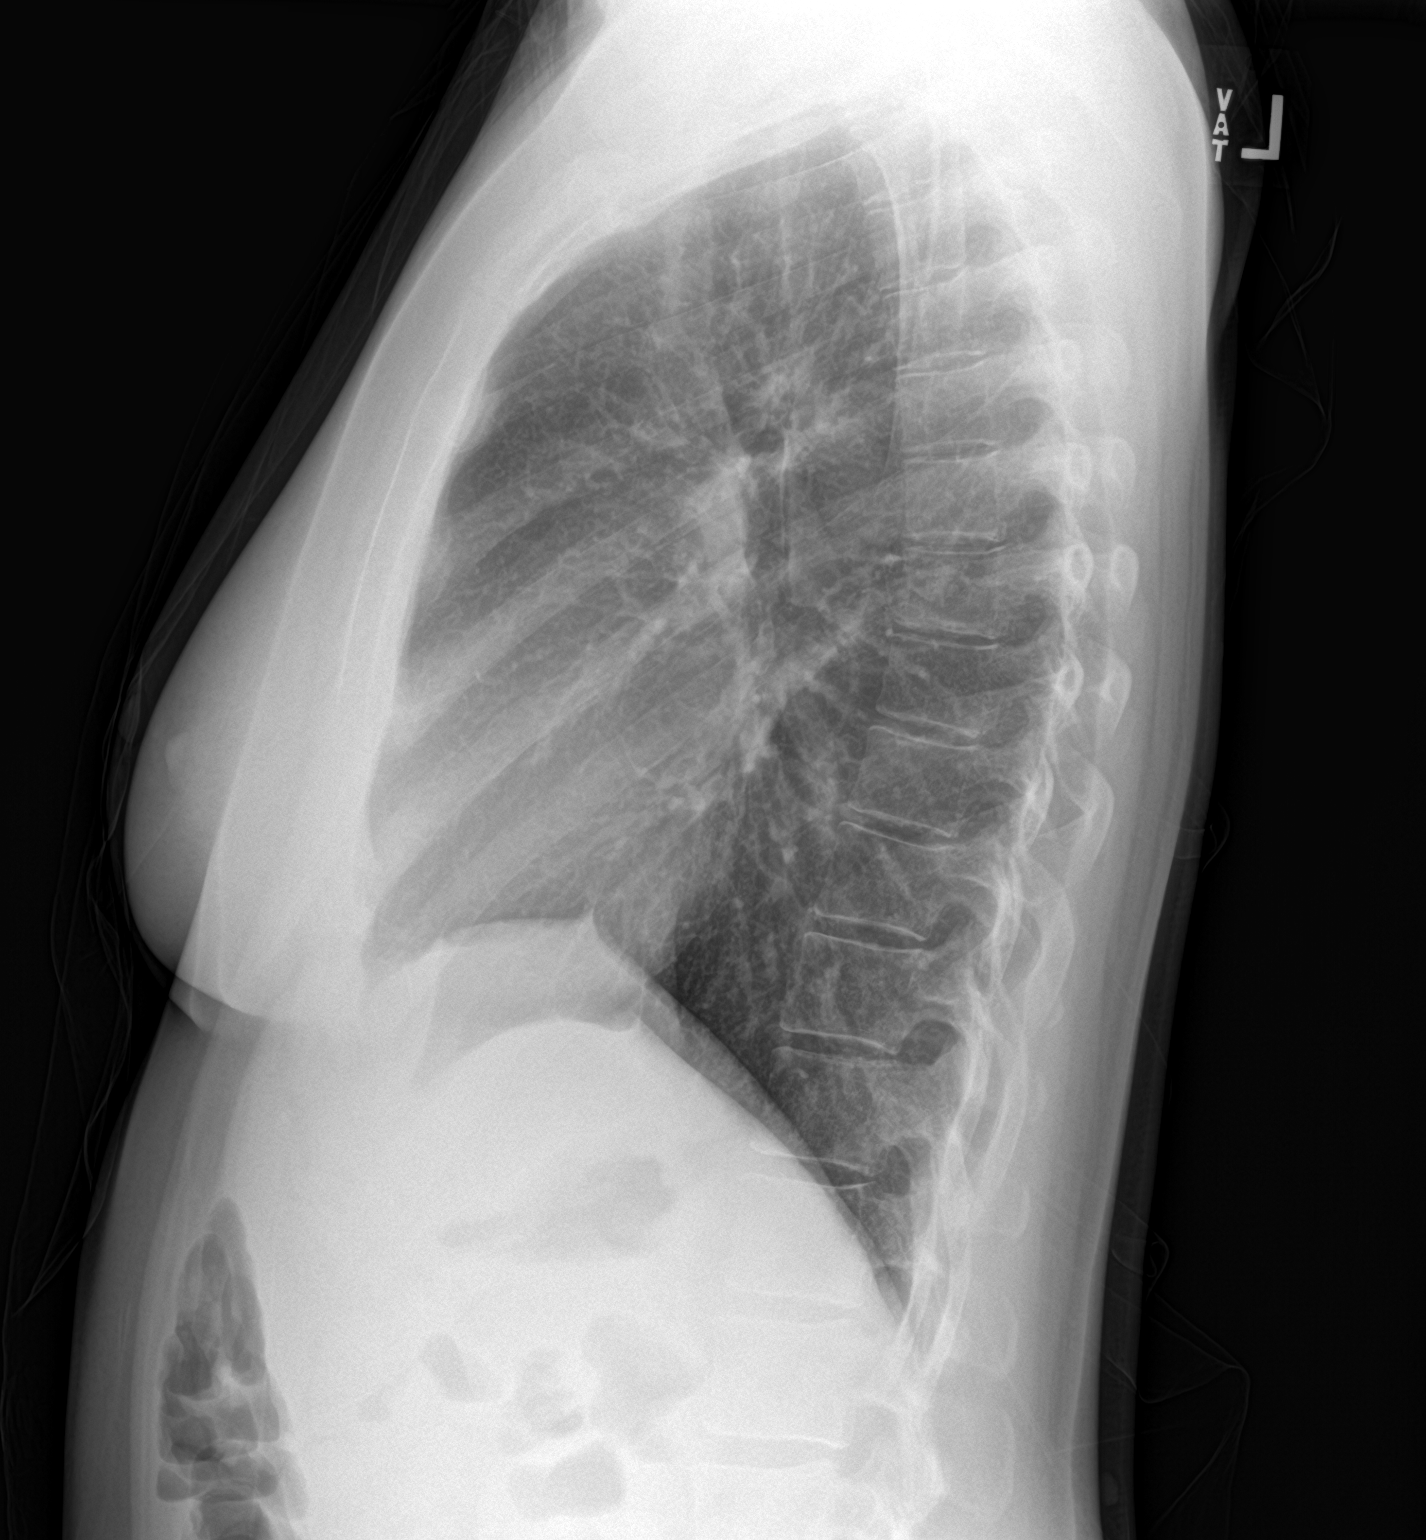

[2 of 2 positions shown; findings below may reference images not displayed]

FINDINGS: The heart size and mediastinal contours are within normal limits.
Both lungs are clear. The visualized skeletal structures are
unremarkable.
IMPRESSION: No active cardiopulmonary disease.

## 2019-04-10 NOTE — Patient Instructions (Addendum)
DUE TO COVID-19 ONLY ONE VISITOR IS ALLOWED TO COME WITH YOU AND STAY IN THE WAITING ROOM ONLY DURING PRE OP AND PROCEDURE DAY OF SURGERY. THE 1 VISITOR MAY VISIT WITH YOU AFTER SURGERY IN YOUR PRIVATE ROOM DURING VISITING HOURS ONLY!    YOUR COVID TEST IS COMPLETED, PLEASE BEGIN THE QUARANTINE INSTRUCTIONS AS OUTLINED IN YOUR HANDOUT.                [name]   Your procedure is scheduled on: 04/12/2019   Report to [facility] Main  Entrance   Report to admitting at 7:30AM     Call this number if you have problems the morning of surgery [phone]    Remember: Do not eat food or drink liquids :After Midnight. BRUSH YOUR TEETH MORNING OF SURGERY AND RINSE YOUR MOUTH OUT, NO CHEWING GUM CANDY OR MINTS.   No smoking 24 hours prior to surgery     Take these medicines the morning of surgery with A SIP OF WATER:  None                                  You may not have any metal on your body including hair pins and              piercings  Do not wear jewelry, make-up, lotions, powders or perfumes, deodorant             Do not wear nail polish on your fingernails.  Do not shave  48 hours prior to surgery.              Men may shave face and neck.   Do not bring valuables to the hospital. [place].  Contacts, dentures or bridgework may not be worn into surgery.  YOU MAY BRING A SMALL OVERNIGHT BAG                   Please read over the following fact sheets you were given: _____________________________________________________________________    [facility] - Preparing for Surgery Before surgery, you can play an important role.  Because skin is not sterile, your skin needs to be as free of germs as possible.  You can reduce the number of germs on your skin by washing with CHG (chlorahexidine gluconate) soap before surgery.  CHG is an antiseptic cleaner which kills germs and bonds with the skin to continue killing germs  even after washing. Please DO NOT use if you have an allergy to CHG or antibacterial soaps.  If your skin becomes reddened/irritated stop using the CHG and inform your nurse when you arrive at Short Stay. Do not shave (including legs and underarms) for at least 48 hours prior to the first CHG shower.  You may shave your face/neck. Please follow these instructions carefully:  1.  Shower with CHG Soap the night before surgery and the  morning of Surgery.  2.  If you choose to wash your hair, wash your hair first as usual with your  normal  shampoo.  3.  After you shampoo, rinse your hair and body thoroughly to remove the  shampoo.                           4.  Use  CHG as you would any other liquid soap.  You can apply chg directly  to the skin and wash                       Gently with a scrungie or clean washcloth.  5.  Apply the CHG Soap to your body ONLY FROM THE NECK DOWN.   Do not use on face/ open                           Wound or open sores. Avoid contact with eyes, ears mouth and genitals (private parts).                       Wash face,  Genitals (private parts) with your normal soap.             6.  Wash thoroughly, paying special attention to the area where your surgery  will be performed.  7.  Thoroughly rinse your body with warm water from the neck down.  8.  DO NOT shower/wash with your normal soap after using and rinsing off  the CHG Soap.                9.  Pat yourself dry with a clean towel.            10.  Wear clean pajamas.            11.  Place clean sheets on your bed the night of your first shower and do not  sleep with pets. Day of Surgery : Do not apply any lotions/deodorants the morning of surgery.  Please wear clean clothes to the hospital/surgery center.  FAILURE TO FOLLOW THESE INSTRUCTIONS MAY RESULT IN THE CANCELLATION OF YOUR SURGERY PATIENT SIGNATURE_________________________________  NURSE  SIGNATURE__________________________________  ________________________________________________________________________

## 2019-04-10 NOTE — Progress Notes (Signed)
PCP - provider [employer], [place] @ [place] [facility] -   Chest x-ray - ordered for 04/10/2019 EKG -  Stress Test -  ECHO -  Cardiac Cath -   Sleep Study -  CPAP -   Fasting Blood Sugar -  Checks Blood Sugar _____ times a day  Blood Thinner Instructions: Aspirin Instructions: Last Dose:  Anesthesia review:   Patient denies shortness of breath, fever, cough and chest pain at PAT appointment   Patient verbalized understanding of instructions that were given to them at the PAT appointment. Patient was also instructed that they will need to review over the PAT instructions again at home before surgery.

## 2019-04-12 ENCOUNTER — Ambulatory Visit (HOSPITAL_COMMUNITY): Payer: 59 | Admitting: Physician Assistant

## 2019-04-12 ENCOUNTER — Other Ambulatory Visit: Payer: Self-pay

## 2019-04-12 ENCOUNTER — Ambulatory Visit (HOSPITAL_COMMUNITY)
Admission: RE | Admit: 2019-04-12 | Discharge: 2019-04-13 | Disposition: A | Discharge status: Home / Self Care | Payer: 59 | Attending: Surgery | Admitting: Surgery

## 2019-04-12 ENCOUNTER — Encounter (HOSPITAL_COMMUNITY): Payer: Self-pay | Admitting: Surgery

## 2019-04-12 ENCOUNTER — Ambulatory Visit (HOSPITAL_COMMUNITY): Payer: 59 | Admitting: Anesthesiology

## 2019-04-12 ENCOUNTER — Encounter (HOSPITAL_COMMUNITY)
Admission: RE | Disposition: A | Discharge status: Home / Self Care | Payer: Self-pay | Source: Home / Self Care | Attending: Surgery

## 2019-04-12 DIAGNOSIS — Z148 Genetic carrier of other disease: Secondary | ICD-10-CM

## 2019-04-12 DIAGNOSIS — Z79899 Other long term (current) drug therapy: Secondary | ICD-10-CM | POA: Insufficient documentation

## 2019-04-12 DIAGNOSIS — F17200 Nicotine dependence, unspecified, uncomplicated: Secondary | ICD-10-CM | POA: Insufficient documentation

## 2019-04-12 DIAGNOSIS — E042 Nontoxic multinodular goiter: Secondary | ICD-10-CM | POA: Diagnosis not present

## 2019-04-12 DIAGNOSIS — C73 Malignant neoplasm of thyroid gland: Secondary | ICD-10-CM | POA: Diagnosis not present

## 2019-04-12 DIAGNOSIS — E3122 Multiple endocrine neoplasia [MEN] type IIA: Secondary | ICD-10-CM | POA: Diagnosis present

## 2019-04-12 HISTORY — PX: THYROIDECTOMY: SHX17

## 2019-04-12 LAB — PREGNANCY, URINE: Preg Test, Ur: NEGATIVE

## 2019-04-12 SURGERY — THYROIDECTOMY
Anesthesia: General | Site: Neck

## 2019-04-12 MED ORDER — KETOROLAC TROMETHAMINE 30 MG/ML IJ SOLN
INTRAMUSCULAR | Status: AC
  Filled 2019-04-12: qty 1

## 2019-04-12 MED ORDER — LIDOCAINE 2% (20 MG/ML) 5 ML SYRINGE
INTRAMUSCULAR | Status: DC | PRN
  Administered 2019-04-12: 50 mg via INTRAVENOUS

## 2019-04-12 MED ORDER — CHLORHEXIDINE GLUCONATE CLOTH 2 % EX PADS: 6.0000 | MEDICATED_PAD | Freq: Once | CUTANEOUS | Status: DC

## 2019-04-12 MED ORDER — LACTATED RINGERS IV SOLN: INTRAVENOUS | Status: DC

## 2019-04-12 MED ORDER — FENTANYL CITRATE (PF) 250 MCG/5ML IJ SOLN
INTRAMUSCULAR | Status: DC | PRN
  Administered 2019-04-12 (×2): 50 ug via INTRAVENOUS
  Administered 2019-04-12: 100 ug via INTRAVENOUS
  Administered 2019-04-12: 50 ug via INTRAVENOUS
  Administered 2019-04-12: 100 ug via INTRAVENOUS

## 2019-04-12 MED ORDER — SUGAMMADEX SODIUM 200 MG/2ML IV SOLN
INTRAVENOUS | Status: DC | PRN
  Administered 2019-04-12: 150 mg via INTRAVENOUS

## 2019-04-12 MED ORDER — ALBUMIN HUMAN 5 % IV SOLN: INTRAVENOUS | Status: DC | PRN

## 2019-04-12 MED ORDER — DEXAMETHASONE SODIUM PHOSPHATE 10 MG/ML IJ SOLN
INTRAMUSCULAR | Status: DC | PRN
  Administered 2019-04-12: 10 mg via INTRAVENOUS

## 2019-04-12 MED ORDER — PROPOFOL 10 MG/ML IV BOLUS
INTRAVENOUS | Status: DC | PRN
  Administered 2019-04-12: 130 mg via INTRAVENOUS

## 2019-04-12 MED ORDER — ACETAMINOPHEN 650 MG RE SUPP: 650.0000 mg | Freq: Four times a day (QID) | RECTAL | Status: DC | PRN

## 2019-04-12 MED ORDER — ROCURONIUM BROMIDE 10 MG/ML (PF) SYRINGE
PREFILLED_SYRINGE | INTRAVENOUS | Status: AC
  Filled 2019-04-12: qty 10

## 2019-04-12 MED ORDER — ACETAMINOPHEN 325 MG PO TABS: 325.0000 mg | ORAL_TABLET | Freq: Once | ORAL | Status: DC | PRN

## 2019-04-12 MED ORDER — FENTANYL CITRATE (PF) 100 MCG/2ML IJ SOLN
INTRAMUSCULAR | Status: AC
  Filled 2019-04-12: qty 2

## 2019-04-12 MED ORDER — ACETAMINOPHEN 160 MG/5ML PO SOLN: 325.0000 mg | Freq: Once | ORAL | Status: DC | PRN

## 2019-04-12 MED ORDER — CALCIUM CARBONATE 1250 (500 CA) MG PO TABS
2.0000 | ORAL_TABLET | Freq: Three times a day (TID) | ORAL | Status: DC
  Administered 2019-04-12 – 2019-04-13 (×2): 1000 mg via ORAL
  Filled 2019-04-12 (×3): qty 1

## 2019-04-12 MED ORDER — ACETAMINOPHEN 325 MG PO TABS: 650.0000 mg | ORAL_TABLET | Freq: Four times a day (QID) | ORAL | Status: DC | PRN

## 2019-04-12 MED ORDER — ONDANSETRON HCL 4 MG/2ML IJ SOLN
INTRAMUSCULAR | Status: DC | PRN
  Administered 2019-04-12: 4 mg via INTRAVENOUS

## 2019-04-12 MED ORDER — DEXAMETHASONE SODIUM PHOSPHATE 10 MG/ML IJ SOLN
INTRAMUSCULAR | Status: AC
  Filled 2019-04-12: qty 1

## 2019-04-12 MED ORDER — ACETAMINOPHEN 10 MG/ML IV SOLN
INTRAVENOUS | Status: DC | PRN
  Administered 2019-04-12: 1000 mg via INTRAVENOUS

## 2019-04-12 MED ORDER — ACETAMINOPHEN 10 MG/ML IV SOLN
INTRAVENOUS | Status: AC
  Filled 2019-04-12: qty 100

## 2019-04-12 MED ORDER — CEFAZOLIN SODIUM-DEXTROSE 2-4 GM/100ML-% IV SOLN
2.0000 g | INTRAVENOUS | Status: AC
  Administered 2019-04-12: 2 g via INTRAVENOUS
  Filled 2019-04-12: qty 100

## 2019-04-12 MED ORDER — OXYCODONE HCL 5 MG PO TABS
5.0000 mg | ORAL_TABLET | ORAL | Status: DC | PRN
  Administered 2019-04-12 – 2019-04-13 (×4): 10 mg via ORAL
  Filled 2019-04-12 (×4): qty 2

## 2019-04-12 MED ORDER — OXYCODONE HCL 5 MG PO TABS: 5.0000 mg | ORAL_TABLET | Freq: Once | ORAL | Status: DC | PRN

## 2019-04-12 MED ORDER — HYDROMORPHONE HCL 1 MG/ML IJ SOLN
1.0000 mg | INTRAMUSCULAR | Status: DC | PRN
  Administered 2019-04-12 – 2019-04-13 (×6): 1 mg via INTRAVENOUS
  Filled 2019-04-12 (×5): qty 1

## 2019-04-12 MED ORDER — ROCURONIUM BROMIDE 10 MG/ML (PF) SYRINGE
PREFILLED_SYRINGE | INTRAVENOUS | Status: DC | PRN
  Administered 2019-04-12: 20 mg via INTRAVENOUS
  Administered 2019-04-12: 10 mg via INTRAVENOUS
  Administered 2019-04-12: 50 mg via INTRAVENOUS
  Administered 2019-04-12: 5 mg via INTRAVENOUS

## 2019-04-12 MED ORDER — PROPOFOL 10 MG/ML IV BOLUS
INTRAVENOUS | Status: AC
  Filled 2019-04-12: qty 20

## 2019-04-12 MED ORDER — KETOROLAC TROMETHAMINE 30 MG/ML IJ SOLN
INTRAMUSCULAR | Status: DC | PRN
  Administered 2019-04-12: 30 mg via INTRAVENOUS

## 2019-04-12 MED ORDER — ONDANSETRON HCL 4 MG/2ML IJ SOLN
INTRAMUSCULAR | Status: AC
  Filled 2019-04-12: qty 2

## 2019-04-12 MED ORDER — LIDOCAINE 2% (20 MG/ML) 5 ML SYRINGE
INTRAMUSCULAR | Status: AC
  Filled 2019-04-12: qty 5

## 2019-04-12 MED ORDER — TRAMADOL HCL 50 MG PO TABS: 50.0000 mg | ORAL_TABLET | Freq: Four times a day (QID) | ORAL | Status: DC | PRN

## 2019-04-12 MED ORDER — HYDROMORPHONE HCL 1 MG/ML IJ SOLN
INTRAMUSCULAR | Status: AC
  Filled 2019-04-12: qty 2

## 2019-04-12 MED ORDER — ALBUMIN HUMAN 5 % IV SOLN
INTRAVENOUS | Status: AC
  Filled 2019-04-12: qty 250

## 2019-04-12 MED ORDER — HYDROMORPHONE HCL 1 MG/ML IJ SOLN
0.2500 mg | INTRAMUSCULAR | Status: DC | PRN
  Administered 2019-04-12 (×4): 0.5 mg via INTRAVENOUS

## 2019-04-12 MED ORDER — ONDANSETRON HCL 4 MG/2ML IJ SOLN: 4.0000 mg | Freq: Four times a day (QID) | INTRAMUSCULAR | Status: DC | PRN

## 2019-04-12 MED ORDER — 0.9 % SODIUM CHLORIDE (POUR BTL) OPTIME
TOPICAL | Status: DC | PRN
  Administered 2019-04-12: 1000 mL

## 2019-04-12 MED ORDER — FENTANYL CITRATE (PF) 250 MCG/5ML IJ SOLN
INTRAMUSCULAR | Status: AC
  Filled 2019-04-12: qty 5

## 2019-04-12 MED ORDER — SERTRALINE HCL 50 MG PO TABS
50.0000 mg | ORAL_TABLET | Freq: Every day | ORAL | Status: DC
  Administered 2019-04-12: 50 mg via ORAL
  Filled 2019-04-12: qty 1

## 2019-04-12 MED ORDER — ONDANSETRON 4 MG PO TBDP: 4.0000 mg | ORAL_TABLET | Freq: Four times a day (QID) | ORAL | Status: DC | PRN

## 2019-04-12 MED ORDER — HYDROMORPHONE HCL 1 MG/ML IJ SOLN
INTRAMUSCULAR | Status: AC
  Filled 2019-04-12: qty 1

## 2019-04-12 MED ORDER — MEPERIDINE HCL 50 MG/ML IJ SOLN: 6.2500 mg | INTRAMUSCULAR | Status: DC | PRN

## 2019-04-12 MED ORDER — MIDAZOLAM HCL 2 MG/2ML IJ SOLN
INTRAMUSCULAR | Status: DC | PRN
  Administered 2019-04-12: 2 mg via INTRAVENOUS

## 2019-04-12 MED ORDER — MIDAZOLAM HCL 2 MG/2ML IJ SOLN
INTRAMUSCULAR | Status: AC
  Filled 2019-04-12: qty 2

## 2019-04-12 MED ORDER — LISDEXAMFETAMINE DIMESYLATE 20 MG PO CAPS
20.0000 mg | ORAL_CAPSULE | Freq: Every day | ORAL | Status: DC
  Filled 2019-04-12: qty 1

## 2019-04-12 MED ORDER — ACETAMINOPHEN 10 MG/ML IV SOLN: 1000.0000 mg | Freq: Once | INTRAVENOUS | Status: DC | PRN

## 2019-04-12 MED ORDER — OXYCODONE HCL 5 MG/5ML PO SOLN: 5.0000 mg | Freq: Once | ORAL | Status: DC | PRN

## 2019-04-12 MED ORDER — PROMETHAZINE HCL 25 MG/ML IJ SOLN: 6.2500 mg | INTRAMUSCULAR | Status: DC | PRN

## 2019-04-12 SURGICAL SUPPLY — 30 items
ADH SKN CLS APL DERMABOND .7 (GAUZE/BANDAGES/DRESSINGS) ×1
APL PRP STRL LF DISP 70% ISPRP (MISCELLANEOUS) ×1
ATTRACTOMAT 16X20 MAGNETIC DRP (DRAPES) ×2 IMPLANT
BLADE SURG 15 STRL LF DISP TIS (BLADE) ×1 IMPLANT
BLADE SURG 15 STRL SS (BLADE) ×2
CHLORAPREP W/TINT 26 (MISCELLANEOUS) ×2 IMPLANT
CLIP VESOCCLUDE MED 6/CT (CLIP) ×5 IMPLANT
CLIP VESOCCLUDE SM WIDE 6/CT (CLIP) ×8 IMPLANT
COVER SURGICAL LIGHT HANDLE (MISCELLANEOUS) ×2 IMPLANT
COVER WAND RF STERILE (DRAPES) ×2 IMPLANT
DERMABOND ADVANCED (GAUZE/BANDAGES/DRESSINGS) ×1
DERMABOND ADVANCED .7 DNX12 (GAUZE/BANDAGES/DRESSINGS) ×1 IMPLANT
DRAPE LAPAROTOMY T 98X78 PEDS (DRAPES) ×2 IMPLANT
ELECT REM PT RETURN 15FT ADLT (MISCELLANEOUS) ×2 IMPLANT
GAUZE 4X4 16PLY RFD (DISPOSABLE) ×2 IMPLANT
GLOVE SURG ORTHO 8.0 STRL STRW (GLOVE) ×2 IMPLANT
GOWN STRL REUS W/TWL XL LVL3 (GOWN DISPOSABLE) ×4 IMPLANT
HEMOSTAT SURGICEL 2X4 FIBR (HEMOSTASIS) ×2 IMPLANT
ILLUMINATOR WAVEGUIDE N/F (MISCELLANEOUS) ×2 IMPLANT
KIT BASIN OR (CUSTOM PROCEDURE TRAY) ×2 IMPLANT
KIT TURNOVER KIT A (KITS) IMPLANT
PACK BASIC VI WITH GOWN DISP (CUSTOM PROCEDURE TRAY) ×2 IMPLANT
PENCIL SMOKE EVACUATOR (MISCELLANEOUS) IMPLANT
SHEARS HARMONIC 9CM CVD (BLADE) ×2 IMPLANT
SUT MNCRL AB 4-0 PS2 18 (SUTURE) ×2 IMPLANT
SUT VIC AB 3-0 SH 18 (SUTURE) ×4 IMPLANT
SYR BULB IRRIGATION 50ML (SYRINGE) ×2 IMPLANT
TOWEL OR 17X26 10 PK STRL BLUE (TOWEL DISPOSABLE) ×2 IMPLANT
TOWEL OR NON WOVEN STRL DISP B (DISPOSABLE) ×2 IMPLANT
TUBING CONNECTING 10 (TUBING) ×2 IMPLANT

## 2019-04-12 NOTE — Interval H&P Note (Signed)
History and Physical Interval Note:  04/12/2019 9:14 AM  Kayla Little  has presented today for surgery, with the diagnosis of medullary thyroid carcinoma.  The various methods of treatment have been discussed with the patient and family. After consideration of risks, benefits and other options for treatment, the patient has consented to    Procedure(s): TOTAL THYROIDECTOMY WITH LIMITED LYMPH NODE DISSECTION (N/A) as a surgical intervention.    The patient's history has been reviewed, patient examined, no change in status, stable for surgery.  I have reviewed the patient's chart and labs.  Questions were answered to the patient's satisfaction.    Armandina Gemma, MD [facility] Office: [place]

## 2019-04-12 NOTE — Anesthesia Postprocedure Evaluation (Signed)
Anesthesia Post Note  Patient: Kayla Little  Procedure(s) Performed: TOTAL THYROIDECTOMY WITH LIMITED LYMPH NODE DISSECTION (N/A Neck)     Patient location during evaluation: PACU Anesthesia Type: General Level of consciousness: awake and alert Pain management: pain level controlled Vital Signs Assessment: post-procedure vital signs reviewed and stable Respiratory status: spontaneous breathing, nonlabored ventilation, respiratory function stable and patient connected to nasal cannula oxygen Cardiovascular status: blood pressure returned to baseline and stable Postop Assessment: no apparent nausea or vomiting Anesthetic complications: no    Last Vitals:  Vitals:   04/12/19 1300 04/12/19 1400  BP: 127/85 108/79  Pulse: 83 95  Resp: 12 17  Temp: 36.8 C   SpO2: 92% 94%    Last Pain:  Vitals:   04/12/19 1400  TempSrc:   PainSc: 2                  [name]

## 2019-04-12 NOTE — Progress Notes (Signed)
Patient arrives to room 1501 from PACU via stretcher at this time.  Patient independent from stretcher to bathroom to bed.  Siderails are up x2 with bed low and callbell in reach.

## 2019-04-12 NOTE — Op Note (Signed)
Procedure Note  Pre-operative Diagnosis:  MEN-2a, rule out medullary thyroid carcinoma  Post-operative Diagnosis:  same  Surgeon:  Armandina Gemma, MD  Assistant:  none   Procedure:  Total thyroidectomy with limited central compartment lymph node dissection  Anesthesia:  General  Estimated Blood Loss:  minimal  Drains: none         Specimen: thyroid to pathology  Indications:  Patient is referred by Dr. Dagmar Hait for surgical evaluation and management of suspected medullary thyroid carcinoma arising in a patient with genetic mutation consistent with MEN-2a. Patient had been evaluated by her gynecologist, Dr. Bertis Ruddy, due to her mother's history of triple negative breast carcinoma. Dr. Ronita Hipps had the patient undergo genetic testing following childbirth last year. 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 Dagmar Hait. I do not have complete records at this time. We will obtain those from the office of the endocrinologist. However, the patient does give a history of having been tested for pheochromocytoma with normal catecholamine levels. Unfortunately, the patient was found to have an elevated calcitonin level. She underwent an ultrasound examination on February 07, 2019. This showed a normal size thyroid gland. The largest nodule was in the mid left thyroid lobe measuring 8 mm in size. There were additional bilateral scattered hypoechoic nodules all less than 5 mm in size. These findings were worrisome for possible medullary thyroid carcinoma. Patient is now referred to surgery for consideration for total thyroidectomy for definitive diagnosis.   Procedure Details: Procedure was done in OR #1 at the [facility]. The patient was brought to the operating room and placed in a supine position on the operating room table. Following administration of general anesthesia, the patient was positioned and then prepped and draped in the usual  aseptic fashion. After ascertaining that an adequate level of anesthesia had been achieved, a small Kocher incision was made with #15 blade. Dissection was carried through subcutaneous tissues and platysma.Hemostasis was achieved with the electrocautery. Skin flaps were elevated cephalad and caudad from the thyroid notch to the sternal notch. A Mahorner self-retaining retractor was placed for exposure. Strap muscles were incised in the midline and dissection was begun on the left side.  Strap muscles were reflected laterally.  Left thyroid lobe was normal in size.  The left lobe was gently mobilized with blunt dissection. Superior pole vessels were dissected out and divided individually between small and medium ligaclips with the harmonic scalpel. The thyroid lobe was rolled anteriorly. Branches of the inferior thyroid artery were divided between small ligaclips with the harmonic scalpel. Inferior venous tributaries were divided between ligaclips. Both the superior and inferior parathyroid glands were identified and preserved on their vascular pedicles. The recurrent laryngeal nerve was identified and preserved along its course. The ligament of Gwenlyn Found was released with the electrocautery and the gland was mobilized onto the anterior trachea. Isthmus was mobilized across the midline. There was a small thin pyramidal lobe present which was resected with the isthmus. Dry pack was placed in the left neck.  The right thyroid lobe was gently mobilized with blunt dissection. Right thyroid lobe was normal in size. Superior pole vessels were dissected out and divided between small and medium ligaclips with the Harmonic scalpel. Superior parathyroid was identified and preserved. Inferior venous tributaries were divided between medium ligaclips with the harmonic scalpel. The right thyroid lobe was rolled anteriorly and the branches of the inferior thyroid artery divided between small ligaclips. The  right recurrent laryngeal  nerve was identified and preserved along its course. The ligament of Gwenlyn Found was released with the electrocautery. The right thyroid lobe was mobilized onto the anterior trachea and the remainder of the thyroid was dissected off the anterior trachea and the thyroid was completely excised. A single suture was used to mark the left lobe and a double suture was used to mark the right lobe. The entire thyroid gland was submitted to pathology for review.  A sample of tissue from the central compartment was dissected out using electrocautery and ligaclips for hemostasis.  Dissection was carried from the right carotid artery anterior to the trachea and extending into the superior mediastinum.  Dissection was carried over to the left carotid artery.  Care was taken to avoid the parathyroid glands and the recurrent laryngeal nerves.  Tissue was submitted to pathology labeled as central compartment lymph nodes, zone IV.  The neck was irrigated with warm saline. Fibrillar was placed throughout the operative field. Strap muscles were approximated in the midline with interrupted 3-0 Vicryl sutures. Platysma was closed with interrupted 3-0 Vicryl sutures. Skin was closed with a running 4-0 Monocryl subcuticular suture. Wound was washed and Dermabond was applied. The patient was awakened from anesthesia and brought to the recovery room. The patient tolerated the procedure well.   Armandina Gemma, MD [facility] Office: [phone]

## 2019-04-12 NOTE — Transfer of Care (Signed)
Immediate Anesthesia Transfer of Care Note  Patient: [name]  Procedure(s) Performed: TOTAL THYROIDECTOMY WITH LIMITED LYMPH NODE DISSECTION (N/A Neck)  Patient Location: PACU  Anesthesia Type:General  Level of Consciousness: awake and oriented  Airway & Oxygen Therapy: Patient Spontanous Breathing and Patient connected to face mask oxygen  Post-op Assessment: Report given to RN and Post -op Vital signs reviewed and stable  Post vital signs: Reviewed and stable  Last Vitals:  Vitals Value Taken Time  BP 145/94 04/12/19 1135  Temp    Pulse 99 04/12/19 1136  Resp 8 04/12/19 1136  SpO2 100 % 04/12/19 1136  Vitals shown include unvalidated device data.  Last Pain:  Vitals:   04/12/19 0803  TempSrc:   PainSc: 0-No pain         Complications: No apparent anesthesia complications

## 2019-04-12 NOTE — Anesthesia Procedure Notes (Signed)
Procedure Name: Intubation Date/Time: 04/12/2019 9:40 AM Performed by: Sharlette Dense, CRNA Patient Re-evaluated:Patient Re-evaluated prior to induction Oxygen Delivery Method: Circle system utilized Preoxygenation: Pre-oxygenation with 100% oxygen Induction Type: IV induction Ventilation: Mask ventilation without difficulty and Oral airway inserted - appropriate to patient size Laryngoscope Size: Sabra Heck and 2 Grade View: Grade I Tube type: Reinforced Tube size: 7.0 mm Number of attempts: 1 Airway Equipment and Method: Stylet Placement Confirmation: ETT inserted through vocal cords under direct vision,  positive ETCO2 and breath sounds checked- equal and bilateral Secured at: 21 cm Tube secured with: Tape Dental Injury: Teeth and Oropharynx as per pre-operative assessment

## 2019-04-12 NOTE — Anesthesia Preprocedure Evaluation (Addendum)
Anesthesia Evaluation  Patient identified by MRN, date of birth, ID band Patient awake    Reviewed: Allergy & Precautions, NPO status , Patient's Chart, lab work & pertinent test results  Airway Mallampati: I  TM Distance: >3 FB Neck ROM: Full    Dental no notable dental hx.    Pulmonary Current Smoker and Patient abstained from smoking.,    Pulmonary exam normal        Cardiovascular negative cardio ROS Normal cardiovascular exam     Neuro/Psych    GI/Hepatic negative GI ROS, Neg liver ROS,   Endo/Other  negative endocrine ROS  Renal/GU      Musculoskeletal negative musculoskeletal ROS (+)   Abdominal Normal abdominal exam  (+)   Peds  Hematology negative hematology ROS (+)   Anesthesia Other Findings   Reproductive/Obstetrics                            Anesthesia Physical Anesthesia Plan  ASA: II  Anesthesia Plan: General   Post-op Pain Management:    Induction: Intravenous  PONV Risk Score and Plan: 3 and Ondansetron, Dexamethasone and Midazolam  Airway Management Planned: Oral ETT  Additional Equipment: None  Intra-op Plan:   Post-operative Plan: Extubation in OR  Informed Consent: I have reviewed the patients History and Physical, chart, labs and discussed the procedure including the risks, benefits and alternatives for the proposed anesthesia with the patient or authorized representative who has indicated his/her understanding and acceptance.     Dental advisory given  Plan Discussed with: CRNA  Anesthesia Plan Comments:         Anesthesia Quick Evaluation

## 2019-04-13 DIAGNOSIS — C73 Malignant neoplasm of thyroid gland: Secondary | ICD-10-CM | POA: Diagnosis not present

## 2019-04-13 LAB — BASIC METABOLIC PANEL
Anion gap: 9 (ref 5–15)
BUN: 8 mg/dL (ref 6–20)
CO2: 22 mmol/L (ref 22–32)
Calcium: 8.6 mg/dL — ABNORMAL LOW (ref 8.9–10.3)
Chloride: 103 mmol/L (ref 98–111)
Creatinine, Ser: 0.76 mg/dL (ref 0.44–1.00)
GFR calc Af Amer: >60 mL/min (ref >60)
GFR calc non Af Amer: >60 mL/min (ref >60)
Glucose, Bld: 125 mg/dL — ABNORMAL HIGH (ref 70–99)
Potassium: 3.8 mmol/L (ref 3.5–5.1)
Sodium: 134 mmol/L — ABNORMAL LOW (ref 135–145)

## 2019-04-13 MED ORDER — CALCIUM CARBONATE ANTACID 500 MG PO CHEW: 2.0000 | CHEWABLE_TABLET | Freq: Two times a day (BID) | ORAL | 1 refills | Status: DC

## 2019-04-13 MED ORDER — LEVOTHYROXINE SODIUM 100 MCG PO TABS: 100.0000 ug | ORAL_TABLET | Freq: Every day | ORAL | 3 refills | Status: DC

## 2019-04-13 MED ORDER — OXYCODONE HCL 5 MG PO TABS: 5.0000 mg | ORAL_TABLET | ORAL | 0 refills | Status: DC | PRN

## 2019-04-13 NOTE — Discharge Summary (Signed)
Physician Discharge Summary [facility]  Patient ID: Kayla Little MRN: [id number] DOB/AGE: Nov 22, 1985 34 y.o.  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  Post op course was uncomplicated.  Pain was well controlled.  Tolerated diet.  Post op calcium level on morning following surgery was 8.6 mg/dl.  Patient was prepared for discharge home on POD#1.  Consults: None  Treatments: surgery: total thyroidectomy with limited lymph node dissection  Discharge Exam: Blood pressure 110/82, pulse 70, temperature 98.2 F (36.8 C), resp. rate 17, height 5\' 6"  (1.676 m), weight 73.4 kg, SpO2 97 %, not currently breastfeeding. HEENT - clear Neck - wound dry and intact; mild STS; voice normal; Dermabond in place Chest - clear bilaterally Cor - RRR   Disposition: Home  Discharge Instructions    Diet - low sodium heart healthy   Complete by: As directed    Discharge instructions   Complete by: As directed    [facility]  THYROID & PARATHYROID SURGERY:  POST-OP INSTRUCTIONS  Always review your discharge instruction sheet from the facility where your surgery was performed.  A prescription for pain medication may be given to you upon discharge.  Take your pain medication as prescribed.  If narcotic pain medicine is not needed, then you may take acetaminophen (Tylenol) or ibuprofen (Advil) as needed.  Take your usually prescribed medications unless otherwise directed.  If you need a refill on your pain medication, please contact our office during regular business  hours.  Prescriptions cannot be processed by our office after 5 pm or on weekends.  Start with a light diet upon arrival home, such as soup and crackers or toast.  Be sure to drink plenty of fluids daily.  Resume your normal diet the day after surgery.  Most patients will experience some swelling and bruising on the chest and neck area.  Ice packs will help.  Swelling and bruising can take several days to resolve.   It is common to experience some constipation after surgery.  Increasing fluid intake and taking a stool softener (Colace) will usually help or prevent this problem.  A mild laxative (Milk of Magnesia or Miralax) should be taken according to package directions if there has been no bowel movement after 48 hours.  You have steri-strips and a gauze dressing over your incision.  You may remove the gauze bandage on the second day after surgery, and you may shower at that time.  Leave your steri-strips (small skin tapes) in place directly over the incision.  These strips should remain on the skin for 5-7 days and then be removed.  You may get them wet in the shower and pat them dry.  You may resume regular (light) daily activities beginning the next day (such as daily self-care, walking, climbing stairs) gradually increasing activities as tolerated.  You may have sexual intercourse when it is comfortable.  Refrain from any heavy lifting or straining until approved by your doctor.  You may drive when you no longer are taking prescription pain medication, you can comfortably wear a seatbelt, and you can safely maneuver your car and apply brakes.  You should see your doctor in the office for a follow-up appointment approximately three weeks after your surgery.  Make sure that you call for this appointment within a day or two after you arrive home to insure a convenient appointment time.  WHEN TO CALL YOUR DOCTOR: -- Fever greater than 101.5 -- Inability to urinate -- Nausea and/or vomiting -  persistent -- Extreme swelling or bruising -- Continued bleeding from incision -- Increased pain, redness, or drainage from the incision -- Difficulty swallowing or breathing -- Muscle cramping or spasms -- Numbness or tingling in hands or around lips  The clinic staff is available to answer your questions during regular business hours.  Please don't hesitate to call and ask to speak to one of the nurses if you have concerns.  Armandina Gemma, MD [facility] Office: [phone]   Ice pack   Complete by: As directed    Increase activity slowly   Complete by: As directed    No dressing needed   Complete by: As directed      Allergies as of 04/13/2019   No Known Allergies     Medication List    TAKE these medications   acetaminophen 325 MG tablet Commonly known as: Tylenol Take 2 tablets (650 mg total) by mouth every 4 (four) hours as needed (for pain scale < 4).   calcium carbonate 500 MG chewable tablet Commonly known as: Tums Chew 2 tablets (400 mg of elemental calcium total) by mouth 2 (two) times daily.   ibuprofen 600 MG tablet Commonly known as: ADVIL Take 1 tablet (600 mg total) by mouth every 6 (six) hours.   levonorgestrel 20 MCG/24HR IUD Commonly known as: MIRENA 1 each by Intrauterine route once.   levothyroxine 100 MCG tablet Commonly known as: Synthroid Take 1 tablet (100 mcg total) by mouth daily.   oxyCODONE 5 MG immediate release tablet Commonly known as: Oxy IR/ROXICODONE Take 1-2 tablets (5-10 mg total) by mouth every 4 (four) hours as needed for moderate pain.   sertraline 50 MG tablet Commonly known as: ZOLOFT Take 50 mg by mouth at bedtime.   Vyvanse 20 MG capsule Generic drug: lisdexamfetamine Take 20 mg by mouth daily.        Earnstine Regal, MD, [facility] Office: [phone]   Signed: Armandina Gemma 04/13/2019, 9:21 AM

## 2019-04-13 NOTE — Plan of Care (Signed)
Pt is discharged to home.

## 2019-04-15 ENCOUNTER — Emergency Department (HOSPITAL_COMMUNITY)
Admission: EM | Admit: 2019-04-15 | Discharge: 2019-04-16 | Disposition: A | Discharge status: Home / Self Care | Payer: 59 | Attending: Emergency Medicine | Admitting: Emergency Medicine

## 2019-04-15 ENCOUNTER — Other Ambulatory Visit: Payer: Self-pay

## 2019-04-15 ENCOUNTER — Encounter (HOSPITAL_COMMUNITY): Payer: Self-pay | Admitting: Emergency Medicine

## 2019-04-15 DIAGNOSIS — F17210 Nicotine dependence, cigarettes, uncomplicated: Secondary | ICD-10-CM | POA: Diagnosis not present

## 2019-04-15 DIAGNOSIS — R253 Fasciculation: Secondary | ICD-10-CM | POA: Diagnosis present

## 2019-04-15 DIAGNOSIS — Z79899 Other long term (current) drug therapy: Secondary | ICD-10-CM | POA: Insufficient documentation

## 2019-04-15 DIAGNOSIS — Z85850 Personal history of malignant neoplasm of thyroid: Secondary | ICD-10-CM | POA: Diagnosis not present

## 2019-04-15 DIAGNOSIS — R202 Paresthesia of skin: Secondary | ICD-10-CM | POA: Diagnosis not present

## 2019-04-15 LAB — CBC WITH DIFFERENTIAL/PLATELET
Abs Immature Granulocytes: 0.02 10*3/uL (ref 0.00–0.07)
Basophils Absolute: 0.1 10*3/uL (ref 0.0–0.1)
Basophils Relative: 1 %
Eosinophils Absolute: 0.2 10*3/uL (ref 0.0–0.5)
Eosinophils Relative: 2 %
HCT: 35.8 % — ABNORMAL LOW (ref 36.0–46.0)
Hemoglobin: 11.9 g/dL — ABNORMAL LOW (ref 12.0–15.0)
Immature Granulocytes: 0 %
Lymphocytes Relative: 41 %
Lymphs Abs: 2.9 10*3/uL (ref 0.7–4.0)
MCH: 34.1 pg — ABNORMAL HIGH (ref 26.0–34.0)
MCHC: 33.2 g/dL (ref 30.0–36.0)
MCV: 102.6 fL — ABNORMAL HIGH (ref 80.0–100.0)
Monocytes Absolute: 0.5 10*3/uL (ref 0.1–1.0)
Monocytes Relative: 7 %
Neutro Abs: 3.6 10*3/uL (ref 1.7–7.7)
Neutrophils Relative %: 49 %
Platelets: 253 10*3/uL (ref 150–400)
RBC: 3.49 MIL/uL — ABNORMAL LOW (ref 3.87–5.11)
RDW: 12.6 % (ref 11.5–15.5)
WBC: 7.2 10*3/uL (ref 4.0–10.5)
nRBC: 0 % (ref 0.0–0.2)

## 2019-04-15 LAB — BASIC METABOLIC PANEL
Anion gap: 11 (ref 5–15)
BUN: 9 mg/dL (ref 6–20)
CO2: 25 mmol/L (ref 22–32)
Calcium: 6.8 mg/dL — ABNORMAL LOW (ref 8.9–10.3)
Chloride: 101 mmol/L (ref 98–111)
Creatinine, Ser: 0.78 mg/dL (ref 0.44–1.00)
GFR calc Af Amer: >60 mL/min (ref >60)
GFR calc non Af Amer: >60 mL/min (ref >60)
Glucose, Bld: 98 mg/dL (ref 70–99)
Potassium: 3.6 mmol/L (ref 3.5–5.1)
Sodium: 137 mmol/L (ref 135–145)

## 2019-04-15 LAB — I-STAT CHEM 8, ED
BUN: 8 mg/dL (ref 6–20)
Calcium, Ion: 0.91 mmol/L — ABNORMAL LOW (ref 1.15–1.40)
Chloride: 101 mmol/L (ref 98–111)
Creatinine, Ser: 0.8 mg/dL (ref 0.44–1.00)
Glucose, Bld: 94 mg/dL (ref 70–99)
HCT: 37 % (ref 36.0–46.0)
Hemoglobin: 12.6 g/dL (ref 12.0–15.0)
Potassium: 3.7 mmol/L (ref 3.5–5.1)
Sodium: 139 mmol/L (ref 135–145)
TCO2: 26 mmol/L (ref 22–32)

## 2019-04-15 MED ORDER — SODIUM CHLORIDE 0.9 % IV SOLN: 1.0000 g | Freq: Once | INTRAVENOUS | Status: DC

## 2019-04-15 MED ORDER — ACETAMINOPHEN 325 MG PO TABS
325.0000 mg | ORAL_TABLET | ORAL | Status: DC | PRN
  Administered 2019-04-15: 325 mg via ORAL
  Filled 2019-04-15: qty 1

## 2019-04-15 MED ORDER — CALCIUM GLUCONATE-NACL 1-0.675 GM/50ML-% IV SOLN
1.0000 g | Freq: Once | INTRAVENOUS | Status: AC
  Administered 2019-04-15: 1000 mg via INTRAVENOUS
  Filled 2019-04-15: qty 50

## 2019-04-15 MED ORDER — OXYCODONE-ACETAMINOPHEN 5-325 MG PO TABS
1.0000 | ORAL_TABLET | ORAL | Status: DC | PRN
  Administered 2019-04-15: 1 via ORAL
  Filled 2019-04-15: qty 1

## 2019-04-15 NOTE — ED Provider Notes (Signed)
[place] DEPT Provider Note   CSN: [id number] Arrival date & time: 04/15/19  2047     History Chief Complaint  Patient presents with  . Tingling  . facial twitching    Kayla Little is a 34 y.o. female.  She had thyroid surgery 4 days ago.  Today she noticed that she felt less well and noticed some pins-and-needles and twitching in her hands and face.  Lips felt numb.  Felt lightheaded.  No fevers or chills minimal cough no vomiting or diarrhea.  Actually she said she has been a little constipated since leaving the hospital.  Using oxycodone for pain.  She called on-call surgery who recommended she come here and get calcium level checked.  She has been using oral calcium twice a day but took 3 times today  The history is provided by the patient.  Illness Location:  Hands and face Quality:  Tingling and twitchy Severity:  Moderate Onset quality:  Gradual Timing:  Intermittent Progression:  Unchanged Chronicity:  New Context:  Thryoid surg Relieved by:  Nothing Worsened by:  Nothing Ineffective treatments:  Calcium Associated symptoms: cough   Associated symptoms: no abdominal pain, no chest pain, no diarrhea, no fever, no headaches, no loss of consciousness, no shortness of breath, no sore throat and no vomiting        Past Medical History:  Diagnosis Date  . ADD (attention deficit disorder)   . Breast lump    R breast  . Depression   . Family history of breast cancer   . Family history of lymphoma   . Family history of ovarian cancer   . Family history of prostate cancer   . 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 Hx of varicella   . Kidney stone 2012  . Medullary thyroid carcinoma ([place])   . Monoallelic mutation of CHEK2 gene in female patient   . Postpartum care following vaginal delivery (7/27) 10/02/2014, June 16 , 2020    Patient Active Problem List   Diagnosis Date Noted  . MEN 2A (multiple endocrine  neoplasia, type 2A) ([place]) 04/12/2019  . Multiple thyroid nodules 04/08/2019  . Family history of breast cancer   . Family history of prostate cancer   . Family history of lymphoma   . Family history of ovarian cancer   . Monoallelic mutation of CHEK2 gene in female patient   . 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 SVD (spontaneous vaginal delivery) 08/23/2018  . Postpartum care following vaginal delivery (6/16) 08/23/2018  . Second degree perineal laceration 08/23/2018  . Encounter for planned induction of labor 08/22/2018  . Type O blood, Rh negative 10/08/2014    Past Surgical History:  Procedure Laterality Date  . NO PAST SURGERIES    . THYROIDECTOMY N/A 04/12/2019   Procedure: TOTAL THYROIDECTOMY WITH LIMITED LYMPH NODE DISSECTION;  Surgeon: Armandina Gemma, MD;  Location: [facility];  Service: General;  Laterality: N/A;  . UNILATERAL SALPINGECTOMY Right 07/09/2017   Procedure: RIGHT SALPINGECTOMY WITH REMOVAL OF ECTOPIC PREGNANCY;  Surgeon: Azucena Fallen, MD;  Location: [facility];  Service: Gynecology;  Laterality: Right;     OB History    Gravida  3   Para  2   Term  2   Preterm      AB  1   Living  2     SAB      TAB  Ectopic  1   Multiple  0   Live Births  2           Family History  Problem Relation Age of Onset  . Breast cancer Mother 56       2 types of cancer one side mastectomy with chemo  . Endometriosis Mother   . Endometriosis Maternal Grandmother   . Breast cancer Maternal Grandmother        post menopausal  . Diabetes Maternal Grandmother   . Cancer Maternal Grandfather        prostate and lymphoma  . Alzheimer's disease Paternal Grandmother   . Cancer Father 89       prostate cancer  . Aneurysm Paternal Grandfather        d. 21s    Social History   Tobacco Use  . Smoking status: Current Some Day Smoker    Packs/day: 0.50    Years: 5.00    Pack years: 2.50    Types: Cigarettes  . Smokeless tobacco:  Never Used  Substance Use Topics  . Alcohol use: Yes    Alcohol/week: 7.0 - 14.0 standard drinks    Types: 7 - 14 Glasses of wine per week    Comment: glass of wine each night   . Drug use: Yes    Types: Marijuana    Comment: not during pregnancy, last use was 03/27/2019    Home Medications Prior to Admission medications   Medication Sig Start Date End Date Taking? Authorizing Provider  acetaminophen (TYLENOL) 325 MG tablet Take 2 tablets (650 mg total) by mouth every 4 (four) hours as needed (for pain scale < 4). Patient not taking: Reported on 04/02/2019 08/23/18   Darliss Cheney, CNM  calcium carbonate (TUMS) 500 MG chewable tablet Chew 2 tablets (400 mg of elemental calcium total) by mouth 2 (two) times daily. 04/13/19   Armandina Gemma, MD  ibuprofen (ADVIL) 600 MG tablet Take 1 tablet (600 mg total) by mouth every 6 (six) hours. Patient not taking: Reported on 04/02/2019 08/23/18   Darliss Cheney, CNM  levonorgestrel (MIRENA) 20 MCG/24HR IUD 1 each by Intrauterine route once.    [provider]  levothyroxine (SYNTHROID) 100 MCG tablet Take 1 tablet (100 mcg total) by mouth daily. 04/13/19 04/12/20  Armandina Gemma, MD  oxyCODONE (OXY IR/ROXICODONE) 5 MG immediate release tablet Take 1-2 tablets (5-10 mg total) by mouth every 4 (four) hours as needed for moderate pain. 04/13/19   Armandina Gemma, MD  sertraline (ZOLOFT) 50 MG tablet Take 50 mg by mouth at bedtime.  01/26/17   [provider]  VYVANSE 20 MG capsule Take 20 mg by mouth daily. 03/28/19   [provider]    Allergies    Patient has no known allergies.  Review of Systems   Review of Systems  Constitutional: Negative for fever.  HENT: Negative for sore throat.   Eyes: Negative for visual disturbance.  Respiratory: Positive for cough. Negative for shortness of breath.   Cardiovascular: Negative for chest pain.  Gastrointestinal: Negative for abdominal pain, diarrhea and vomiting.  Genitourinary:  Negative for dysuria.  Musculoskeletal: Positive for neck pain.  Skin: Positive for wound.  Neurological: Negative for loss of consciousness and headaches.    Physical Exam Updated Vital Signs BP 131/88 (BP Location: Left Arm)   Pulse 60   Temp 98 F (36.7 C) (Oral)   Resp 15   Ht _0  (1.676 m)   Wt 72.6 kg  SpO2 98%   BMI 25.82 kg/m   Physical Exam Vitals and nursing note reviewed.  Constitutional:      General: She is not in acute distress.    Appearance: She is well-developed.  HENT:     Head: Normocephalic and atraumatic.  Eyes:     Conjunctiva/sclera: Conjunctivae normal.  Neck:     Comments: She is get a healing anterior neck incision with no overlying erythema. Cardiovascular:     Rate and Rhythm: Normal rate and regular rhythm.     Heart sounds: No murmur.  Pulmonary:     Effort: Pulmonary effort is normal. No respiratory distress.     Breath sounds: Normal breath sounds.  Abdominal:     Palpations: Abdomen is soft.     Tenderness: There is no abdominal tenderness.  Musculoskeletal:        General: No deformity or signs of injury. Normal range of motion.  Skin:    General: Skin is warm and dry.     Capillary Refill: Capillary refill takes less than 2 seconds.  Neurological:     General: No focal deficit present.     Mental Status: She is alert.     ED Results / Procedures / Treatments   Labs (all labs ordered are listed, but only abnormal results are displayed) Labs Reviewed  CBC WITH DIFFERENTIAL/PLATELET - Abnormal; Notable for the following components:      Result Value   RBC 3.49 (*)    Hemoglobin 11.9 (*)    HCT 35.8 (*)    MCV 102.6 (*)    MCH 34.1 (*)    All other components within normal limits  BASIC METABOLIC PANEL - Abnormal; Notable for the following components:   Calcium 6.8 (*)    All other components within normal limits  I-STAT CHEM 8, ED - Abnormal; Notable for the following components:   Calcium, Ion 0.91 (*)    All other  components within normal limits    EKG EKG Interpretation  Date/Time:  Sunday April 15 2019 21:42:17 EST Ventricular Rate:  57 PR Interval:    QRS Duration: 106 QT Interval:  452 QTC Calculation: 441 R Axis:   50 Text Interpretation: Sinus rhythm Low voltage, precordial leads No old tracing to compare Confirmed by Aletta Edouard [phone]) on 04/15/2019 9:50:30 PM Also confirmed by Aletta Edouard [phone]), editor Hattie Perch ([id number])  on 04/16/2019 7:46:09 AM   Radiology No results found.  Procedures Procedures (including critical care time)  Medications Ordered in ED Medications  calcium gluconate 1 g/ 50 mL sodium chloride IVPB (0 g Intravenous Stopped 04/15/19 2250)  calcium gluconate 1 g/ 50 mL sodium chloride IVPB (0 g Intravenous Stopped 04/16/19 0023)    ED Course  I have reviewed the triage vital signs and the nursing notes.  Pertinent labs & imaging results that were available during my care of the patient were reviewed by me and considered in my medical decision making (see chart for details).  Clinical Course as of Apr 15 825  Nancy Fetter Apr 15, 2019  2209 Differential diagnosis includes hypocalcemia, metabolic derangement, anemia, hypoxia.  Sats 98% on room air.  Hemoglobin came back lower than her preop but she is also been in this range before.  Calcium ionized is low at 0.91.  Have ordered her IV calcium.  Discussed with Dr. Marlou Starks from general surgery who agrees with plan.   [MB]  2259 Patient's received a gram of IV calcium and she says her tingling  is resolved.   [MB]  2332 We will give her a second gram of calcium and anticipate that she would be fine for discharge.  Signed out to Dr. Leonette Monarch for reassess after infusion.    [MB]    Clinical Course User Index [MB] Hayden Rasmussen, MD   MDM Rules/Calculators/A&P                       Final Clinical Impression(s) / ED Diagnoses Final diagnoses:  Hypocalcemia  Paresthesia    Rx / DC Orders ED Discharge  Orders    None       Hayden Rasmussen, MD 04/16/19 [phone]

## 2019-04-15 NOTE — Discharge Instructions (Signed)
You were seen in the emergency department for evaluation of tingling and twitching after having had thyroid surgery.  Your calcium level was low and your symptoms improved with getting some IV calcium.  It will be important for you to continue your oral calcium and contact your surgeon tomorrow for close follow-up.  Return to the emergency department if any worsening or concerning symptoms.

## 2019-04-15 NOTE — ED Triage Notes (Signed)
Patient here from home with complaints of facial twitching and tingling in hands. Reports recent tyroid surgery on 2/4. Called surgeon, recommended calcium check.

## 2019-04-17 LAB — SURGICAL PATHOLOGY

## 2019-06-18 ENCOUNTER — Other Ambulatory Visit: Payer: Self-pay | Admitting: Oncology

## 2019-11-13 ENCOUNTER — Other Ambulatory Visit: Payer: Self-pay | Admitting: Internal Medicine

## 2019-11-13 DIAGNOSIS — C73 Malignant neoplasm of thyroid gland: Secondary | ICD-10-CM

## 2019-11-19 ENCOUNTER — Ambulatory Visit
Admission: RE | Admit: 2019-11-19 | Discharge: 2019-11-19 | Disposition: A | Discharge status: Home / Self Care | Payer: 59 | Source: Ambulatory Visit | Attending: Internal Medicine | Admitting: Internal Medicine

## 2019-11-19 DIAGNOSIS — C73 Malignant neoplasm of thyroid gland: Secondary | ICD-10-CM

## 2019-11-19 IMAGING — US US THYROID
1 series · 14 of 19 positions shown · non-contrast
Comparison: [DATE]

CLINICAL DATA: Other. History of medullary thyroid cancer, post
total thyroidectomy.

EXAM:
THYROID ULTRASOUND
TECHNIQUE: Ultrasound examination of the thyroid gland and adjacent soft
tissues was performed.

[Series 1: us thyroid · 0.05mm/px · 14 of 19 slices shown]
[im 1/19]
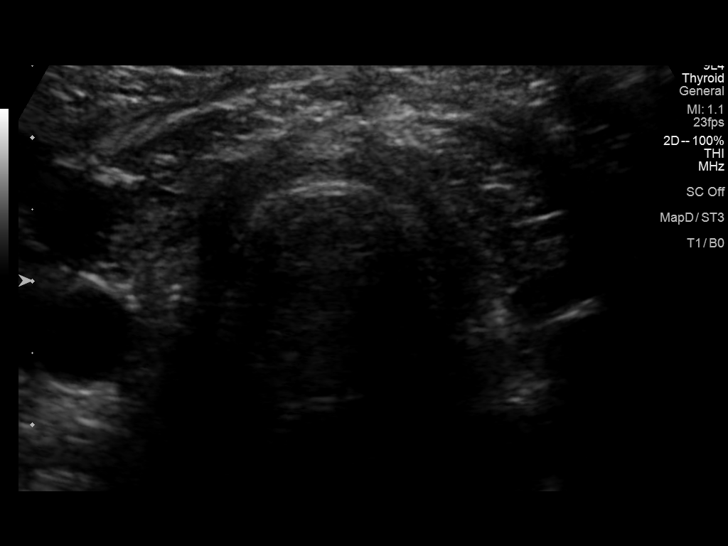
[im 3/19]
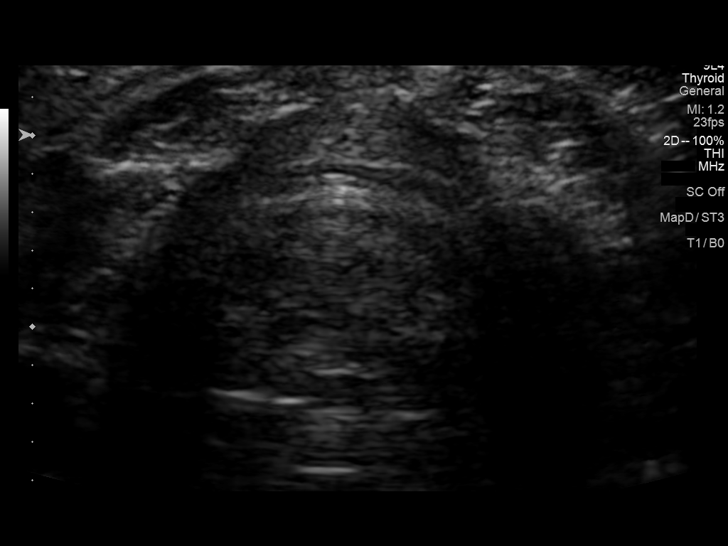
[im 4/19]
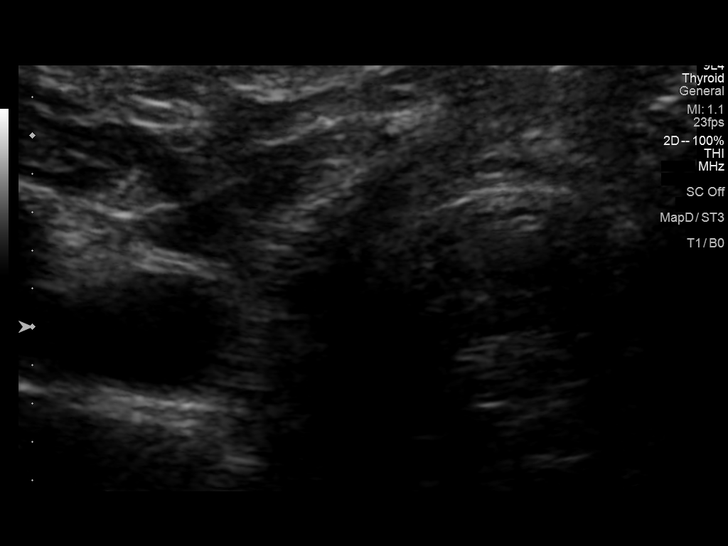
[im 5/19]
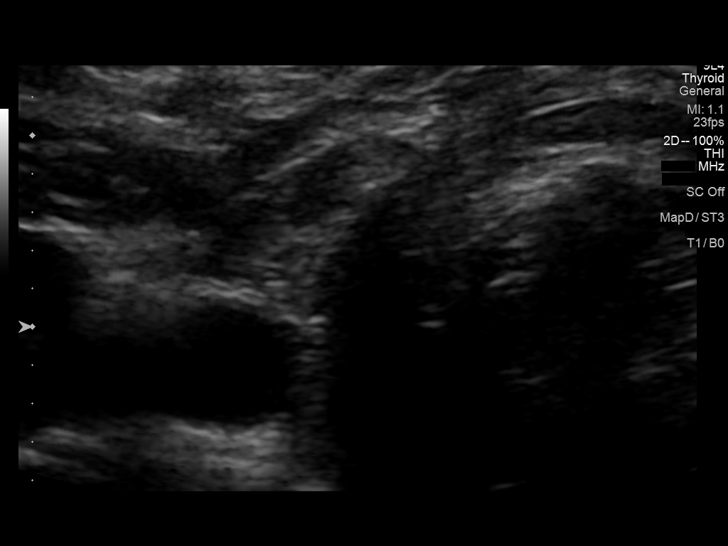
[im 7/19]
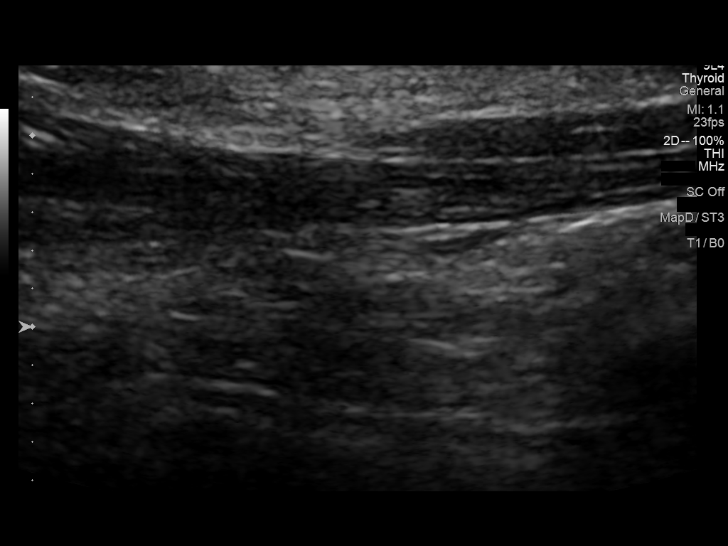
[im 8/19]
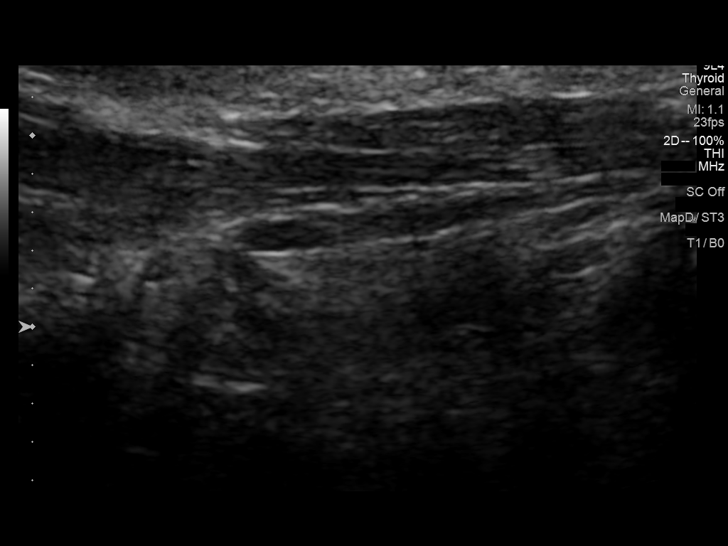
[im 9/19]
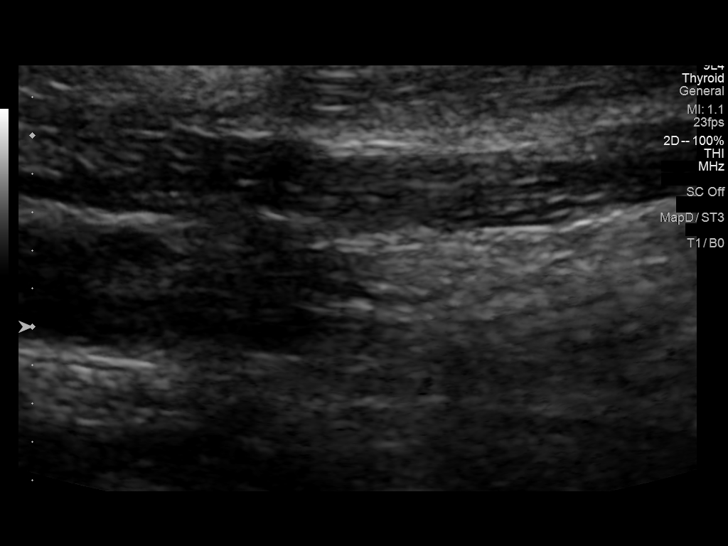
[im 11/19]
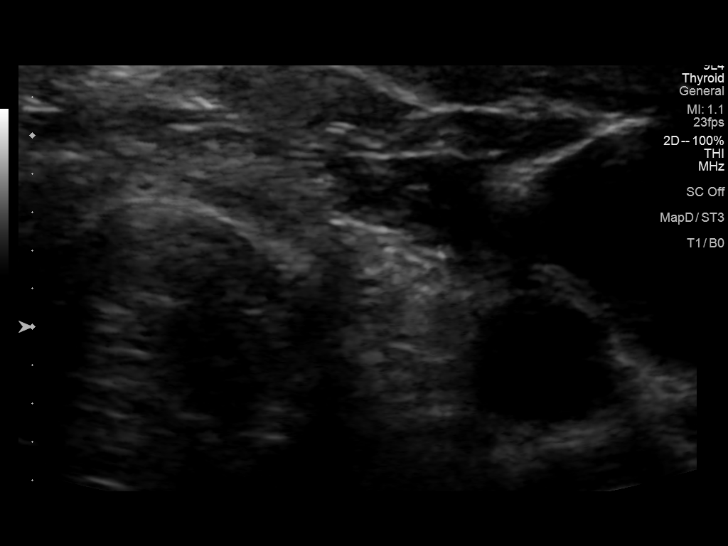
[im 12/19]
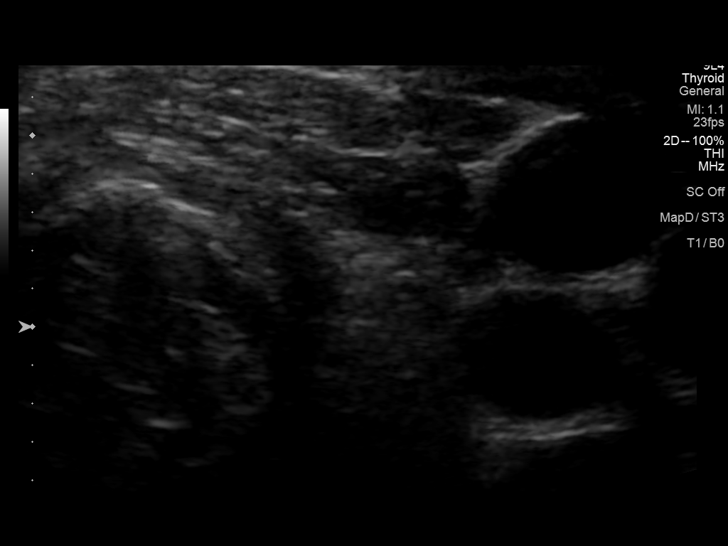
[im 13/19]
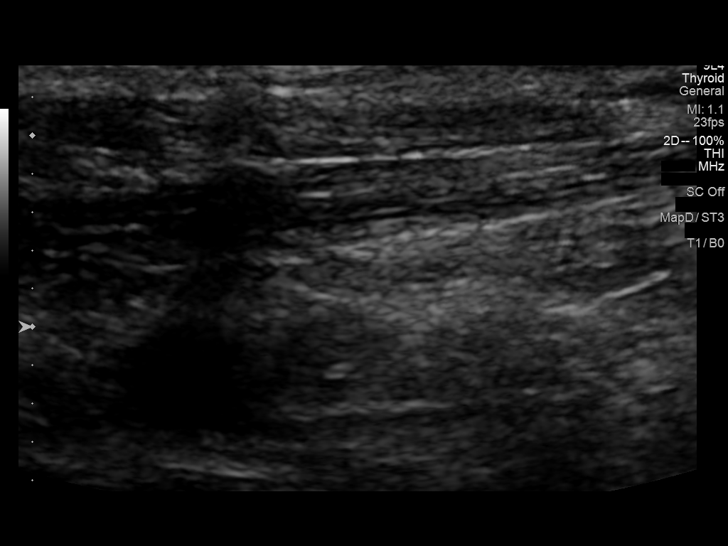
[im 15/19]
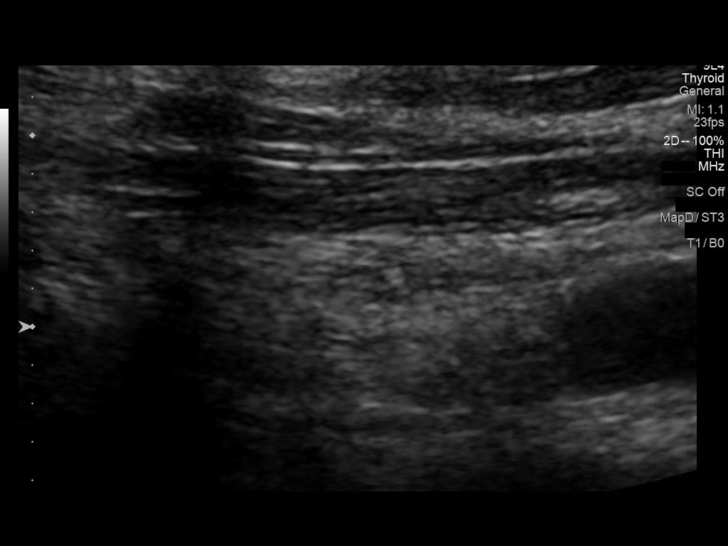
[im 16/19]
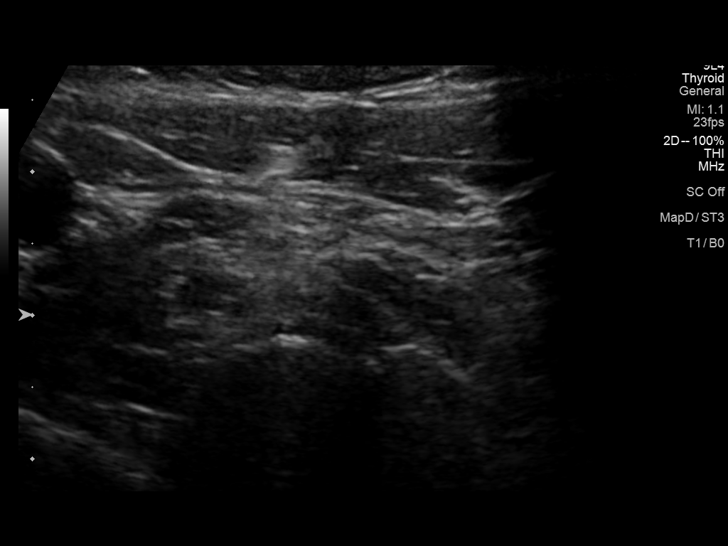
[im 17/19]
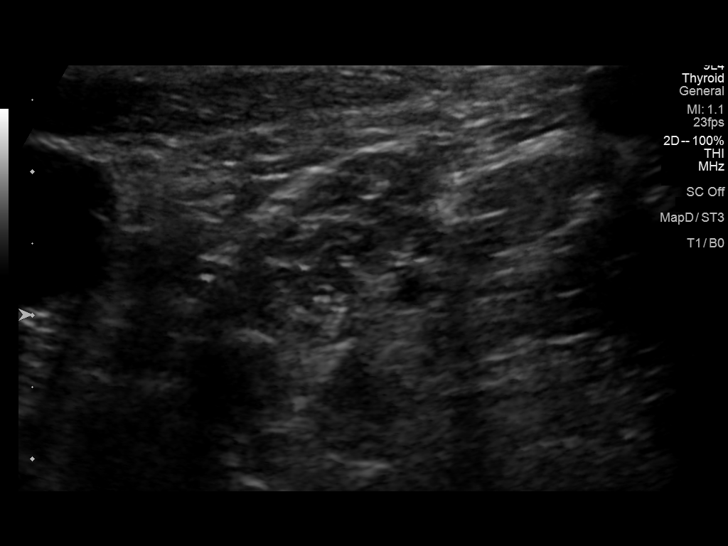
[im 19/19]
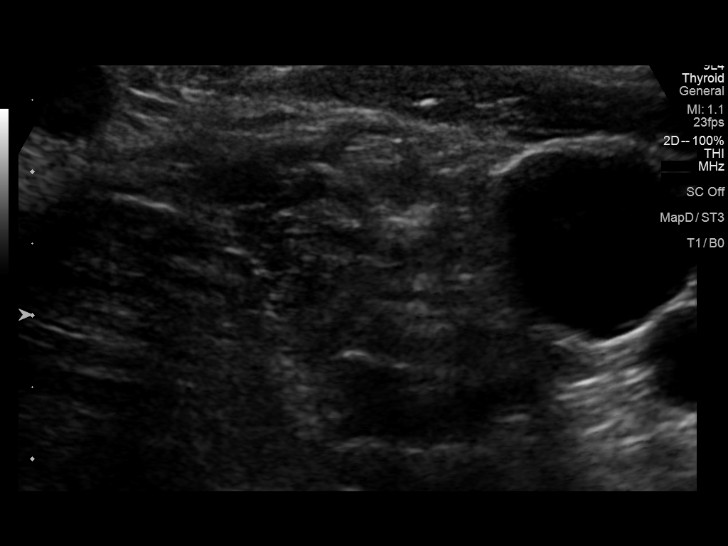

[14 of 19 positions shown; findings below may reference images not displayed]

FINDINGS: Isthmus: Surgically absent. There is no residual nodular soft tissue
within the isthmic resection bed.

Right lobe: Surgically absent. There is no residual nodular soft
tissue within the right lobectomy resection bed.

Left lobe: Surgically absent. There is no residual nodular soft
tissue within the left lobectomy resection bed.

_________________________________________________________

No regional cervical lymphadenopathy.
IMPRESSION: Post total thyroidectomy without evidence of residual or locally
recurrent disease.

## 2020-06-06 ENCOUNTER — Other Ambulatory Visit: Payer: Self-pay | Admitting: Surgery

## 2020-06-06 DIAGNOSIS — R590 Localized enlarged lymph nodes: Secondary | ICD-10-CM

## 2020-06-06 DIAGNOSIS — E890 Postprocedural hypothyroidism: Secondary | ICD-10-CM

## 2020-06-06 DIAGNOSIS — Z85850 Personal history of malignant neoplasm of thyroid: Secondary | ICD-10-CM

## 2020-06-06 DIAGNOSIS — E3122 Multiple endocrine neoplasia [MEN] type IIA: Secondary | ICD-10-CM

## 2020-06-24 ENCOUNTER — Ambulatory Visit
Admission: RE | Admit: 2020-06-24 | Discharge: 2020-06-24 | Disposition: A | Discharge status: Home / Self Care | Payer: 59 | Source: Ambulatory Visit | Attending: Surgery | Admitting: Surgery

## 2020-06-24 DIAGNOSIS — E3122 Multiple endocrine neoplasia [MEN] type IIA: Secondary | ICD-10-CM

## 2020-06-24 DIAGNOSIS — R590 Localized enlarged lymph nodes: Secondary | ICD-10-CM

## 2020-06-24 DIAGNOSIS — E890 Postprocedural hypothyroidism: Secondary | ICD-10-CM

## 2020-06-24 DIAGNOSIS — Z85850 Personal history of malignant neoplasm of thyroid: Secondary | ICD-10-CM

## 2020-06-24 IMAGING — CT CT NECK W/ CM
4 of 5 series · 16 of 33 positions shown, 18 images · IV contrast (iopamidol)
Comparison: Thyroid ultrasound [DATE]

CLINICAL DATA: Right supraclavicular lump. History of thyroid
cancer with thyroidectomy.

EXAM:
CT NECK WITH CONTRAST
TECHNIQUE: Multidetector CT imaging of the neck was performed using the
standard protocol following the bolus administration of intravenous
contrast.
CONTRAST:  75mL [UY] IOPAMIDOL ([UY]) INJECTION 61%

[Series 12: neck 2.00 br40 s3 axial soft · axial · 0.48mm/px · z∈[-826,-672]mm · 4 of 133 slices shown]
[im 27/133  soft-tissue]
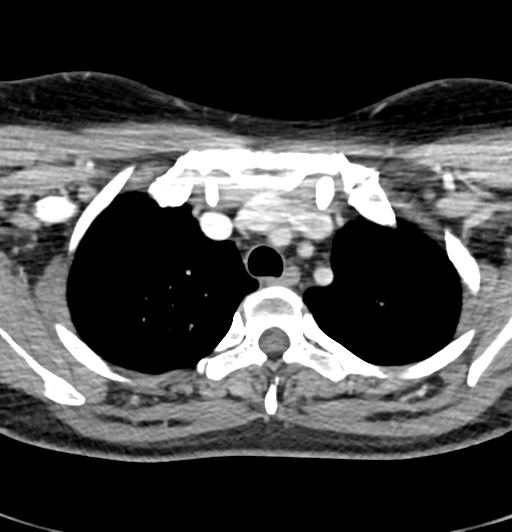
[im 53/133  soft-tissue]
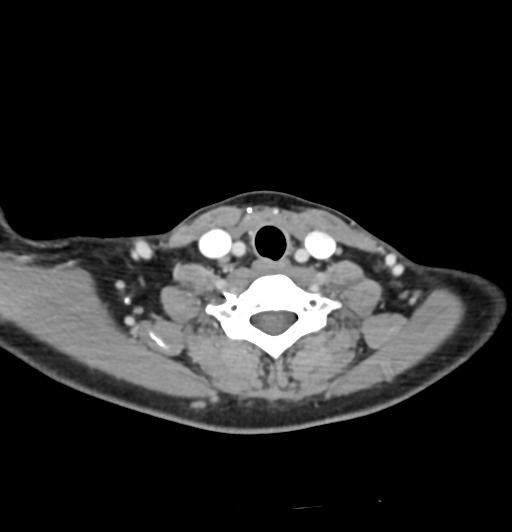
[im 80/133  soft-tissue]
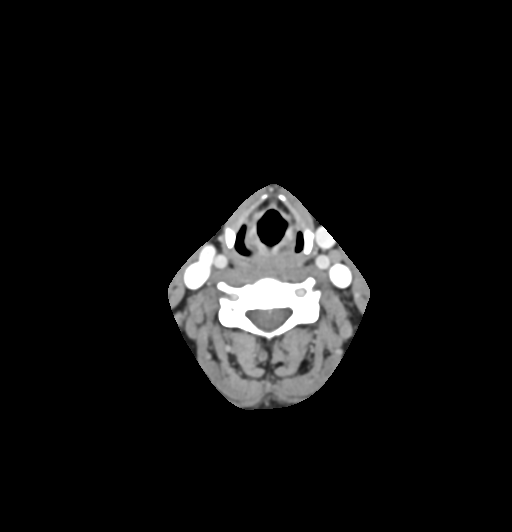
[im 106/133  soft-tissue]
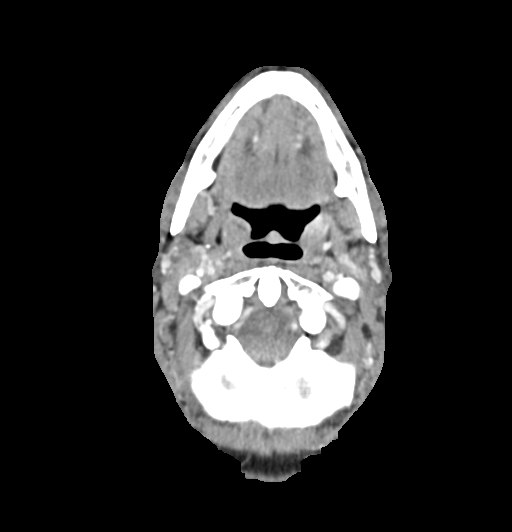

[Series 16: neck 2.00 br40 s3 angled axial (person_name) · axial · 0.48mm/px · z∈[-829,-676]mm · 4 of 133 slices shown, 5 images]
[im 27/133  soft-tissue]
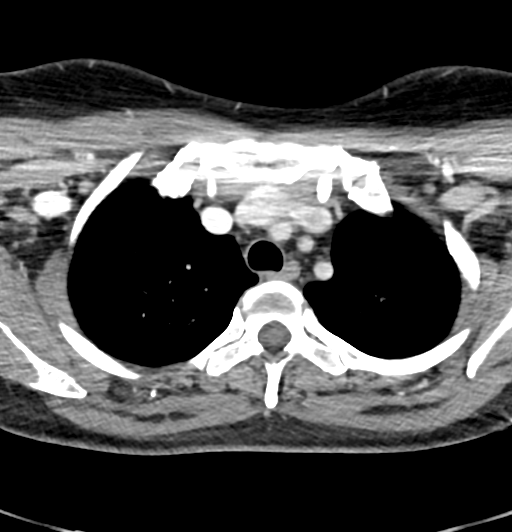
[im 27/133  bone]
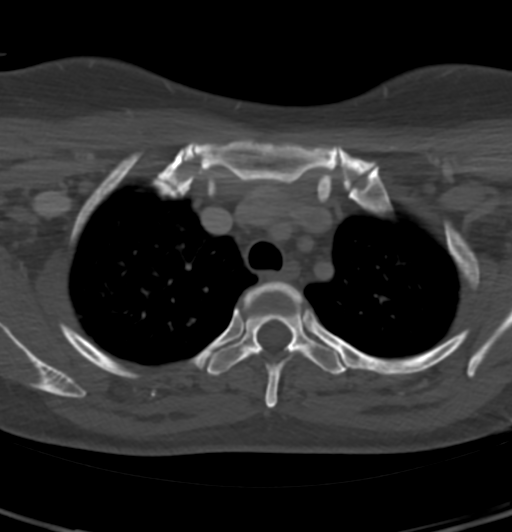
[im 53/133  bone]
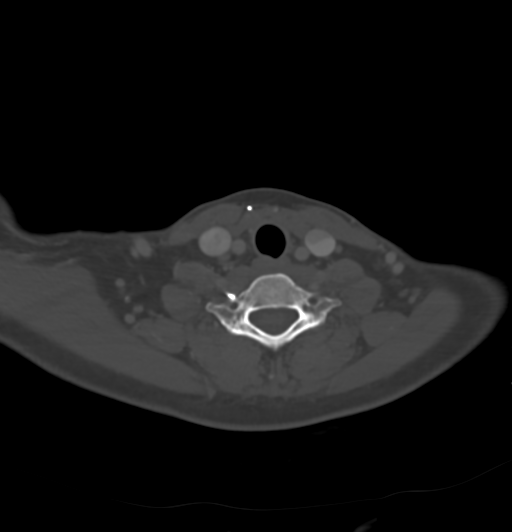
[im 80/133  bone]
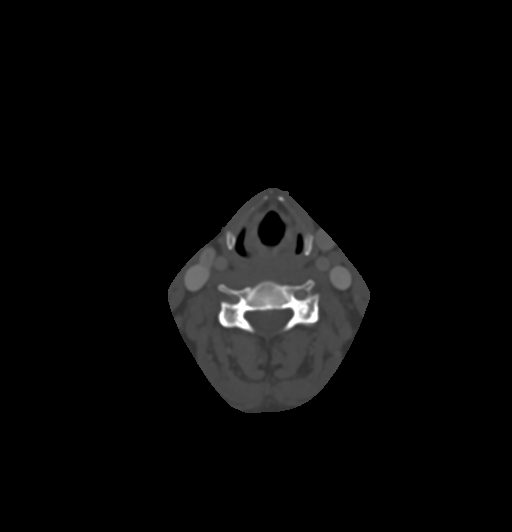
[im 106/133  bone]
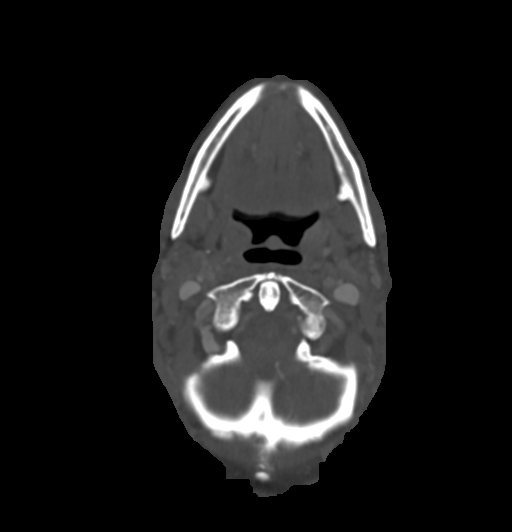

[Series 18: neck 2.00 br40 s3 angled (person_name) · coronal · 0.48mm/px · 3 of 126 slices shown]
[im 34/126  bone]
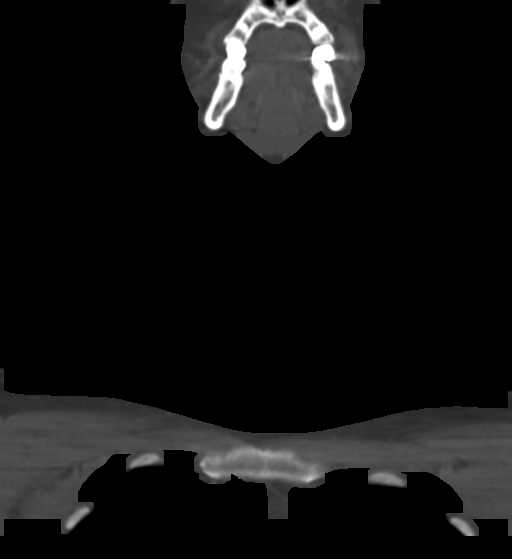
[im 53/126  bone]
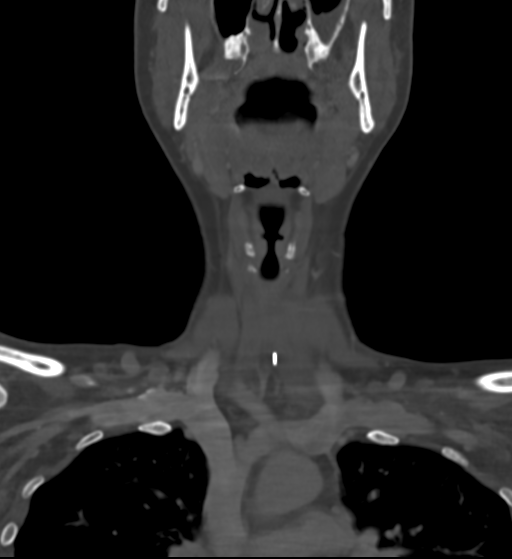
[im 73/126  bone]
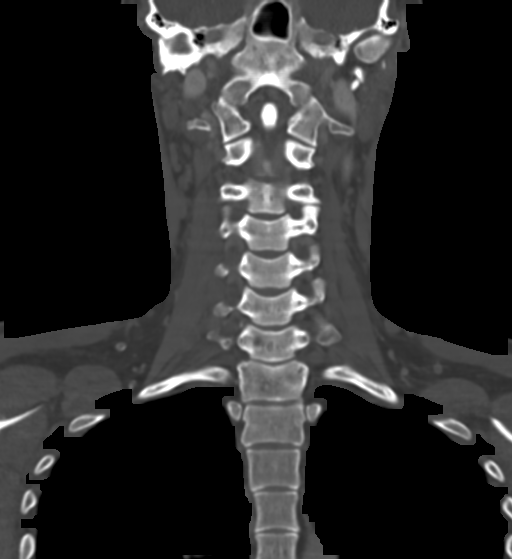

[Series 20: neck 2.00 br40 s3 sag (person_name) · sagittal · 0.50mm/px · 5 of 122 slices shown, 6 images]
[im 41/122  bone]
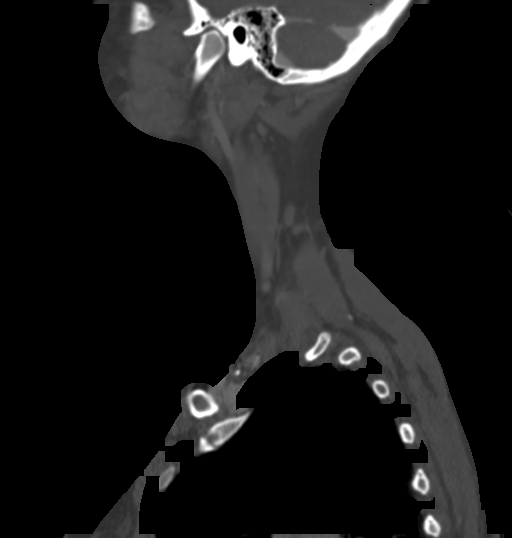
[im 51/122  bone]
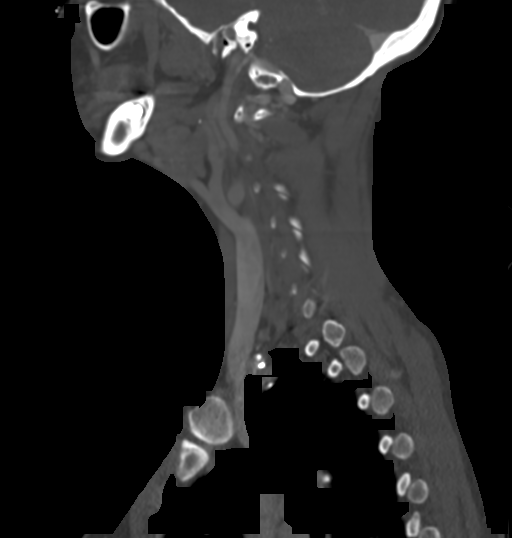
[im 61/122  soft-tissue]
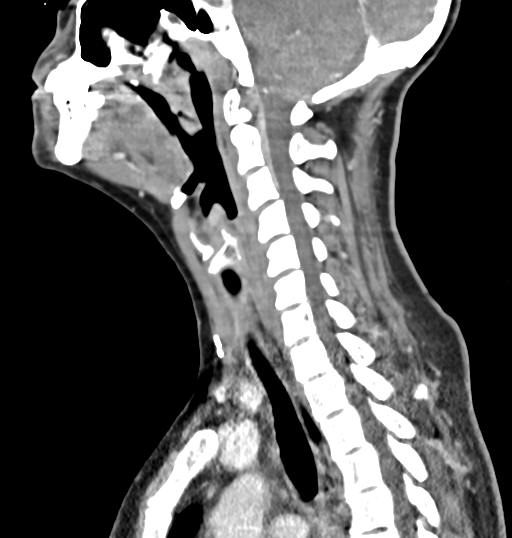
[im 61/122  bone]
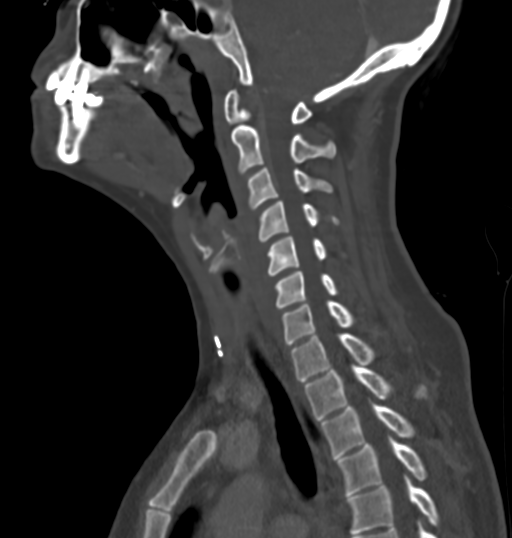
[im 71/122  bone]
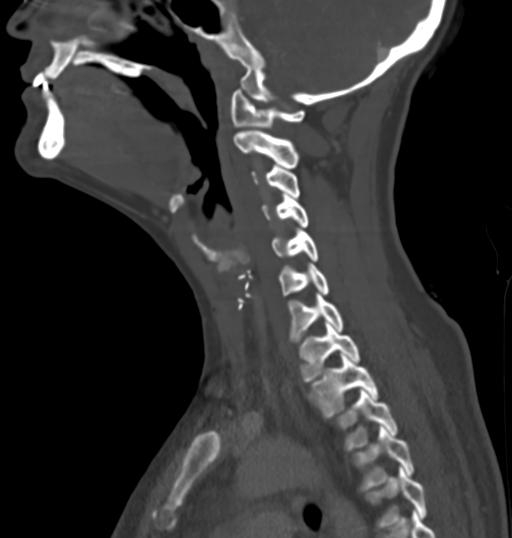
[im 81/122  bone]
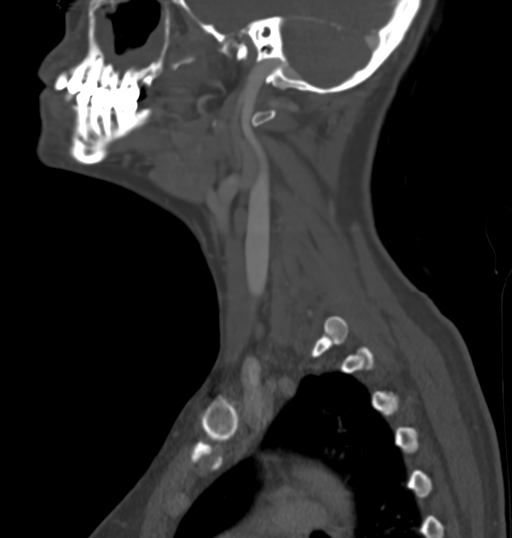

[16 of 33 positions shown; findings below may reference images not displayed]

FINDINGS: Pharynx and larynx: Normal. No mass or swelling.

Salivary glands: No inflammation, mass, or stone.

Thyroid: Postop total thyroidectomy with surgical clips in the soft
tissues. No recurrent mass in the thyroid bed

Lymph nodes: Right level 2 lymph nodes measure 8.3 mm and 9.9 mm.
Right posterior lymph node in the mid neck measures 5.5 mm on axial
image 55. Right supraclavicular palpable abnormality corresponds to
2 adjacent lymph nodes. The largest is 6.5 mm.

Left level 2 lymph nodes adjacent to each other measure 10.2 mm and
9.9 mm in diameter. Multiple small 4-6 mm posterior lymph nodes on
the left. Left lower posterior lymph node measures 5.5 mm.

Vascular: Normal vascular enhancement.

Limited intracranial: Negative

Visualized orbits: Not imaged

Mastoids and visualized paranasal sinuses: Mucosal edema in the left
maxillary sinus with air-fluid level. Remaining sinuses clear.
Mastoid clear.

Skeleton: No acute abnormality.

Upper chest: Lung apices clear bilaterally.

Other: None
IMPRESSION: Postop thyroidectomy for thyroid cancer. No recurrent mass in the
thyroid bed

Palpable abnormality corresponds to a cluster of 2 lymph nodes in
the right supraclavicular region. The largest of these measures
mm. This has heterogeneous enhancement.

Prominent lymph nodes in the neck bilaterally. None of these are
definitely pathologically enlarged however given the history of
thyroid cancer, close follow-up recommended. Consider fine-needle
aspiration biopsy for further evaluation. Correlate with thyroid
function testing.

## 2020-06-24 IMAGING — CT CT CHEST W/ CM
2 of 5 series · 13 of 36 positions shown, 16 images · IV contrast (iopamidol)
Comparison: None aside from prior abdominal imaging from [9T] and a
CT of the neck acquired on today's date.

CLINICAL DATA: RIGHT supraclavicular lump by report in a patient
with history of prior thyroid neoplasm.

EXAM:
CT CHEST WITH CONTRAST
TECHNIQUE: Multidetector CT imaging of the chest was performed during
intravenous contrast administration.
CONTRAST:  75mL [9T] IOPAMIDOL ([9T]) INJECTION 61%

[Series 7: chest with 2.00 br40 s3 cor · coronal · 0.60mm/px · 3 of 125 slices shown]
[im 25/125  lung]
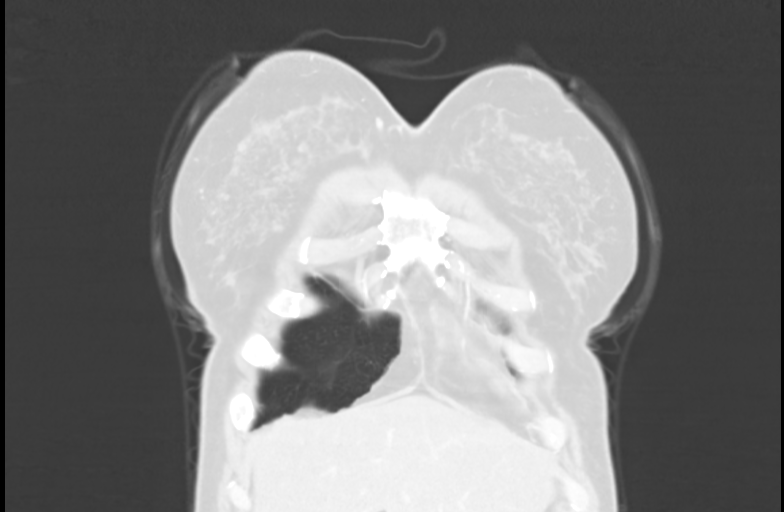
[im 50/125  lung]
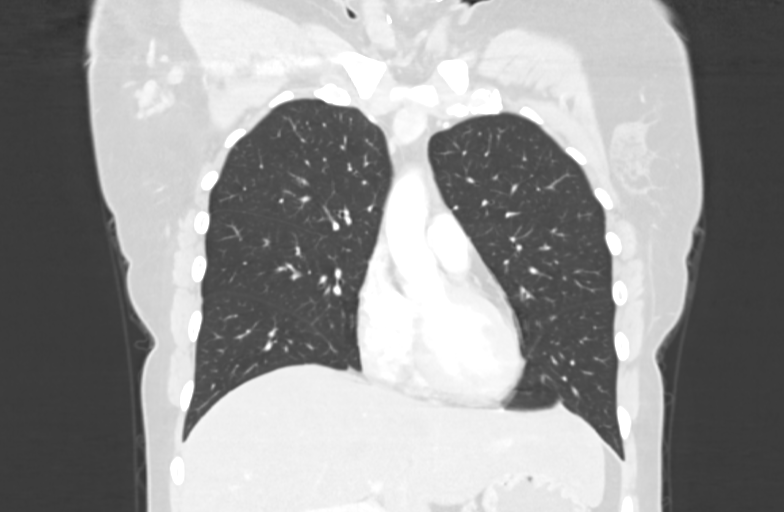
[im 75/125  lung]
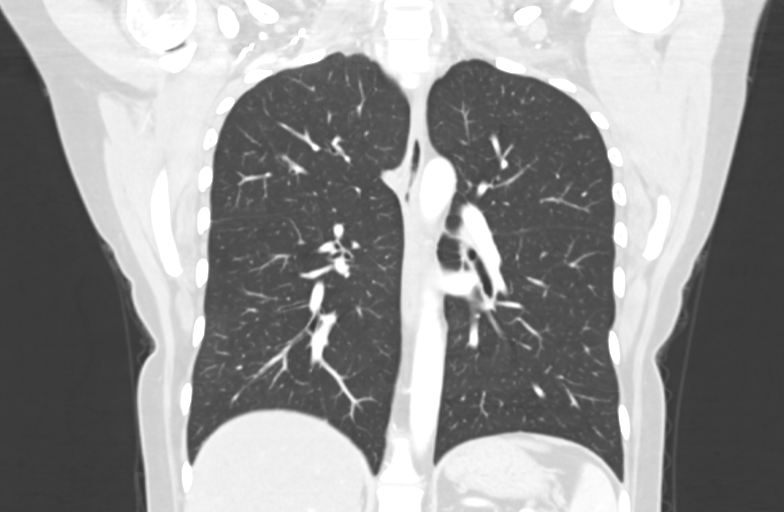

[Series 11: chest with 1.00 br40 s3 super d · axial · 0.67mm/px · z∈[-1023,-753]mm · 10 of 384 slices shown, 13 images]
[im 23/384  mediastinal]
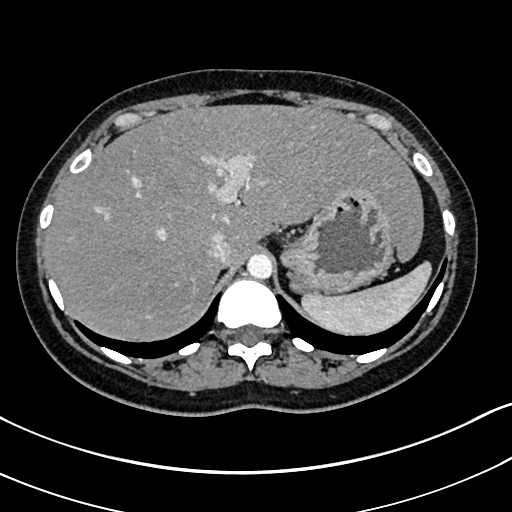
[im 23/384  lung]
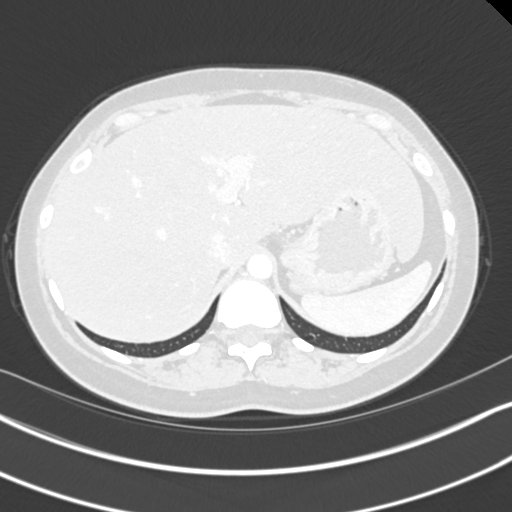
[im 68/384  lung]
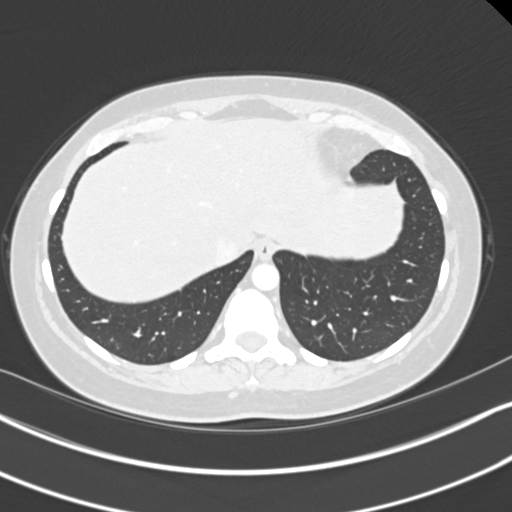
[im 113/384  lung]
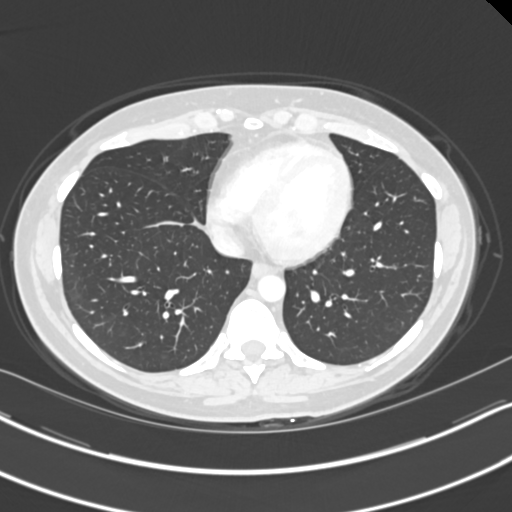
[im 136/384  lung]
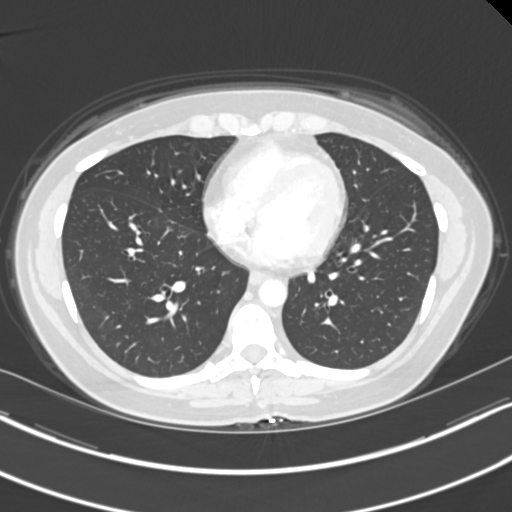
[im 181/384  mediastinal]
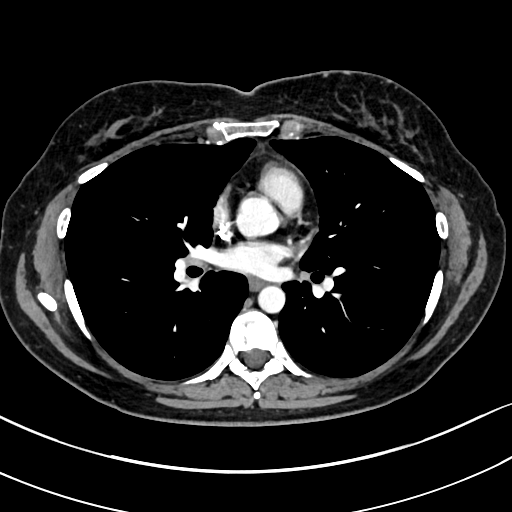
[im 181/384  lung]
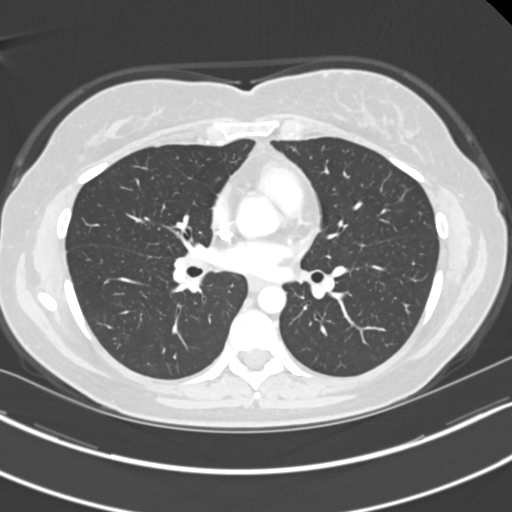
[im 203/384  lung]
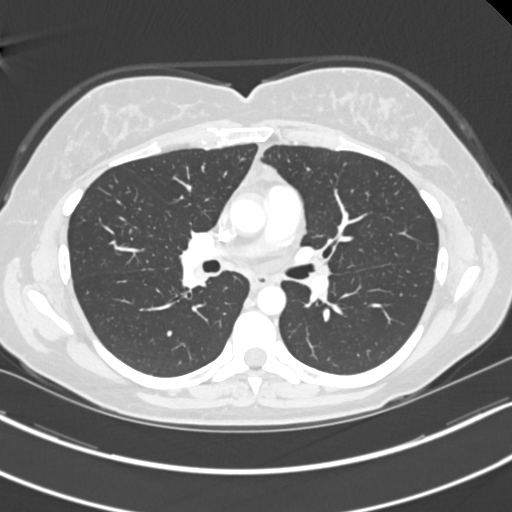
[im 248/384  lung]
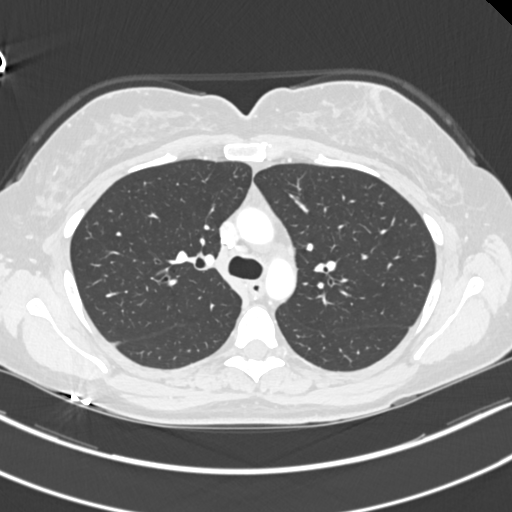
[im 293/384  lung]
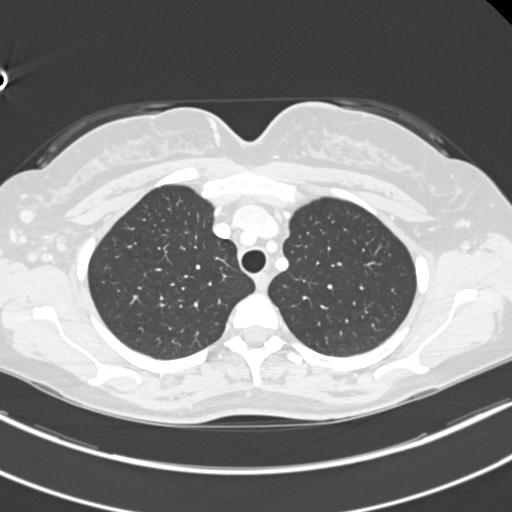
[im 316/384  mediastinal]
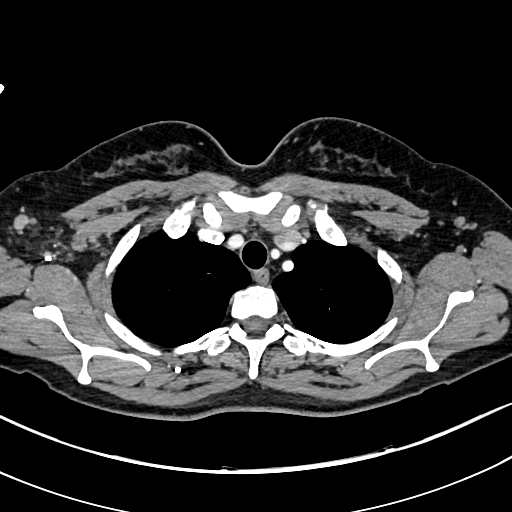
[im 316/384  lung]
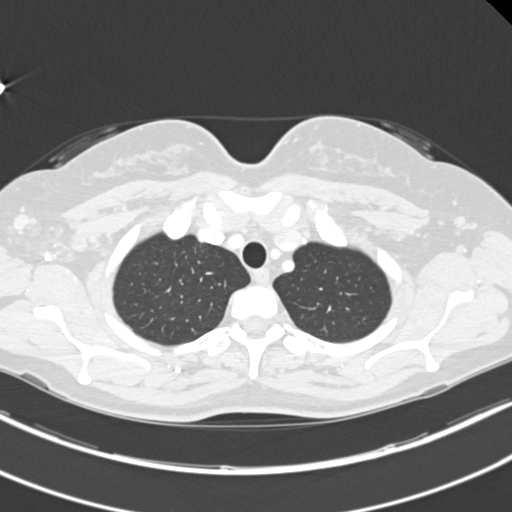
[im 361/384  lung]
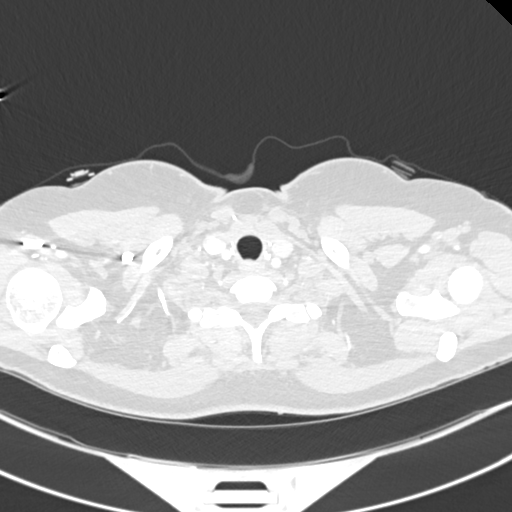

[13 of 36 positions shown; findings below may reference images not displayed]

FINDINGS: Cardiovascular: Normal caliber thoracic aorta. Normal heart size. No
pericardial effusion. Normal caliber central pulmonary vessels.

Mediastinum/Nodes: Post thyroidectomy, area of concern not seen on
the chest CT based on the gel cap over the RIGHT supraclavicular
region which is imaged on the CT of the neck, please see this CT for
further detail.

11 mm RIGHT axillary lymph node and a cluster of small RIGHT sided
lymph nodes in the RIGHT axilla. Enhancing 1.4 cm lesion in the
medial RIGHT breast. (Image 25, series 3) no internal mammary
lymphadenopathy.

No mediastinal lymphadenopathy. Mild fullness of RIGHT hilar nodal
tissue, the largest measuring approximately 1 cm on image 31 of
series 3

Lungs/Pleura: Minimal parenchymal scarring in the chest. No
effusion. Airways are patent. No suspicious pulmonary nodules.

Upper Abdomen: 1.2 cm lesion in the LEFT hepatic lobe with central
hyperattenuation that is close to blood pool. Hepatic steatosis.
Imaged portions of upper abdominal contents otherwise unremarkable.

Musculoskeletal: No acute musculoskeletal process. No destructive
bone finding. Sclerotic lesion in the midthoracic spine with
spiculated margins at the T7 level favored to represent a bone
island
IMPRESSION: 1. Enhancing 1.4 cm lesion in the medial RIGHT breast with
associated mildly enlarged RIGHT axillary lymph nodes. Findings are
concerning for breast cancer with nodal metastatic disease. Further
evaluation with mammography and ultrasound is suggested for further
evaluation.
2. 1.2 cm lesion in the LEFT hepatic lobe with central
hyperattenuation that is close to blood pool. This may represent a
hemangioma. However, given patient history of thyroid cancer,
hypervascular metastatic lesion could potentially have a similar
appearance. Hepatic MRI may be helpful for further evaluation.
3. Also with enlargement of RIGHT infrahilar and RIGHT hilar nodal
tissue that while borderline is suspicious in light of other
findings.
4. Sclerotic lesion in the midthoracic spine with spiculated margins
at the T7 level favored to represent a bone island. Attention on
follow-up is suggested.
5. Post thyroidectomy, area of concern not seen on the chest CT
based on the gel cap over the RIGHT supraclavicular region which is
imaged on the CT of the neck, please see this CT of the neck, please
see this CT of the neck for further detail.
6. Marked hepatic steatosis

These results will be called to the ordering clinician or
representative by the Radiologist Assistant, and communication
documented in the PACS or [REDACTED].

## 2020-06-24 MED ORDER — IOPAMIDOL (ISOVUE-300) INJECTION 61%
75.0000 mL | Freq: Once | INTRAVENOUS | Status: AC | PRN
  Administered 2020-06-24: 75 mL via INTRAVENOUS

## 2020-06-26 ENCOUNTER — Other Ambulatory Visit: Payer: Self-pay | Admitting: Surgery

## 2020-06-26 DIAGNOSIS — N649 Disorder of breast, unspecified: Secondary | ICD-10-CM

## 2020-06-30 ENCOUNTER — Other Ambulatory Visit: Payer: Self-pay | Admitting: Oncology

## 2020-06-30 ENCOUNTER — Telehealth: Payer: Self-pay

## 2020-06-30 DIAGNOSIS — C50919 Malignant neoplasm of unspecified site of unspecified female breast: Secondary | ICD-10-CM

## 2020-06-30 HISTORY — DX: Malignant neoplasm of unspecified site of unspecified female breast: C50.919

## 2020-06-30 NOTE — Telephone Encounter (Signed)
Pt called stating she would like to be established with Dr Jana Hakim as she is having a bx today at [facility]. Informed pt of our fax number in the event that she does test positive for BRCA and informed her we would be happy to take care of her in the event she needs care. Pt verbalized thanks and understanding.

## 2020-07-03 ENCOUNTER — Other Ambulatory Visit (HOSPITAL_COMMUNITY): Payer: Self-pay | Admitting: General Surgery

## 2020-07-03 ENCOUNTER — Other Ambulatory Visit: Payer: Self-pay | Admitting: General Surgery

## 2020-07-03 ENCOUNTER — Ambulatory Visit
Admission: RE | Admit: 2020-07-03 | Discharge: 2020-07-03 | Disposition: A | Discharge status: Home / Self Care | Payer: Self-pay | Source: Ambulatory Visit | Attending: Radiation Oncology | Admitting: Radiation Oncology

## 2020-07-03 ENCOUNTER — Telehealth: Payer: Self-pay | Admitting: Oncology

## 2020-07-03 ENCOUNTER — Inpatient Hospital Stay
Admission: RE | Admit: 2020-07-03 | Discharge: 2020-07-03 | Disposition: A | Discharge status: Home / Self Care | Payer: Self-pay | Source: Ambulatory Visit | Attending: Radiation Oncology | Admitting: Radiation Oncology

## 2020-07-03 ENCOUNTER — Other Ambulatory Visit: Payer: Self-pay | Admitting: Radiation Oncology

## 2020-07-03 DIAGNOSIS — C50919 Malignant neoplasm of unspecified site of unspecified female breast: Secondary | ICD-10-CM

## 2020-07-03 DIAGNOSIS — C50311 Malignant neoplasm of lower-inner quadrant of right female breast: Secondary | ICD-10-CM

## 2020-07-03 NOTE — Telephone Encounter (Signed)
Received a new pt referral for new dx of breast cancer. Kayla Little has been cld and scheduled to see Dr. Jana Hakim on 5/3 at 4pm w/labs at 3:30pm. Pt aware to arrive 15 minutes early.

## 2020-07-06 ENCOUNTER — Other Ambulatory Visit: Payer: Self-pay

## 2020-07-06 ENCOUNTER — Ambulatory Visit (HOSPITAL_COMMUNITY)
Admission: RE | Admit: 2020-07-06 | Discharge: 2020-07-06 | Disposition: A | Discharge status: Home / Self Care | Payer: 59 | Source: Ambulatory Visit | Attending: General Surgery | Admitting: General Surgery

## 2020-07-06 DIAGNOSIS — C50311 Malignant neoplasm of lower-inner quadrant of right female breast: Secondary | ICD-10-CM | POA: Insufficient documentation

## 2020-07-06 IMAGING — MR MR BREAST BILAT WO/W CM
5 of 11 series · 26 of 48 positions shown · IV contrast (7ml GADAVIST)
Comparison: No prior MRI available for comparison. Correlation made
with prior mammogram and ultrasound images.

CLINICAL DATA: 34-year-old female with diagnosis of right breast
cancer which was originally found on a chest CT performed
[DATE]. She has family history of breast cancer in her mother
and maternal grandmother. She had 2 ultrasound-guided biopsies of
masses in the medial right breast, both demonstrating invasive
ductal carcinoma. She also had a right axillary mass biopsied which
showed invasive ductal carcinoma with no residual nodal tissue,
which could represent an additional primary focus or an entirely
replaced metastatic lymph node. A biopsy of the left breast was
benign. She presents for staging MRI prior to initiating neoadjuvant
chemotherapy.

LABS:  None.
EXAM:
BILATERAL BREAST MRI WITH AND WITHOUT CONTRAST
TECHNIQUE: Multiplanar, multisequence MR images of both breasts were obtained
prior to and following the intravenous administration of 20 mL of
MultiHance

[Series 3: T2 · axial · 3.0mm · 0.89mm/px · z∈[-98,+98]mm · 4 of 66 slices shown]
[im 1/66]
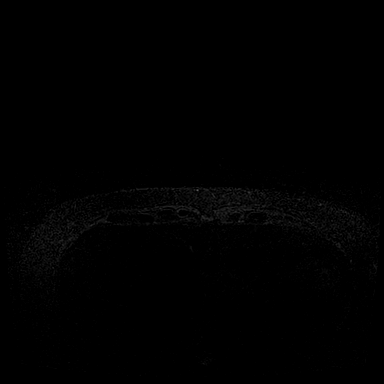
[im 22/66]
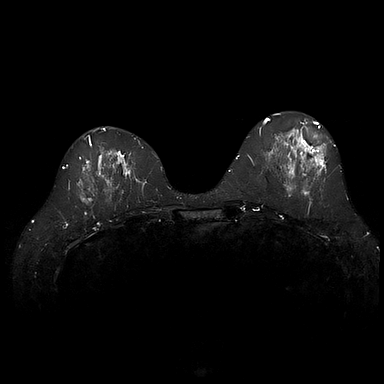
[im 44/66]
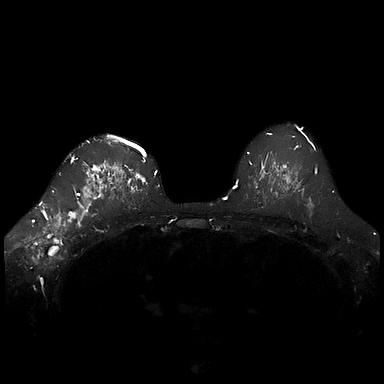
[im 66/66]
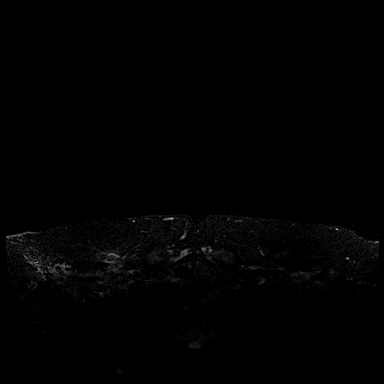

[Series 4: T1 fat-sat · axial · 1.2mm · 0.80mm/px · z∈[-95,+95]mm · 7 of 160 slices shown (1 of 3)]
[im 1/160]
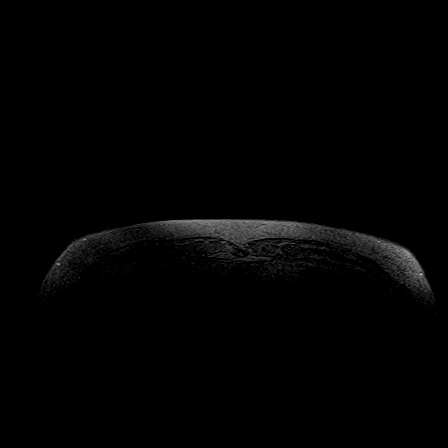
[im 27/160]
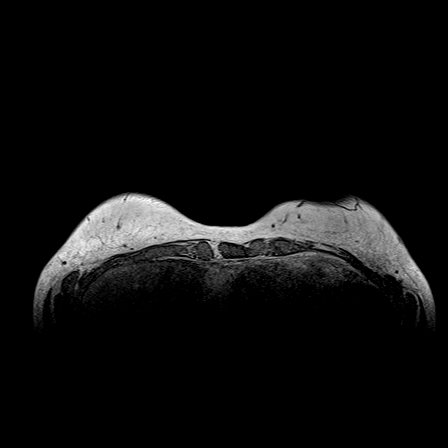
[im 54/160]
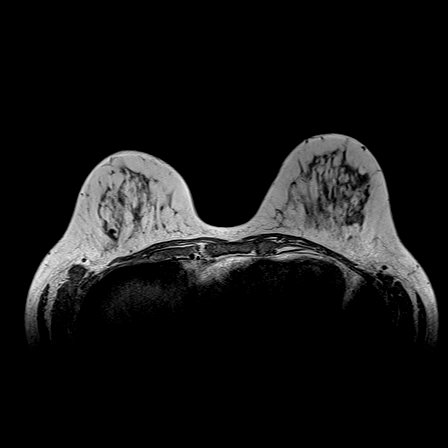
[im 80/160]
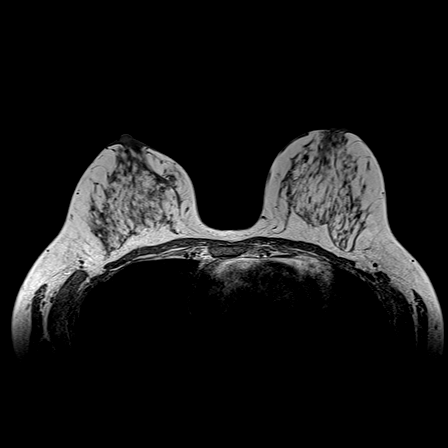
[im 107/160]
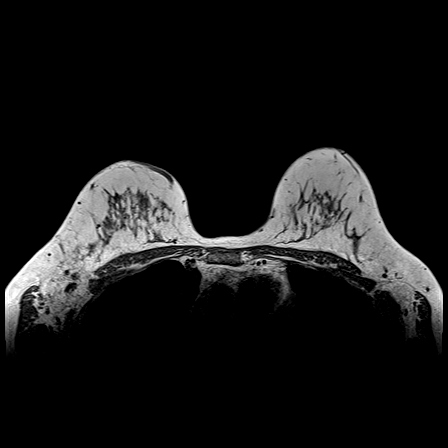
[im 133/160]
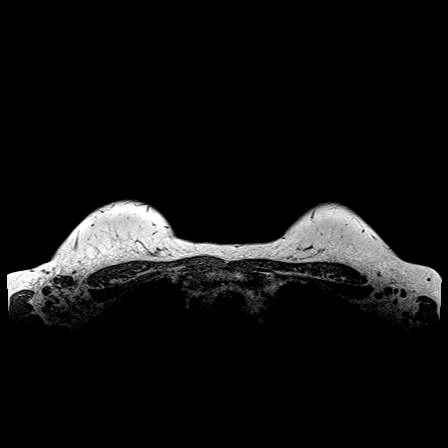
[im 160/160]
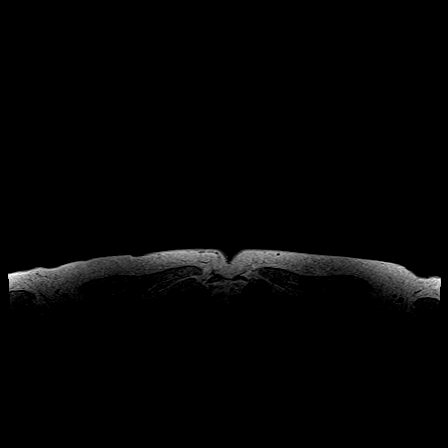

[Series 6: T1 fat-sat · axial · 1.6mm · 0.87mm/px · z∈[-95,+95]mm · 5 of 120 slices shown (2 of 3)]
[im 1/120]
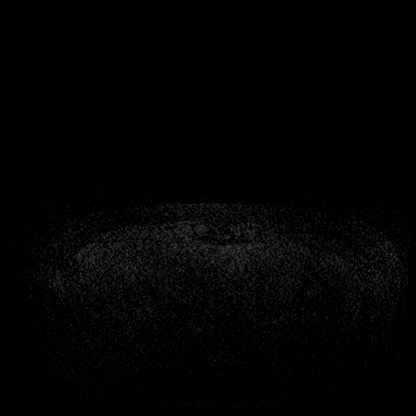
[im 30/120]
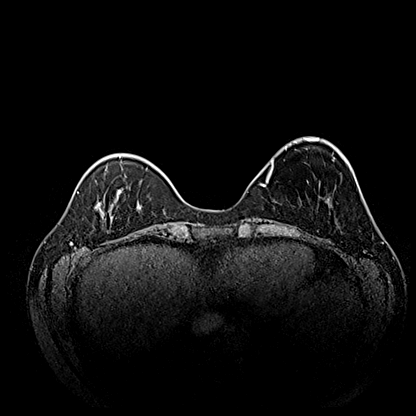
[im 60/120]
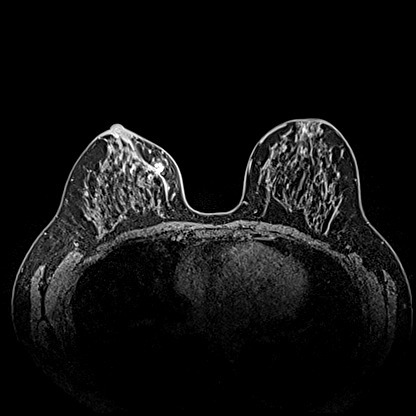
[im 90/120]
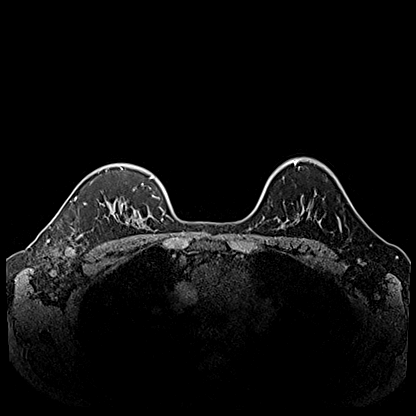
[im 120/120]
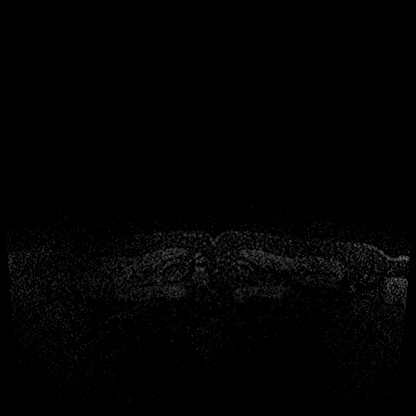

[Series 7: T1 fat-sat · axial · 1.6mm · 0.87mm/px · z∈[-95,+95]mm · 5 of 120 slices shown (3 of 3)]
[im 1/120]
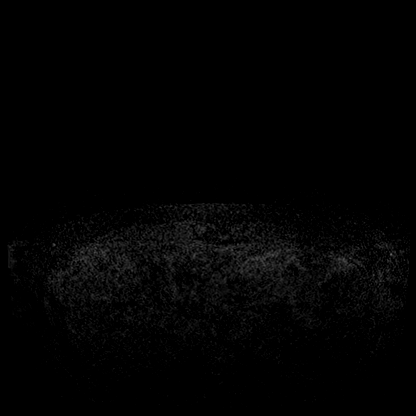
[im 30/120]
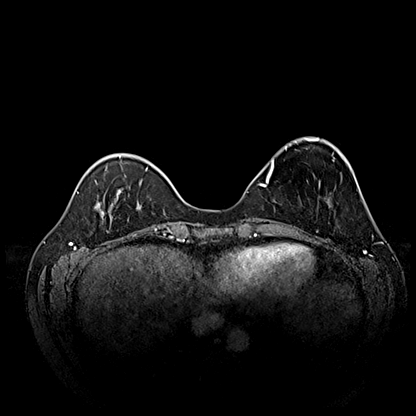
[im 60/120]
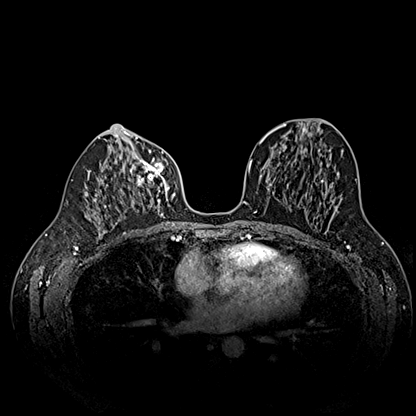
[im 90/120]
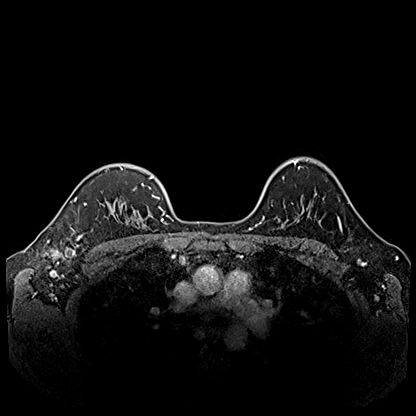
[im 120/120]
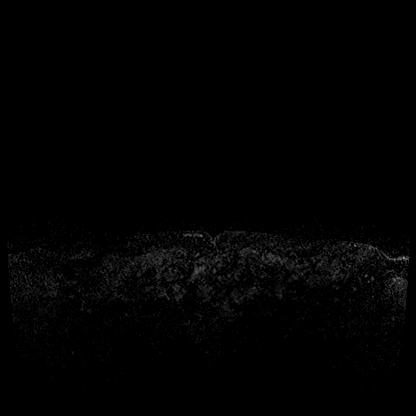

[Series 8: T1 · axial · 1.6mm · 0.87mm/px · z∈[-95,+95]mm · 5 of 120 slices shown]
[im 1/120]
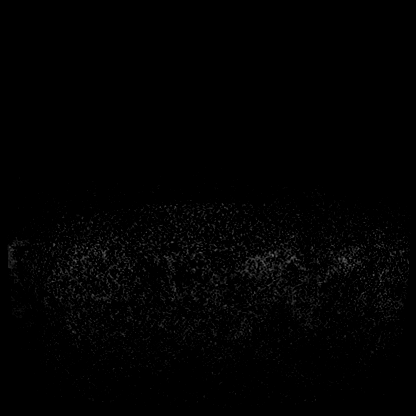
[im 30/120]
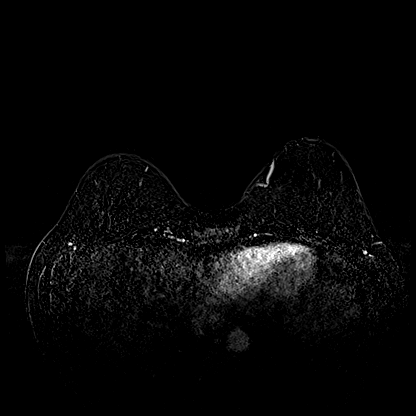
[im 60/120]
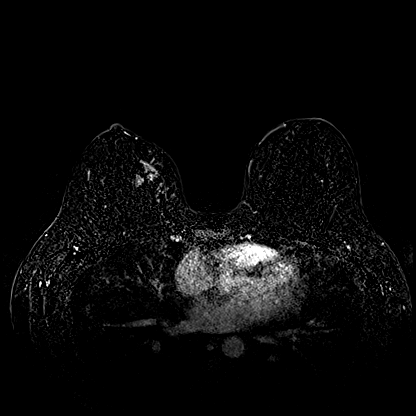
[im 90/120]
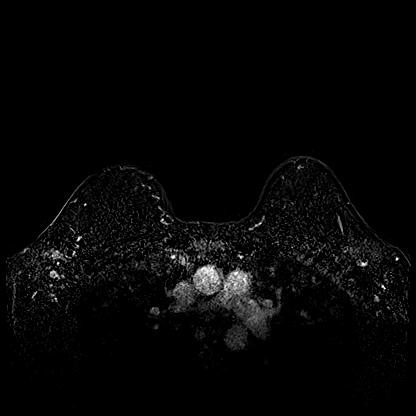
[im 120/120]
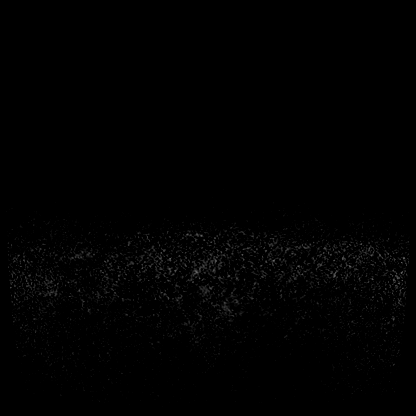

[26 of 48 positions shown; findings below may reference images not displayed]

Three-dimensional MR images were rendered by post-processing of the
original MR data on an independent workstation. The
three-dimensional MR images were interpreted, and findings are
reported in the following complete MRI report for this study. Three
dimensional images were evaluated at the independent interpreting
workstation using the DynaCAD thin client.
FINDINGS: Breast composition: c. Heterogeneous fibroglandular tissue.

Background parenchymal enhancement: Mild.

Right breast: In the slightly lower inner quadrant of the right
breast, middle depth, there is a round enhancing mass measuring
cm (series 8, image 65). Susceptibility artifact from the biopsy
marking clip is seen along the medial aspect of the mass. Superior
and medial to the mass, there is an area of non mass enhancement
(series 8, image 62). This corresponds with the asymmetry and
calcifications identified mammographically. The mass and non mass
enhancement together measure approximately 5.4 x 3.0 x 4.0 cm.
Susceptibility artifact is seen within the region of non mass
enhancement, corresponding to the second site of biopsy
demonstrating malignancy.

Left breast: No mass or abnormal enhancement. Susceptibility
artifact from the benign biopsy is identified in the lower outer
quadrant of the left breast.

Lymph nodes: There are numerous asymmetrically enlarged right
axillary lymph nodes (at least 8). There is one 4 mm internal
mammary lymph node (series 7, image 49).

Ancillary findings:  None.
IMPRESSION: 1. There is an enhancing mass with adjacent non mass enhancement in
the lower-inner right breast, which together measure 5.4 cm. The
mass and non mass enhancement correspond with the 2 biopsy-proven
areas of malignancy.

2. No other suspicious enhancement is identified in the right
breast.

3. There is one 4 mm right internal mammary lymph node with unclear
significance as there are no prior MRIs to compare for stability.

4.  Numerous abnormal right axillary lymph nodes.

5.  No evidence of left breast malignancy.

RECOMMENDATION:
1. Review of the surgical notes indicates that the patient is
currently planning neoadjuvant chemotherapy followed by mastectomy.
If this remains the case, no additional biopsies are necessary.
However, if the patient decides to pursue breast conservation, then
additional biopsies as described in the initial diagnostic report
would be recommended. If those are benign, and breast conservation
is desired, than MRI biopsy of distant areas of non mass enhancement
in the medial right breast would be recommended to denote extent of
disease prior to initiation of neoadjuvant chemotherapy.

BI-RADS CATEGORY  6: Known biopsy-proven malignancy.

## 2020-07-06 MED ORDER — GADOBUTROL 1 MMOL/ML IV SOLN
7.0000 mL | Freq: Once | INTRAVENOUS | Status: AC | PRN
  Administered 2020-07-06: 7 mL via INTRAVENOUS

## 2020-07-07 ENCOUNTER — Other Ambulatory Visit: Payer: Self-pay

## 2020-07-07 DIAGNOSIS — C50919 Malignant neoplasm of unspecified site of unspecified female breast: Secondary | ICD-10-CM

## 2020-07-07 NOTE — Progress Notes (Signed)
New Breast Cancer Diagnosis: Right Breast LIQ  Did patient present with symptoms (if so, please note symptoms) or screening mammography?:Palpable mass    Location and Extent of disease : Right Inner Breast measured up to 2.6 cm with associated micro-calcifications spanning up to 5.1 cm.  There was a 9 mm lesion in the inner right breast, and 2 indeterminate masses in the outer breast. There were 3 abnormal appearing lymph nodes in her right axilla.  Ultrasound performed on the left breast showing a 1.3 cm mass in the lower outer breast possibly a fibroadenoma and no evidence of adenopathy in the left axilla.  MRI Breast 07/06/2020: There is an enhancing mass with adjacent non mass enhancement in the lower-inner right breast, which together measure 5.4 cm. The mass and non mass enhancement correspond with the 2 biopsy-proven areas of malignancy.  No other suspicious enhancement is identified in the right breast.  There is one 4 mm right internal mammary lymph node with unclear significance as there are no prior MRIs to compare for stability.  Numerous abnormal right axillary lymph nodes.  No evidence of left breast malignancy.   Histology per Pathology Report: Right Breast, dominant lesion was grade 1, Invasive Ductal Carcinoma.  Medial lesion 4 cm from the nipple was also a grade 1 Invasive Ductal Carcinoma, axillary lymph node was also positive and consistent with grade 1 Invasive Ductal Carcinoma Right Breast Biopsy 06/30/2020  Receptor Status: ER(positive), PR (positive), Her2-neu (positive), Ki-(%)  Left Breast Biopsy 06/30/2020   Surgeon and surgical plan, if any: Dr. Marlou Starks 07/02/2020 -Given the amount of disease she has I recommend neoadjuvant chemotherapy. -We will refer her to medical and radiation oncology to discuss this -She will likely need staging studies and biopsies of the supraclavicular nodes. -At this point my recommendation would be for mastectomy on the right and she is in  agreement with this. -We will also refer her to plastic surgery to talk about reconstructive options.  Medical oncologist, treatment if any:   Dr. Jana Hakim 07/08/2020    Family History of Breast/Ovarian/Prostate Cancer: Dad Prostate, Breast cancer Mom, MGM Breast  Lymphedema issues, if any:  None  Pain issues, if any: None   SAFETY ISSUES: Prior radiation? No Pacemaker/ICD? No Possible current pregnancy? IUD Is the patient on methotrexate? 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

## 2020-07-07 NOTE — Progress Notes (Signed)
Kayla Little  [phone] [phone]     ID: Kayla Little DOB: 1985-06-10  MR#: [id number]  GYB#:[id number]  Patient Care Team: [facility] as PCP - General (Family Medicine) Kayla Little, Kayla Dad, MD as Consulting Physician (Oncology) Kayla Rudd, MD as Consulting Physician (Radiation Oncology) Kayla Cruel, MD OTHER MD:  CHIEF COMPLAINT: Triple positive locally advanced breast cancer  CURRENT TREATMENT: Neoadjuvant chemotherapy   HISTORY OF CURRENT ILLNESS: Kayla Little "Kayla Little" (the daughter of my patient, Kayla Little, whom I saw earlier today). has a history of medullary thyroid cancer, status post total thyroidectomy on 04/12/2019 under Dr. Harlow Little.   More recently she presented to her GYN with a right supraclavicular lump, prompting neck and chest CT on 06/24/2020. Neck CT showed: no recurrent mass in thyroid bed, palpable abnormality corresponds to a cluster of 2 lymph nodes in right supraclavicular region; prominent lym ph nodes in neck bilaterally. Chest CT showed: enhancing 1.4 cm lesion in medial right breast with associated mildly enlarged right axillary lymph nodes; 1.2 cm lesion in left hepatic lobe with central hyperattenuation; enlargement of right infrahilar and right hilar nodal tissue is borderline suspicious; sclerotic lesion with spiculated margins at T7 level favored to represent a bone island.  She underwent bilateral diagnostic mammography with tomography and bilateral breast ultrasonography at [facility] on 06/27/2020 showing: breast density category D; Right Breast 1. 2.6 cm irregular mass in inner breast, with associated microcalcifications spanning up to 5.1 cm, corresponding to chest CT finding. 2. Adjacent 0.9 cm mass in inner breast, 2 cm away from dominant mass. 3. Two indeterminate masses in outer breast. 4. At least 3 enlarged lymph nodes in right axilla  Left Breast 1. Indeterminate 1.2 cm mass  in lower-outer breast, possibly a fibroadenoma 2. No enlarged lymphadenopathy in left axilla.  Accordingly on 06/30/2020 she proceeded to biopsy of the right breast area in question. The pathology from this procedure ([id number]) showed:  1. Dominant mass  - invasive ductal carcinoma, grade 1  - ductal carcinoma in situ  - Prognostic indicators significant for: estrogen receptor, 90% positive and progesterone receptor, 80% positive, both with moderate staining intensity.  HER2 equivocal by immunohistochemistry (2+), but positive by FISH, with a signals ratio of 2.2 and the number per cell greater than 5.. 2. Smaller mass  - invasive ductal carcinoma, grade 1  - ductal carcinoma in situ  - Prognostic indicators significant for: estrogen receptor, 100% positive with strong staining intensity and progesterone receptor, 50% positive with moderate staining intensity.  HER2 positive by immunohistochemistry (3+). 3. Right axillary lymph node  - invasive ductal carcinoma, grade 1 Prognostic indicators significant for: estrogen receptor, 100% positive and progesterone receptor, 90% positive, both with strong staining intensity. HER2 equivocal by immunohistochemistry (2+), {but (positive or negative) by fluorescent in situ hybridization   Biopsy of the left breast area was performed the same day. Pathology from the procedure ([id number]) was benign.  She underwent breast MRI on 07/06/2020 showing: breast composition C; enhancing mass with adjacent non-mass enhancement in lower-inner right breast, which together measure 5.4 cm, corresponding with two biopsy-proven areas of malignancy; no other suspicious enhancement identified in right breast; one 4 mm right internal mammary lymph node with unclear significance; numerous abnormal right axillary lymph nodes; no evidence of left breast malignancy.  Cancer Staging Malignant neoplasm of upper-inner quadrant of right breast in female, estrogen receptor positive  ([place]) Staging form: Breast, AJCC 8th Edition - Clinical stage  from 07/08/2020: Stage IIIA (cT2, cN3, cM0, G1, ER+, PR+, HER2+) - Signed by Kayla Cruel, MD on 07/08/2020 Stage prefix: Initial diagnosis Histologic grading system: 3 grade system   The patient's subsequent history is as detailed below.   INTERVAL HISTORY: Kayla Little" was evaluated in the breast cancer clinic on 07/08/2020 accompanied by her mother.    REVIEW OF SYSTEMS: The patient denies unusual headaches, visual changes, nausea, vomiting, stiff neck, dizziness, or gait imbalance. There has been no cough, phlegm production, or pleurisy, no chest pain or pressure, and no change in bowel or bladder habits. The patient denies fever, rash, bleeding, unexplained fatigue or unexplained weight loss.  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  A detailed review of systems was otherwise entirely negative.   COVID 19 VACCINATION STATUS: Status post Moderna x2 followed by [employer] booster   PAST MEDICAL HISTORY: Past Medical History:  Diagnosis Date  . ADD (attention deficit disorder)   . Breast cancer ([place]) 06/30/2020  . Breast lump    R breast  . Depression   . Family history of breast cancer   . Family history of lymphoma   . Family history of ovarian cancer   . Family history of prostate cancer   . 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 Hx of varicella   . Little stone 2012  . Medullary thyroid carcinoma ([place])   . Monoallelic mutation of CHEK2 gene in female patient   . Postpartum care following vaginal delivery (7/27) 10/02/2014, June 16 , 2020  . Thyroid cancer ([place]) 2021    PAST SURGICAL HISTORY: Past Surgical History:  Procedure Laterality Date  . NO PAST SURGERIES    . THYROIDECTOMY N/A 04/12/2019   Procedure: TOTAL THYROIDECTOMY WITH LIMITED LYMPH NODE DISSECTION;  Surgeon: Kayla Gemma, MD;  Location: [facility];  Service: General;  Laterality: N/A;  .  UNILATERAL SALPINGECTOMY Right 07/09/2017   Procedure: RIGHT SALPINGECTOMY WITH REMOVAL OF ECTOPIC PREGNANCY;  Surgeon: Kayla Fallen, MD;  Location: [facility];  Service: Gynecology;  Laterality: Right;    FAMILY HISTORY: Family History  Problem Relation Age of Onset  . Breast cancer Mother 85       2 types of cancer one side mastectomy with chemo  . Endometriosis Mother   . Endometriosis Maternal Grandmother   . Breast cancer Maternal Grandmother        post menopausal  . Diabetes Maternal Grandmother   . Cancer Maternal Grandfather        prostate and lymphoma  . Alzheimer's disease Paternal Grandmother   . Cancer Father 61       prostate cancer  . Aneurysm Paternal Grandfather        d. 52s   Please see genetic counseling note dated 11/23/2018 for full family information.   GYNECOLOGIC HISTORY:  No LMP recorded. (Menstrual status: IUD). Menarche: 35 years old Age at first live birth: 35 years old [place] P 2 Patient has a Mirena IUD in place HRT n/a  Hysterectomy? no BSO?  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  Her husband Barnabas Lister works for [employer].  Their children are Dylan 5 and Emma 2.  Barnabas Lister has a 16 year old daughter, the patient's stepdaughter Hildred Alamin, currently attending [employer].  The patient is a [employer]    ADVANCED DIRECTIVES: In the absence of any documents to the  contrary the patient's husband is her healthcare power of attorney   HEALTH MAINTENANCE: Social History   Tobacco Use  . Smoking status: Current Some Day Smoker    Packs/day: 0.50    Years: 5.00    Pack years: 2.50    Types: Cigarettes  . Smokeless tobacco: Never Used  . Tobacco comment: Actively trying to quit.  Vaping Use  . Vaping Use: Never used  Substance Use Topics  . Alcohol use: Yes    Alcohol/week: 7.0 - 14.0 standard drinks    Types: 7 - 14 Glasses of wine per week     Comment: glass of wine each night   . Drug use: Not Currently    Types: Marijuana    Comment: not during pregnancy, last use was 03/27/2019     Colonoscopy: n/a (age)  PAP:   Bone density: n/a (age)   No Known Allergies  Current Outpatient Medications  Medication Sig Dispense Refill  . acetaminophen (TYLENOL) 325 MG tablet Take 2 tablets (650 mg total) by mouth every 4 (four) hours as needed (for pain scale < 4). 100 tablet 0  . ibuprofen (ADVIL) 600 MG tablet Take 1 tablet (600 mg total) by mouth every 6 (six) hours. 30 tablet 0  . levonorgestrel (MIRENA) 20 MCG/24HR IUD 1 each by Intrauterine route once.    Marland Kitchen levothyroxine (SYNTHROID) 175 MCG tablet 1 tablet in the morning on an empty stomach    . sertraline (ZOLOFT) 50 MG tablet Take 50 mg by mouth at bedtime.     Marland Kitchen VYVANSE 40 MG capsule Take 40 mg by mouth every morning.     No current facility-administered medications for this visit.    OBJECTIVE: White woman in no acute distress  Vitals:   07/08/20 1613  BP: 134/88  Pulse: 82  Resp: 18  Temp: 97.9 F (36.6 C)  SpO2: 100%     Body mass index is 26.42 kg/m.   Wt Readings from Last 3 Encounters:  07/08/20 163 lb 11.2 oz (74.3 kg)  07/08/20 164 lb 8 oz (74.6 kg)  04/15/19 160 lb (72.6 kg)      ECOG FS:1 - Symptomatic but completely ambulatory  Ocular: Sclerae unicteric, pupils round and equal Ear-nose-throat: Wearing a mask Lymphatic: Right supraclavicular adenopathy measuring approximately half a centimeter, 2 lesions, firm, slightly movable, no erythema or tenderness. Lungs no rales or rhonchi Heart regular rate and rhythm Abd soft, nontender, positive bowel sounds MSK no focal spinal tenderness, no joint edema Neuro: non-focal, well-oriented, appropriate affect Breasts: The right breast is status post recent biopsies and there is a significant ecchymosis.  I do not palpate a mass in the left breast or either axilla   LAB RESULTS:  CMP     Component Value  Date/Time   NA 139 07/08/2020 1400   NA 142 02/16/2017 1332   K 3.9 07/08/2020 1400   CL 103 07/08/2020 1400   CO2 25 07/08/2020 1400   GLUCOSE 101 (H) 07/08/2020 1400   BUN 8 07/08/2020 1400   BUN 9 02/16/2017 1332   CREATININE 0.91 07/08/2020 1400   CREATININE 0.82 12/04/2012 1732   CALCIUM 8.8 (L) 07/08/2020 1400   PROT 7.2 07/08/2020 1400   PROT 6.5 02/16/2017 1332   ALBUMIN 4.1 07/08/2020 1400   ALBUMIN 4.3 02/16/2017 1332   AST 27 07/08/2020 1400   ALT 28 07/08/2020 1400   ALKPHOS 51 07/08/2020 1400   BILITOT 0.2 (L) 07/08/2020 1400   GFRNONAA >60 07/08/2020 1400  GFRAA >60 04/15/2019 2145    No results found for: TOTALPROTELP, ALBUMINELP, A1GS, A2GS, BETS, BETA2SER, GAMS, MSPIKE, SPEI  Lab Results  Component Value Date   WBC 11.2 (H) 07/08/2020   NEUTROABS 7.0 07/08/2020   HGB 13.6 07/08/2020   HCT 40.4 07/08/2020   MCV 99.8 07/08/2020   PLT 333 07/08/2020    No results found for: LABCA2  No components found for: FIEPPI951  No results for input(s): INR in the last 168 hours.  No results found for: LABCA2  No results found for: OAC166  No results found for: AYT016  No results found for: WFU932  No results found for: CA2729  No components found for: HGQUANT  No results found for: CEA1 / No results found for: CEA1   No results found for: AFPTUMOR  No results found for: CHROMOGRNA  No results found for: KPAFRELGTCHN, LAMBDASER, KAPLAMBRATIO (kappa/lambda light chains)  No results found for: HGBA, HGBA2QUANT, HGBFQUANT, HGBSQUAN (Hemoglobinopathy evaluation)   No results found for: LDH  No results found for: IRON, TIBC, IRONPCTSAT (Iron and TIBC)  No results found for: FERRITIN  Urinalysis    Component Value Date/Time   COLORURINE YELLOW 09/18/2013 [place] 09/18/2013 1555   LABSPEC 1.014 09/18/2013 1555   PHURINE 8.0 09/18/2013 1555   GLUCOSEU NEG 09/18/2013 1555   HGBUR NEG 09/18/2013 1555   BILIRUBINUR small (A)  02/16/2017 1216   BILIRUBINUR small 12/04/2012 1615   KETONESUR trace (5) (A) 02/16/2017 1216   KETONESUR NEG 09/18/2013 1555   PROTEINUR negative 02/16/2017 1216   PROTEINUR NEG 09/18/2013 1555   UROBILINOGEN 0.2 02/16/2017 1216   UROBILINOGEN 0.2 09/18/2013 1555   NITRITE Negative 02/16/2017 1216   NITRITE NEG 09/18/2013 1555   LEUKOCYTESUR Negative 02/16/2017 1216     STUDIES: CT SOFT TISSUE NECK W CONTRAST  Result Date: 06/24/2020 CLINICAL DATA:  Right supraclavicular lump. History of thyroid cancer with thyroidectomy. EXAM: CT NECK WITH CONTRAST TECHNIQUE: Multidetector CT imaging of the neck was performed using the standard protocol following the bolus administration of intravenous contrast. CONTRAST:  72m ISOVUE-300 IOPAMIDOL (ISOVUE-300) INJECTION 61% COMPARISON:  Thyroid ultrasound 11/19/2019 FINDINGS: Pharynx and larynx: Normal. No mass or swelling. Salivary glands: No inflammation, mass, or stone. Thyroid: Postop total thyroidectomy with surgical clips in the soft tissues. No recurrent mass in the thyroid bed Lymph nodes: Right level 2 lymph nodes measure 8.3 mm and 9.9 mm. Right posterior lymph node in the mid neck measures 5.5 mm on axial image 55. Right supraclavicular palpable abnormality corresponds to 2 adjacent lymph nodes. The largest is 6.5 mm. Left level 2 lymph nodes adjacent to each other measure 10.2 mm and 9.9 mm in diameter. Multiple small 4-6 mm posterior lymph nodes on the left. Left lower posterior lymph node measures 5.5 mm. Vascular: Normal vascular enhancement. Limited intracranial: Negative Visualized orbits: Not imaged Mastoids and visualized paranasal sinuses: Mucosal edema in the left maxillary sinus with air-fluid level. Remaining sinuses clear. Mastoid clear. Skeleton: No acute abnormality. Upper chest: Lung apices clear bilaterally. Other: None IMPRESSION: Postop thyroidectomy for thyroid cancer. No recurrent mass in the thyroid bed Palpable abnormality  corresponds to a cluster of 2 lymph nodes in the right supraclavicular region. The largest of these measures 6.5 mm. This has heterogeneous enhancement. Prominent lymph nodes in the neck bilaterally. None of these are definitely pathologically enlarged however given the history of thyroid cancer, close follow-up recommended. Consider fine-needle aspiration biopsy for further evaluation. Correlate with thyroid function testing.  Electronically Signed   By: Franchot Gallo M.D.   On: 06/24/2020 15:34   CT CHEST W CONTRAST  Result Date: 06/24/2020 CLINICAL DATA:  RIGHT supraclavicular lump by report in a patient with history of prior thyroid neoplasm. EXAM: CT CHEST WITH CONTRAST TECHNIQUE: Multidetector CT imaging of the chest was performed during intravenous contrast administration. CONTRAST:  84m ISOVUE-300 IOPAMIDOL (ISOVUE-300) INJECTION 61% COMPARISON:  None aside from prior abdominal imaging from 2012 and a CT of the neck acquired on today's date. FINDINGS: Cardiovascular: Normal caliber thoracic aorta. Normal heart size. No pericardial effusion. Normal caliber central pulmonary vessels. Mediastinum/Nodes: Post thyroidectomy, area of concern not seen on the chest CT based on the gel cap over the RIGHT supraclavicular region which is imaged on the CT of the neck, please see this CT for further detail. 11 mm RIGHT axillary lymph node and a cluster of small RIGHT sided lymph nodes in the RIGHT axilla. Enhancing 1.4 cm lesion in the medial RIGHT breast. (Image 25, series 3) no internal mammary lymphadenopathy. No mediastinal lymphadenopathy. Mild fullness of RIGHT hilar nodal tissue, the largest measuring approximately 1 cm on image 31 of series 3 Lungs/Pleura: Minimal parenchymal scarring in the chest. No effusion. Airways are patent. No suspicious pulmonary nodules. Upper Abdomen: 1.2 cm lesion in the LEFT hepatic lobe with central hyperattenuation that is close to blood pool. Hepatic steatosis. Imaged portions  of upper abdominal contents otherwise unremarkable. Musculoskeletal: No acute musculoskeletal process. No destructive bone finding. Sclerotic lesion in the midthoracic spine with spiculated margins at the T7 level favored to represent a bone island IMPRESSION: 1. Enhancing 1.4 cm lesion in the medial RIGHT breast with associated mildly enlarged RIGHT axillary lymph nodes. Findings are concerning for breast cancer with nodal metastatic disease. Further evaluation with mammography and ultrasound is suggested for further evaluation. 2. 1.2 cm lesion in the LEFT hepatic lobe with central hyperattenuation that is close to blood pool. This may represent a hemangioma. However, given patient history of thyroid cancer, hypervascular metastatic lesion could potentially have a similar appearance. Hepatic MRI may be helpful for further evaluation. 3. Also with enlargement of RIGHT infrahilar and RIGHT hilar nodal tissue that while borderline is suspicious in light of other findings. 4. Sclerotic lesion in the midthoracic spine with spiculated margins at the T7 level favored to represent a bone island. Attention on follow-up is suggested. 5. Post thyroidectomy, area of concern not seen on the chest CT based on the gel cap over the RIGHT supraclavicular region which is imaged on the CT of the neck, please see this CT of the neck, please see this CT of the neck for further detail. 6. Marked hepatic steatosis These results will be called to the ordering clinician or representative by the Radiologist Assistant, and communication documented in the PACS or [employer] Electronically Signed   By: GZetta BillsM.D.   On: 06/24/2020 16:36   MR BREAST BILATERAL W WO CONTRAST INC CAD  Result Date: 07/07/2020 CLINICAL DATA:  35year old female with diagnosis of right breast cancer which was originally found on a chest CT performed 06/24/2020. She has family history of breast cancer in her mother and maternal grandmother. She had  2 ultrasound-guided biopsies of masses in the medial right breast, both demonstrating invasive ductal carcinoma. She also had a right axillary mass biopsied which showed invasive ductal carcinoma with no residual nodal tissue, which could represent an additional primary focus or an entirely replaced metastatic lymph node. A biopsy of the  left breast was benign. She presents for staging MRI prior to initiating neoadjuvant chemotherapy. LABS:  None. EXAM: BILATERAL BREAST MRI WITH AND WITHOUT CONTRAST TECHNIQUE: Multiplanar, multisequence MR images of both breasts were obtained prior to and following the intravenous administration of 20 mL of MultiHance Three-dimensional MR images were rendered by post-processing of the original MR data on an independent workstation. The three-dimensional MR images were interpreted, and findings are reported in the following complete MRI report for this study. Three dimensional images were evaluated at the independent interpreting workstation using the DynaCAD thin client. COMPARISON:  No prior MRI available for comparison. Correlation made with prior mammogram and ultrasound images. FINDINGS: Breast composition: c. Heterogeneous fibroglandular tissue. Background parenchymal enhancement: Mild. Right breast: In the slightly lower inner quadrant of the right breast, middle depth, there is a round enhancing mass measuring 1.6 cm (series 8, image 65). Susceptibility artifact from the biopsy marking clip is seen along the medial aspect of the mass. Superior and medial to the mass, there is an area of non mass enhancement (series 8, image 62). This corresponds with the asymmetry and calcifications identified mammographically. The mass and non mass enhancement together measure approximately 5.4 x 3.0 x 4.0 cm. Susceptibility artifact is seen within the region of non mass enhancement, corresponding to the second site of biopsy demonstrating malignancy. Left breast: No mass or abnormal  enhancement. Susceptibility artifact from the benign biopsy is identified in the lower outer quadrant of the left breast. Lymph nodes: There are numerous asymmetrically enlarged right axillary lymph nodes (at least 8). There is one 4 mm internal mammary lymph node (series 7, image 49). Ancillary findings:  None. IMPRESSION: 1. There is an enhancing mass with adjacent non mass enhancement in the lower-inner right breast, which together measure 5.4 cm. The mass and non mass enhancement correspond with the 2 biopsy-proven areas of malignancy. 2. No other suspicious enhancement is identified in the right breast. 3. There is one 4 mm right internal mammary lymph node with unclear significance as there are no prior MRIs to compare for stability. 4.  Numerous abnormal right axillary lymph nodes. 5.  No evidence of left breast malignancy. RECOMMENDATION: 1. Review of the surgical notes indicates that the patient is currently planning neoadjuvant chemotherapy followed by mastectomy. If this remains the case, no additional biopsies are necessary. However, if the patient decides to pursue breast conservation, then additional biopsies as described in the initial diagnostic report would be recommended. If those are benign, and breast conservation is desired, than MRI biopsy of distant areas of non mass enhancement in the medial right breast would be recommended to denote extent of disease prior to initiation of neoadjuvant chemotherapy. BI-RADS CATEGORY  6: Known biopsy-proven malignancy. Electronically Signed   By: Ammie Ferrier M.D.   On: 07/07/2020 14:32     ELIGIBLE FOR AVAILABLE RESEARCH PROTOCOL:   ASSESSMENT: 35 y.o. [place] woman with a clinical mT2 N3, stage IIIA invasive ductal carcinoma, grade 1, triple positive  (a) breast MRI 07/06/2020 finds a 1.6 cm lower inner quadrant mass associated with 5.4 cm of non-mass-like enhancement, at least 8 enlarged right axillary lymph node and 1 right internal mammary  lymph node  (b) CT scans of the neck and chest 06/24/2020 shows subcentimeter right cervical adenopathy, 2 right supraclavicular lymph nodes the larger 0.65 cm, also left cervical adenopathy largest measuring 1.02 cm; there is also a 1.2 cm lesion in the left hepatic lobe, and a sclerotic lesion in the mid thoracic  spine at T7  (c) PET scan  (1) status post total thyroidectomy and central compartment lymph node biopsy 04/12/2019 for a pT1a pN0 medullary thyroid carcinoma, with negative margins, no angioinvasion, and no extrathyroidal extension  (a) CEA and calcitonin   (2) genetics testing 10/27/2018 showed   (a) a pathogenic variant in RET called c.1597G>T (p.Gly533cys), associated with MEN2A (medullary thyroid carcinoma, pheochromocytoma, parathyroid adenoma or hyperplasia)  (b) a likely pathogenic variant in CHEK2 c.349A>G (p.Arg117Gly) (breast and colon cancer and possibly prostate)  (3) neoadjuvant chemotherapy will consist of carboplatin, docetaxel, trastuzumab, and pertuzumab q. 21 days x 6, starting 07/23/2020  (4) anti-HER2 immunotherapy to continue to total 1 year  (5) definitive surgery to follow  (6) adjuvant radiation  (7) antiestrogens  PLAN: I met today with Kayla Little to review her new diagnosis. Specifically we discussed the biology of her breast cancer, its diagnosis, staging, treatment  options and prognosis. We first reviewed the fact that cancer is not one disease but more than 100 different diseases and that it is important to keep them separate-- otherwise when friends and relatives discuss their own cancer experiences with [facility] confusion can result. Similarly we explained that if breast cancer spreads to the bone or liver, the patient would not have bone cancer or liver cancer, but breast cancer in the bone and breast cancer in the liver: one cancer in three places-- not 3 different cancers which otherwise would have to be treated in 3 different ways.  We discussed the  difference between local and systemic therapy. In terms of loco-regional treatment, lumpectomy plus radiation is equivalent to mastectomy as far as survival is concerned.  However given the extent of disease on the right and the fact that the patient carries a pathogenic CHEK mutation the patient wishes to proceed with bilateral mastectomy and I support that decision.   We also noted that in terms of sequencing of treatments, whether systemic therapy or surgery is done first does not affect the ultimate outcome.  In Kayla Little's case we recommend starting with chemoimmunotherapy since this will give [place] prognostic information based on the tumor response.  We then discussed the rationale for systemic therapy. There is significant risk that this cancer may have already spread to other parts of Kayla Little's body.  In stage III disease like hers it is standard to do staging studies.  The ones that we have done so far are listed above.  They raise several questions including whether the cervical/supraclavicular adenopathy is "hot" and whether the Boline and liver lesion represent metastases.  A PET scan will help [place] answer these question and has been ordered.  Next we went over the options for systemic therapy which are anti-estrogens, anti-HER-2 immunotherapy, and chemotherapy. Elsy meets criteria for all 3 and that accordingly is the planned  More specifically she will start with carboplatin, docetaxel, trastuzumab and pertuzumab, then proceed to definitive surgery.  The anti-HER2 treatment will be continued to total 1 year.  After surgery she will benefit from adjuvant radiation.  Once radiation has been completed she will start antiestrogen therapy.  Today we discussed the possible toxicities side effects and complications of these agents.  She understands the risk of permanent alopecia.  We discussed the possible use of the Digna cap system.  We discussed possible beginning of the heart muscle, and possibly permanent  peripheral neuropathy and loss of fertility among other concerns.  Kayla Little is very eager to proceed.  She is already scheduled for port placement on 07/17/2020.  We are scheduling her to start chemotherapy here on 07/23/2020.  She will see our chemo teaching nurse prior to that date  Kamani has a good understanding of the overall plan. She agrees with it. She knows the goal of treatment in her case is cure. She will call with any problems that may develop before her next visit here.  Total encounter time 70 minutes.Sarajane Jews C. Rease Swinson, MD 07/08/2020 5:36 PM Medical Oncology and Hematology [facility] [place],  [id number] Tel. [phone]    Fax. [phone]   This document serves as a record of services personally performed by Lurline Del, MD. It was created on his behalf by Wilburn Mylar, a trained medical scribe. The creation of this record is based on the scribe's personal observations and the provider's statements to them.   I, Lurline Del MD, have reviewed the above documentation for accuracy and completeness, and I agree with the above.   *Total Encounter Time as defined by the Centers for Medicare and Medicaid Services includes, in addition to the face-to-face time of a patient visit (documented in the note above) non-face-to-face time: obtaining and reviewing outside history, ordering and reviewing medications, tests or procedures, care coordination (communications with other health care professionals or caregivers) and documentation in the medical record.

## 2020-07-08 ENCOUNTER — Ambulatory Visit
Admission: RE | Admit: 2020-07-08 | Discharge: 2020-07-08 | Disposition: A | Discharge status: Home / Self Care | Payer: 59 | Source: Ambulatory Visit | Attending: Radiation Oncology | Admitting: Radiation Oncology

## 2020-07-08 ENCOUNTER — Other Ambulatory Visit: Payer: 59

## 2020-07-08 ENCOUNTER — Inpatient Hospital Stay: Payer: 59 | Attending: Oncology

## 2020-07-08 ENCOUNTER — Other Ambulatory Visit: Payer: Self-pay

## 2020-07-08 ENCOUNTER — Encounter: Payer: Self-pay | Admitting: Radiation Oncology

## 2020-07-08 ENCOUNTER — Inpatient Hospital Stay (HOSPITAL_BASED_OUTPATIENT_CLINIC_OR_DEPARTMENT_OTHER): Payer: 59 | Admitting: Oncology

## 2020-07-08 VITALS — BP 128/87 | HR 90 | Temp 96.9°F | Resp 18 | Ht 66.0 in | Wt 164.5 lb

## 2020-07-08 VITALS — BP 134/88 | HR 82 | Temp 97.9°F | Resp 18 | Ht 66.0 in | Wt 163.7 lb

## 2020-07-08 DIAGNOSIS — Z791 Long term (current) use of non-steroidal anti-inflammatories (NSAID): Secondary | ICD-10-CM | POA: Insufficient documentation

## 2020-07-08 DIAGNOSIS — Z87442 Personal history of urinary calculi: Secondary | ICD-10-CM | POA: Diagnosis not present

## 2020-07-08 DIAGNOSIS — Z8041 Family history of malignant neoplasm of ovary: Secondary | ICD-10-CM | POA: Insufficient documentation

## 2020-07-08 DIAGNOSIS — Z85850 Personal history of malignant neoplasm of thyroid: Secondary | ICD-10-CM | POA: Insufficient documentation

## 2020-07-08 DIAGNOSIS — R590 Localized enlarged lymph nodes: Secondary | ICD-10-CM | POA: Insufficient documentation

## 2020-07-08 DIAGNOSIS — Z5111 Encounter for antineoplastic chemotherapy: Secondary | ICD-10-CM | POA: Insufficient documentation

## 2020-07-08 DIAGNOSIS — Z170 Estrogen receptor positive status [ER+]: Secondary | ICD-10-CM | POA: Diagnosis not present

## 2020-07-08 DIAGNOSIS — F988 Other specified behavioral and emotional disorders with onset usually occurring in childhood and adolescence: Secondary | ICD-10-CM | POA: Diagnosis not present

## 2020-07-08 DIAGNOSIS — E3120 Multiple endocrine neoplasia [MEN] syndrome, unspecified: Secondary | ICD-10-CM | POA: Insufficient documentation

## 2020-07-08 DIAGNOSIS — Z806 Family history of leukemia: Secondary | ICD-10-CM | POA: Diagnosis not present

## 2020-07-08 DIAGNOSIS — Z79899 Other long term (current) drug therapy: Secondary | ICD-10-CM | POA: Insufficient documentation

## 2020-07-08 DIAGNOSIS — Z5112 Encounter for antineoplastic immunotherapy: Secondary | ICD-10-CM | POA: Diagnosis present

## 2020-07-08 DIAGNOSIS — F17210 Nicotine dependence, cigarettes, uncomplicated: Secondary | ICD-10-CM | POA: Insufficient documentation

## 2020-07-08 DIAGNOSIS — Z1509 Genetic susceptibility to other malignant neoplasm: Secondary | ICD-10-CM

## 2020-07-08 DIAGNOSIS — L719 Rosacea, unspecified: Secondary | ICD-10-CM | POA: Insufficient documentation

## 2020-07-08 DIAGNOSIS — Z803 Family history of malignant neoplasm of breast: Secondary | ICD-10-CM | POA: Insufficient documentation

## 2020-07-08 DIAGNOSIS — Z1589 Genetic susceptibility to other disease: Secondary | ICD-10-CM

## 2020-07-08 DIAGNOSIS — C50211 Malignant neoplasm of upper-inner quadrant of right female breast: Secondary | ICD-10-CM | POA: Insufficient documentation

## 2020-07-08 DIAGNOSIS — Z1502 Genetic susceptibility to malignant neoplasm of ovary: Secondary | ICD-10-CM | POA: Diagnosis not present

## 2020-07-08 DIAGNOSIS — C50919 Malignant neoplasm of unspecified site of unspecified female breast: Secondary | ICD-10-CM

## 2020-07-08 DIAGNOSIS — Z1501 Genetic susceptibility to malignant neoplasm of breast: Secondary | ICD-10-CM | POA: Insufficient documentation

## 2020-07-08 DIAGNOSIS — R197 Diarrhea, unspecified: Secondary | ICD-10-CM | POA: Insufficient documentation

## 2020-07-08 LAB — CMP (CANCER CENTER ONLY)
ALT: 28 U/L (ref 0–44)
AST: 27 U/L (ref 15–41)
Albumin: 4.1 g/dL (ref 3.5–5.0)
Alkaline Phosphatase: 51 U/L (ref 38–126)
Anion gap: 11 (ref 5–15)
BUN: 8 mg/dL (ref 6–20)
CO2: 25 mmol/L (ref 22–32)
Calcium: 8.8 mg/dL — ABNORMAL LOW (ref 8.9–10.3)
Chloride: 103 mmol/L (ref 98–111)
Creatinine: 0.91 mg/dL (ref 0.44–1.00)
GFR, Estimated: >60 mL/min (ref >60)
Glucose, Bld: 101 mg/dL — ABNORMAL HIGH (ref 70–99)
Potassium: 3.9 mmol/L (ref 3.5–5.1)
Sodium: 139 mmol/L (ref 135–145)
Total Bilirubin: 0.2 mg/dL — ABNORMAL LOW (ref 0.3–1.2)
Total Protein: 7.2 g/dL (ref 6.5–8.1)

## 2020-07-08 LAB — CBC WITH DIFFERENTIAL (CANCER CENTER ONLY)
Abs Immature Granulocytes: 0.04 10*3/uL (ref 0.00–0.07)
Basophils Absolute: 0.1 10*3/uL (ref 0.0–0.1)
Basophils Relative: 1 %
Eosinophils Absolute: 0.1 10*3/uL (ref 0.0–0.5)
Eosinophils Relative: 1 %
HCT: 40.4 % (ref 36.0–46.0)
Hemoglobin: 13.6 g/dL (ref 12.0–15.0)
Immature Granulocytes: 0 %
Lymphocytes Relative: 28 %
Lymphs Abs: 3.1 10*3/uL (ref 0.7–4.0)
MCH: 33.6 pg (ref 26.0–34.0)
MCHC: 33.7 g/dL (ref 30.0–36.0)
MCV: 99.8 fL (ref 80.0–100.0)
Monocytes Absolute: 0.9 10*3/uL (ref 0.1–1.0)
Monocytes Relative: 8 %
Neutro Abs: 7 10*3/uL (ref 1.7–7.7)
Neutrophils Relative %: 62 %
Platelet Count: 333 10*3/uL (ref 150–400)
RBC: 4.05 MIL/uL (ref 3.87–5.11)
RDW: 12.8 % (ref 11.5–15.5)
WBC Count: 11.2 10*3/uL — ABNORMAL HIGH (ref 4.0–10.5)
nRBC: 0 % (ref 0.0–0.2)

## 2020-07-08 MED ORDER — PROCHLORPERAZINE MALEATE 10 MG PO TABS: 10.0000 mg | ORAL_TABLET | Freq: Four times a day (QID) | ORAL | 1 refills | Status: DC | PRN

## 2020-07-08 MED ORDER — LORATADINE 10 MG PO TABS: 10.0000 mg | ORAL_TABLET | Freq: Every day | ORAL | 0 refills | Status: DC

## 2020-07-08 MED ORDER — DEXAMETHASONE 4 MG PO TABS: 8.0000 mg | ORAL_TABLET | Freq: Two times a day (BID) | ORAL | 1 refills | Status: DC

## 2020-07-08 MED ORDER — LIDOCAINE-PRILOCAINE 2.5-2.5 % EX CREA: TOPICAL_CREAM | CUTANEOUS | 3 refills | Status: DC

## 2020-07-08 MED ORDER — ONDANSETRON HCL 8 MG PO TABS: 8.0000 mg | ORAL_TABLET | Freq: Two times a day (BID) | ORAL | 1 refills | Status: DC | PRN

## 2020-07-08 NOTE — Progress Notes (Addendum)
Radiation Oncology         [phone] ________________________________  Name: Kayla Little        MRN: [id number]  Date of Service: 07/08/2020 DOB: [birth year]  AS:[id number], [facility] [place]    REFERRING PHYSICIAN: Dr. Marlou Starks  DIAGNOSIS: The primary encounter diagnosis was Monoallelic mutation of CHEK2 gene in female patient. A diagnosis of Malignant neoplasm of upper-inner quadrant of right breast in female, estrogen receptor positive ([place]) was also pertinent to this visit.   HISTORY OF PRESENT ILLNESS: Kayla Little is a 35 y.o. female seen on behalf of Dr. Marlou Starks with a new diagnosis of right breast cancer. The patient presented with a palpable mass in the right breast.  The patient presented for further evaluation and outside imaging revealed an irregular mass in the inner right breast measuring up to 2.6 cm with associated microcalcifications spanning up to 5.1 cm.  There was a 9 mm lesion in the inner right breast, and 2 indeterminate masses in the outer breast totaling 4 sites.  There were at least 3 abnormal appearing lymph nodes in her right axilla.  There was also an ultrasound performed of the left breast showing a 1.3 cm mass in the lower outer left breast possibly a fibroadenoma and no evidence of adenopathy in the left axilla.  She underwent biopsies on 06/30/2020, her left breast biopsy was benign showing fibrocystic change, her right breast dominant lesion was a grade 1 invasive ductal carcinoma, her second right breast biopsy the smaller more medial lesion 4 cm from the nipple was also a grade 1 invasive ductal carcinoma, and an axillary lymph node was also positive and consistent with grade 1 invasive ductal carcinoma.  The tumors were ER/PR positive HER2 was amplified, and an MRI on 07/06/2020 showed the known right breast mass and associated nonmass enhancement measuring 5.4 cm in total. Numerous enlarged nodes in the right axilla, at least 8 in total and one internal mammary  node were seen on the right. The left breast and axilla were negative for findings. She is planning to meet with Dr. Jana Hakim this afternoon as well, and Dr. Marlou Starks has recommended neoadjuvant chemotherapy followed by surgery to be determined, and given her lymph node involvement she is seen to discuss adjuvant radiotherapy.   PREVIOUS RADIATION THERAPY: No   PAST MEDICAL HISTORY:  Past Medical History:  Diagnosis Date  . ADD (attention deficit disorder)   . Breast lump    R breast  . Depression   . Family history of breast cancer   . Family history of lymphoma   . Family history of ovarian cancer   . Family history of prostate cancer   . 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 Hx of varicella   . Kidney stone 2012  . Medullary thyroid carcinoma ([place])   . Monoallelic mutation of CHEK2 gene in female patient   . Postpartum care following vaginal delivery (7/27) 10/02/2014, June 16 , 2020       PAST SURGICAL HISTORY: Past Surgical History:  Procedure Laterality Date  . NO PAST SURGERIES    . THYROIDECTOMY N/A 04/12/2019   Procedure: TOTAL THYROIDECTOMY WITH LIMITED LYMPH NODE DISSECTION;  Surgeon: Armandina Gemma, MD;  Location: [facility];  Service: General;  Laterality: N/A;  . UNILATERAL SALPINGECTOMY Right 07/09/2017   Procedure: RIGHT SALPINGECTOMY WITH REMOVAL OF ECTOPIC PREGNANCY;  Surgeon: Azucena Fallen, MD;  Location: [facility];  Service: Gynecology;  Laterality: Right;  FAMILY HISTORY:  Family History  Problem Relation Age of Onset  . Breast cancer Mother 29       2 types of cancer one side mastectomy with chemo  . Endometriosis Mother   . Endometriosis Maternal Grandmother   . Breast cancer Maternal Grandmother        post menopausal  . Diabetes Maternal Grandmother   . Cancer Maternal Grandfather        prostate and lymphoma  . Alzheimer's disease Paternal Grandmother   . Cancer Father 25       prostate cancer  . Aneurysm Paternal  Grandfather        d. 17s     SOCIAL HISTORY:  reports that she has been smoking cigarettes. She has a 2.50 pack-year smoking history. She has never used smokeless tobacco. She reports current alcohol use of about 7.0 - 14.0 standard drinks of alcohol per week. She reports current drug use. Drug: Marijuana. The patient is married and lives in [place]. Her mother Butch Penny accompanies her. The patient has a 37 year old and a 37 year old. She works in an [employer] firm.    ALLERGIES: Patient has no known allergies.   MEDICATIONS:  Current Outpatient Medications  Medication Sig Dispense Refill  . acetaminophen (TYLENOL) 325 MG tablet Take 2 tablets (650 mg total) by mouth every 4 (four) hours as needed (for pain scale < 4). 100 tablet 0  . calcium carbonate (TUMS) 500 MG chewable tablet Chew 2 tablets (400 mg of elemental calcium total) by mouth 2 (two) times daily. 90 tablet 1  . ibuprofen (ADVIL) 600 MG tablet Take 1 tablet (600 mg total) by mouth every 6 (six) hours. (Patient not taking: Reported on 04/02/2019) 30 tablet 0  . levonorgestrel (MIRENA) 20 MCG/24HR IUD 1 each by Intrauterine route once.    Marland Kitchen levothyroxine (SYNTHROID) 100 MCG tablet Take 1 tablet (100 mcg total) by mouth daily. 30 tablet 3  . oxyCODONE (OXY IR/ROXICODONE) 5 MG immediate release tablet Take 1-2 tablets (5-10 mg total) by mouth every 4 (four) hours as needed for moderate pain. 15 tablet 0  . sertraline (ZOLOFT) 50 MG tablet Take 50 mg by mouth at bedtime.     Marland Kitchen VYVANSE 20 MG capsule Take 20 mg by mouth daily.     No current facility-administered medications for this encounter.     REVIEW OF SYSTEMS: On review of systems, the patient reports that she is doing well overall. She is doing okay. Her mother's history of breast cancer has been in some ways helpful for the patient to navigate her own care.   PHYSICAL EXAM:  Wt Readings from Last 3 Encounters:  04/15/19 160 lb (72.6 kg)  04/12/19 161 lb 12.8 oz (73.4  kg)  04/10/19 160 lb (72.6 kg)   Temp Readings from Last 3 Encounters:  04/15/19 98 F (36.7 C) (Oral)  04/13/19 98.2 F (36.8 C)  04/10/19 98.3 F (36.8 C) (Oral)   BP Readings from Last 3 Encounters:  04/16/19 (!) 130/92  04/13/19 110/82  04/10/19 128/86   Pulse Readings from Last 3 Encounters:  04/16/19 (!) 46  04/13/19 70  04/10/19 83    In general this is a well appearing caucasian female in no acute distress. She's alert and oriented x4 and appropriate throughout the examination. Cardiopulmonary assessment is negative for acute distress and she exhibits normal effort. Bilateral breast exam is deferred.    ECOG = 0  0 - Asymptomatic (Fully active, able  to carry on all predisease activities without restriction)  1 - Symptomatic but completely ambulatory (Restricted in physically strenuous activity but ambulatory and able to carry out work of a light or sedentary nature. For example, light housework, office work)  2 - Symptomatic, <50% in bed during the day (Ambulatory and capable of all self care but unable to carry out any work activities. Up and about more than 50% of waking hours)  3 - Symptomatic, >50% in bed, but not bedbound (Capable of only limited self-care, confined to bed or chair 50% or more of waking hours)  4 - Bedbound (Completely disabled. Cannot carry on any self-care. Totally confined to bed or chair)  5 - Death   Eustace Pen MM, Creech RH, Tormey DC, et al. [phone]). "Toxicity and response criteria of the [facility]". [place] Oncol. 5 (6): 649-55    LABORATORY DATA:  Lab Results  Component Value Date   WBC 7.2 04/15/2019   HGB 12.6 04/15/2019   HCT 37.0 04/15/2019   MCV 102.6 (H) 04/15/2019   PLT 253 04/15/2019   Lab Results  Component Value Date   NA 139 04/15/2019   K 3.7 04/15/2019   CL 101 04/15/2019   CO2 25 04/15/2019   Lab Results  Component Value Date   ALT 17 02/16/2017   AST 16 02/16/2017   ALKPHOS 37  (L) 02/16/2017   BILITOT 0.4 02/16/2017      RADIOGRAPHY: CT SOFT TISSUE NECK W CONTRAST  Result Date: 06/24/2020 CLINICAL DATA:  Right supraclavicular lump. History of thyroid cancer with thyroidectomy. EXAM: CT NECK WITH CONTRAST TECHNIQUE: Multidetector CT imaging of the neck was performed using the standard protocol following the bolus administration of intravenous contrast. CONTRAST:  38m ISOVUE-300 IOPAMIDOL (ISOVUE-300) INJECTION 61% COMPARISON:  Thyroid ultrasound 11/19/2019 FINDINGS: Pharynx and larynx: Normal. No mass or swelling. Salivary glands: No inflammation, mass, or stone. Thyroid: Postop total thyroidectomy with surgical clips in the soft tissues. No recurrent mass in the thyroid bed Lymph nodes: Right level 2 lymph nodes measure 8.3 mm and 9.9 mm. Right posterior lymph node in the mid neck measures 5.5 mm on axial image 55. Right supraclavicular palpable abnormality corresponds to 2 adjacent lymph nodes. The largest is 6.5 mm. Left level 2 lymph nodes adjacent to each other measure 10.2 mm and 9.9 mm in diameter. Multiple small 4-6 mm posterior lymph nodes on the left. Left lower posterior lymph node measures 5.5 mm. Vascular: Normal vascular enhancement. Limited intracranial: Negative Visualized orbits: Not imaged Mastoids and visualized paranasal sinuses: Mucosal edema in the left maxillary sinus with air-fluid level. Remaining sinuses clear. Mastoid clear. Skeleton: No acute abnormality. Upper chest: Lung apices clear bilaterally. Other: None IMPRESSION: Postop thyroidectomy for thyroid cancer. No recurrent mass in the thyroid bed Palpable abnormality corresponds to a cluster of 2 lymph nodes in the right supraclavicular region. The largest of these measures 6.5 mm. This has heterogeneous enhancement. Prominent lymph nodes in the neck bilaterally. None of these are definitely pathologically enlarged however given the history of thyroid cancer, close follow-up recommended. Consider  fine-needle aspiration biopsy for further evaluation. Correlate with thyroid function testing. Electronically Signed   By: CFranchot GalloM.D.   On: 06/24/2020 15:34   CT CHEST W CONTRAST  Result Date: 06/24/2020 CLINICAL DATA:  RIGHT supraclavicular lump by report in a patient with history of prior thyroid neoplasm. EXAM: CT CHEST WITH CONTRAST TECHNIQUE: Multidetector CT imaging of the chest was performed during intravenous contrast  administration. CONTRAST:  39m ISOVUE-300 IOPAMIDOL (ISOVUE-300) INJECTION 61% COMPARISON:  None aside from prior abdominal imaging from 2012 and a CT of the neck acquired on today's date. FINDINGS: Cardiovascular: Normal caliber thoracic aorta. Normal heart size. No pericardial effusion. Normal caliber central pulmonary vessels. Mediastinum/Nodes: Post thyroidectomy, area of concern not seen on the chest CT based on the gel cap over the RIGHT supraclavicular region which is imaged on the CT of the neck, please see this CT for further detail. 11 mm RIGHT axillary lymph node and a cluster of small RIGHT sided lymph nodes in the RIGHT axilla. Enhancing 1.4 cm lesion in the medial RIGHT breast. (Image 25, series 3) no internal mammary lymphadenopathy. No mediastinal lymphadenopathy. Mild fullness of RIGHT hilar nodal tissue, the largest measuring approximately 1 cm on image 31 of series 3 Lungs/Pleura: Minimal parenchymal scarring in the chest. No effusion. Airways are patent. No suspicious pulmonary nodules. Upper Abdomen: 1.2 cm lesion in the LEFT hepatic lobe with central hyperattenuation that is close to blood pool. Hepatic steatosis. Imaged portions of upper abdominal contents otherwise unremarkable. Musculoskeletal: No acute musculoskeletal process. No destructive bone finding. Sclerotic lesion in the midthoracic spine with spiculated margins at the T7 level favored to represent a bone island IMPRESSION: 1. Enhancing 1.4 cm lesion in the medial RIGHT breast with associated  mildly enlarged RIGHT axillary lymph nodes. Findings are concerning for breast cancer with nodal metastatic disease. Further evaluation with mammography and ultrasound is suggested for further evaluation. 2. 1.2 cm lesion in the LEFT hepatic lobe with central hyperattenuation that is close to blood pool. This may represent a hemangioma. However, given patient history of thyroid cancer, hypervascular metastatic lesion could potentially have a similar appearance. Hepatic MRI may be helpful for further evaluation. 3. Also with enlargement of RIGHT infrahilar and RIGHT hilar nodal tissue that while borderline is suspicious in light of other findings. 4. Sclerotic lesion in the midthoracic spine with spiculated margins at the T7 level favored to represent a bone island. Attention on follow-up is suggested. 5. Post thyroidectomy, area of concern not seen on the chest CT based on the gel cap over the RIGHT supraclavicular region which is imaged on the CT of the neck, please see this CT of the neck, please see this CT of the neck for further detail. 6. Marked hepatic steatosis These results will be called to the ordering clinician or representative by the Radiologist Assistant, and communication documented in the PACS or [employer] Electronically Signed   By: GZetta BillsM.D.   On: 06/24/2020 16:36   MR BREAST BILATERAL W WO CONTRAST INC CAD  Result Date: 07/07/2020 CLINICAL DATA:  35year old female with diagnosis of right breast cancer which was originally found on a chest CT performed 06/24/2020. She has family history of breast cancer in her mother and maternal grandmother. She had 2 ultrasound-guided biopsies of masses in the medial right breast, both demonstrating invasive ductal carcinoma. She also had a right axillary mass biopsied which showed invasive ductal carcinoma with no residual nodal tissue, which could represent an additional primary focus or an entirely replaced metastatic lymph node. A  biopsy of the left breast was benign. She presents for staging MRI prior to initiating neoadjuvant chemotherapy. LABS:  None. EXAM: BILATERAL BREAST MRI WITH AND WITHOUT CONTRAST TECHNIQUE: Multiplanar, multisequence MR images of both breasts were obtained prior to and following the intravenous administration of 20 mL of MultiHance Three-dimensional MR images were rendered by post-processing of the original MR  data on an independent workstation. The three-dimensional MR images were interpreted, and findings are reported in the following complete MRI report for this study. Three dimensional images were evaluated at the independent interpreting workstation using the DynaCAD thin client. COMPARISON:  No prior MRI available for comparison. Correlation made with prior mammogram and ultrasound images. FINDINGS: Breast composition: c. Heterogeneous fibroglandular tissue. Background parenchymal enhancement: Mild. Right breast: In the slightly lower inner quadrant of the right breast, middle depth, there is a round enhancing mass measuring 1.6 cm (series 8, image 65). Susceptibility artifact from the biopsy marking clip is seen along the medial aspect of the mass. Superior and medial to the mass, there is an area of non mass enhancement (series 8, image 62). This corresponds with the asymmetry and calcifications identified mammographically. The mass and non mass enhancement together measure approximately 5.4 x 3.0 x 4.0 cm. Susceptibility artifact is seen within the region of non mass enhancement, corresponding to the second site of biopsy demonstrating malignancy. Left breast: No mass or abnormal enhancement. Susceptibility artifact from the benign biopsy is identified in the lower outer quadrant of the left breast. Lymph nodes: There are numerous asymmetrically enlarged right axillary lymph nodes (at least 8). There is one 4 mm internal mammary lymph node (series 7, image 49). Ancillary findings:  None. IMPRESSION: 1.  There is an enhancing mass with adjacent non mass enhancement in the lower-inner right breast, which together measure 5.4 cm. The mass and non mass enhancement correspond with the 2 biopsy-proven areas of malignancy. 2. No other suspicious enhancement is identified in the right breast. 3. There is one 4 mm right internal mammary lymph node with unclear significance as there are no prior MRIs to compare for stability. 4.  Numerous abnormal right axillary lymph nodes. 5.  No evidence of left breast malignancy. RECOMMENDATION: 1. Review of the surgical notes indicates that the patient is currently planning neoadjuvant chemotherapy followed by mastectomy. If this remains the case, no additional biopsies are necessary. However, if the patient decides to pursue breast conservation, then additional biopsies as described in the initial diagnostic report would be recommended. If those are benign, and breast conservation is desired, than MRI biopsy of distant areas of non mass enhancement in the medial right breast would be recommended to denote extent of disease prior to initiation of neoadjuvant chemotherapy. BI-RADS CATEGORY  6: Known biopsy-proven malignancy. Electronically Signed   By: Ammie Ferrier M.D.   On: 07/07/2020 14:32       IMPRESSION/PLAN: 1. Stage IIIA, [id number] grade 1, triple positive invasive ductal carcinoma of the right breast. Dr. Lisbeth Renshaw discusses the pathology findings and reviews the nature of locally advanced breast disease. Dr. Lisbeth Renshaw agrees with the recommendation for neoadjuvant radiotherapy. She will need an ALND due to her nodal invovlement, but her breast surgery is to be determined. At the completion of her surgery, we would plan to meet back to discuss external radiotherapy to the breast/chest wall as well as to the regional nodes  to reduce risks of local recurrence followed by antiestrogen therapy. We discussed the risks, benefits, short, and long term effects of radiotherapy, as well  as the curative intent, and the patient is interested in proceeding. Dr. Lisbeth Renshaw discusses the delivery and logistics of radiotherapy and anticipates a course of 6 1/2 weeks of radiotherapy to the regional nodes and breast/chest wall. We will see her back a few weeks after surgery to discuss the simulation process and anticipate we starting radiotherapy about 4-6  weeks after surgery.  2. Possible genetic predisposition to malignancy. The patient has already tested and has mutations involving CHEK2 and MEN2A. We will notify genetics of her diagnosis as well to make sure they're aware. 3. Contraceptive Counseling. The patient has a Mirena IUD. If she continues with this for contraception we will proceed with pregnancy testing prior to radiotherapy. 4. Social concerns. The patient has young children and would like to meet with social work to discuss how to begin these conversations. We will notfiy Garnet Koyanagi as well.   In a visit lasting 60 minutes, greater than 50% of the time was spent face to face reviewing her case, as well as in preparation of, discussing, and coordinating the patient's care.  The above documentation reflects my direct findings during this shared patient visit. Please see the separate note by Dr. Lisbeth Renshaw on this date for the remainder of the patient's plan of care.    Carola Rhine, [facility]    **Disclaimer: This note was dictated with voice recognition software. Similar sounding words can inadvertently be transcribed and this note may contain transcription errors which may not have been corrected upon publication of note.**

## 2020-07-08 NOTE — Progress Notes (Signed)
START ON PATHWAY REGIMEN - Breast     A cycle is every 21 days:     Pertuzumab      Pertuzumab      Trastuzumab-xxxx      Trastuzumab-xxxx      Carboplatin      Docetaxel   **Always confirm dose/schedule in your pharmacy ordering system**  Patient Characteristics: Preoperative or Nonsurgical Candidate (Clinical Staging), Neoadjuvant Therapy followed by Surgery, Invasive Disease, Chemotherapy, HER2 Positive, ER Positive Therapeutic Status: Preoperative or Nonsurgical Candidate (Clinical Staging) AJCC M Category: cM0 AJCC Grade: G1 Breast Surgical Plan: Neoadjuvant Therapy followed by Surgery ER Status: Positive (+) AJCC 8 Stage Grouping: IIIA HER2 Status: Positive (+) AJCC T Category: cT1c AJCC N Category: cN3 PR Status: Positive (+) Intent of Therapy: Curative Intent, Discussed with Patient

## 2020-07-09 ENCOUNTER — Telehealth: Payer: Self-pay | Admitting: Licensed Clinical Social Worker

## 2020-07-09 NOTE — Addendum Note (Signed)
Encounter addended by: Hayden Pedro, PA-C on: 07/09/2020 8:36 AM  Actions taken: SmartForm saved, Clinical Note Signed

## 2020-07-09 NOTE — Telephone Encounter (Signed)
[address] Work  Clinical Social Work was referred by [employer] / new pt protocol for assessment of psychosocial needs.  Clinical Social Worker attempted to contact patient by phone  to offer support and assess for needs.   No answer. Left generic VM with direct contact information.     [place], [place] Worker [employer]

## 2020-07-10 ENCOUNTER — Encounter: Payer: Self-pay | Admitting: Licensed Clinical Social Worker

## 2020-07-10 ENCOUNTER — Encounter (HOSPITAL_BASED_OUTPATIENT_CLINIC_OR_DEPARTMENT_OTHER): Payer: Self-pay | Admitting: General Surgery

## 2020-07-10 ENCOUNTER — Other Ambulatory Visit: Payer: Self-pay

## 2020-07-10 NOTE — Progress Notes (Signed)
Scissors Work  Clinical Social Work was referred by PA for assessment of psychosocial needs.  Clinical Social Worker contacted patient by phone  to offer support and assess for needs.    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 CSW discussed with patient and informed her of the resource "[employer]  Pt also asked about how to help her husband understand what is happening and plans to bring him to chemo ed class. CSW also provided information on caregiver group should he be interested.  Patient is interested in having an Alight guide (referral made) and even potentially being a guide when she finishes treatment.  CSW encouraged patient to call back with any other questions or needs.   [place], [place] Worker [employer]

## 2020-07-14 ENCOUNTER — Telehealth: Payer: Self-pay | Admitting: Oncology

## 2020-07-14 ENCOUNTER — Encounter: Payer: Self-pay | Admitting: *Deleted

## 2020-07-14 ENCOUNTER — Other Ambulatory Visit (HOSPITAL_COMMUNITY): Payer: 59

## 2020-07-14 ENCOUNTER — Telehealth: Payer: Self-pay | Admitting: *Deleted

## 2020-07-14 NOTE — Telephone Encounter (Signed)
Per 5/3 los, Left message

## 2020-07-14 NOTE — Telephone Encounter (Signed)
Spoke to pt, provided navigation resources and contact information. Denies questions or concerns regarding dx or treatment care plan. R/s chemo class to 5/13 per pt request to allow pt husband to attend. Discussed hair loss with chemo. Encourage pt to call with needs. Received verbal understanding.

## 2020-07-15 ENCOUNTER — Other Ambulatory Visit: Payer: Self-pay

## 2020-07-15 ENCOUNTER — Ambulatory Visit (INDEPENDENT_AMBULATORY_CARE_PROVIDER_SITE_OTHER): Payer: 59 | Admitting: Plastic Surgery

## 2020-07-15 ENCOUNTER — Encounter: Payer: Self-pay | Admitting: Plastic Surgery

## 2020-07-15 ENCOUNTER — Institutional Professional Consult (permissible substitution): Payer: 59 | Admitting: Plastic Surgery

## 2020-07-15 ENCOUNTER — Other Ambulatory Visit (HOSPITAL_COMMUNITY)
Admission: RE | Admit: 2020-07-15 | Discharge: 2020-07-15 | Disposition: A | Discharge status: Home / Self Care | Payer: 59 | Source: Ambulatory Visit | Attending: General Surgery | Admitting: General Surgery

## 2020-07-15 VITALS — BP 119/83 | HR 91 | Ht 66.0 in | Wt 163.2 lb

## 2020-07-15 DIAGNOSIS — Z1509 Genetic susceptibility to other malignant neoplasm: Secondary | ICD-10-CM

## 2020-07-15 DIAGNOSIS — Z20822 Contact with and (suspected) exposure to covid-19: Secondary | ICD-10-CM | POA: Insufficient documentation

## 2020-07-15 DIAGNOSIS — Z1501 Genetic susceptibility to malignant neoplasm of breast: Secondary | ICD-10-CM | POA: Diagnosis not present

## 2020-07-15 DIAGNOSIS — E3122 Multiple endocrine neoplasia [MEN] type IIA: Secondary | ICD-10-CM

## 2020-07-15 DIAGNOSIS — Z148 Genetic carrier of other disease: Secondary | ICD-10-CM

## 2020-07-15 DIAGNOSIS — E042 Nontoxic multinodular goiter: Secondary | ICD-10-CM

## 2020-07-15 DIAGNOSIS — C50211 Malignant neoplasm of upper-inner quadrant of right female breast: Secondary | ICD-10-CM

## 2020-07-15 DIAGNOSIS — Z1589 Genetic susceptibility to other disease: Secondary | ICD-10-CM

## 2020-07-15 DIAGNOSIS — Z1502 Genetic susceptibility to malignant neoplasm of ovary: Secondary | ICD-10-CM

## 2020-07-15 DIAGNOSIS — Z01812 Encounter for preprocedural laboratory examination: Secondary | ICD-10-CM | POA: Diagnosis not present

## 2020-07-15 DIAGNOSIS — Z170 Estrogen receptor positive status [ER+]: Secondary | ICD-10-CM

## 2020-07-15 LAB — SARS CORONAVIRUS 2 (TAT 6-24 HRS): SARS Coronavirus 2: NEGATIVE

## 2020-07-15 NOTE — Progress Notes (Signed)
Patient ID: Kayla Little, female    DOB: 11/04/1985, 35 y.o.   MRN: [id number]   Chief Complaint  Patient presents with  . Advice Only  . Breast Cancer    The patient is a 35 year old female here for consultation for breast reconstruction.  Patient underwent bilateral diagnostic mammogram and an ultrasound on 06/27/2020 which showed a breast density category D.  She had a 2.6 cm irregular mass in the inner right breast with 2 indeterminate masses in the outer breast and 3 enlarged lymph nodes in the right axilla.  The left breast had an indeterminate 1.2 cm mass in the lower outer breast possibly a fibroadenoma.  There were no enlarged lymph nodes.  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  It is estrogen and progesterone positive and HER2 positive.  The smaller mass was also invasive ductal carcinoma and ductal carcinoma in situ.  An MRI was done Jul 06, 2020 which showed breast composition C enhancing mass with adjacent non-mass enhancement in the lower inner right breast measuring 5.4 cm altogether.  She had what enlarged internal mammary lymph node on the right side.  No evidence of left breast malignancy.  The initial work-up started with supraclavicular lump on the right side when she went to her GYN.  A CT scan was done which showed no recurrent mass in the thyroid bed but a medial right breast lesion.  Her mother is also a breast cancer survivor.  They were both genetically tested and were negative for BRCA.  The patient's genetics was positive for other genes.  She is 5 feet 6 inches tall and 163 pounds.  Her preoperative bra size is a 36 B/C.   Review of Systems  Constitutional: Negative for activity change and appetite change.  HENT: Negative.   Eyes: Negative.   Respiratory: Negative.  Negative for chest tightness.   Cardiovascular: Negative.   Gastrointestinal: Negative.   Endocrine:  Negative.   Genitourinary: Negative.   Neurological: Negative.   Hematological: Negative.   Psychiatric/Behavioral: Negative.     Past Medical History:  Diagnosis Date  . ADD (attention deficit disorder)   . Breast cancer ([place]) 06/30/2020  . Breast lump    R breast  . Depression   . Family history of breast cancer   . Family history of lymphoma   . Family history of ovarian cancer   . Family history of prostate cancer   . 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 Hx of varicella   . Kidney stone 2012  . Medullary thyroid carcinoma ([place])   . Monoallelic mutation of CHEK2 gene in female patient   . Postpartum care following vaginal delivery (7/27) 10/02/2014, June 16 , 2020  . Thyroid cancer ([place]) 2021    Past Surgical History:  Procedure Laterality Date  . NO PAST SURGERIES    . THYROIDECTOMY N/A 04/12/2019   Procedure: TOTAL THYROIDECTOMY WITH LIMITED LYMPH NODE DISSECTION;  Surgeon: Armandina Gemma, MD;  Location: [facility];  Service: General;  Laterality: N/A;  . UNILATERAL SALPINGECTOMY Right 07/09/2017   Procedure: RIGHT SALPINGECTOMY WITH REMOVAL OF ECTOPIC PREGNANCY;  Surgeon: Azucena Fallen, MD;  Location: [facility];  Service: Gynecology;  Laterality: Right;      Current Outpatient Medications:  .  levonorgestrel (MIRENA) 20 MCG/24HR IUD, 1 each by Intrauterine route once., Disp: , Rfl:  .  levothyroxine (SYNTHROID) 175 MCG tablet, 1 tablet in the morning on an empty stomach, Disp: , Rfl:  .  VYVANSE 40 MG capsule, Take 40 mg by mouth every morning., Disp: , Rfl:  .  acetaminophen (TYLENOL) 325 MG tablet, Take 2 tablets (650 mg total) by mouth every 4 (four) hours as needed (for pain scale < 4). (Patient not taking: Reported on 07/15/2020), Disp: 100 tablet, Rfl: 0 .  dexamethasone (DECADRON) 4 MG tablet, Take 2 tablets (8 mg total) by mouth 2 (two) times daily. Start the day before Taxotere. Then take daily x 3 days after chemotherapy. (Patient not taking:  Reported on 07/15/2020), Disp: 30 tablet, Rfl: 1 .  ibuprofen (ADVIL) 600 MG tablet, Take 1 tablet (600 mg total) by mouth every 6 (six) hours. (Patient not taking: Reported on 07/15/2020), Disp: 30 tablet, Rfl: 0 .  lidocaine-prilocaine (EMLA) cream, Apply to affected area once (Patient not taking: Reported on 07/15/2020), Disp: 30 g, Rfl: 3 .  loratadine (CLARITIN) 10 MG tablet, Take 1 tablet (10 mg total) by mouth daily. (Patient not taking: Reported on 07/15/2020), Disp: 90 tablet, Rfl: 0 .  ondansetron (ZOFRAN) 8 MG tablet, Take 1 tablet (8 mg total) by mouth 2 (two) times daily as needed (Nausea or vomiting). Start on the third day after chemotherapy. (Patient not taking: Reported on 07/15/2020), Disp: 30 tablet, Rfl: 1 .  prochlorperazine (COMPAZINE) 10 MG tablet, Take 1 tablet (10 mg total) by mouth every 6 (six) hours as needed (Nausea or vomiting). (Patient not taking: Reported on 07/15/2020), Disp: 30 tablet, Rfl: 1 .  sertraline (ZOLOFT) 50 MG tablet, Take 50 mg by mouth at bedtime.  (Patient not taking: Reported on 07/15/2020), Disp: , Rfl:    Objective:   Vitals:   07/15/20 1505  BP: 119/83  Pulse: 91  SpO2: 98%    Physical Exam Vitals and nursing note reviewed.  Constitutional:      Appearance: Normal appearance.  HENT:     Head: Normocephalic and atraumatic.  Cardiovascular:     Rate and Rhythm: Normal rate.  Pulmonary:     Effort: Pulmonary effort is normal.  Abdominal:     General: Abdomen is flat. There is no distension.     Tenderness: There is no abdominal tenderness.  Musculoskeletal:        General: No deformity. Normal range of motion.  Skin:    General: Skin is warm.     Capillary Refill: Capillary refill takes less than 2 seconds.     Coloration: Skin is not jaundiced.     Findings: Bruising present.     Comments: Bruising from biopsy.  Neurological:     General: No focal deficit present.     Mental Status: She is alert and oriented to person, place, and  time.  Psychiatric:        Mood and Affect: Mood normal.        Behavior: Behavior normal.     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 Several reconstruction options were explained to the patient.  It is important to remember that breast reconstruction is an optional procedure. Reconstruction often requires several stages of surgery  and this means more than one operation.  The surgeries are often done several months apart.  The entire process from start to finish can take a year or more. The major goal of breast reconstruction is to look normal in clothing. There will always be scars and a difference noticeable without clothes.  This is true for asymmetries where both breasts will not be identical.  Surgery may be needed or desired to the non-cancerous breast in order to achieve better symmetry and satisfactory results.  Regardless of the reconstructive method, there is always risks and the possibility that the procedure will fail or have complications.  This could required additional surgeries.    We discussed the available methods of breast reconstruction and included:  1. Tissue expander with Acellular dermal matrix followed by implant based reconstruction. This can be done as one surgery or multiple surgeries.  2. Autologous reconstruction can include using a muscle or tissue from another area of the body for the reconstruction.  3. Combined procedures like the latissismus dorsi flaps that often uses the muscle with an expander or implant.  For each of the method discussed the risks, benefits, scars and recovery time were  discussed in detail. Specific risks included bleeding, infection, hematoma, seroma, scarring, pain, wound healing complications, flap loss, fat necrosis, capsular contracture, need for implant removal, donor site complications, bulge, hernia, umbilical necrosis, need for urgent reoperation, and need for dressing changes.   After the options were discussed we focused on the patient's desires and the procedure that was best for her based on all the information.  A total of 50 minutes of face-to-face time was spent in this encounter, of which >50% was spent in counseling.    Patient would like to have bilateral mastectomies with immediate reconstruction with expander and Flex HD.  Due to her family history she does not want to have a TRAM or a Diep flap.  She is scheduled to have chemotherapy until August 31.  She would like to have surgery the beginning or mid October after she returns from her wedding anniversary trip.  Pictures were obtained of the patient and placed in the chart with the patient's or guardian's permission.   [place], DO

## 2020-07-16 ENCOUNTER — Inpatient Hospital Stay: Payer: 59

## 2020-07-16 ENCOUNTER — Ambulatory Visit: Payer: Self-pay | Admitting: General Surgery

## 2020-07-16 NOTE — Progress Notes (Signed)
The following biosimilars have been selected for use:  Kanjinti and Ziextenzo per patient insurance approval.  Acquanetta Belling, RPH, BCPS, BCOP 07/16/2020 11:10 AM

## 2020-07-16 NOTE — Progress Notes (Addendum)
Pharmacist Chemotherapy Monitoring - Initial Assessment    Anticipated start date:  07/23/20   Regimen:  . Are orders appropriate based on the patient's diagnosis, regimen, and cycle? Yes . Does the plan date match the patient's scheduled date? Yes . Is the sequencing of drugs appropriate? Yes . Are the premedications appropriate for the patient's regimen? Yes . Prior Authorization for treatment is: Approved o If applicable, is the correct biosimilar selected based on the patient's insurance? Yes, Kanjinti and Ziextenzo    Organ Function and Labs: Marland Kitchen Are dose adjustments needed based on the patient's renal function, hepatic function, or hematologic function? No . Are appropriate labs ordered prior to the start of patient's treatment? Yes, urine pregnancy tests. . Other organ system assessment, if indicated: trastuzumab: Echo/ [facility] on 07/22/20 . The following baseline labs, if indicated, have been ordered:   Dose Assessment: . Are the drug doses appropriate? Yes . Are the following correct: o Drug concentrations Yes o IV fluid compatible with drug Yes o Administration routes Yes o Timing of therapy Yes . If applicable, does the patient have documented access for treatment and/or plans for port-a-cath placement? yes . If applicable, have lifetime cumulative doses been properly documented and assessed? not applicable Lifetime Dose Tracking  No doses have been documented on this patient for the following tracked chemicals: Doxorubicin, Epirubicin, Idarubicin, Daunorubicin, Mitoxantrone, Bleomycin, Oxaliplatin, Carboplatin, Liposomal Doxorubicin  o   Toxicity Monitoring/Prevention: . The patient has the following take home antiemetics prescribed: Ondansetron, Prochlorperazine and Dexamethasone . The patient has the following take home medications prescribed: N/A . Medication allergies and previous infusion related reactions, if applicable, have been reviewed and addressed. Yes . The  patient's current medication list has been assessed for drug-drug interactions with their chemotherapy regimen. ONE significant drug-drug interactions were identified on review.       Continue Mirena use? Triple positive breast cancer.  Order Review: . Are the treatment plan orders signed? No . Is the patient scheduled to see a provider prior to their treatment? No  I verify that I have reviewed each item in the above checklist and answered each question accordingly.  Raul Del [place], [place], 07/16/2020  12:03 PM

## 2020-07-17 ENCOUNTER — Ambulatory Visit (HOSPITAL_BASED_OUTPATIENT_CLINIC_OR_DEPARTMENT_OTHER): Admission: RE | Admit: 2020-07-17 | Payer: 59 | Source: Ambulatory Visit | Admitting: General Surgery

## 2020-07-17 SURGERY — INSERTION, TUNNELED CENTRAL VENOUS DEVICE, WITH PORT
Anesthesia: General | Site: Breast

## 2020-07-18 ENCOUNTER — Other Ambulatory Visit: Payer: Self-pay | Admitting: *Deleted

## 2020-07-18 ENCOUNTER — Other Ambulatory Visit: Payer: Self-pay

## 2020-07-18 ENCOUNTER — Inpatient Hospital Stay: Payer: 59

## 2020-07-18 ENCOUNTER — Other Ambulatory Visit: Payer: 59

## 2020-07-18 DIAGNOSIS — Z170 Estrogen receptor positive status [ER+]: Secondary | ICD-10-CM

## 2020-07-18 DIAGNOSIS — C50211 Malignant neoplasm of upper-inner quadrant of right female breast: Secondary | ICD-10-CM

## 2020-07-21 ENCOUNTER — Encounter: Payer: Self-pay | Admitting: Physical Therapy

## 2020-07-21 ENCOUNTER — Other Ambulatory Visit: Payer: Self-pay

## 2020-07-21 ENCOUNTER — Ambulatory Visit (HOSPITAL_COMMUNITY)
Admission: RE | Admit: 2020-07-21 | Discharge: 2020-07-21 | Disposition: A | Discharge status: Home / Self Care | Payer: 59 | Source: Ambulatory Visit | Attending: Oncology | Admitting: Oncology

## 2020-07-21 ENCOUNTER — Ambulatory Visit: Payer: 59 | Attending: General Surgery | Admitting: Physical Therapy

## 2020-07-21 ENCOUNTER — Other Ambulatory Visit: Payer: Self-pay | Admitting: Radiology

## 2020-07-21 ENCOUNTER — Telehealth: Payer: Self-pay | Admitting: *Deleted

## 2020-07-21 DIAGNOSIS — C50211 Malignant neoplasm of upper-inner quadrant of right female breast: Secondary | ICD-10-CM | POA: Insufficient documentation

## 2020-07-21 DIAGNOSIS — Z170 Estrogen receptor positive status [ER+]: Secondary | ICD-10-CM | POA: Diagnosis present

## 2020-07-21 DIAGNOSIS — C50311 Malignant neoplasm of lower-inner quadrant of right female breast: Secondary | ICD-10-CM | POA: Diagnosis present

## 2020-07-21 DIAGNOSIS — R293 Abnormal posture: Secondary | ICD-10-CM | POA: Diagnosis present

## 2020-07-21 LAB — GLUCOSE, CAPILLARY: Glucose-Capillary: 107 mg/dL — ABNORMAL HIGH (ref 70–99)

## 2020-07-21 IMAGING — CT NM PET TUM IMG INITIAL (PI) SKULL BASE T - THIGH
1 of 7 series · 2 of 25 positions shown · non-contrast
Comparison: Neck and chest CT on [DATE]

CLINICAL DATA: Initial treatment strategy for right breast
carcinoma.

EXAM:
NUCLEAR MEDICINE PET SKULL BASE TO THIGH
TECHNIQUE: 8.2 mCi F-18 FDG was injected intravenously. Full-ring PET imaging
was performed from the skull base to thigh after the radiotracer. CT
data was obtained and used for attenuation correction and anatomic
localization.
Fasting blood glucose: 103 mg/dl

[Series 4: ct sk_thigh 5.0 bf37 · axial · 5.0mm · 0.98mm/px · z∈[-718,-534]mm · 2 of 228 slices shown]
[im 182/228  brain]
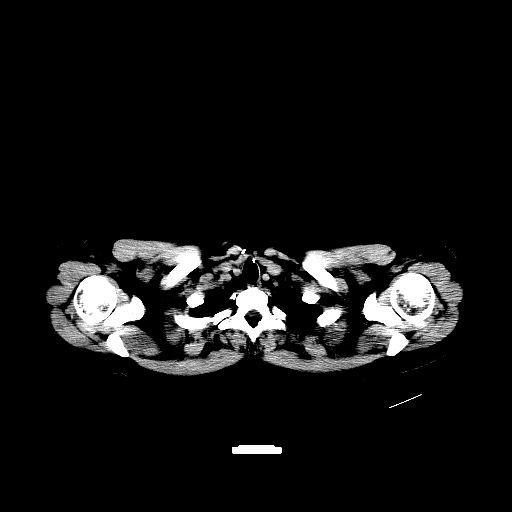
[im 228/228  brain]
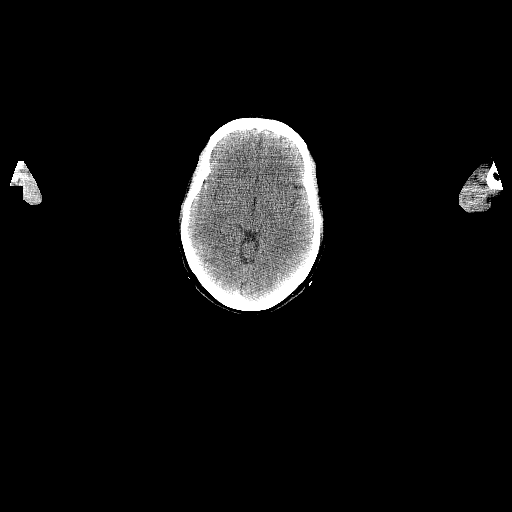

[2 of 25 positions shown; findings below may reference images not displayed]

FINDINGS: Mediastinal blood-pool activity (background): SUV max =

Liver activity (reference): SUV max = N/A

NECK: 2 sub-cm right supraclavicular lymph nodes show hypermetabolic
activity, with highest SUV max measuring 5.8.

Incidental CT findings:  None.

CHEST: 1.5 cm hypermetabolic soft tissue mass is seen in the medial
right breast, which has SUV max of 8.9 and is consistent with the
known primary breast carcinoma.

Mild right subpectoral lymphadenopathy is seen, with index lymph
node measuring 7 mm on image 57/4 with SUV max of 7.8. Mild left
axillary lymphadenopathy is seen with index lymph node measuring 8
mm on image 60/4, with SUV max of 7.3. Sub-cm hypermetabolic lymph
nodes are also seen in the right hilum with SUV max of 5.3, the
subcarinal region with SUV max of 5.8, and the lateral aortic region
with SUV max of 3.7.

No suspicious pulmonary nodules seen on CT images.

Incidental CT findings:  None.

ABDOMEN/PELVIS: No abnormal hypermetabolic activity within the
liver, pancreas, adrenal glands, or spleen. No hypermetabolic lymph
nodes in the abdomen or pelvis.

Incidental CT findings:  IUD noted in appropriate position.

SKELETON: Focal site of hypermetabolism is seen in the left
posterior T12 vertebral body, which has SUV max of 5.1. No
corresponding lesion is seen on CT images, however this is
consistent with a bone metastasis.

Incidental CT findings:  None.
IMPRESSION: 1.5 cm hypermetabolic mass in medial right breast, consistent with
known primary carcinoma.

Mild hypermetabolic lymphadenopathy in right supraclavicular,
subpectoral, axillary, and hilar regions, and mediastinum,
consistent with metastatic disease.

Focal hypermetabolism in the T12 vertebral body, consistent with
bone metastasis.

No evidence of metastatic disease within the abdomen or pelvis.

## 2020-07-21 MED ORDER — FLUDEOXYGLUCOSE F - 18 (FDG) INJECTION
8.3000 | Freq: Once | INTRAVENOUS | Status: AC | PRN
  Administered 2020-07-21: 8.19 via INTRAVENOUS

## 2020-07-21 NOTE — Telephone Encounter (Signed)
Called pt with appt for port placement with IR and new echo time. Confirmed arrival at [facility] [place] on 5/17 for port and 1:15pm for echo Informed will need driver and NPO after MN Received verbal understanding. No further needs or questions voiced.

## 2020-07-21 NOTE — Therapy (Signed)
[place], [place], [address] Phone: [phone]   Fax:  [phone]  Physical Therapy Evaluation  Patient Details  Name: Kayla Little MRN: [id number] Date of Birth: 1985/10/02 Referring Provider (PT): Dr. Autumn Messing   Encounter Date: 07/21/2020   PT End of Session - 07/21/20 1506    Visit Number 1    Number of Visits 2    Date for PT Re-Evaluation 01/21/21    PT Start Time 2426    PT Stop Time 1445    PT Time Calculation (min) 40 min    Activity Tolerance Patient tolerated treatment well    Behavior During Therapy [facility] for tasks assessed/performed           Past Medical History:  Diagnosis Date  . ADD (attention deficit disorder)   . Breast cancer ([place]) 06/30/2020  . Breast lump    R breast  . Depression   . Family history of breast cancer   . Family history of lymphoma   . Family history of ovarian cancer   . Family history of prostate cancer   . 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 Hx of varicella   . Kidney stone 2012  . Medullary thyroid carcinoma ([place])   . Monoallelic mutation of CHEK2 gene in female patient   . Postpartum care following vaginal delivery (7/27) 10/02/2014, June 16 , 2020  . Thyroid cancer ([place]) 2021    Past Surgical History:  Procedure Laterality Date  . NO PAST SURGERIES    . THYROIDECTOMY N/A 04/12/2019   Procedure: TOTAL THYROIDECTOMY WITH LIMITED LYMPH NODE DISSECTION;  Surgeon: Armandina Gemma, MD;  Location: [facility];  Service: General;  Laterality: N/A;  . UNILATERAL SALPINGECTOMY Right 07/09/2017   Procedure: RIGHT SALPINGECTOMY WITH REMOVAL OF ECTOPIC PREGNANCY;  Surgeon: Azucena Fallen, MD;  Location: [facility];  Service: Gynecology;  Laterality: Right;    There were no vitals filed for this visit.    Subjective Assessment - 07/21/20 1448    Subjective Patient reports she is here today for a baseline assessment for her right breast  cancer.    Pertinent History Patient was diagnosed on 06/27/2020 with right grade I invasive ductal carcinoma breast cancer. There are 2 masses in the lower inner quadrant measuring 2.6 cm and 9 mm with calcifications totaling 5.4 cm. It is triple positive and she has 8 abnormal axillary nodes with 1 biopsied and positive. She also has a supraclavicular node that is palpable and painful. She has a hx of medullary thyroid cancer with a thyroidectomy 04/12/2019. She smokes 1/3 pack cigarettes/day and drinks red wine daily with varying amounts 0-3 glasses.    Patient Stated Goals Reduce lymphedema risk and learn post op shoulder ROM HEP    Currently in Pain? Yes    Pain Score 2     Pain Location --   Right clavicle   Pain Orientation Right    Pain Descriptors / Indicators Aching;Sore    Pain Type Acute pain    Pain Onset 1 to 4 weeks ago    Pain Frequency Constant    Aggravating Factors  Working on computer    Pain Relieving Factors unknown    Multiple Pain Sites No              [facility] PT Assessment - 07/21/20 0001      Assessment   Medical Diagnosis Right breast cancer    Referring Provider (  PT) Dr. Autumn Messing    Onset Date/Surgical Date 06/27/20    Hand Dominance Right    Prior Therapy none      Precautions   Precautions Other (comment)    Precaution Comments active cancer      Restrictions   Weight Bearing Restrictions No      Balance Screen   Has the patient fallen in the past 6 months No    Has the patient had a decrease in activity level because of a fear of falling?  No    Is the patient reluctant to leave their home because of a fear of falling?  No      Home Social worker Private residence    Living Arrangements Spouse/significant other;Children   Husband, 2, 74 y.o. kids and 76 y.o. stepdaughter   Available Help at Discharge Family      Prior Function   Level of Independence Independent    Vocation Full time employment    Vocation Requirements  accounting    Leisure Patient plays tennis once a week and walks 5x/week for 30-45 min      Cognition   Overall Cognitive Status Within Functional Limits for tasks assessed      Posture/Postural Control   Posture/Postural Control Postural limitations    Postural Limitations Rounded Shoulders;Forward head      ROM / Strength   AROM / PROM / Strength AROM;Strength      AROM   Overall AROM Comments Cervical AROM is WNL    AROM Assessment Site Shoulder    Right/Left Shoulder Right;Left    Right Shoulder Extension 45 Degrees    Right Shoulder Flexion 149 Degrees    Right Shoulder ABduction 164 Degrees    Right Shoulder Internal Rotation 65 Degrees    Right Shoulder External Rotation 84 Degrees    Left Shoulder Extension 50 Degrees    Left Shoulder Flexion 158 Degrees    Left Shoulder ABduction 161 Degrees    Left Shoulder Internal Rotation 69 Degrees    Left Shoulder External Rotation 87 Degrees      Strength   Overall Strength Within functional limits for tasks performed             LYMPHEDEMA/ONCOLOGY QUESTIONNAIRE - 07/21/20 0001      Type   Cancer Type Right breast      Lymphedema Assessments   Lymphedema Assessments Upper extremities      Right Upper Extremity Lymphedema   10 cm Proximal to Olecranon Process 28 cm    Olecranon Process 24.2 cm    10 cm Proximal to Ulnar Styloid Process 22.7 cm    Just Proximal to Ulnar Styloid Process 16.2 cm    Across Hand at [employer] 19.5 cm    At [place] of 2nd Digit 6.4 cm      Left Upper Extremity Lymphedema   10 cm Proximal to Olecranon Process 28.7 cm    Olecranon Process 23.8 cm    10 cm Proximal to Ulnar Styloid Process 22.3 cm    Just Proximal to Ulnar Styloid Process 16.6 cm    Across Hand at [employer] 19.4 cm    At [place] of 2nd Digit 6.6 cm           L-DEX FLOWSHEETS - 07/21/20 1500      L-DEX LYMPHEDEMA SCREENING   Measurement Type Unilateral    L-DEX MEASUREMENT EXTREMITY Upper Extremity     POSITION  Standing  DOMINANT SIDE Right    At Risk Side Right    BASELINE SCORE (UNILATERAL) -0.7            The patient was assessed using the L-Dex machine today to produce a lymphedema index baseline score. The patient will be reassessed on a regular basis (typically every 3 months) to obtain new L-Dex scores. If the score is > 6.5 points away from his/her baseline score indicating onset of subclinical lymphedema, it will be recommended to wear a compression garment for 4 weeks, 12 hours per day and then be reassessed. If the score continues to be > 6.5 points from baseline at reassessment, we will initiate lymphedema treatment. Assessing in this manner has a 95% rate of preventing clinically significant lymphedema.     Katina Dung - 07/21/20 0001    Open a tight or new jar No difficulty    Do heavy household chores (wash walls, wash floors) No difficulty    Carry a shopping bag or briefcase No difficulty    Wash your back No difficulty    Use a knife to cut food No difficulty    Recreational activities in which you take some force or impact through your arm, shoulder, or hand (golf, hammering, tennis) No difficulty    During the past week, to what extent has your arm, shoulder or hand problem interfered with your normal social activities with family, friends, neighbors, or groups? Not at all    During the past week, to what extent has your arm, shoulder or hand problem limited your work or other regular daily activities Not at all    Arm, shoulder, or hand pain. None    Tingling (pins and needles) in your arm, shoulder, or hand None    Difficulty Sleeping Moderate difficulty    DASH Score 4.55 %            Objective measurements completed on examination: See above findings.       Patient was instructed today in a home exercise program today for post op shoulder range of motion. These included active assist shoulder flexion in sitting, scapular retraction, wall walking with  shoulder abduction, and hands behind head external rotation.  She was encouraged to do these twice a day, holding 3 seconds and repeating 5 times when permitted by her physician.            PT Education - 07/21/20 1506    Education Details Lymphedema risk reduction and post op HEP    Person(s) Educated Patient    Methods Explanation;Demonstration;Handout    Comprehension Returned demonstration;Verbalized understanding               PT Long Term Goals - 07/21/20 1509      PT LONG TERM GOAL #1   Title Patient will demonstrate she has regained full shoulder ROM and function post operatively compared to baselines.    Time 6    Period Months    Status New    Target Date 01/21/21                  Plan - 07/21/20 1507    Clinical Impression Statement Patient was diagnosed on 06/27/2020 with right grade I invasive ductal carcinoma breast cancer. There are 2 masses in the lower inner quadrant measuring 2.6 cm and 9 mm with calcifications totaling 5.4 cm. It is triple positive and she has 8 abnormal axillary nodes with 1 biopsied and positive. She also has a supraclavicular node  that is palpable and painful. She has a hx of medullary thyroid cancer with a thyroidectomy 04/12/2019. She smokes 1/3 pack cigarettes/day and drinks red wine daily with varying amounts 0-3 glasses. She is planning to have neoadjuvant chemotherapy followed by a bilateral mastectomy with possible reconstruction and then radiation and anti-estrogen therapy. She will benefit from post op PT to reassess and determine nedes and from L-Dex screens every 3 months for 2 years to detect subclinical lymphedema.    Stability/Clinical Decision Making Stable/Uncomplicated    Clinical Decision Making Low    Rehab Potential Excellent    PT Frequency --   Eval and 1 f/u visit   PT Treatment/Interventions ADLs/Self Care Home Management;Therapeutic exercise;Patient/family education    PT Next Visit Plan Will reassess 3-4  weeks post op    PT Home Exercise Plan Post op HEP    Consulted and Agree with Plan of Care Patient           Patient will benefit from skilled therapeutic intervention in order to improve the following deficits and impairments:  Postural dysfunction,Decreased range of motion,Decreased knowledge of precautions,Impaired UE functional use,Pain  Visit Diagnosis: Malignant neoplasm of lower-inner quadrant of right breast of female, estrogen receptor positive ([place]) - Plan: PT plan of care cert/re-cert  Abnormal posture - Plan: PT plan of care cert/re-cert   Patient will follow up at outpatient cancer rehab 3-4 weeks following surgery.  If the patient requires physical therapy at that time, a specific plan will be dictated and sent to the referring physician for approval. The patient was educated today on appropriate basic range of motion exercises to begin post operatively and the importance of attending the After Breast Cancer class following surgery.  Patient was educated today on lymphedema risk reduction practices as it pertains to recommendations that will benefit the patient immediately following surgery.  She verbalized good understanding.     Problem List Patient Active Problem List   Diagnosis Date Noted  . MEN 2A (multiple endocrine neoplasia, type 2A) ([place]) 04/12/2019  . Multiple thyroid nodules 04/08/2019  . Family history of breast cancer   . Family history of prostate cancer   . Family history of lymphoma   . Family history of ovarian cancer   . Monoallelic mutation of CHEK2 gene in female patient   . 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 SVD (spontaneous vaginal delivery) 08/23/2018  . Postpartum care following vaginal delivery (6/16) 08/23/2018  . Second degree perineal laceration 08/23/2018  . Malignant neoplasm of upper-inner quadrant of right breast in female, estrogen receptor positive ([place]) 08/22/2018  . Type O blood, Rh negative  10/08/2014   Annia Friendly, PT 07/21/20 3:11 PM  [place], [place], [address] Phone: [phone]   Fax:  [phone]  Name: Kayla Little MRN: [id number] Date of Birth: May 02, 1985

## 2020-07-21 NOTE — Patient Instructions (Signed)

## 2020-07-22 ENCOUNTER — Ambulatory Visit (HOSPITAL_BASED_OUTPATIENT_CLINIC_OR_DEPARTMENT_OTHER)
Admission: RE | Admit: 2020-07-22 | Discharge: 2020-07-22 | Disposition: A | Discharge status: Home / Self Care | Payer: 59 | Source: Ambulatory Visit | Attending: Oncology | Admitting: Oncology

## 2020-07-22 ENCOUNTER — Ambulatory Visit (HOSPITAL_COMMUNITY): Payer: 59

## 2020-07-22 ENCOUNTER — Ambulatory Visit (HOSPITAL_COMMUNITY)
Admission: RE | Admit: 2020-07-22 | Discharge: 2020-07-22 | Disposition: A | Discharge status: Home / Self Care | Payer: 59 | Source: Ambulatory Visit | Attending: Oncology | Admitting: Oncology

## 2020-07-22 ENCOUNTER — Other Ambulatory Visit: Payer: Self-pay | Admitting: Oncology

## 2020-07-22 ENCOUNTER — Other Ambulatory Visit: Payer: Self-pay

## 2020-07-22 ENCOUNTER — Other Ambulatory Visit: Payer: Self-pay | Admitting: *Deleted

## 2020-07-22 ENCOUNTER — Other Ambulatory Visit: Payer: Self-pay | Admitting: Pharmacist

## 2020-07-22 ENCOUNTER — Encounter (HOSPITAL_COMMUNITY): Payer: Self-pay

## 2020-07-22 DIAGNOSIS — C50211 Malignant neoplasm of upper-inner quadrant of right female breast: Secondary | ICD-10-CM

## 2020-07-22 DIAGNOSIS — Z79899 Other long term (current) drug therapy: Secondary | ICD-10-CM | POA: Insufficient documentation

## 2020-07-22 DIAGNOSIS — C50911 Malignant neoplasm of unspecified site of right female breast: Secondary | ICD-10-CM | POA: Insufficient documentation

## 2020-07-22 DIAGNOSIS — Z0189 Encounter for other specified special examinations: Secondary | ICD-10-CM | POA: Diagnosis not present

## 2020-07-22 DIAGNOSIS — F17210 Nicotine dependence, cigarettes, uncomplicated: Secondary | ICD-10-CM | POA: Diagnosis not present

## 2020-07-22 DIAGNOSIS — Z170 Estrogen receptor positive status [ER+]: Secondary | ICD-10-CM

## 2020-07-22 DIAGNOSIS — Z79890 Hormone replacement therapy (postmenopausal): Secondary | ICD-10-CM | POA: Diagnosis not present

## 2020-07-22 HISTORY — PX: IR IMAGING GUIDED PORT INSERTION: IMG5740

## 2020-07-22 IMAGING — XA IR IMAGING GUIDED PORT INSERTION
2 series · 2 of 2 positions shown · non-contrast
Comparison: PET-CT-[DATE]

INDICATION: History of metastatic right-sided breast cancer. In need of durable
intravenous access for chemotherapy administration.

EXAM:
IMPLANTED PORT A CATH PLACEMENT WITH ULTRASOUND AND FLUOROSCOPIC
GUIDANCE

[Series 1: single · 1 of 1 slices shown]
[im 1/1]
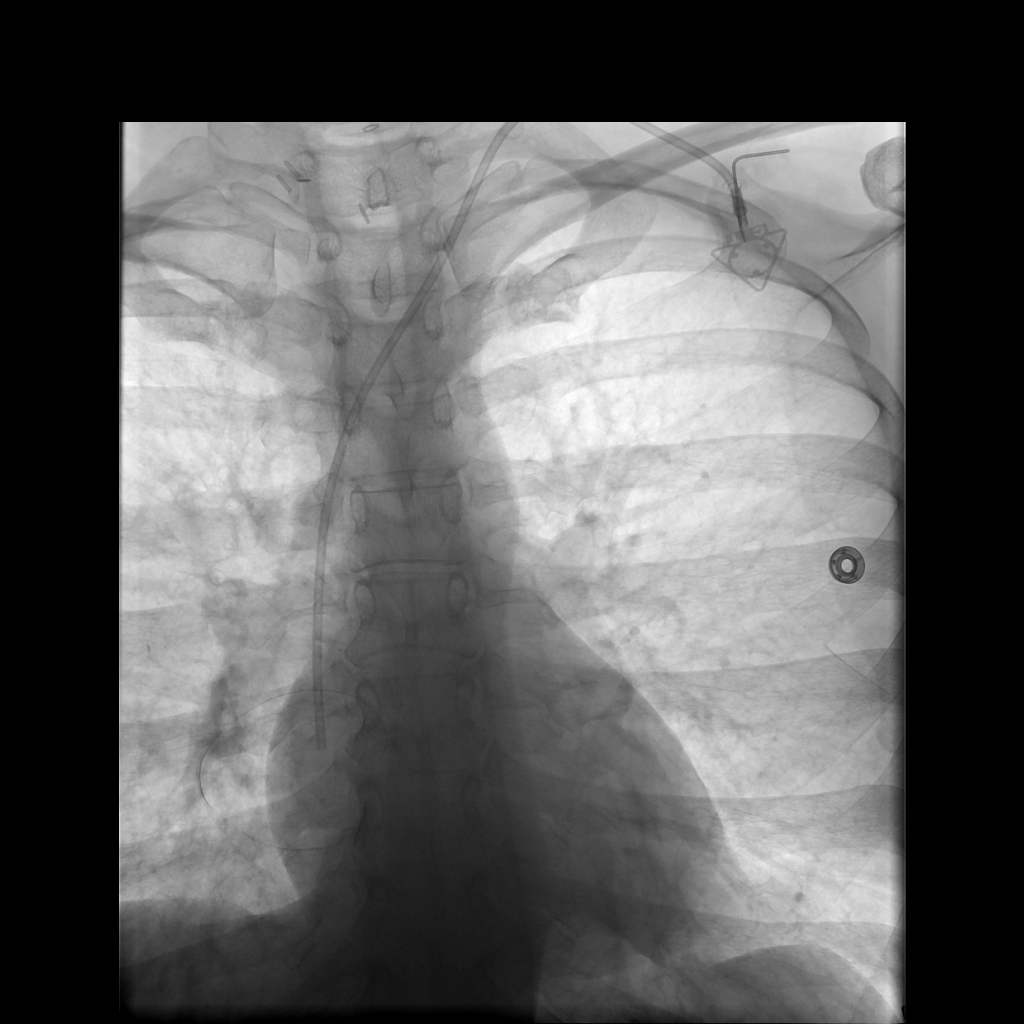

[Series 1: ir imaging guided port insertion · 1 of 1 slices shown]
[im 1/1]
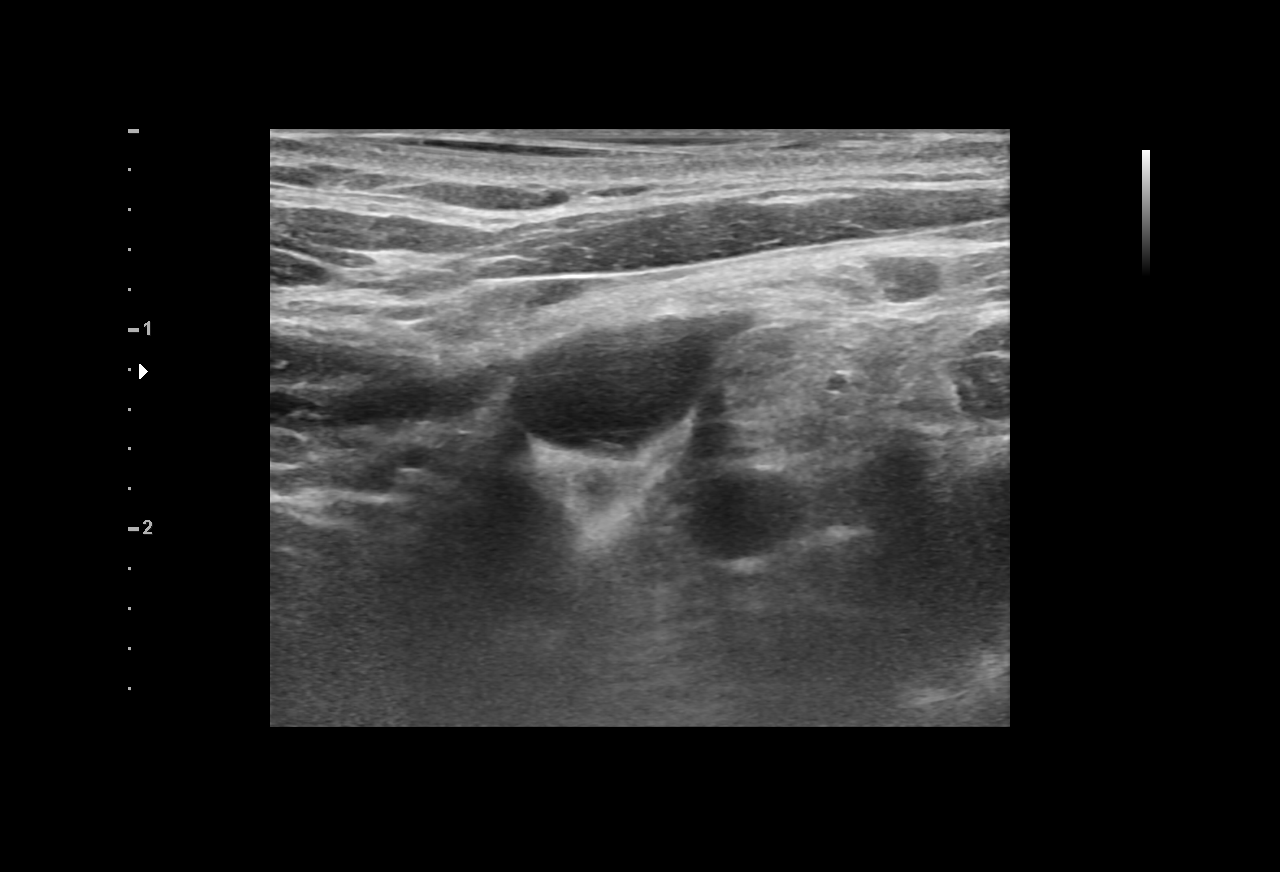

[2 of 2 positions shown; findings below may reference images not displayed]

MEDICATIONS:
None

ANESTHESIA/SEDATION:
Moderate (conscious) sedation was employed during this procedure. A
total of Versed 2.5 mg and Fentanyl 125 mcg was administered
intravenously.

Moderate Sedation Time: 32 minutes. The patient's level of
consciousness and vital signs were monitored continuously by
radiology nursing throughout the procedure under my direct
supervision.

CONTRAST:  None

FLUOROSCOPY TIME:  1 minute, 6 seconds (4 mGy)

COMPLICATIONS:
None immediate.

PROCEDURE:
The procedure, risks, benefits, and alternatives were explained to
the patient. Questions regarding the procedure were encouraged and
answered. The patient understands and consents to the procedure.

Given history of right-sided breast cancer decision was made to
place a left internal jugular approach port a catheter.

The left neck and chest were prepped with chlorhexidine in a sterile
fashion, and a sterile drape was applied covering the operative
field. Maximum barrier sterile technique with sterile gowns and
gloves were used for the procedure. A timeout was performed prior to
the initiation of the procedure. Local anesthesia was provided with
1% lidocaine with epinephrine.

After creating a small venotomy incision, a micropuncture kit was
utilized to access the internal jugular vein. Real-time ultrasound
guidance was utilized for vascular access including the acquisition
of a permanent ultrasound image documenting patency of the accessed
vessel. The microwire was utilized to measure appropriate catheter
length.

A subcutaneous port pocket was then created along the upper chest
wall utilizing a combination of sharp and blunt dissection. The
pocket was irrigated with sterile saline. A single lumen "ISP" sized
power injectable port was chosen for placement. The 8 Fr catheter
was tunneled from the port pocket site to the venotomy incision. The
port was placed in the pocket. The external catheter was trimmed to
appropriate length. At the venotomy, an 8 Fr peel-away sheath was
placed over a guidewire under fluoroscopic guidance. The catheter
was then placed through the sheath and the sheath was removed. Final
catheter positioning was confirmed and documented with a
fluoroscopic spot radiograph. The port was accessed with HALLI
needle, aspirated and flushed with heparinized saline.

The venotomy site was closed with an interrupted 4-0 Vicryl suture.
The port pocket incision was closed with interrupted 2-0 Vicryl
suture. The skin was opposed with a running subcuticular 4-0 Vicryl
suture. Dermabond and HALLI were applied to both incisions.
Dressings were applied. The patient tolerated the procedure well
without immediate post procedural complication.
FINDINGS: After catheter placement, the tip lies within the superior
cavoatrial junction. The catheter aspirates and flushes normally and
is ready for immediate use.
IMPRESSION: Successful placement of a left internal jugular approach power
injectable Port-A-Cath. The catheter is ready for immediate use.

## 2020-07-22 MED ORDER — LIDOCAINE-EPINEPHRINE 1 %-1:100000 IJ SOLN
INTRAMUSCULAR | Status: AC | PRN
  Administered 2020-07-22: 10 mL

## 2020-07-22 MED ORDER — MIDAZOLAM HCL 2 MG/2ML IJ SOLN
INTRAMUSCULAR | Status: AC
  Filled 2020-07-22: qty 2

## 2020-07-22 MED ORDER — FENTANYL CITRATE (PF) 100 MCG/2ML IJ SOLN
INTRAMUSCULAR | Status: AC
  Filled 2020-07-22: qty 2

## 2020-07-22 MED ORDER — SODIUM CHLORIDE 0.9 % IV SOLN: INTRAVENOUS | Status: DC

## 2020-07-22 MED ORDER — MIDAZOLAM HCL 2 MG/2ML IJ SOLN
INTRAMUSCULAR | Status: AC | PRN
  Administered 2020-07-22: 1 mg via INTRAVENOUS
  Administered 2020-07-22: 0.5 mg via INTRAVENOUS
  Administered 2020-07-22: 1 mg via INTRAVENOUS

## 2020-07-22 MED ORDER — LIDOCAINE-EPINEPHRINE 1 %-1:100000 IJ SOLN
INTRAMUSCULAR | Status: AC
  Filled 2020-07-22: qty 1

## 2020-07-22 MED ORDER — HEPARIN SOD (PORK) LOCK FLUSH 100 UNIT/ML IV SOLN
INTRAVENOUS | Status: AC
  Administered 2020-07-22: 5 [IU]
  Filled 2020-07-22: qty 5

## 2020-07-22 MED ORDER — FENTANYL CITRATE (PF) 100 MCG/2ML IJ SOLN
INTRAMUSCULAR | Status: AC | PRN
  Administered 2020-07-22: 25 ug via INTRAVENOUS
  Administered 2020-07-22 (×2): 50 ug via INTRAVENOUS

## 2020-07-22 NOTE — H&P (Signed)
Chief Complaint: Patient was seen in consultation today for [facility] a cath placement at the request of Magrinat,Gustav C  Referring Physician(s): Chauncey Cruel  Supervising Physician: Sandi Mariscal  Patient Status: [facility] - Out-pt  History of Present Illness: Kayla Little is a 35 y.o. female    Hx medullary thyroid cancer-- post thyroidectomy 04/12/19 Presented with SCLN; CT revealing LAN;  Rt breast mass; left liver lesion; hilar nodes T7 lesion New Rt Breast Cancer: Right Breast Mammo 06/27/20 1. 2.6 cm irregular mass in inner breast, with associated microcalcifications spanning up to 5.1 cm, corresponding to chest CT finding. 2. Adjacent 0.9 cm mass in inner breast, 2 cm away from dominant mass. 3. Two indeterminate masses in outer breast. 4. At least 3 enlarged lymph nodes in right axilla ++ Breast cancer on Bx 06/30/20 ++ Rt axillary LN  To start chemo tomorrow Surgery planned after chemo  Scheduled today for Port a cath placement  Past Medical History:  Diagnosis Date  . ADD (attention deficit disorder)   . Breast cancer ([place]) 06/30/2020  . Breast lump    R breast  . Depression   . Family history of breast cancer   . Family history of lymphoma   . Family history of ovarian cancer   . Family history of prostate cancer   . 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 Hx of varicella   . Kidney stone 2012  . Medullary thyroid carcinoma ([place])   . Monoallelic mutation of CHEK2 gene in female patient   . Postpartum care following vaginal delivery (7/27) 10/02/2014, June 16 , 2020  . Thyroid cancer ([place]) 2021    Past Surgical History:  Procedure Laterality Date  . NO PAST SURGERIES    . THYROIDECTOMY N/A 04/12/2019   Procedure: TOTAL THYROIDECTOMY WITH LIMITED LYMPH NODE DISSECTION;  Surgeon: Armandina Gemma, MD;  Location: [facility]S;  Service: General;  Laterality: N/A;  . UNILATERAL SALPINGECTOMY Right 07/09/2017   Procedure: RIGHT SALPINGECTOMY WITH  REMOVAL OF ECTOPIC PREGNANCY;  Surgeon: Azucena Fallen, MD;  Location: [facility];  Service: Gynecology;  Laterality: Right;    Allergies: Patient has no known allergies.  Medications: Prior to Admission medications   Medication Sig Start Date End Date Taking? Authorizing Provider  acetaminophen (TYLENOL) 325 MG tablet Take 2 tablets (650 mg total) by mouth every 4 (four) hours as needed (for pain scale < 4). 08/23/18  Yes Sigmon, Tyler Deis, CNM  levonorgestrel (MIRENA) 20 MCG/24HR IUD 1 each by Intrauterine route once.   Yes [provider]  levothyroxine (SYNTHROID) 175 MCG tablet 1 tablet in the morning on an empty stomach   Yes [provider]  sertraline (ZOLOFT) 50 MG tablet Take 50 mg by mouth at bedtime. 01/26/17  Yes [provider]  VYVANSE 40 MG capsule Take 40 mg by mouth every morning. 06/29/20  Yes [provider]  dexamethasone (DECADRON) 4 MG tablet Take 2 tablets (8 mg total) by mouth 2 (two) times daily. Start the day before Taxotere. Then take daily x 3 days after chemotherapy. Patient not taking: No sig reported 07/08/20   Magrinat, Virgie Dad, MD  ibuprofen (ADVIL) 600 MG tablet Take 1 tablet (600 mg total) by mouth every 6 (six) hours. Patient not taking: No sig reported 08/23/18   Darliss Cheney, CNM  lidocaine-prilocaine (EMLA) cream Apply to affected area once Patient not taking: No sig reported 07/08/20   Magrinat, Virgie Dad, MD  loratadine [facility])  10 MG tablet Take 1 tablet (10 mg total) by mouth daily. Patient not taking: No sig reported 07/08/20   Magrinat, Virgie Dad, MD  ondansetron (ZOFRAN) 8 MG tablet Take 1 tablet (8 mg total) by mouth 2 (two) times daily as needed (Nausea or vomiting). Start on the third day after chemotherapy. Patient not taking: No sig reported 07/08/20   Magrinat, Virgie Dad, MD  prochlorperazine (COMPAZINE) 10 MG tablet Take 1 tablet (10 mg total) by mouth every 6 (six) hours as needed (Nausea or vomiting). Patient  not taking: No sig reported 07/08/20   Magrinat, Virgie Dad, MD     Family History  Problem Relation Age of Onset  . Breast cancer Mother 23       2 types of cancer one side mastectomy with chemo  . Endometriosis Mother   . Endometriosis Maternal Grandmother   . Breast cancer Maternal Grandmother        post menopausal  . Diabetes Maternal Grandmother   . Cancer Maternal Grandfather        prostate and lymphoma  . Alzheimer's disease Paternal Grandmother   . Cancer Father 31       prostate cancer  . Aneurysm Paternal Grandfather        d. 39s    Social History   Socioeconomic History  . Marital status: Married    Spouse name: Not on file  . Number of children: Not on file  . Years of education: Not on file  . Highest education level: Not on file  Occupational History  . Occupation: homemaker  Tobacco Use  . Smoking status: Current Some Day Smoker    Packs/day: 0.50    Years: 5.00    Pack years: 2.50    Types: Cigarettes  . Smokeless tobacco: Never Used  . Tobacco comment: Actively trying to quit.  Vaping Use  . Vaping Use: Never used  Substance and Sexual Activity  . Alcohol use: Yes    Alcohol/week: 7.0 - 14.0 standard drinks    Types: 7 - 14 Glasses of wine per week    Comment: glass of wine each night   . Drug use: Not Currently    Types: Marijuana    Comment: not during pregnancy, last use was 03/27/2019  . Sexual activity: Yes    Birth control/protection: None, I.U.D.  Other Topics Concern  . Not on file  Social History Narrative  . Not on file   Social Determinants of Health   Financial Resource Strain: Low Risk   . Difficulty of Paying Living Expenses: Not hard at all  Food Insecurity: No Food Insecurity  . Worried About Charity fundraiser in the Last Year: Never true  . Ran Out of Food in the Last Year: Never true  Transportation Needs: No Transportation Needs  . Lack of Transportation (Medical): No  . Lack of Transportation (Non-Medical): No   Physical Activity: Not on file  Stress: Not on file  Social Connections: Not on file     Review of Systems: A 12 point ROS discussed and pertinent positives are indicated in the HPI above.  All other systems are negative.  Review of Systems  Constitutional: Negative for activity change, fatigue, fever and unexpected weight change.  Respiratory: Negative for cough and shortness of breath.   Cardiovascular: Negative for chest pain.  Gastrointestinal: Negative for abdominal pain.  Musculoskeletal: Negative for back pain.  Psychiatric/Behavioral: Negative for behavioral problems and confusion.    Vital Signs: BP  104/74   Pulse 87   Temp 98.3 F (36.8 C) (Oral)   Resp 18   Ht _0  (1.676 m)   Wt 160 lb (72.6 kg)   SpO2 99%   BMI 25.82 kg/m   Physical Exam Vitals reviewed.  HENT:     Mouth/Throat:     Mouth: Mucous membranes are moist.  Cardiovascular:     Rate and Rhythm: Normal rate and regular rhythm.     Heart sounds: Normal heart sounds.  Pulmonary:     Effort: Pulmonary effort is normal.     Breath sounds: Normal breath sounds.  Abdominal:     Palpations: Abdomen is soft.     Tenderness: There is no abdominal tenderness.  Musculoskeletal:        General: Normal range of motion.  Skin:    General: Skin is warm.  Neurological:     Mental Status: She is alert and oriented to person, place, and time.  Psychiatric:        Behavior: Behavior normal.     Imaging: CT SOFT TISSUE NECK W CONTRAST  Result Date: 06/24/2020 CLINICAL DATA:  Right supraclavicular lump. History of thyroid cancer with thyroidectomy. EXAM: CT NECK WITH CONTRAST TECHNIQUE: Multidetector CT imaging of the neck was performed using the standard protocol following the bolus administration of intravenous contrast. CONTRAST:  63m ISOVUE-300 IOPAMIDOL (ISOVUE-300) INJECTION 61% COMPARISON:  Thyroid ultrasound 11/19/2019 FINDINGS: Pharynx and larynx: Normal. No mass or swelling. Salivary glands: No  inflammation, mass, or stone. Thyroid: Postop total thyroidectomy with surgical clips in the soft tissues. No recurrent mass in the thyroid bed Lymph nodes: Right level 2 lymph nodes measure 8.3 mm and 9.9 mm. Right posterior lymph node in the mid neck measures 5.5 mm on axial image 55. Right supraclavicular palpable abnormality corresponds to 2 adjacent lymph nodes. The largest is 6.5 mm. Left level 2 lymph nodes adjacent to each other measure 10.2 mm and 9.9 mm in diameter. Multiple small 4-6 mm posterior lymph nodes on the left. Left lower posterior lymph node measures 5.5 mm. Vascular: Normal vascular enhancement. Limited intracranial: Negative Visualized orbits: Not imaged Mastoids and visualized paranasal sinuses: Mucosal edema in the left maxillary sinus with air-fluid level. Remaining sinuses clear. Mastoid clear. Skeleton: No acute abnormality. Upper chest: Lung apices clear bilaterally. Other: None IMPRESSION: Postop thyroidectomy for thyroid cancer. No recurrent mass in the thyroid bed Palpable abnormality corresponds to a cluster of 2 lymph nodes in the right supraclavicular region. The largest of these measures 6.5 mm. This has heterogeneous enhancement. Prominent lymph nodes in the neck bilaterally. None of these are definitely pathologically enlarged however given the history of thyroid cancer, close follow-up recommended. Consider fine-needle aspiration biopsy for further evaluation. Correlate with thyroid function testing. Electronically Signed   By: CFranchot GalloM.D.   On: 06/24/2020 15:34   CT CHEST W CONTRAST  Result Date: 06/24/2020 CLINICAL DATA:  RIGHT supraclavicular lump by report in a patient with history of prior thyroid neoplasm. EXAM: CT CHEST WITH CONTRAST TECHNIQUE: Multidetector CT imaging of the chest was performed during intravenous contrast administration. CONTRAST:  746mISOVUE-300 IOPAMIDOL (ISOVUE-300) INJECTION 61% COMPARISON:  None aside from prior abdominal imaging from  2012 and a CT of the neck acquired on today's date. FINDINGS: Cardiovascular: Normal caliber thoracic aorta. Normal heart size. No pericardial effusion. Normal caliber central pulmonary vessels. Mediastinum/Nodes: Post thyroidectomy, area of concern not seen on the chest CT based on the gel cap over the RIGHT supraclavicular  region which is imaged on the CT of the neck, please see this CT for further detail. 11 mm RIGHT axillary lymph node and a cluster of small RIGHT sided lymph nodes in the RIGHT axilla. Enhancing 1.4 cm lesion in the medial RIGHT breast. (Image 25, series 3) no internal mammary lymphadenopathy. No mediastinal lymphadenopathy. Mild fullness of RIGHT hilar nodal tissue, the largest measuring approximately 1 cm on image 31 of series 3 Lungs/Pleura: Minimal parenchymal scarring in the chest. No effusion. Airways are patent. No suspicious pulmonary nodules. Upper Abdomen: 1.2 cm lesion in the LEFT hepatic lobe with central hyperattenuation that is close to blood pool. Hepatic steatosis. Imaged portions of upper abdominal contents otherwise unremarkable. Musculoskeletal: No acute musculoskeletal process. No destructive bone finding. Sclerotic lesion in the midthoracic spine with spiculated margins at the T7 level favored to represent a bone island IMPRESSION: 1. Enhancing 1.4 cm lesion in the medial RIGHT breast with associated mildly enlarged RIGHT axillary lymph nodes. Findings are concerning for breast cancer with nodal metastatic disease. Further evaluation with mammography and ultrasound is suggested for further evaluation. 2. 1.2 cm lesion in the LEFT hepatic lobe with central hyperattenuation that is close to blood pool. This may represent a hemangioma. However, given patient history of thyroid cancer, hypervascular metastatic lesion could potentially have a similar appearance. Hepatic MRI may be helpful for further evaluation. 3. Also with enlargement of RIGHT infrahilar and RIGHT hilar nodal  tissue that while borderline is suspicious in light of other findings. 4. Sclerotic lesion in the midthoracic spine with spiculated margins at the T7 level favored to represent a bone island. Attention on follow-up is suggested. 5. Post thyroidectomy, area of concern not seen on the chest CT based on the gel cap over the RIGHT supraclavicular region which is imaged on the CT of the neck, please see this CT of the neck, please see this CT of the neck for further detail. 6. Marked hepatic steatosis These results will be called to the ordering clinician or representative by the Radiologist Assistant, and communication documented in the PACS or [employer]. Electronically Signed   By: Zetta Bills M.D.   On: 06/24/2020 16:36   MR BREAST BILATERAL W WO CONTRAST INC CAD  Result Date: 07/07/2020 CLINICAL DATA:  35 year old female with diagnosis of right breast cancer which was originally found on a chest CT performed 06/24/2020. She has family history of breast cancer in her mother and maternal grandmother. She had 2 ultrasound-guided biopsies of masses in the medial right breast, both demonstrating invasive ductal carcinoma. She also had a right axillary mass biopsied which showed invasive ductal carcinoma with no residual nodal tissue, which could represent an additional primary focus or an entirely replaced metastatic lymph node. A biopsy of the left breast was benign. She presents for staging MRI prior to initiating neoadjuvant chemotherapy. LABS:  None. EXAM: BILATERAL BREAST MRI WITH AND WITHOUT CONTRAST TECHNIQUE: Multiplanar, multisequence MR images of both breasts were obtained prior to and following the intravenous administration of 20 mL of MultiHance Three-dimensional MR images were rendered by post-processing of the original MR data on an independent workstation. The three-dimensional MR images were interpreted, and findings are reported in the following complete MRI report for this study. Three  dimensional images were evaluated at the independent interpreting workstation using the DynaCAD thin client. COMPARISON:  No prior MRI available for comparison. Correlation made with prior mammogram and ultrasound images. FINDINGS: Breast composition: c. Heterogeneous fibroglandular tissue. Background parenchymal enhancement: Mild. Right  breast: In the slightly lower inner quadrant of the right breast, middle depth, there is a round enhancing mass measuring 1.6 cm (series 8, image 65). Susceptibility artifact from the biopsy marking clip is seen along the medial aspect of the mass. Superior and medial to the mass, there is an area of non mass enhancement (series 8, image 62). This corresponds with the asymmetry and calcifications identified mammographically. The mass and non mass enhancement together measure approximately 5.4 x 3.0 x 4.0 cm. Susceptibility artifact is seen within the region of non mass enhancement, corresponding to the second site of biopsy demonstrating malignancy. Left breast: No mass or abnormal enhancement. Susceptibility artifact from the benign biopsy is identified in the lower outer quadrant of the left breast. Lymph nodes: There are numerous asymmetrically enlarged right axillary lymph nodes (at least 8). There is one 4 mm internal mammary lymph node (series 7, image 49). Ancillary findings:  None. IMPRESSION: 1. There is an enhancing mass with adjacent non mass enhancement in the lower-inner right breast, which together measure 5.4 cm. The mass and non mass enhancement correspond with the 2 biopsy-proven areas of malignancy. 2. No other suspicious enhancement is identified in the right breast. 3. There is one 4 mm right internal mammary lymph node with unclear significance as there are no prior MRIs to compare for stability. 4.  Numerous abnormal right axillary lymph nodes. 5.  No evidence of left breast malignancy. RECOMMENDATION: 1. Review of the surgical notes indicates that the patient  is currently planning neoadjuvant chemotherapy followed by mastectomy. If this remains the case, no additional biopsies are necessary. However, if the patient decides to pursue breast conservation, then additional biopsies as described in the initial diagnostic report would be recommended. If those are benign, and breast conservation is desired, than MRI biopsy of distant areas of non mass enhancement in the medial right breast would be recommended to denote extent of disease prior to initiation of neoadjuvant chemotherapy. BI-RADS CATEGORY  6: Known biopsy-proven malignancy. Electronically Signed   By: Ammie Ferrier M.D.   On: 07/07/2020 14:32    Labs:  CBC: Recent Labs    07/08/20 1400  WBC 11.2*  HGB 13.6  HCT 40.4  PLT 333    COAGS: No results for input(s): INR, APTT in the last 8760 hours.  BMP: Recent Labs    07/08/20 1400  NA 139  K 3.9  CL 103  CO2 25  GLUCOSE 101*  BUN 8  CALCIUM 8.8*  CREATININE 0.91  GFRNONAA >60    LIVER FUNCTION TESTS: Recent Labs    07/08/20 1400  BILITOT 0.2*  AST 27  ALT 28  ALKPHOS 51  PROT 7.2  ALBUMIN 4.1    TUMOR MARKERS: No results for input(s): AFPTM, CEA, CA199, CHROMGRNA in the last 8760 hours.  Assessment and Plan:  New dx Rt breast cancer Hx medullary Thyroid cancer Scheduled to start chemotherapy tomorrow Here today for Port a cath placement Risks and benefits of image guided port-a-catheter placement was discussed with the patient including, but not limited to bleeding, infection, pneumothorax, or fibrin sheath development and need for additional procedures.  All of the patient's questions were answered, patient is agreeable to proceed. Consent signed and in chart.   Thank you for this interesting consult.  I greatly enjoyed meeting Kayla Little and look forward to participating in their care.  A copy of this report was sent to the requesting provider on this date.  Electronically Signed: Lesleigh Noe  Nonie Hoyer,  PA-C 07/22/2020, 9:24 AM   I spent a total of  30 Minutes   in face to face in clinical consultation, greater than 50% of which was counseling/coordinating care for [facility] placement

## 2020-07-22 NOTE — Procedures (Signed)
Pre Procedure Dx: Poor venous access; Right sided breast cancer. Post Procedural Dx: Same  Successful placement of left IJ approach port-a-cath with tip at the superior caval atrial junction. The catheter is ready for immediate use.  Estimated Blood Loss: Minimal  Complications: None immediate.  Ronny Bacon, MD Pager #: [phone]

## 2020-07-22 NOTE — Progress Notes (Signed)
Ok to continue Mirena IUD per Dr. Jana Hakim.  Raul Del Harrison, [place], BCPS, BCOP 07/22/2020. 3:31 PM

## 2020-07-22 NOTE — Discharge Instructions (Signed)
Implanted Port Insertion, Care After This sheet gives you information about how to care for yourself after your procedure. Your health care provider may also give you more specific instructions. If you have problems or questions, contact your health care provider. What can I expect after the procedure? After the procedure, it is common to have:  Discomfort at the port insertion site.  Bruising on the skin over the port. This should improve over 3-4 days. Follow these instructions at home: Port care  After your port is placed, you will get a manufacturer's information card. The card has information about your port. Keep this card with you at all times.  Take care of the port as told by your health care provider. Ask your health care provider if you or a family member can get training for taking care of the port at home. A home health care nurse may also take care of the port.  Make sure to remember what type of port you have. Incision care  Follow instructions from your health care provider about how to take care of your port insertion site. Make sure you: ? Wash your hands with soap and water before and after you change your bandage (dressing). If soap and water are not available, use hand sanitizer. ? Change your dressing as told by your health care provider. ? Leave stitches (sutures), skin glue, or adhesive strips in place. These skin closures may need to stay in place for 2 weeks or longer. If adhesive strip edges start to loosen and curl up, you may trim the loose edges. Do not remove adhesive strips completely unless your health care provider tells you to do that.  Check your port insertion site every day for signs of infection. Check for: ? Redness, swelling, or pain. ? Fluid or blood. ? Warmth. ? Pus or a bad smell.      Activity  Return to your normal activities as told by your health care provider. Ask your health care provider what activities are safe for you.  Do not  lift anything that is heavier than 10 lb (4.5 kg), or the limit that you are told, until your health care provider says that it is safe. General instructions  Take over-the-counter and prescription medicines only as told by your health care provider.  Do not take baths, swim, or use a hot tub until your health care provider approves. Ask your health care provider if you may take showers. You may only be allowed to take sponge baths.  Do not drive for 24 hours if you were given a sedative during your procedure.  Wear a medical alert bracelet in case of an emergency. This will tell any health care providers that you have a port.  Keep all follow-up visits as told by your health care provider. This is important. Contact a health care provider if:  You cannot flush your port with saline as directed, or you cannot draw blood from the port.  You have a fever or chills.  You have redness, swelling, or pain around your port insertion site.  You have fluid or blood coming from your port insertion site.  Your port insertion site feels warm to the touch.  You have pus or a bad smell coming from the port insertion site. Get help right away if:  You have chest pain or shortness of breath.  You have bleeding from your port that you cannot control. Summary  Take care of the port as told by your   health care provider. Keep the manufacturer's information card with you at all times.  Change your dressing as told by your health care provider.  Contact a health care provider if you have a fever or chills or if you have redness, swelling, or pain around your port insertion site.  Keep all follow-up visits as told by your health care provider. This information is not intended to replace advice given to you by your health care provider. Make sure you discuss any questions you have with your health care provider. Document Revised: 09/20/2017 Document Reviewed: 09/20/2017 Elsevier Patient Education   2021 [employer]vier Inc.  

## 2020-07-22 NOTE — Progress Notes (Signed)
  Echocardiogram 2D Echocardiogram with 3D and strain has been performed.  Kayla Little M 07/22/2020, 1:48 PM

## 2020-07-23 ENCOUNTER — Encounter: Payer: Self-pay | Admitting: *Deleted

## 2020-07-23 ENCOUNTER — Inpatient Hospital Stay: Payer: 59

## 2020-07-23 ENCOUNTER — Other Ambulatory Visit: Payer: Self-pay | Admitting: Oncology

## 2020-07-23 ENCOUNTER — Telehealth: Payer: Self-pay | Admitting: *Deleted

## 2020-07-23 VITALS — BP 119/78 | HR 78 | Temp 98.4°F | Resp 16

## 2020-07-23 DIAGNOSIS — Z5111 Encounter for antineoplastic chemotherapy: Secondary | ICD-10-CM | POA: Diagnosis present

## 2020-07-23 DIAGNOSIS — Z85850 Personal history of malignant neoplasm of thyroid: Secondary | ICD-10-CM | POA: Diagnosis not present

## 2020-07-23 DIAGNOSIS — M899 Disorder of bone, unspecified: Secondary | ICD-10-CM | POA: Insufficient documentation

## 2020-07-23 DIAGNOSIS — R197 Diarrhea, unspecified: Secondary | ICD-10-CM | POA: Diagnosis not present

## 2020-07-23 DIAGNOSIS — C50211 Malignant neoplasm of upper-inner quadrant of right female breast: Secondary | ICD-10-CM

## 2020-07-23 DIAGNOSIS — F17210 Nicotine dependence, cigarettes, uncomplicated: Secondary | ICD-10-CM | POA: Diagnosis not present

## 2020-07-23 DIAGNOSIS — F988 Other specified behavioral and emotional disorders with onset usually occurring in childhood and adolescence: Secondary | ICD-10-CM | POA: Diagnosis not present

## 2020-07-23 DIAGNOSIS — Z5112 Encounter for antineoplastic immunotherapy: Secondary | ICD-10-CM | POA: Diagnosis present

## 2020-07-23 DIAGNOSIS — L719 Rosacea, unspecified: Secondary | ICD-10-CM | POA: Diagnosis not present

## 2020-07-23 DIAGNOSIS — R590 Localized enlarged lymph nodes: Secondary | ICD-10-CM | POA: Diagnosis not present

## 2020-07-23 DIAGNOSIS — E3120 Multiple endocrine neoplasia [MEN] syndrome, unspecified: Secondary | ICD-10-CM | POA: Diagnosis not present

## 2020-07-23 DIAGNOSIS — Z170 Estrogen receptor positive status [ER+]: Secondary | ICD-10-CM

## 2020-07-23 DIAGNOSIS — Z79899 Other long term (current) drug therapy: Secondary | ICD-10-CM | POA: Diagnosis not present

## 2020-07-23 LAB — COMPREHENSIVE METABOLIC PANEL
ALT: 66 U/L — ABNORMAL HIGH (ref 0–44)
AST: 84 U/L — ABNORMAL HIGH (ref 15–41)
Albumin: 3.8 g/dL (ref 3.5–5.0)
Alkaline Phosphatase: 74 U/L (ref 38–126)
Anion gap: 11 (ref 5–15)
BUN: 8 mg/dL (ref 6–20)
CO2: 22 mmol/L (ref 22–32)
Calcium: 8.3 mg/dL — ABNORMAL LOW (ref 8.9–10.3)
Chloride: 105 mmol/L (ref 98–111)
Creatinine, Ser: 0.74 mg/dL (ref 0.44–1.00)
GFR, Estimated: >60 mL/min (ref >60)
Glucose, Bld: 177 mg/dL — ABNORMAL HIGH (ref 70–99)
Potassium: 3.8 mmol/L (ref 3.5–5.1)
Sodium: 138 mmol/L (ref 135–145)
Total Bilirubin: 0.2 mg/dL — ABNORMAL LOW (ref 0.3–1.2)
Total Protein: 6.8 g/dL (ref 6.5–8.1)

## 2020-07-23 LAB — CBC WITH DIFFERENTIAL/PLATELET
Abs Immature Granulocytes: 0.05 10*3/uL (ref 0.00–0.07)
Basophils Absolute: 0 10*3/uL (ref 0.0–0.1)
Basophils Relative: 0 %
Eosinophils Absolute: 0 10*3/uL (ref 0.0–0.5)
Eosinophils Relative: 0 %
HCT: 39.3 % (ref 36.0–46.0)
Hemoglobin: 13 g/dL (ref 12.0–15.0)
Immature Granulocytes: 0 %
Lymphocytes Relative: 6 %
Lymphs Abs: 0.8 10*3/uL (ref 0.7–4.0)
MCH: 32.9 pg (ref 26.0–34.0)
MCHC: 33.1 g/dL (ref 30.0–36.0)
MCV: 99.5 fL (ref 80.0–100.0)
Monocytes Absolute: 0.7 10*3/uL (ref 0.1–1.0)
Monocytes Relative: 5 %
Neutro Abs: 11.5 10*3/uL — ABNORMAL HIGH (ref 1.7–7.7)
Neutrophils Relative %: 89 %
Platelets: 365 10*3/uL (ref 150–400)
RBC: 3.95 MIL/uL (ref 3.87–5.11)
RDW: 12.6 % (ref 11.5–15.5)
WBC: 13.1 10*3/uL — ABNORMAL HIGH (ref 4.0–10.5)
nRBC: 0 % (ref 0.0–0.2)

## 2020-07-23 LAB — PREGNANCY, URINE: Preg Test, Ur: NEGATIVE

## 2020-07-23 LAB — ECHOCARDIOGRAM COMPLETE
Area-P 1/2: 3.99 cm2
Height: 66 in
S' Lateral: 3.3 cm
Weight: 2560 oz

## 2020-07-23 MED ORDER — TRASTUZUMAB-ANNS CHEMO 150 MG IV SOLR
8.0000 mg/kg | Freq: Once | INTRAVENOUS | Status: AC
  Administered 2020-07-23: 588 mg via INTRAVENOUS
  Filled 2020-07-23: qty 28

## 2020-07-23 MED ORDER — PALONOSETRON HCL INJECTION 0.25 MG/5ML
0.2500 mg | Freq: Once | INTRAVENOUS | Status: AC
  Administered 2020-07-23: 0.25 mg via INTRAVENOUS

## 2020-07-23 MED ORDER — DIPHENHYDRAMINE HCL 25 MG PO CAPS
25.0000 mg | ORAL_CAPSULE | Freq: Once | ORAL | Status: AC
  Administered 2020-07-23: 25 mg via ORAL

## 2020-07-23 MED ORDER — HEPARIN SOD (PORK) LOCK FLUSH 100 UNIT/ML IV SOLN
500.0000 [IU] | Freq: Once | INTRAVENOUS | Status: AC | PRN
  Administered 2020-07-23: 500 [IU]
  Filled 2020-07-23: qty 5

## 2020-07-23 MED ORDER — SODIUM CHLORIDE 0.9% FLUSH
10.0000 mL | INTRAVENOUS | Status: DC | PRN
  Administered 2020-07-23: 10 mL
  Filled 2020-07-23: qty 10

## 2020-07-23 MED ORDER — ACETAMINOPHEN 325 MG PO TABS
650.0000 mg | ORAL_TABLET | Freq: Once | ORAL | Status: AC
Start: 2020-07-23 — End: 2020-07-23
  Administered 2020-07-23: 650 mg via ORAL

## 2020-07-23 MED ORDER — SODIUM CHLORIDE 0.9 % IV SOLN
Freq: Once | INTRAVENOUS | Status: AC
  Filled 2020-07-23: qty 250

## 2020-07-23 MED ORDER — DIPHENHYDRAMINE HCL 25 MG PO CAPS
ORAL_CAPSULE | ORAL | Status: AC
  Filled 2020-07-23: qty 1

## 2020-07-23 MED ORDER — ACETAMINOPHEN 325 MG PO TABS
ORAL_TABLET | ORAL | Status: AC
  Filled 2020-07-23: qty 2

## 2020-07-23 MED ORDER — PALONOSETRON HCL INJECTION 0.25 MG/5ML
INTRAVENOUS | Status: AC
  Filled 2020-07-23: qty 5

## 2020-07-23 MED ORDER — SODIUM CHLORIDE 0.9 % IV SOLN
695.0000 mg | Freq: Once | INTRAVENOUS | Status: AC
  Administered 2020-07-23: 700 mg via INTRAVENOUS
  Filled 2020-07-23: qty 70

## 2020-07-23 MED ORDER — SODIUM CHLORIDE 0.9 % IV SOLN
150.0000 mg | Freq: Once | INTRAVENOUS | Status: AC
  Administered 2020-07-23: 150 mg via INTRAVENOUS
  Filled 2020-07-23: qty 150

## 2020-07-23 MED ORDER — SODIUM CHLORIDE 0.9 % IV SOLN
75.0000 mg/m2 | Freq: Once | INTRAVENOUS | Status: AC
  Administered 2020-07-23: 140 mg via INTRAVENOUS
  Filled 2020-07-23: qty 14

## 2020-07-23 MED ORDER — SODIUM CHLORIDE 0.9 % IV SOLN
840.0000 mg | Freq: Once | INTRAVENOUS | Status: AC
  Administered 2020-07-23: 840 mg via INTRAVENOUS
  Filled 2020-07-23: qty 28

## 2020-07-23 MED ORDER — SODIUM CHLORIDE 0.9 % IV SOLN
10.0000 mg | Freq: Once | INTRAVENOUS | Status: AC
  Administered 2020-07-23: 10 mg via INTRAVENOUS
  Filled 2020-07-23: qty 10

## 2020-07-23 NOTE — Patient Instructions (Signed)
[facility]    Discharge Instructions: Thank you for choosing [facility] to provide your oncology and hematology care.   If you have a lab appointment with the [facility], please go directly to the [facility] and check in at the registration area.   Wear comfortable clothing and clothing appropriate for easy access to any Portacath or PICC line.   We strive to give you quality time with your provider. You may need to reschedule your appointment if you arrive late (15 or more minutes).  Arriving late affects you and other patients whose appointments are after yours.  Also, if you miss three or more appointments without notifying the office, you may be dismissed from the clinic at the provider's discretion.      For prescription refill requests, have your pharmacy contact our office and allow 72 hours for refills to be completed.    Today you received the following chemotherapy and/or immunotherapy agents: trastuzumab, pertuzumab, docetaxel, and carboplatin.      To help prevent nausea and vomiting after your treatment, we encourage you to take your nausea medication as directed.  BELOW ARE SYMPTOMS THAT SHOULD BE REPORTED IMMEDIATELY: . *FEVER GREATER THAN 100.4 F (38 C) OR HIGHER . *CHILLS OR SWEATING . *NAUSEA AND VOMITING THAT IS NOT CONTROLLED WITH YOUR NAUSEA MEDICATION . *UNUSUAL SHORTNESS OF BREATH . *UNUSUAL BRUISING OR BLEEDING . *URINARY PROBLEMS (pain or burning when urinating, or frequent urination) . *BOWEL PROBLEMS (unusual diarrhea, constipation, pain near the anus) . TENDERNESS IN MOUTH AND THROAT WITH OR WITHOUT PRESENCE OF ULCERS (sore throat, sores in mouth, or a toothache) . UNUSUAL RASH, SWELLING OR PAIN  . UNUSUAL VAGINAL DISCHARGE OR ITCHING   Items with * indicate a potential emergency and should be followed up as soon as possible or go to the Emergency Department if any problems should occur.  Please show the  CHEMOTHERAPY ALERT CARD or IMMUNOTHERAPY ALERT CARD at check-in to the Emergency Department and triage nurse.  Should you have questions after your visit or need to cancel or reschedule your appointment, please contact [facility]  Dept: [phone]  and follow the prompts.  Office hours are 8:00 a.m. to 4:30 p.m. Monday - Friday. Please note that voicemails left after 4:00 p.m. may not be returned until the following business day.  We are closed weekends and major holidays. You have access to a nurse at all times for urgent questions. Please call the main number to the clinic Dept: [phone] and follow the prompts.   For any non-urgent questions, you may also contact your provider using MyChart. We now offer e-Visits for anyone 48 and older to request care online for non-urgent symptoms. For details visit [email or web address].   Also download the MyChart app! Go to the app store, search "MyChart", open the app, select [facility], and log in with your MyChart username and password.  Due to Covid, a mask is required upon entering the hospital/clinic. If you do not have a mask, one will be given to you upon arrival. For doctor visits, patients may have 1 support person aged 31 or older with them. For treatment visits, patients cannot have anyone with them due to current Covid guidelines and our immunocompromised population.   Trastuzumab injection for infusion What is this medicine? TRASTUZUMAB (tras TOO zoo mab) is a monoclonal antibody. It is used to treat breast cancer and stomach cancer. This medicine may be used for  other purposes; ask your health care provider or pharmacist if you have questions. COMMON BRAND NAME(S): Herceptin, Galvin Proffer, Trazimera What should I tell my health care provider before I take this medicine? They need to know if you have any of these conditions:  heart disease  heart failure  lung or breathing  disease, like asthma  an unusual or allergic reaction to trastuzumab, benzyl alcohol, or other medications, foods, dyes, or preservatives  pregnant or trying to get pregnant  breast-feeding How should I use this medicine? This drug is given as an infusion into a vein. It is administered in a hospital or clinic by a specially trained health care professional. Talk to your pediatrician regarding the use of this medicine in children. This medicine is not approved for use in children. Overdosage: If you think you have taken too much of this medicine contact a poison control center or emergency room at once. NOTE: This medicine is only for you. Do not share this medicine with others. What if I miss a dose? It is important not to miss a dose. Call your doctor or health care professional if you are unable to keep an appointment. What may interact with this medicine? This medicine may interact with the following medications:  certain types of chemotherapy, such as daunorubicin, doxorubicin, epirubicin, and idarubicin This list may not describe all possible interactions. Give your health care provider a list of all the medicines, herbs, non-prescription drugs, or dietary supplements you use. Also tell them if you smoke, drink alcohol, or use illegal drugs. Some items may interact with your medicine. What should I watch for while using this medicine? Visit your doctor for checks on your progress. Report any side effects. Continue your course of treatment even though you feel ill unless your doctor tells you to stop. Call your doctor or health care professional for advice if you get a fever, chills or sore throat, or other symptoms of a cold or flu. Do not treat yourself. Try to avoid being around people who are sick. You may experience fever, chills and shaking during your first infusion. These effects are usually mild and can be treated with other medicines. Report any side effects during the infusion  to your health care professional. Fever and chills usually do not happen with later infusions. Do not become pregnant while taking this medicine or for 7 months after stopping it. Women should inform their doctor if they wish to become pregnant or think they might be pregnant. Women of child-bearing potential will need to have a negative pregnancy test before starting this medicine. There is a potential for serious side effects to an unborn child. Talk to your health care professional or pharmacist for more information. Do not breast-feed an infant while taking this medicine or for 7 months after stopping it. Women must use effective birth control with this medicine. What side effects may I notice from receiving this medicine? Side effects that you should report to your doctor or health care professional as soon as possible:  allergic reactions like skin rash, itching or hives, swelling of the face, lips, or tongue  chest pain or palpitations  cough  dizziness  feeling faint or lightheaded, falls  fever  general ill feeling or flu-like symptoms  signs of worsening heart failure like breathing problems; swelling in your legs and feet  unusually weak or tired Side effects that usually do not require medical attention (report to your doctor or health care professional if they  continue or are bothersome):  bone pain  changes in taste  diarrhea  joint pain  nausea/vomiting  weight loss This list may not describe all possible side effects. Call your doctor for medical advice about side effects. You may report side effects to FDA at 1-800-FDA-1088. Where should I keep my medicine? This drug is given in a hospital or clinic and will not be stored at home. NOTE: This sheet is a summary. It may not cover all possible information. If you have questions about this medicine, talk to your doctor, pharmacist, or health care provider.  2021 Elsevier/Gold Standard (2016-02-17  14:37:52)  Pertuzumab injection What is this medicine? PERTUZUMAB (per TOOZ ue mab) is a monoclonal antibody. It is used to treat breast cancer. This medicine may be used for other purposes; ask your health care provider or pharmacist if you have questions. COMMON BRAND NAME(S): PERJETA What should I tell my health care provider before I take this medicine? They need to know if you have any of these conditions:  heart disease  heart failure  high blood pressure  history of irregular heart beat  recent or ongoing radiation therapy  an unusual or allergic reaction to pertuzumab, other medicines, foods, dyes, or preservatives  pregnant or trying to get pregnant  breast-feeding How should I use this medicine? This medicine is for infusion into a vein. It is given by a health care professional in a hospital or clinic setting. Talk to your pediatrician regarding the use of this medicine in children. Special care may be needed. Overdosage: If you think you have taken too much of this medicine contact a poison control center or emergency room at once. NOTE: This medicine is only for you. Do not share this medicine with others. What if I miss a dose? It is important not to miss your dose. Call your doctor or health care professional if you are unable to keep an appointment. What may interact with this medicine? Interactions are not expected. Give your health care provider a list of all the medicines, herbs, non-prescription drugs, or dietary supplements you use. Also tell them if you smoke, drink alcohol, or use illegal drugs. Some items may interact with your medicine. This list may not describe all possible interactions. Give your health care provider a list of all the medicines, herbs, non-prescription drugs, or dietary supplements you use. Also tell them if you smoke, drink alcohol, or use illegal drugs. Some items may interact with your medicine. What should I watch for while using  this medicine? Your condition will be monitored carefully while you are receiving this medicine. Report any side effects. Continue your course of treatment even though you feel ill unless your doctor tells you to stop. Do not become pregnant while taking this medicine or for 7 months after stopping it. Women should inform their doctor if they wish to become pregnant or think they might be pregnant. Women of child-bearing potential will need to have a negative pregnancy test before starting this medicine. There is a potential for serious side effects to an unborn child. Talk to your health care professional or pharmacist for more information. Do not breast-feed an infant while taking this medicine or for 7 months after stopping it. Women must use effective birth control with this medicine. Call your doctor or health care professional for advice if you get a fever, chills or sore throat, or other symptoms of a cold or flu. Do not treat yourself. Try to avoid being around people  who are sick. You may experience fever, chills, and headache during the infusion. Report any side effects during the infusion to your health care professional. What side effects may I notice from receiving this medicine? Side effects that you should report to your doctor or health care professional as soon as possible:  breathing problems  chest pain or palpitations  dizziness  feeling faint or lightheaded  fever or chills  skin rash, itching or hives  sore throat  swelling of the face, lips, or tongue  swelling of the legs or ankles  unusually weak or tired Side effects that usually do not require medical attention (report to your doctor or health care professional if they continue or are bothersome):  diarrhea  hair loss  nausea, vomiting  tiredness This list may not describe all possible side effects. Call your doctor for medical advice about side effects. You may report side effects to FDA at  1-800-FDA-1088. Where should I keep my medicine? This drug is given in a hospital or clinic and will not be stored at home. NOTE: This sheet is a summary. It may not cover all possible information. If you have questions about this medicine, talk to your doctor, pharmacist, or health care provider.  2021 Elsevier/Gold Standard (2015-03-27 12:08:50)  Docetaxel injection What is this medicine? DOCETAXEL (doe se TAX el) is a chemotherapy drug. It targets fast dividing cells, like cancer cells, and causes these cells to die. This medicine is used to treat many types of cancers like breast cancer, certain stomach cancers, head and neck cancer, lung cancer, and prostate cancer. This medicine may be used for other purposes; ask your health care provider or pharmacist if you have questions. COMMON BRAND NAME(S): Docefrez, Taxotere What should I tell my health care provider before I take this medicine? They need to know if you have any of these conditions:  infection (especially a virus infection such as chickenpox, cold sores, or herpes)  liver disease  low blood counts, like low white cell, platelet, or red cell counts  an unusual or allergic reaction to docetaxel, polysorbate 80, other chemotherapy agents, other medicines, foods, dyes, or preservatives  pregnant or trying to get pregnant  breast-feeding How should I use this medicine? This drug is given as an infusion into a vein. It is administered in a hospital or clinic by a specially trained health care professional. Talk to your pediatrician regarding the use of this medicine in children. Special care may be needed. Overdosage: If you think you have taken too much of this medicine contact a poison control center or emergency room at once. NOTE: This medicine is only for you. Do not share this medicine with others. What if I miss a dose? It is important not to miss your dose. Call your doctor or health care professional if you are unable  to keep an appointment. What may interact with this medicine? Do not take this medicine with any of the following medications:  live virus vaccines This medicine may also interact with the following medications:  aprepitant  certain antibiotics like erythromycin or clarithromycin  certain antivirals for HIV or hepatitis  certain medicines for fungal infections like fluconazole, itraconazole, ketoconazole, posaconazole, or voriconazole  cimetidine  ciprofloxacin  conivaptan  cyclosporine  dronedarone  fluvoxamine  grapefruit juice  imatinib  verapamil This list may not describe all possible interactions. Give your health care provider a list of all the medicines, herbs, non-prescription drugs, or dietary supplements you use. Also tell them  if you smoke, drink alcohol, or use illegal drugs. Some items may interact with your medicine. What should I watch for while using this medicine? Your condition will be monitored carefully while you are receiving this medicine. You will need important blood work done while you are taking this medicine. Call your doctor or health care professional for advice if you get a fever, chills or sore throat, or other symptoms of a cold or flu. Do not treat yourself. This drug decreases your body's ability to fight infections. Try to avoid being around people who are sick. Some products may contain alcohol. Ask your health care professional if this medicine contains alcohol. Be sure to tell all health care professionals you are taking this medicine. Certain medicines, like metronidazole and disulfiram, can cause an unpleasant reaction when taken with alcohol. The reaction includes flushing, headache, nausea, vomiting, sweating, and increased thirst. The reaction can last from 30 minutes to several hours. You may get drowsy or dizzy. Do not drive, use machinery, or do anything that needs mental alertness until you know how this medicine affects you. Do not  stand or sit up quickly, especially if you are an older patient. This reduces the risk of dizzy or fainting spells. Alcohol may interfere with the effect of this medicine. Talk to your health care professional about your risk of cancer. You may be more at risk for certain types of cancer if you take this medicine. Do not become pregnant while taking this medicine or for 6 months after stopping it. Women should inform their doctor if they wish to become pregnant or think they might be pregnant. There is a potential for serious side effects to an unborn child. Talk to your health care professional or pharmacist for more information. Do not breast-feed an infant while taking this medicine or for 1 week after stopping it. Males who get this medicine must use a condom during sex with females who can get pregnant. If you get a woman pregnant, the baby could have birth defects. The baby could die before they are born. You will need to continue wearing a condom for 3 months after stopping the medicine. Tell your health care provider right away if your partner becomes pregnant while you are taking this medicine. This may interfere with the ability to father a child. You should talk to your doctor or health care professional if you are concerned about your fertility. What side effects may I notice from receiving this medicine? Side effects that you should report to your doctor or health care professional as soon as possible:  allergic reactions like skin rash, itching or hives, swelling of the face, lips, or tongue  blurred vision  breathing problems  changes in vision  low blood counts - This drug may decrease the number of white blood cells, red blood cells and platelets. You may be at increased risk for infections and bleeding.  nausea and vomiting  pain, redness or irritation at site where injected  pain, tingling, numbness in the hands or feet  redness, blistering, peeling, or loosening of the  skin, including inside the mouth  signs of decreased platelets or bleeding - bruising, pinpoint red spots on the skin, black, tarry stools, nosebleeds  signs of decreased red blood cells - unusually weak or tired, fainting spells, lightheadedness  signs of infection - fever or chills, cough, sore throat, pain or difficulty passing urine  swelling of the ankle, feet, hands Side effects that usually do not require medical  attention (report to your doctor or health care professional if they continue or are bothersome):  constipation  diarrhea  fingernail or toenail changes  hair loss  loss of appetite  mouth sores  muscle pain This list may not describe all possible side effects. Call your doctor for medical advice about side effects. You may report side effects to FDA at 1-800-FDA-1088. Where should I keep my medicine? This drug is given in a hospital or clinic and will not be stored at home. NOTE: This sheet is a summary. It may not cover all possible information. If you have questions about this medicine, talk to your doctor, pharmacist, or health care provider.  2021 Elsevier/Gold Standard (2019-01-22 19:50:31)  Carboplatin injection What is this medicine? CARBOPLATIN (KAR boe pla tin) is a chemotherapy drug. It targets fast dividing cells, like cancer cells, and causes these cells to die. This medicine is used to treat ovarian cancer and many other cancers. This medicine may be used for other purposes; ask your health care provider or pharmacist if you have questions. COMMON BRAND NAME(S): Paraplatin What should I tell my health care provider before I take this medicine? They need to know if you have any of these conditions:  blood disorders  hearing problems  kidney disease  recent or ongoing radiation therapy  an unusual or allergic reaction to carboplatin, cisplatin, other chemotherapy, other medicines, foods, dyes, or preservatives  pregnant or trying to get  pregnant  breast-feeding How should I use this medicine? This drug is usually given as an infusion into a vein. It is administered in a hospital or clinic by a specially trained health care professional. Talk to your pediatrician regarding the use of this medicine in children. Special care may be needed. Overdosage: If you think you have taken too much of this medicine contact a poison control center or emergency room at once. NOTE: This medicine is only for you. Do not share this medicine with others. What if I miss a dose? It is important not to miss a dose. Call your doctor or health care professional if you are unable to keep an appointment. What may interact with this medicine?  medicines for seizures  medicines to increase blood counts like filgrastim, pegfilgrastim, sargramostim  some antibiotics like amikacin, gentamicin, neomycin, streptomycin, tobramycin  vaccines Talk to your doctor or health care professional before taking any of these medicines:  acetaminophen  aspirin  ibuprofen  ketoprofen  naproxen This list may not describe all possible interactions. Give your health care provider a list of all the medicines, herbs, non-prescription drugs, or dietary supplements you use. Also tell them if you smoke, drink alcohol, or use illegal drugs. Some items may interact with your medicine. What should I watch for while using this medicine? Your condition will be monitored carefully while you are receiving this medicine. You will need important blood work done while you are taking this medicine. This drug may make you feel generally unwell. This is not uncommon, as chemotherapy can affect healthy cells as well as cancer cells. Report any side effects. Continue your course of treatment even though you feel ill unless your doctor tells you to stop. In some cases, you may be given additional medicines to help with side effects. Follow all directions for their use. Call your  doctor or health care professional for advice if you get a fever, chills or sore throat, or other symptoms of a cold or flu. Do not treat yourself. This drug decreases your  body's ability to fight infections. Try to avoid being around people who are sick. This medicine may increase your risk to bruise or bleed. Call your doctor or health care professional if you notice any unusual bleeding. Be careful brushing and flossing your teeth or using a toothpick because you may get an infection or bleed more easily. If you have any dental work done, tell your dentist you are receiving this medicine. Avoid taking products that contain aspirin, acetaminophen, ibuprofen, naproxen, or ketoprofen unless instructed by your doctor. These medicines may hide a fever. Do not become pregnant while taking this medicine. Women should inform their doctor if they wish to become pregnant or think they might be pregnant. There is a potential for serious side effects to an unborn child. Talk to your health care professional or pharmacist for more information. Do not breast-feed an infant while taking this medicine. What side effects may I notice from receiving this medicine? Side effects that you should report to your doctor or health care professional as soon as possible:  allergic reactions like skin rash, itching or hives, swelling of the face, lips, or tongue  signs of infection - fever or chills, cough, sore throat, pain or difficulty passing urine  signs of decreased platelets or bleeding - bruising, pinpoint red spots on the skin, black, tarry stools, nosebleeds  signs of decreased red blood cells - unusually weak or tired, fainting spells, lightheadedness  breathing problems  changes in hearing  changes in vision  chest pain  high blood pressure  low blood counts - This drug may decrease the number of white blood cells, red blood cells and platelets. You may be at increased risk for infections and  bleeding.  nausea and vomiting  pain, swelling, redness or irritation at the injection site  pain, tingling, numbness in the hands or feet  problems with balance, talking, walking  trouble passing urine or change in the amount of urine Side effects that usually do not require medical attention (report to your doctor or health care professional if they continue or are bothersome):  hair loss  loss of appetite  metallic taste in the mouth or changes in taste This list may not describe all possible side effects. Call your doctor for medical advice about side effects. You may report side effects to FDA at 1-800-FDA-1088. Where should I keep my medicine? This drug is given in a hospital or clinic and will not be stored at home. NOTE: This sheet is a summary. It may not cover all possible information. If you have questions about this medicine, talk to your doctor, pharmacist, or health care provider.  2021 Elsevier/Gold Standard (2007-05-30 14:38:05)

## 2020-07-23 NOTE — Progress Notes (Signed)
Kayla Little's bone scan showed uptake at T12 but no correlate on CT scan.  We will going to do an MRI of the thoracic spine to evaluate further

## 2020-07-23 NOTE — Telephone Encounter (Signed)
ECHO done yesterday has not been read for therapy today.  Per contact with vascular lab this am- stat request to be read - with reading provider aware of urgency - states will read first ( has 10 echos from yesterday waiting to be read ).  Per review with MD- reccommended to proceed with treatment except hold the trastuzumab until echo is read- and if needed can not give today but wait until cycle 2.

## 2020-07-23 NOTE — Progress Notes (Signed)
Per Dr. Jana Hakim, ok to proceed with elevated LFT's. No additional orders received.

## 2020-07-24 ENCOUNTER — Telehealth: Payer: Self-pay | Admitting: *Deleted

## 2020-07-24 ENCOUNTER — Inpatient Hospital Stay: Payer: 59 | Admitting: Oncology

## 2020-07-25 ENCOUNTER — Ambulatory Visit: Payer: 59

## 2020-07-25 ENCOUNTER — Other Ambulatory Visit: Payer: Self-pay

## 2020-07-25 ENCOUNTER — Encounter: Payer: Self-pay | Admitting: *Deleted

## 2020-07-25 ENCOUNTER — Inpatient Hospital Stay: Payer: 59

## 2020-07-25 VITALS — BP 121/93 | HR 69 | Temp 98.4°F | Resp 16

## 2020-07-25 DIAGNOSIS — Z5111 Encounter for antineoplastic chemotherapy: Secondary | ICD-10-CM | POA: Diagnosis not present

## 2020-07-25 DIAGNOSIS — C50211 Malignant neoplasm of upper-inner quadrant of right female breast: Secondary | ICD-10-CM

## 2020-07-25 DIAGNOSIS — Z170 Estrogen receptor positive status [ER+]: Secondary | ICD-10-CM

## 2020-07-25 MED ORDER — PEGFILGRASTIM-BMEZ 6 MG/0.6ML ~~LOC~~ SOSY
PREFILLED_SYRINGE | SUBCUTANEOUS | Status: AC
  Filled 2020-07-25: qty 0.6

## 2020-07-25 MED ORDER — PEGFILGRASTIM-BMEZ 6 MG/0.6ML ~~LOC~~ SOSY
6.0000 mg | PREFILLED_SYRINGE | Freq: Once | SUBCUTANEOUS | Status: AC
Start: 2020-07-25 — End: 2020-07-25
  Administered 2020-07-25: 6 mg via SUBCUTANEOUS

## 2020-07-25 NOTE — Patient Instructions (Signed)
Pegfilgrastim injection What is this medicine? PEGFILGRASTIM (PEG fil gra stim) is a long-acting granulocyte colony-stimulating factor that stimulates the growth of neutrophils, a type of white blood cell important in the body's fight against infection. It is used to reduce the incidence of fever and infection in patients with certain types of cancer who are receiving chemotherapy that affects the bone marrow, and to increase survival after being exposed to high doses of radiation. This medicine may be used for other purposes; ask your health care provider or pharmacist if you have questions. COMMON BRAND NAME(S): Fulphila, Neulasta, Nyvepria, UDENYCA, Ziextenzo What should I tell my health care provider before I take this medicine? They need to know if you have any of these conditions:  kidney disease  latex allergy  ongoing radiation therapy  sickle cell disease  skin reactions to acrylic adhesives (On-Body Injector only)  an unusual or allergic reaction to pegfilgrastim, filgrastim, other medicines, foods, dyes, or preservatives  pregnant or trying to get pregnant  breast-feeding How should I use this medicine? This medicine is for injection under the skin. If you get this medicine at home, you will be taught how to prepare and give the pre-filled syringe or how to use the On-body Injector. Refer to the patient Instructions for Use for detailed instructions. Use exactly as directed. Tell your healthcare provider immediately if you suspect that the On-body Injector may not have performed as intended or if you suspect the use of the On-body Injector resulted in a missed or partial dose. It is important that you put your used needles and syringes in a special sharps container. Do not put them in a trash can. If you do not have a sharps container, call your pharmacist or healthcare provider to get one. Talk to your pediatrician regarding the use of this medicine in children. While this drug  may be prescribed for selected conditions, precautions do apply. Overdosage: If you think you have taken too much of this medicine contact a poison control center or emergency room at once. NOTE: This medicine is only for you. Do not share this medicine with others. What if I miss a dose? It is important not to miss your dose. Call your doctor or health care professional if you miss your dose. If you miss a dose due to an On-body Injector failure or leakage, a new dose should be administered as soon as possible using a single prefilled syringe for manual use. What may interact with this medicine? Interactions have not been studied. This list may not describe all possible interactions. Give your health care provider a list of all the medicines, herbs, non-prescription drugs, or dietary supplements you use. Also tell them if you smoke, drink alcohol, or use illegal drugs. Some items may interact with your medicine. What should I watch for while using this medicine? Your condition will be monitored carefully while you are receiving this medicine. You may need blood work done while you are taking this medicine. Talk to your health care provider about your risk of cancer. You may be more at risk for certain types of cancer if you take this medicine. If you are going to need a MRI, CT scan, or other procedure, tell your doctor that you are using this medicine (On-Body Injector only). What side effects may I notice from receiving this medicine? Side effects that you should report to your doctor or health care professional as soon as possible:  allergic reactions (skin rash, itching or hives, swelling of   the face, lips, or tongue)  back pain  dizziness  fever  pain, redness, or irritation at site where injected  pinpoint red spots on the skin  red or dark-brown urine  shortness of breath or breathing problems  stomach or side pain, or pain at the shoulder  swelling  tiredness  trouble  passing urine or change in the amount of urine  unusual bruising or bleeding Side effects that usually do not require medical attention (report to your doctor or health care professional if they continue or are bothersome):  bone pain  muscle pain This list may not describe all possible side effects. Call your doctor for medical advice about side effects. You may report side effects to FDA at 1-800-FDA-1088. Where should I keep my medicine? Keep out of the reach of children. If you are using this medicine at home, you will be instructed on how to store it. Throw away any unused medicine after the expiration date on the label. NOTE: This sheet is a summary. It may not cover all possible information. If you have questions about this medicine, talk to your doctor, pharmacist, or health care provider.  2021 [employer] (2019-03-16 13:20:51)  

## 2020-07-27 ENCOUNTER — Ambulatory Visit (HOSPITAL_COMMUNITY)
Admission: RE | Admit: 2020-07-27 | Discharge: 2020-07-27 | Disposition: A | Discharge status: Home / Self Care | Payer: 59 | Source: Ambulatory Visit | Attending: Oncology | Admitting: Oncology

## 2020-07-27 ENCOUNTER — Other Ambulatory Visit: Payer: Self-pay

## 2020-07-27 DIAGNOSIS — M899 Disorder of bone, unspecified: Secondary | ICD-10-CM | POA: Insufficient documentation

## 2020-07-27 IMAGING — MR MR THORACIC SPINE WO/W CM
9 of 20 series · 17 of 48 positions shown · IV contrast (gadavist)
Comparison: Head CT [DATE]

CLINICAL DATA: Breast cancer

EXAM:
MRI THORACIC WITHOUT AND WITH CONTRAST
TECHNIQUE: Multiplanar and multiecho pulse sequences of the thoracic spine were
obtained without and with intravenous contrast.
CONTRAST:  7mL GADAVIST GADOBUTROL 1 MMOL/ML IV SOLN

[Series 16: T1 · sagittal · 4.0mm · 1.72mm/px · 1 of 13 slices shown (1 of 3)]
[im 1/13]
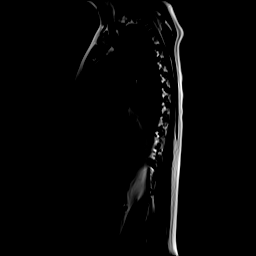

[Series 17: STIR · sagittal · 3.0mm · 1.06mm/px · 1 of 17 slices shown]
[im 1/17]
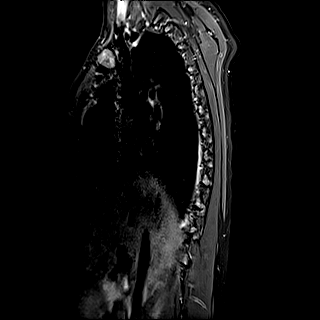

[Series 18: T1 · sagittal · 3.0mm · 1.06mm/px · 1 of 15 slices shown (2 of 3)]
[im 1/15]
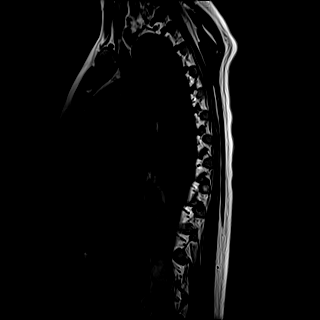

[Series 19: T2 · axial · 4.0mm · 0.78mm/px · z∈[-243,-8]mm · 3 of 39 slices shown]
[im 1/39]
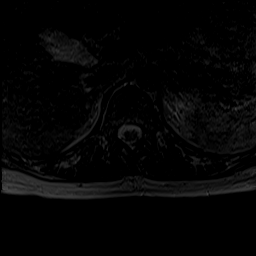
[im 20/39]
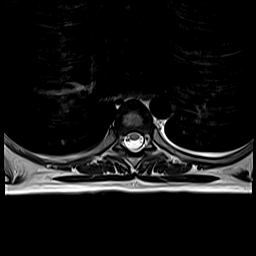
[im 39/39]
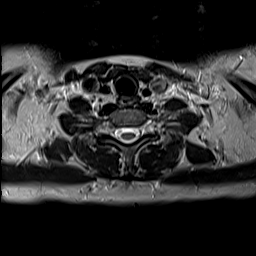

[Series 20: t2_me2d_tra · axial · 4.0mm · 0.39mm/px · z∈[-243,-8]mm · 3 of 39 slices shown]
[im 1/39]
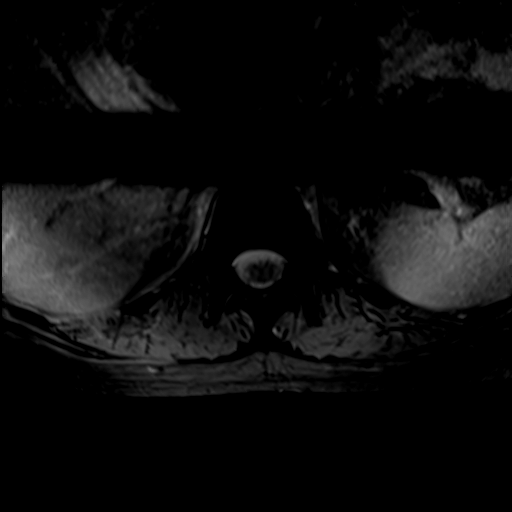
[im 20/39]
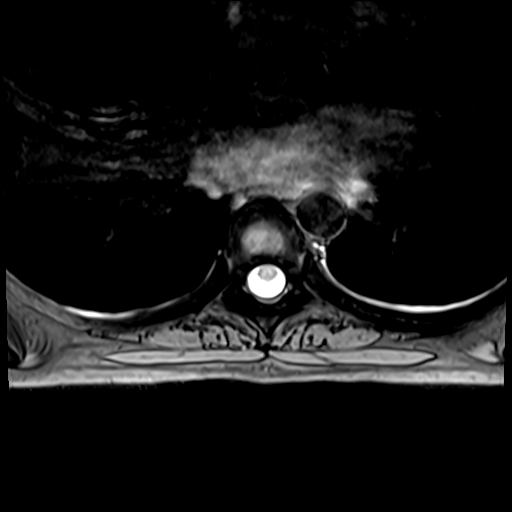
[im 39/39]
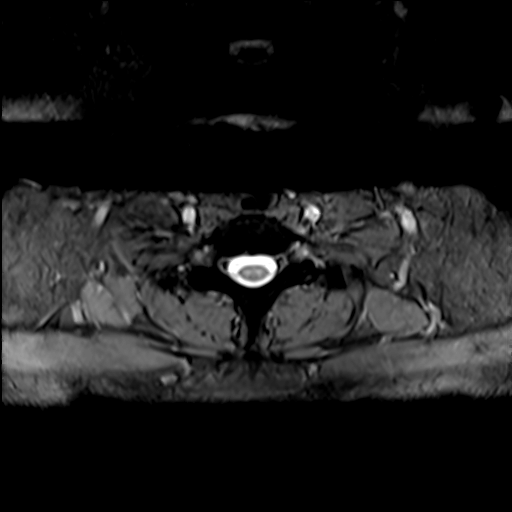

[Series 21: T1 · axial · 4.0mm · 0.39mm/px · z∈[-243,-8]mm · 3 of 39 slices shown (3 of 3)]
[im 1/39]
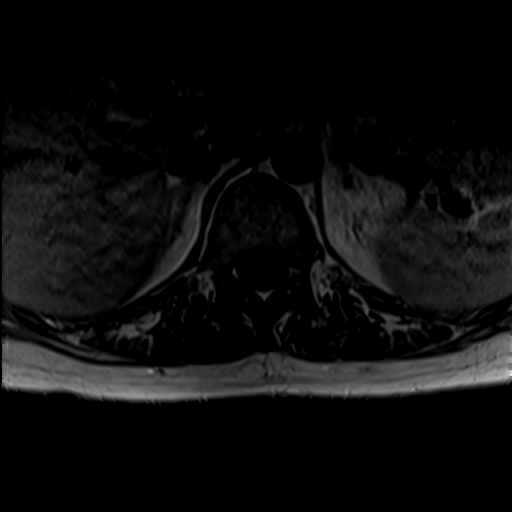
[im 20/39]
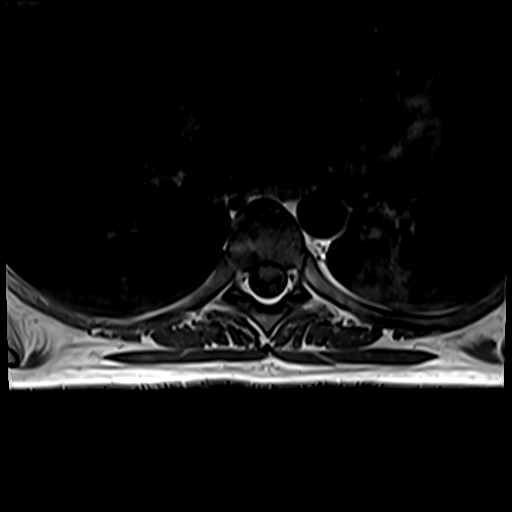
[im 39/39]
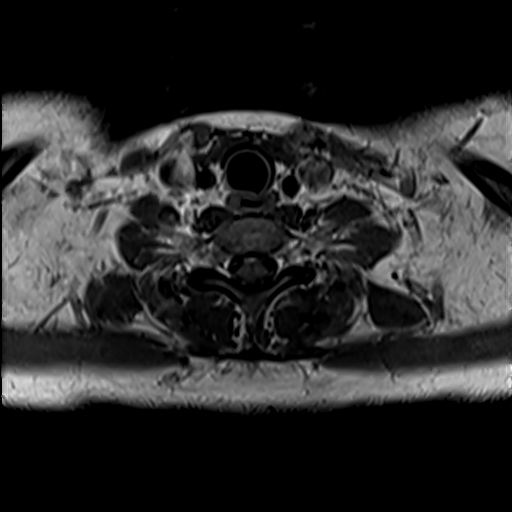

[Series 22: T2 post-contrast · sagittal · 3.0mm · 0.89mm/px · 1 of 17 slices shown]
[im 1/17]
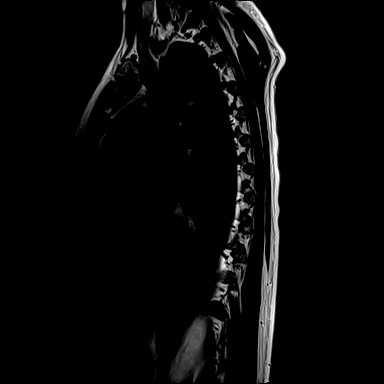

[Series 23: T1 fat-sat post-contrast · sagittal · 3.0mm · 1.06mm/px · 1 of 17 slices shown]
[im 1/17]
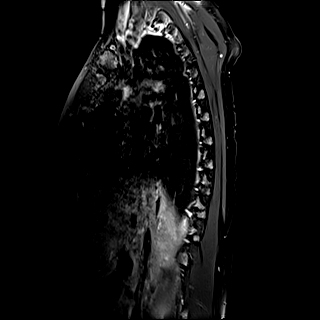

[Series 24: T1 post-contrast · axial · 4.0mm · 0.39mm/px · z∈[-243,-8]mm · 3 of 39 slices shown]
[im 1/39]
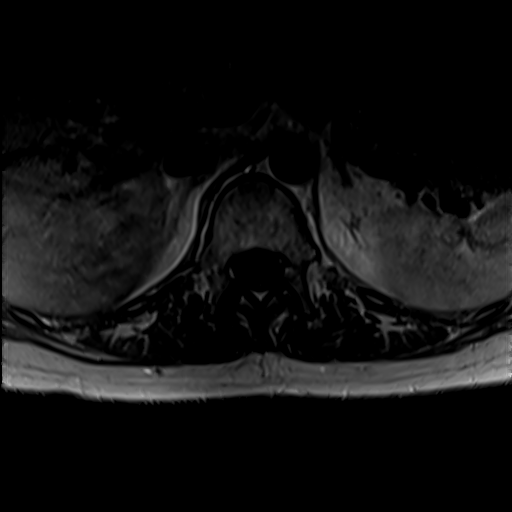
[im 20/39]
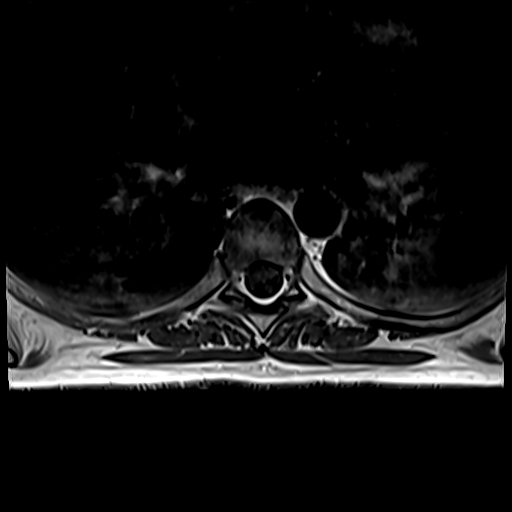
[im 39/39]
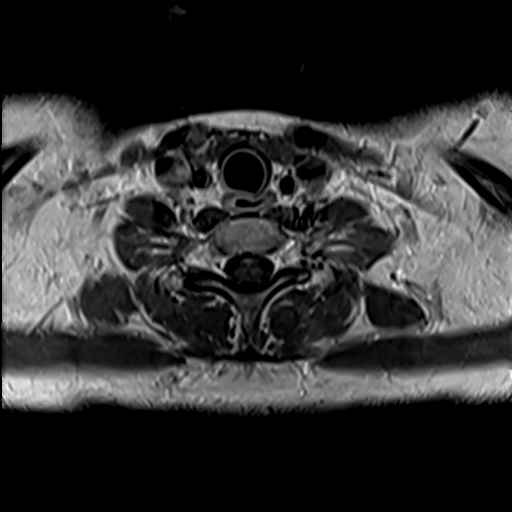

[17 of 48 positions shown; findings below may reference images not displayed]

FINDINGS: Alignment:  Normal

Vertebrae: Approximately 1 cm T1 and T2 hypointensity along the left
upper aspect of the T12 vertebral body, adjacent to the disc space.
Location adjacent to disc and a peripheral fatty marrow signal with
superimposed edematous signal favors a discogenic/degenerative
cause.

T7 body bone island.  T10 hemangioma.

No fracture, discitis, or aggressive bone lesion.

Cord:  Negative

Paraspinal and other soft tissues: Negative no additional findings
relative to prior PET-CT.

Disc levels:

T7-8 right paracentral protrusion without neural compression.
IMPRESSION: Indeterminate T12 signal abnormality correlating with the prior
PET-CT. Proximity to the disc and peripheral fatty marrow conversion
favors a Schmorl's node/focal endplate degeneration rather than
metastasis, but recommend attention on follow-up.

## 2020-07-27 MED ORDER — GADOBUTROL 1 MMOL/ML IV SOLN
7.0000 mL | Freq: Once | INTRAVENOUS | Status: AC | PRN
  Administered 2020-07-27: 7 mL via INTRAVENOUS

## 2020-07-28 ENCOUNTER — Encounter: Payer: Self-pay | Admitting: *Deleted

## 2020-07-28 ENCOUNTER — Other Ambulatory Visit: Payer: Self-pay | Admitting: Oncology

## 2020-07-29 ENCOUNTER — Other Ambulatory Visit: Payer: Self-pay | Admitting: Oncology

## 2020-07-29 NOTE — Progress Notes (Signed)
[place]  [phone] [phone]     ID: Kayla Little DOB: 04-12-1985  MR#: [id number]  KGM#:[id number]  Patient Care Team: [name] [place] as PCP - General (Family Medicine) Roopa Graver, Virgie Dad, MD as Consulting Physician (Oncology) Kyung Rudd, MD as Consulting Physician (Radiation Oncology) Armandina Gemma, MD as Consulting Physician (General Surgery) Rockwell Germany, RN as Registered Nurse Mauro Kaufmann, RN as Registered Nurse Chauncey Cruel, MD OTHER MD:  CHIEF COMPLAINT: Triple positive locally advanced breast cancer  CURRENT TREATMENT: Neoadjuvant chemotherapy   INTERVAL HISTORY: Kayla Little returns today for follow up and treatment of her locally advanced breast cancer. She was evaluated in the breast cancer clinic on 07/08/2020.  Since consultation, she underwent PET scan on 07/21/2020 showing: 1.5 cm hypermetabolic mass in medial right breast, consistent with known primary carcinoma; mild hypermetabolic lymphadenopathy in right supraclavicular, subpectoral, axillary, and hilar regions; focal hypermetabolism in T12 vertebral body; no evidence of metastatic disease within abdomen or pelvis.  She also underwent echocardiogram on 07/22/2020 showing an ejection fraction of 60-65%.  She also underwent port insertion the same day in anticipation of beginning neoadjuvant chemotherapy   Finally, she also underwent thoracic spine MRI on 07/27/2020 to further evaluate the lesions seen on PET scan. This showed: indeterminate T12 signal abnormality, proximity to disc and peripheral fatty marrow conversion favors Schmorl's node/focal endplate degeneration rather than metastasis.  She received her first cycle of chemotherapy 07/23/2020.  Today is day 8 cycle 1.   REVIEW OF SYSTEMS: Kayla Little did generally well with her first cycle but she did have significant diarrhea.  This is due to the [place].  She had more than 6 loose bowel  movements on day 6 for example.  Once she got Imodium that improved but she still having some loose bowel movements even now.  She has pain in her mouth but no mouth sores as such.  She is using Biotene.  She has a feeling like she does when her calcium is low she says and indeed her calcium was low today.  She has very minimal nausea and she found that Compazine and Zofran both worked as far as that is concerned.  She was able to keep herself hydrated without too much difficulty and she explored with various fruits and other simple foods when she was having some GI issues from the chemotherapy.  She does not have periods recall that she has an IUD in place but she has some cramps the evening of day 1 which felt menstrual to her.  For couple of days she felt slight urethral irritability like she might have a urinary infection.  She never had any temperature.  Port worked well.  Detailed review of systems was otherwise noncontributory   COVID 19 VACCINATION STATUS: Status post Moderna x2 followed by [employer] booster   HISTORY OF CURRENT ILLNESS: From the original intake note:  [name] (the daughter of my patient, Jordin Dambrosio, whom I saw earlier today). has a history of medullary thyroid cancer, status post total thyroidectomy on 04/12/2019 under Dr. Harlow Asa.   More recently she presented to her GYN with a right supraclavicular lump, prompting neck and chest CT on 06/24/2020. Neck CT showed: no recurrent mass in thyroid bed, palpable abnormality corresponds to a cluster of 2 lymph nodes in right supraclavicular region; prominent lym ph nodes in neck bilaterally. Chest CT showed: enhancing 1.4 cm lesion in medial right breast with associated mildly enlarged  right axillary lymph nodes; 1.2 cm lesion in left hepatic lobe with central hyperattenuation; enlargement of right infrahilar and right hilar nodal tissue is borderline suspicious; sclerotic lesion with spiculated margins at T7 level favored to  represent a bone island.  She underwent bilateral diagnostic mammography with tomography and bilateral breast ultrasonography at [facility] on 06/27/2020 showing: breast density category D; Right Breast 1. 2.6 cm irregular mass in inner breast, with associated microcalcifications spanning up to 5.1 cm, corresponding to chest CT finding. 2. Adjacent 0.9 cm mass in inner breast, 2 cm away from dominant mass. 3. Two indeterminate masses in outer breast. 4. At least 3 enlarged lymph nodes in right axilla  Left Breast 1. Indeterminate 1.2 cm mass in lower-outer breast, possibly a fibroadenoma 2. No enlarged lymphadenopathy in left axilla.  Accordingly on 06/30/2020 she proceeded to biopsy of the right breast area in question. The pathology from this procedure ([id number]) showed:  1. Dominant mass  - invasive ductal carcinoma, grade 1  - ductal carcinoma in situ  - Prognostic indicators significant for: estrogen receptor, 90% positive and progesterone receptor, 80% positive, both with moderate staining intensity.  HER2 equivocal by immunohistochemistry (2+), but positive by FISH, with a signals ratio of 2.2 and the number per cell greater than 5.. 2. Smaller mass  - invasive ductal carcinoma, grade 1  - ductal carcinoma in situ  - Prognostic indicators significant for: estrogen receptor, 100% positive with strong staining intensity and progesterone receptor, 50% positive with moderate staining intensity.  HER2 positive by immunohistochemistry (3+). 3. Right axillary lymph node  - invasive ductal carcinoma, grade 1 Prognostic indicators significant for: estrogen receptor, 100% positive and progesterone receptor, 90% positive, both with strong staining intensity. HER2 equivocal by immunohistochemistry (2+), {but (positive or negative) by fluorescent in situ hybridization   Biopsy of the left breast area was performed the same day. Pathology from the procedure ([id number]) was benign.  She underwent  breast MRI on 07/06/2020 showing: breast composition C; enhancing mass with adjacent non-mass enhancement in lower-inner right breast, which together measure 5.4 cm, corresponding with two biopsy-proven areas of malignancy; no other suspicious enhancement identified in right breast; one 4 mm right internal mammary lymph node with unclear significance; numerous abnormal right axillary lymph nodes; no evidence of left breast malignancy.  Cancer Staging Malignant neoplasm of upper-inner quadrant of right breast in female, estrogen receptor positive ([place]) Staging form: Breast, AJCC 8th Edition - Clinical stage from 07/08/2020: Stage IIIA (cT3, cN3, cM0, G1, ER+, PR+, HER2+) - Unsigned Stage prefix: Initial diagnosis Histologic grading system: 3 grade system  The patient's subsequent history is as detailed below.   PAST MEDICAL HISTORY: Past Medical History:  Diagnosis Date  . ADD (attention deficit disorder)   . Breast cancer ([place]) 06/30/2020  . Breast lump    R breast  . Depression   . Family history of breast cancer   . Family history of lymphoma   . Family history of ovarian cancer   . Family history of prostate cancer   . 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 Hx of varicella   . Kidney stone 2012  . Medullary thyroid carcinoma ([place])   . Monoallelic mutation of CHEK2 gene in female patient   . Postpartum care following vaginal delivery (7/27) 10/02/2014, June 16 , 2020  . Thyroid cancer ([place]) 2021    PAST SURGICAL HISTORY: Past Surgical History:  Procedure Laterality Date  . IR IMAGING GUIDED PORT INSERTION  07/22/2020  . NO PAST SURGERIES    . THYROIDECTOMY N/A 04/12/2019   Procedure: TOTAL THYROIDECTOMY WITH LIMITED LYMPH NODE DISSECTION;  Surgeon: Armandina Gemma, MD;  Location: [facility];  Service: General;  Laterality: N/A;  . UNILATERAL SALPINGECTOMY Right 07/09/2017   Procedure: RIGHT SALPINGECTOMY WITH REMOVAL OF ECTOPIC PREGNANCY;  Surgeon: Azucena Fallen, MD;  Location: [facility];  Service: Gynecology;  Laterality: Right;    FAMILY HISTORY: Family History  Problem Relation Age of Onset  . Breast cancer Mother 49       2 types of cancer one side mastectomy with chemo  . Endometriosis Mother   . Endometriosis Maternal Grandmother   . Breast cancer Maternal Grandmother        post menopausal  . Diabetes Maternal Grandmother   . Cancer Maternal Grandfather        prostate and lymphoma  . Alzheimer's disease Paternal Grandmother   . Cancer Father 58       prostate cancer  . Aneurysm Paternal Grandfather        d. 17s   Please see genetic counseling note dated 11/23/2018 for full family information.   GYNECOLOGIC HISTORY:  No LMP recorded. (Menstrual status: IUD). Menarche: 35 years old Age at first live birth: 35 years old Holly P 2 Patient has a Mirena IUD in place HRT n/a  Hysterectomy? no BSO?  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  Her husband Barnabas Lister works for [employer].  Their children are Dylan 5 and Emma 2.  Barnabas Lister has a 52 year old daughter, the patient's stepdaughter Hildred Alamin, currently attending [employer].  The patient is a [employer]    ADVANCED DIRECTIVES: In the absence of any documents to the contrary the patient's husband is her healthcare power of attorney   HEALTH MAINTENANCE: Social History   Tobacco Use  . Smoking status: Current Some Day Smoker    Packs/day: 0.50    Years: 5.00    Pack years: 2.50    Types: Cigarettes  . Smokeless tobacco: Never Used  . Tobacco comment: Actively trying to quit.  Vaping Use  . Vaping Use: Never used  Substance Use Topics  . Alcohol use: Yes    Alcohol/week: 7.0 - 14.0 standard drinks    Types: 7 - 14 Glasses of wine per week    Comment: glass of wine each night   . Drug use: Not Currently    Types: Marijuana    Comment: not during pregnancy, last use was  03/27/2019     Colonoscopy: n/a (age)  PAP:   Bone density: n/a (age)   No Known Allergies  Current Outpatient Medications  Medication Sig Dispense Refill  . acetaminophen (TYLENOL) 325 MG tablet Take 2 tablets (650 mg total) by mouth every 4 (four) hours as needed (for pain scale < 4). 100 tablet 0  . dexamethasone (DECADRON) 4 MG tablet Take 2 tablets (8 mg total) by mouth 2 (two) times daily. Start the day before Taxotere. Then take daily x 3 days after chemotherapy. (Patient not taking: No sig reported) 30 tablet 1  . ibuprofen (ADVIL) 600 MG tablet Take 1 tablet (600 mg total) by mouth every 6 (six) hours. (Patient not taking: No sig reported) 30 tablet 0  . levonorgestrel (MIRENA) 20 MCG/24HR IUD 1 each by Intrauterine route once.    Marland Kitchen levothyroxine (SYNTHROID) 175 MCG tablet 1 tablet in the morning on  an empty stomach    . lidocaine-prilocaine (EMLA) cream Apply to affected area once (Patient not taking: No sig reported) 30 g 3  . loratadine (CLARITIN) 10 MG tablet Take 1 tablet (10 mg total) by mouth daily. (Patient not taking: No sig reported) 90 tablet 0  . ondansetron (ZOFRAN) 8 MG tablet Take 1 tablet (8 mg total) by mouth 2 (two) times daily as needed (Nausea or vomiting). Start on the third day after chemotherapy. (Patient not taking: No sig reported) 30 tablet 1  . prochlorperazine (COMPAZINE) 10 MG tablet Take 1 tablet (10 mg total) by mouth every 6 (six) hours as needed (Nausea or vomiting). (Patient not taking: No sig reported) 30 tablet 1  . sertraline (ZOLOFT) 50 MG tablet Take 50 mg by mouth at bedtime.    Marland Kitchen VYVANSE 40 MG capsule Take 40 mg by mouth every morning.     No current facility-administered medications for this visit.    OBJECTIVE: White woman who appears stated age  60:   07/30/20 1208  BP: 101/72  Pulse: 98  Resp: 18  Temp: (!) 97 F (36.1 C)  SpO2: 100%     Body mass index is 25.81 kg/m.   Wt Readings from Last 3 Encounters:  07/30/20 159 lb  14.4 oz (72.5 kg)  07/22/20 160 lb (72.6 kg)  07/15/20 163 lb 3.2 oz (74 kg)      ECOG FS:1 - Symptomatic but completely ambulatory  Sclerae unicteric, EOMs intact Wearing a mask The right cervical/supraclavicular node appears a bit flatter and smaller. Lungs no rales or rhonchi Heart regular rate and rhythm Abd soft, nontender, positive bowel sounds MSK no focal spinal tenderness, no upper extremity lymphedema Neuro: nonfocal, well oriented, appropriate affect Breasts: I do not palpate a mass in the right breast or right axilla.   LAB RESULTS:  CMP     Component Value Date/Time   NA 138 07/23/2020 0840   NA 142 02/16/2017 1332   K 3.8 07/23/2020 0840   CL 105 07/23/2020 0840   CO2 22 07/23/2020 0840   GLUCOSE 177 (H) 07/23/2020 0840   BUN 8 07/23/2020 0840   BUN 9 02/16/2017 1332   CREATININE 0.74 07/23/2020 0840   CREATININE 0.91 07/08/2020 1400   CREATININE 0.82 12/04/2012 1732   CALCIUM 8.3 (L) 07/23/2020 0840   PROT 6.8 07/23/2020 0840   PROT 6.5 02/16/2017 1332   ALBUMIN 3.8 07/23/2020 0840   ALBUMIN 4.3 02/16/2017 1332   AST 84 (H) 07/23/2020 0840   AST 27 07/08/2020 1400   ALT 66 (H) 07/23/2020 0840   ALT 28 07/08/2020 1400   ALKPHOS 74 07/23/2020 0840   BILITOT 0.2 (L) 07/23/2020 0840   BILITOT 0.2 (L) 07/08/2020 1400   GFRNONAA >60 07/23/2020 0840   GFRNONAA >60 07/08/2020 1400   GFRAA >60 04/15/2019 2145    No results found for: TOTALPROTELP, ALBUMINELP, A1GS, A2GS, BETS, BETA2SER, GAMS, MSPIKE, SPEI  Lab Results  Component Value Date   WBC 10.0 07/30/2020   NEUTROABS PENDING 07/30/2020   HGB 15.4 (H) 07/30/2020   HCT 45.6 07/30/2020   MCV 99.1 07/30/2020   PLT 252 07/30/2020    No results found for: LABCA2  No components found for: FAOZHY865  No results for input(s): INR in the last 168 hours.  No results found for: LABCA2  No results found for: HQI696  No results found for: EXB284  No results found for: XLK440  No results found  for: CA2729  No  components found for: HGQUANT  No results found for: CEA1 / No results found for: CEA1   No results found for: AFPTUMOR  No results found for: CHROMOGRNA  No results found for: KPAFRELGTCHN, LAMBDASER, KAPLAMBRATIO (kappa/lambda light chains)  No results found for: HGBA, HGBA2QUANT, HGBFQUANT, HGBSQUAN (Hemoglobinopathy evaluation)   No results found for: LDH  No results found for: IRON, TIBC, IRONPCTSAT (Iron and TIBC)  No results found for: FERRITIN  Urinalysis    Component Value Date/Time   COLORURINE YELLOW 09/18/2013 [place] 09/18/2013 1555   LABSPEC 1.014 09/18/2013 1555   PHURINE 8.0 09/18/2013 1555   GLUCOSEU NEG 09/18/2013 1555   HGBUR NEG 09/18/2013 1555   BILIRUBINUR small (A) 02/16/2017 1216   BILIRUBINUR small 12/04/2012 1615   KETONESUR trace (5) (A) 02/16/2017 1216   KETONESUR NEG 09/18/2013 1555   PROTEINUR negative 02/16/2017 1216   PROTEINUR NEG 09/18/2013 1555   UROBILINOGEN 0.2 02/16/2017 1216   UROBILINOGEN 0.2 09/18/2013 1555   NITRITE Negative 02/16/2017 1216   NITRITE NEG 09/18/2013 1555   LEUKOCYTESUR Negative 02/16/2017 1216    STUDIES: MR THORACIC SPINE W WO CONTRAST  Result Date: 07/29/2020 CLINICAL DATA:  Breast cancer EXAM: MRI THORACIC WITHOUT AND WITH CONTRAST TECHNIQUE: Multiplanar and multiecho pulse sequences of the thoracic spine were obtained without and with intravenous contrast. CONTRAST:  63m GADAVIST GADOBUTROL 1 MMOL/ML IV SOLN COMPARISON:  Head CT 07/21/2020 FINDINGS: Alignment:  Normal Vertebrae: Approximately 1 cm T1 and T2 hypointensity along the left upper aspect of the T12 vertebral body, adjacent to the disc space. Location adjacent to disc and a peripheral fatty marrow signal with superimposed edematous signal favors a discogenic/degenerative cause. T7 body bone island.  T10 hemangioma. No fracture, discitis, or aggressive bone lesion. Cord:  Negative Paraspinal and other soft tissues:  Negative no additional findings relative to prior PET-CT. Disc levels: T7-8 right paracentral protrusion without neural compression. IMPRESSION: Indeterminate T12 signal abnormality correlating with the prior PET-CT. Proximity to the disc and peripheral fatty marrow conversion favors a Schmorl's node/focal endplate degeneration rather than metastasis, but recommend attention on follow-up. Electronically Signed   By: JMonte FantasiaM.D.   On: 07/29/2020 06:46   MR BREAST BILATERAL W WO CONTRAST INC CAD  Result Date: 07/07/2020 CLINICAL DATA:  35year old female with diagnosis of right breast cancer which was originally found on a chest CT performed 06/24/2020. She has family history of breast cancer in her mother and maternal grandmother. She had 2 ultrasound-guided biopsies of masses in the medial right breast, both demonstrating invasive ductal carcinoma. She also had a right axillary mass biopsied which showed invasive ductal carcinoma with no residual nodal tissue, which could represent an additional primary focus or an entirely replaced metastatic lymph node. A biopsy of the left breast was benign. She presents for staging MRI prior to initiating neoadjuvant chemotherapy. LABS:  None. EXAM: BILATERAL BREAST MRI WITH AND WITHOUT CONTRAST TECHNIQUE: Multiplanar, multisequence MR images of both breasts were obtained prior to and following the intravenous administration of 20 mL of MultiHance Three-dimensional MR images were rendered by post-processing of the original MR data on an independent workstation. The three-dimensional MR images were interpreted, and findings are reported in the following complete MRI report for this study. Three dimensional images were evaluated at the independent interpreting workstation using the DynaCAD thin client. COMPARISON:  No prior MRI available for comparison. Correlation made with prior mammogram and ultrasound images. FINDINGS: Breast composition: c. Heterogeneous  fibroglandular tissue. Background  parenchymal enhancement: Mild. Right breast: In the slightly lower inner quadrant of the right breast, middle depth, there is a round enhancing mass measuring 1.6 cm (series 8, image 65). Susceptibility artifact from the biopsy marking clip is seen along the medial aspect of the mass. Superior and medial to the mass, there is an area of non mass enhancement (series 8, image 62). This corresponds with the asymmetry and calcifications identified mammographically. The mass and non mass enhancement together measure approximately 5.4 x 3.0 x 4.0 cm. Susceptibility artifact is seen within the region of non mass enhancement, corresponding to the second site of biopsy demonstrating malignancy. Left breast: No mass or abnormal enhancement. Susceptibility artifact from the benign biopsy is identified in the lower outer quadrant of the left breast. Lymph nodes: There are numerous asymmetrically enlarged right axillary lymph nodes (at least 8). There is one 4 mm internal mammary lymph node (series 7, image 49). Ancillary findings:  None. IMPRESSION: 1. There is an enhancing mass with adjacent non mass enhancement in the lower-inner right breast, which together measure 5.4 cm. The mass and non mass enhancement correspond with the 2 biopsy-proven areas of malignancy. 2. No other suspicious enhancement is identified in the right breast. 3. There is one 4 mm right internal mammary lymph node with unclear significance as there are no prior MRIs to compare for stability. 4.  Numerous abnormal right axillary lymph nodes. 5.  No evidence of left breast malignancy. RECOMMENDATION: 1. Review of the surgical notes indicates that the patient is currently planning neoadjuvant chemotherapy followed by mastectomy. If this remains the case, no additional biopsies are necessary. However, if the patient decides to pursue breast conservation, then additional biopsies as described in the initial diagnostic report  would be recommended. If those are benign, and breast conservation is desired, than MRI biopsy of distant areas of non mass enhancement in the medial right breast would be recommended to denote extent of disease prior to initiation of neoadjuvant chemotherapy. BI-RADS CATEGORY  6: Known biopsy-proven malignancy. Electronically Signed   By: Ammie Ferrier M.D.   On: 07/07/2020 14:32   NM PET Image Initial (PI) Skull Base To Thigh  Result Date: 07/22/2020 CLINICAL DATA:  Initial treatment strategy for right breast carcinoma. EXAM: NUCLEAR MEDICINE PET SKULL BASE TO THIGH TECHNIQUE: 8.2 mCi F-18 FDG was injected intravenously. Full-ring PET imaging was performed from the skull base to thigh after the radiotracer. CT data was obtained and used for attenuation correction and anatomic localization. Fasting blood glucose: 103 mg/dl COMPARISON:  Neck and chest CT on 06/24/2020 FINDINGS: Mediastinal blood-pool activity (background): SUV max = 2.2 Liver activity (reference): SUV max = N/A NECK: 2 sub-cm right supraclavicular lymph nodes show hypermetabolic activity, with highest SUV max measuring 5.8. Incidental CT findings:  None. CHEST: 1.5 cm hypermetabolic soft tissue mass is seen in the medial right breast, which has SUV max of 8.9 and is consistent with the known primary breast carcinoma. Mild right subpectoral lymphadenopathy is seen, with index lymph node measuring 7 mm on image 57/4 with SUV max of 7.8. Mild left axillary lymphadenopathy is seen with index lymph node measuring 8 mm on image 60/4, with SUV max of 7.3. Sub-cm hypermetabolic lymph nodes are also seen in the right hilum with SUV max of 5.3, the subcarinal region with SUV max of 5.8, and the lateral aortic region with SUV max of 3.7. No suspicious pulmonary nodules seen on CT images. Incidental CT findings:  None. ABDOMEN/PELVIS: No abnormal hypermetabolic  activity within the liver, pancreas, adrenal glands, or spleen. No hypermetabolic lymph nodes  in the abdomen or pelvis. Incidental CT findings:  IUD noted in appropriate position. SKELETON: Focal site of hypermetabolism is seen in the left posterior T12 vertebral body, which has SUV max of 5.1. No corresponding lesion is seen on CT images, however this is consistent with a bone metastasis. Incidental CT findings:  None. IMPRESSION: 1.5 cm hypermetabolic mass in medial right breast, consistent with known primary carcinoma. Mild hypermetabolic lymphadenopathy in right supraclavicular, subpectoral, axillary, and hilar regions, and mediastinum, consistent with metastatic disease. Focal hypermetabolism in the T12 vertebral body, consistent with bone metastasis. No evidence of metastatic disease within the abdomen or pelvis. Electronically Signed   By: Marlaine Hind M.D.   On: 07/22/2020 16:31   ECHOCARDIOGRAM COMPLETE  Result Date: 07/23/2020    ECHOCARDIOGRAM REPORT   Patient Name:   TRINIDEE SCHRAG Date of Exam: 07/22/2020 Medical Rec #:  [id number]    Height:       66.0 in Accession #:    [id number]   Weight:       160.0 lb Date of Birth:  03-04-1986    BSA:          1.819 m Patient Age:    34 years     BP:           108/70 mmHg Patient Gender: F            HR:           92 bpm. Exam Location:  Outpatient Procedure: 2D Echo, 3D Echo, Color Doppler, Limited Color Doppler and Strain            Analysis Indications:   Chemo Z09  History:       Patient has no prior history of Echocardiogram examinations.                Cancer.  Sonographer:   Darlina Sicilian RDCS Referring      [place] Phys: IMPRESSIONS  1. Left ventricular ejection fraction, by estimation, is 60 to 65%. The left ventricle has normal function. The left ventricle has no regional wall motion abnormalities. Left ventricular diastolic parameters were normal. The average left ventricular global longitudinal strain is -20.8 %.  2. Right ventricular systolic function is normal. The right ventricular size is normal. Tricuspid regurgitation  signal is inadequate for assessing PA pressure.  3. The mitral valve is normal in structure. No evidence of mitral valve regurgitation.  4. The aortic valve was not well visualized. Aortic valve regurgitation is not visualized. No aortic stenosis is present.  5. The inferior vena cava is normal in size with greater than 50% respiratory variability, suggesting right atrial pressure of 3 mmHg. FINDINGS  Left Ventricle: Left ventricular ejection fraction, by estimation, is 60 to 65%. The left ventricle has normal function. The left ventricle has no regional wall motion abnormalities. The average left ventricular global longitudinal strain is -20.8 %. The left ventricular internal cavity size was normal in size. There is no left ventricular hypertrophy. Left ventricular diastolic parameters were normal. Right Ventricle: The right ventricular size is normal. No increase in right ventricular wall thickness. Right ventricular systolic function is normal. Tricuspid regurgitation signal is inadequate for assessing PA pressure. Left Atrium: Left atrial size was normal in size. Right Atrium: Right atrial size was normal in size. Pericardium: There is no evidence of pericardial effusion. Mitral Valve: The mitral valve is normal in  structure. No evidence of mitral valve regurgitation. Tricuspid Valve: The tricuspid valve is normal in structure. Tricuspid valve regurgitation is not demonstrated. Aortic Valve: The aortic valve was not well visualized. Aortic valve regurgitation is not visualized. No aortic stenosis is present. Pulmonic Valve: The pulmonic valve was not well visualized. Pulmonic valve regurgitation is not visualized. Aorta: The aortic root and ascending aorta are structurally normal, with no evidence of dilitation. Venous: The inferior vena cava is normal in size with greater than 50% respiratory variability, suggesting right atrial pressure of 3 mmHg. IAS/Shunts: The interatrial septum was not well visualized.   LEFT VENTRICLE PLAX 2D LVIDd:         4.90 cm  Diastology LVIDs:         3.30 cm  LV e' medial:    11.10 cm/s LV PW:         0.70 cm  LV E/e' medial:  6.4 LV IVS:        0.70 cm  LV e' lateral:   12.90 cm/s LVOT diam:     2.00 cm  LV E/e' lateral: 5.5 LV SV:         70 LV SV Index:   38       2D Longitudinal Strain LVOT Area:     3.14 cm 2D Strain GLS Avg:     -20.8 %  RIGHT VENTRICLE RV S prime:     11.60 cm/s TAPSE (M-mode): 1.8 cm LEFT ATRIUM             Index       RIGHT ATRIUM          Index LA diam:        3.10 cm 1.70 cm/m  RA Area:     9.18 cm LA Vol (A2C):   22.8 ml 12.54 ml/m RA Volume:   17.70 ml 9.73 ml/m LA Vol (A4C):   25.7 ml 14.13 ml/m LA Biplane Vol: 25.5 ml 14.02 ml/m  AORTIC VALVE LVOT Vmax:   102.00 cm/s LVOT Vmean:  70.800 cm/s LVOT VTI:    0.222 m  AORTA Ao Root diam: 3.10 cm Ao Asc diam:  2.40 cm MITRAL VALVE MV Area (PHT): 3.99 cm    SHUNTS MV Decel Time: 190 msec    Systemic VTI:  0.22 m MV E velocity: 70.70 cm/s  Systemic Diam: 2.00 cm MV A velocity: 60.00 cm/s MV E/A ratio:  1.18 Oswaldo Milian MD Electronically signed by Oswaldo Milian MD Signature Date/Time: 07/23/2020/9:16:49 AM    Final    IR IMAGING GUIDED PORT INSERTION  Result Date: 07/22/2020 INDICATION: History of metastatic right-sided breast cancer. In need of durable intravenous access for chemotherapy administration. EXAM: IMPLANTED PORT A CATH PLACEMENT WITH ULTRASOUND AND FLUOROSCOPIC GUIDANCE COMPARISON:  PET-CT-07/21/2020 MEDICATIONS: None ANESTHESIA/SEDATION: Moderate (conscious) sedation was employed during this procedure. A total of Versed 2.5 mg and Fentanyl 125 mcg was administered intravenously. Moderate Sedation Time: 32 minutes. The patient's level of consciousness and vital signs were monitored continuously by radiology nursing throughout the procedure under my direct supervision. CONTRAST:  None FLUOROSCOPY TIME:  1 minute, 6 seconds (4 mGy) COMPLICATIONS: None immediate. PROCEDURE: The  procedure, risks, benefits, and alternatives were explained to the patient. Questions regarding the procedure were encouraged and answered. The patient understands and consents to the procedure. Given history of right-sided breast cancer decision was made to place a left internal jugular approach port a catheter. The left neck and chest were prepped with chlorhexidine in a  sterile fashion, and a sterile drape was applied covering the operative field. Maximum barrier sterile technique with sterile gowns and gloves were used for the procedure. A timeout was performed prior to the initiation of the procedure. Local anesthesia was provided with 1% lidocaine with epinephrine. After creating a small venotomy incision, a micropuncture kit was utilized to access the internal jugular vein. Real-time ultrasound guidance was utilized for vascular access including the acquisition of a permanent ultrasound image documenting patency of the accessed vessel. The microwire was utilized to measure appropriate catheter length. A subcutaneous port pocket was then created along the upper chest wall utilizing a combination of sharp and blunt dissection. The pocket was irrigated with sterile saline. A single lumen "ISP" sized power injectable port was chosen for placement. The 8 Fr catheter was tunneled from the port pocket site to the venotomy incision. The port was placed in the pocket. The external catheter was trimmed to appropriate length. At the venotomy, an 8 Fr peel-away sheath was placed over a guidewire under fluoroscopic guidance. The catheter was then placed through the sheath and the sheath was removed. Final catheter positioning was confirmed and documented with a fluoroscopic spot radiograph. The port was accessed with a Huber needle, aspirated and flushed with heparinized saline. The venotomy site was closed with an interrupted 4-0 Vicryl suture. The port pocket incision was closed with interrupted 2-0 Vicryl suture. The  skin was opposed with a running subcuticular 4-0 Vicryl suture. Dermabond and Steri-strips were applied to both incisions. Dressings were applied. The patient tolerated the procedure well without immediate post procedural complication. FINDINGS: After catheter placement, the tip lies within the superior cavoatrial junction. The catheter aspirates and flushes normally and is ready for immediate use. IMPRESSION: Successful placement of a left internal jugular approach power injectable Port-A-Cath. The catheter is ready for immediate use. Electronically Signed   By: Sandi Mariscal M.D.   On: 07/22/2020 11:15     ELIGIBLE FOR AVAILABLE RESEARCH PROTOCOL:   ASSESSMENT: 35 y.o. [place] woman with a clinical mT2 N3, stage IIIA invasive ductal carcinoma, grade 1, triple positive  (a) breast MRI 07/06/2020 finds a 1.6 cm lower inner quadrant mass associated with 5.4 cm of non-mass-like enhancement, at least 8 enlarged right axillary lymph node and 1 right internal mammary lymph node  (b) CT scans of the neck and chest 06/24/2020 shows subcentimeter right cervical adenopathy, 2 right supraclavicular lymph nodes the larger 0.65 cm, also left cervical adenopathy largest measuring 1.02 cm; there is also a 1.2 cm lesion in the left hepatic lobe, and a sclerotic lesion in the mid thoracic spine at T7  (c) PET scan 07/21/2020 shows regional adenopathy (including 2 right subcentimeter supraclavicular lymph nodes and subcentimeter lymph nodes in the right hilar, subcarinal and lateral aortic region), no uptake in liver, and an indeterminate T12 lesion  (d) thoracic spine MRI 07/27/2020 shows the T12 lesion to be likely benign, there is also degenerative disc disease  (1) status post total thyroidectomy and central compartment lymph node biopsy 04/12/2019 for a pT1a pN0 medullary thyroid carcinoma, with negative margins, no angioinvasion, and no extrathyroidal extension  (a) CEA and calcitonin   (2) genetics testing  10/27/2018 showed   (a) 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 21 days x 6, starting 07/23/2020  (4) anti-HER2 immunotherapy to continue to total 1 year  (a) echo 07/22/2020 shows an ejection fraction in the 60-65% range.  (5) definitive surgery to follow  (6) adjuvant radiation  (7) antiestrogens   PLAN: Kayla Little did generally well with her first cycle of chemotherapy but she did have significant diarrhea.  I am going to omit the Perjeta from subsequent cycles.  She will continue to hydrate herself adequately and she will use the Imodium as needed until the diarrhea resolves which may take another couple of weeks.  She has developed's early rosacea and I am starting her on doxycycline.  If that does not take care of it we can add MetroGel.  For the mouth soreness, although she does not actually have sores right now, I am adding Valtrex and she will continue these for the next 2 months.  Her calcium does go low and she does have symptoms when that happens so I am adding calcitriol to her regimen to take twice daily.  She will check with Dr. Cindra Eves office to make sure he does not want to be more aggressive.  Otherwise I am very pleased with her counts today.  She will not need to see is in between treatments unless she has a specific issue and she will not need antibiotic coverage for neutropenia.  She will return with cycle 2 to see one of our advanced practice practitioners and then she will see me again with cycle 3  She knows to call for any other issue that may develop before the next visit  Total encounter time 30 minutes.Sarajane Jews C.  Taliyah Watrous, MD 07/30/2020 12:19 PM Medical Oncology and Hematology [facility] [place], [place] [address] Tel. [phone]    Fax. [phone]   This document serves as a record of services personally performed by Lurline Del, MD. It was created on his behalf by Wilburn Mylar, a trained medical scribe. The creation of this record is based on the scribe's personal observations and the provider's statements to them.   I, Lurline Del MD, have reviewed the above documentation for accuracy and completeness, and I agree with the above.   *Total Encounter Time as defined by the Centers for Medicare and Medicaid Services includes, in addition to the face-to-face time of a patient visit (documented in the note above) non-face-to-face time: obtaining and reviewing outside history, ordering and reviewing medications, tests or procedures, care coordination (communications with other health care professionals or caregivers) and documentation in the medical record.

## 2020-07-29 NOTE — Progress Notes (Signed)
I called Kayla Little with the results of her MRI which are reassuring

## 2020-07-30 ENCOUNTER — Encounter: Payer: Self-pay | Admitting: *Deleted

## 2020-07-30 ENCOUNTER — Inpatient Hospital Stay: Payer: 59

## 2020-07-30 ENCOUNTER — Inpatient Hospital Stay (HOSPITAL_BASED_OUTPATIENT_CLINIC_OR_DEPARTMENT_OTHER): Payer: 59 | Admitting: Oncology

## 2020-07-30 ENCOUNTER — Other Ambulatory Visit: Payer: Self-pay

## 2020-07-30 ENCOUNTER — Inpatient Hospital Stay (HOSPITAL_COMMUNITY)
Admission: EM | Admit: 2020-07-30 | Discharge: 2020-08-01 | DRG: 641 | Disposition: A | Discharge status: Home / Self Care | Payer: 59 | Attending: Family Medicine | Admitting: Family Medicine

## 2020-07-30 ENCOUNTER — Encounter (HOSPITAL_COMMUNITY): Payer: Self-pay | Admitting: Emergency Medicine

## 2020-07-30 VITALS — BP 101/72 | HR 98 | Temp 97.0°F | Resp 18 | Ht 66.0 in | Wt 159.9 lb

## 2020-07-30 DIAGNOSIS — E860 Dehydration: Secondary | ICD-10-CM | POA: Diagnosis not present

## 2020-07-30 DIAGNOSIS — E871 Hypo-osmolality and hyponatremia: Secondary | ICD-10-CM

## 2020-07-30 DIAGNOSIS — E890 Postprocedural hypothyroidism: Secondary | ICD-10-CM

## 2020-07-30 DIAGNOSIS — R197 Diarrhea, unspecified: Secondary | ICD-10-CM

## 2020-07-30 DIAGNOSIS — C50211 Malignant neoplasm of upper-inner quadrant of right female breast: Secondary | ICD-10-CM

## 2020-07-30 DIAGNOSIS — F17210 Nicotine dependence, cigarettes, uncomplicated: Secondary | ICD-10-CM | POA: Diagnosis present

## 2020-07-30 DIAGNOSIS — Z85850 Personal history of malignant neoplasm of thyroid: Secondary | ICD-10-CM

## 2020-07-30 DIAGNOSIS — Z803 Family history of malignant neoplasm of breast: Secondary | ICD-10-CM

## 2020-07-30 DIAGNOSIS — Z79899 Other long term (current) drug therapy: Secondary | ICD-10-CM

## 2020-07-30 DIAGNOSIS — Z170 Estrogen receptor positive status [ER+]: Secondary | ICD-10-CM

## 2020-07-30 DIAGNOSIS — Z8042 Family history of malignant neoplasm of prostate: Secondary | ICD-10-CM

## 2020-07-30 DIAGNOSIS — E039 Hypothyroidism, unspecified: Secondary | ICD-10-CM | POA: Diagnosis present

## 2020-07-30 DIAGNOSIS — E3122 Multiple endocrine neoplasia [MEN] type IIA: Secondary | ICD-10-CM

## 2020-07-30 DIAGNOSIS — R9431 Abnormal electrocardiogram [ECG] [EKG]: Secondary | ICD-10-CM

## 2020-07-30 DIAGNOSIS — K521 Toxic gastroenteritis and colitis: Secondary | ICD-10-CM | POA: Diagnosis present

## 2020-07-30 DIAGNOSIS — R112 Nausea with vomiting, unspecified: Secondary | ICD-10-CM

## 2020-07-30 DIAGNOSIS — Z79890 Hormone replacement therapy (postmenopausal): Secondary | ICD-10-CM

## 2020-07-30 DIAGNOSIS — Z20822 Contact with and (suspected) exposure to covid-19: Secondary | ICD-10-CM | POA: Diagnosis present

## 2020-07-30 DIAGNOSIS — Z87442 Personal history of urinary calculi: Secondary | ICD-10-CM

## 2020-07-30 DIAGNOSIS — Z8041 Family history of malignant neoplasm of ovary: Secondary | ICD-10-CM

## 2020-07-30 DIAGNOSIS — Z820 Family history of epilepsy and other diseases of the nervous system: Secondary | ICD-10-CM

## 2020-07-30 DIAGNOSIS — Z807 Family history of other malignant neoplasms of lymphoid, hematopoietic and related tissues: Secondary | ICD-10-CM

## 2020-07-30 DIAGNOSIS — Z148 Genetic carrier of other disease: Secondary | ICD-10-CM

## 2020-07-30 DIAGNOSIS — T451X5A Adverse effect of antineoplastic and immunosuppressive drugs, initial encounter: Secondary | ICD-10-CM

## 2020-07-30 DIAGNOSIS — L719 Rosacea, unspecified: Secondary | ICD-10-CM | POA: Diagnosis present

## 2020-07-30 DIAGNOSIS — Z833 Family history of diabetes mellitus: Secondary | ICD-10-CM

## 2020-07-30 DIAGNOSIS — E876 Hypokalemia: Secondary | ICD-10-CM

## 2020-07-30 LAB — COMPREHENSIVE METABOLIC PANEL
ALT: 139 U/L — ABNORMAL HIGH (ref 0–44)
ALT: 110 U/L — ABNORMAL HIGH (ref 0–44)
AST: 41 U/L (ref 15–41)
AST: 36 U/L (ref 15–41)
Albumin: 4.1 g/dL (ref 3.5–5.0)
Albumin: 4.1 g/dL (ref 3.5–5.0)
Alkaline Phosphatase: 65 U/L (ref 38–126)
Alkaline Phosphatase: 63 U/L (ref 38–126)
Anion gap: 13 (ref 5–15)
Anion gap: 11 (ref 5–15)
BUN: 6 mg/dL (ref 6–20)
BUN: 8 mg/dL (ref 6–20)
CO2: 25 mmol/L (ref 22–32)
CO2: 23 mmol/L (ref 22–32)
Calcium: 7.2 mg/dL — ABNORMAL LOW (ref 8.9–10.3)
Calcium: 6.4 mg/dL — CL (ref 8.9–10.3)
Chloride: 98 mmol/L (ref 98–111)
Chloride: 97 mmol/L — ABNORMAL LOW (ref 98–111)
Creatinine, Ser: 0.93 mg/dL (ref 0.44–1.00)
Creatinine, Ser: 0.91 mg/dL (ref 0.44–1.00)
GFR, Estimated: >60 mL/min (ref >60)
GFR, Estimated: >60 mL/min (ref >60)
Glucose, Bld: 92 mg/dL (ref 70–99)
Glucose, Bld: 101 mg/dL — ABNORMAL HIGH (ref 70–99)
Potassium: 3.7 mmol/L (ref 3.5–5.1)
Potassium: 3.1 mmol/L — ABNORMAL LOW (ref 3.5–5.1)
Sodium: 136 mmol/L (ref 135–145)
Sodium: 131 mmol/L — ABNORMAL LOW (ref 135–145)
Total Bilirubin: 0.4 mg/dL (ref 0.3–1.2)
Total Bilirubin: 0.3 mg/dL (ref 0.3–1.2)
Total Protein: 7.5 g/dL (ref 6.5–8.1)
Total Protein: 7.1 g/dL (ref 6.5–8.1)

## 2020-07-30 LAB — RESP PANEL BY RT-PCR (FLU A&B, COVID) ARPGX2
Influenza A by PCR: NEGATIVE
Influenza B by PCR: NEGATIVE
SARS Coronavirus 2 by RT PCR: NEGATIVE

## 2020-07-30 LAB — CBC WITH DIFFERENTIAL/PLATELET
Abs Immature Granulocytes: 0.22 10*3/uL — ABNORMAL HIGH (ref 0.00–0.07)
Abs Immature Granulocytes: 0.85 10*3/uL — ABNORMAL HIGH (ref 0.00–0.07)
Basophils Absolute: 0.1 10*3/uL (ref 0.0–0.1)
Basophils Absolute: 0 10*3/uL (ref 0.0–0.1)
Basophils Relative: 1 %
Basophils Relative: 0 %
Eosinophils Absolute: 0 10*3/uL (ref 0.0–0.5)
Eosinophils Absolute: 0 10*3/uL (ref 0.0–0.5)
Eosinophils Relative: 0 %
Eosinophils Relative: 0 %
HCT: 45.6 % (ref 36.0–46.0)
HCT: 42.2 % (ref 36.0–46.0)
Hemoglobin: 15.4 g/dL — ABNORMAL HIGH (ref 12.0–15.0)
Hemoglobin: 14.6 g/dL (ref 12.0–15.0)
Immature Granulocytes: 2 %
Immature Granulocytes: 5 %
Lymphocytes Relative: 27 %
Lymphocytes Relative: 22 %
Lymphs Abs: 2.6 10*3/uL (ref 0.7–4.0)
Lymphs Abs: 3.6 10*3/uL (ref 0.7–4.0)
MCH: 33.5 pg (ref 26.0–34.0)
MCH: 34 pg (ref 26.0–34.0)
MCHC: 33.8 g/dL (ref 30.0–36.0)
MCHC: 34.6 g/dL (ref 30.0–36.0)
MCV: 99.1 fL (ref 80.0–100.0)
MCV: 98.1 fL (ref 80.0–100.0)
Monocytes Absolute: 1.7 10*3/uL — ABNORMAL HIGH (ref 0.1–1.0)
Monocytes Absolute: 2.2 10*3/uL — ABNORMAL HIGH (ref 0.1–1.0)
Monocytes Relative: 17 %
Monocytes Relative: 13 %
Neutro Abs: 5.3 10*3/uL (ref 1.7–7.7)
Neutro Abs: 9.6 10*3/uL — ABNORMAL HIGH (ref 1.7–7.7)
Neutrophils Relative %: 53 %
Neutrophils Relative %: 60 %
Platelets: 252 10*3/uL (ref 150–400)
Platelets: 257 10*3/uL (ref 150–400)
RBC: 4.6 MIL/uL (ref 3.87–5.11)
RBC: 4.3 MIL/uL (ref 3.87–5.11)
RDW: 12.3 % (ref 11.5–15.5)
RDW: 12.5 % (ref 11.5–15.5)
WBC: 10 10*3/uL (ref 4.0–10.5)
WBC: 16.2 10*3/uL — ABNORMAL HIGH (ref 4.0–10.5)
nRBC: 0 % (ref 0.0–0.2)
nRBC: 0 % (ref 0.0–0.2)

## 2020-07-30 LAB — CEA (IN HOUSE-CHCC): CEA (CHCC-In House): 5.17 ng/mL — ABNORMAL HIGH (ref 0.00–5.00)

## 2020-07-30 LAB — PHOSPHORUS: Phosphorus: 2.8 mg/dL (ref 2.5–4.6)

## 2020-07-30 LAB — MAGNESIUM: Magnesium: 1.8 mg/dL (ref 1.7–2.4)

## 2020-07-30 MED ORDER — POTASSIUM CHLORIDE 10 MEQ/100ML IV SOLN
10.0000 meq | INTRAVENOUS | Status: AC
  Administered 2020-07-31 (×3): 10 meq via INTRAVENOUS
  Filled 2020-07-30 (×2): qty 100

## 2020-07-30 MED ORDER — MAGNESIUM SULFATE IN D5W 1-5 GM/100ML-% IV SOLN
1.0000 g | Freq: Once | INTRAVENOUS | Status: AC
  Administered 2020-07-30: 1 g via INTRAVENOUS
  Filled 2020-07-30: qty 100

## 2020-07-30 MED ORDER — SODIUM CHLORIDE 0.9 % IV BOLUS
1000.0000 mL | Freq: Once | INTRAVENOUS | Status: AC
  Administered 2020-07-30: 1000 mL via INTRAVENOUS

## 2020-07-30 MED ORDER — VALACYCLOVIR HCL 500 MG PO TABS: 500.0000 mg | ORAL_TABLET | Freq: Every day | ORAL | 0 refills | Status: DC

## 2020-07-30 MED ORDER — CALCITRIOL 0.5 MCG PO CAPS
0.5000 ug | ORAL_CAPSULE | ORAL | 1 refills | Status: DC
Start: 2020-07-31 — End: 2023-08-11

## 2020-07-30 MED ORDER — ENOXAPARIN SODIUM 40 MG/0.4ML IJ SOSY
40.0000 mg | PREFILLED_SYRINGE | INTRAMUSCULAR | Status: DC
  Administered 2020-07-31 – 2020-08-01 (×2): 40 mg via SUBCUTANEOUS
  Filled 2020-07-30 (×2): qty 0.4

## 2020-07-30 MED ORDER — ONDANSETRON HCL 4 MG/2ML IJ SOLN
4.0000 mg | Freq: Once | INTRAMUSCULAR | Status: AC
  Administered 2020-07-30: 4 mg via INTRAVENOUS
  Filled 2020-07-30: qty 2

## 2020-07-30 MED ORDER — ACETAMINOPHEN 500 MG PO TABS
1000.0000 mg | ORAL_TABLET | Freq: Once | ORAL | Status: AC
  Administered 2020-07-30: 1000 mg via ORAL
  Filled 2020-07-30: qty 2

## 2020-07-30 MED ORDER — LEVOTHYROXINE SODIUM 50 MCG PO TABS
175.0000 ug | ORAL_TABLET | Freq: Every day | ORAL | Status: DC
  Administered 2020-07-31 – 2020-08-01 (×2): 175 ug via ORAL
  Filled 2020-07-30 (×2): qty 1

## 2020-07-30 MED ORDER — ACETAMINOPHEN 325 MG PO TABS
650.0000 mg | ORAL_TABLET | Freq: Four times a day (QID) | ORAL | Status: DC | PRN
  Administered 2020-07-31 (×2): 650 mg via ORAL
  Filled 2020-07-30 (×2): qty 2

## 2020-07-30 MED ORDER — HYDROCODONE-ACETAMINOPHEN 5-325 MG PO TABS
1.0000 | ORAL_TABLET | ORAL | Status: DC | PRN
  Administered 2020-07-31: 2 via ORAL
  Filled 2020-07-30: qty 2

## 2020-07-30 MED ORDER — METOCLOPRAMIDE HCL 5 MG/ML IJ SOLN
5.0000 mg | Freq: Four times a day (QID) | INTRAMUSCULAR | Status: DC | PRN
  Administered 2020-07-31: 5 mg via INTRAVENOUS
  Filled 2020-07-30: qty 2

## 2020-07-30 MED ORDER — CALCIUM GLUCONATE-NACL 1-0.675 GM/50ML-% IV SOLN
1.0000 g | Freq: Once | INTRAVENOUS | Status: AC
  Administered 2020-07-30: 1000 mg via INTRAVENOUS
  Filled 2020-07-30: qty 50

## 2020-07-30 MED ORDER — DOXYCYCLINE HYCLATE 100 MG PO TABS: 100.0000 mg | ORAL_TABLET | Freq: Every day | ORAL | 0 refills | Status: DC

## 2020-07-30 MED ORDER — SODIUM CHLORIDE 0.9 % IV SOLN
75.0000 mL/h | INTRAVENOUS | Status: AC
  Administered 2020-07-30: 75 mL/h via INTRAVENOUS

## 2020-07-30 MED ORDER — ACETAMINOPHEN 650 MG RE SUPP: 650.0000 mg | Freq: Four times a day (QID) | RECTAL | Status: DC | PRN

## 2020-07-30 NOTE — H&P (Signed)
CJ BEECHER LJQ:[id number] DOB: 1985-11-30 DOA: 07/30/2020     PCP: [facility], [facility] @ [facility]   Outpatient Specialists:     Oncology   Dr.Magrinat, Sarajane Jews     Patient arrived to ER on 07/30/20 at 1940 Referred by Attending Valarie Merino, MD   Patient coming from: home Lives  With family    Chief Complaint:   Chief Complaint  Patient presents with  . Abnormal Lab  . Emesis  . Diarrhea  . Rash  . Generalized Body Aches  . Numbness    HPI: Kayla Little is a 35 y.o. female with medical history significant of  locally advanced breast cancer now on chemotherapy status post port placement, history of medullary thyroid carcinoma, hypocalcemia    Presented with   Low calcium today down to 7.3 in the office Numbness feeling shaky  new rash on her face oncology felt that it was rosacea and prescribed doxycycline Body aches, N/V She was prescribed doxycycline and calcium but vomitted it back up and came to ER For cycle of chemotherapy was on 18 May she have had some diarrhea with it.  And try to use Imodium with some improvement She did have mild dysuria as well but no fever.  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 --      Excl. in [place]? --   TMAX(24)@     _________________________________________ Significant initial  Findings: Abnormal Labs Reviewed  COMPREHENSIVE METABOLIC PANEL - Abnormal; Notable for the following components:      Result Value   Sodium 131 (*)    Potassium 3.1 (*)    Chloride 97 (*)    Glucose, Bld 101 (*)    Calcium 6.4 (*)    ALT 110 (*)    All other components within normal limits  CBC WITH DIFFERENTIAL/PLATELET - Abnormal; Notable for the following components:   WBC 16.2 (*)    Neutro Abs 9.6 (*)    Monocytes Absolute 2.2 (*)    Abs Immature Granulocytes 0.85 (*)    All other components within normal limits   ____________________________________________ Ordered  _________________________   ECG: Ordered Personally reviewed by me showing: HR :84 Rhythm:  NSR,  nonspecific changes  QTC 536 The recent clinical data is shown below. Vitals:   07/30/20 2115 07/30/20 2130 07/30/20 2145 07/30/20 2200  BP: (!) 56/41 123/82 125/86 120/89  Pulse: 87 86 86 85  Resp: 13 (!) 25 15 17   Temp:      TempSrc:      SpO2: 99% 100% 100% 100%  Weight:      Height:          WBC     Component Value Date/Time   WBC 16.2 (H) 07/30/2020 2040   LYMPHSABS 3.6 07/30/2020 2040   LYMPHSABS 2.2 02/16/2017 1332   MONOABS 2.2 (H) 07/30/2020 2040   EOSABS 0.0 07/30/2020 2040   EOSABS 0.1 02/16/2017 1332   BASOSABS 0.0 07/30/2020 2040   BASOSABS 0.0 02/16/2017 1332     Urine analysis:    Component Value Date/Time   COLORURINE YELLOW 09/18/2013 1555   APPEARANCEUR CLEAR 09/18/2013 1555   LABSPEC 1.014 09/18/2013 1555   PHURINE 8.0 09/18/2013 1555   GLUCOSEU NEG 09/18/2013 1555   HGBUR NEG 09/18/2013 1555   BILIRUBINUR small (A) 02/16/2017 1216   BILIRUBINUR small  12/04/2012 1615   KETONESUR trace (5) (A) 02/16/2017 1216   KETONESUR NEG 09/18/2013 1555   PROTEINUR negative 02/16/2017 1216   PROTEINUR NEG 09/18/2013 1555   UROBILINOGEN 0.2 02/16/2017 1216   UROBILINOGEN 0.2 09/18/2013 1555   NITRITE Negative 02/16/2017 1216   NITRITE NEG 09/18/2013 1555   LEUKOCYTESUR Negative 02/16/2017 1216    Results for orders placed or performed during the hospital encounter of 07/15/20  SARS CORONAVIRUS 2 (TAT 6-24 HRS) Nasopharyngeal Nasopharyngeal Swab     Status: None   Collection Time: 07/15/20  2:17 PM   Specimen: Nasopharyngeal Swab  Result Value Ref Range Status   SARS Coronavirus 2 NEGATIVE NEGATIVE Final    Comment: (NOTE) SARS-CoV-2 target nucleic acids are NOT DETECTED.  The SARS-CoV-2 RNA is generally detectable in upper and lower respiratory specimens during the acute phase of infection. Negative results do not preclude SARS-CoV-2 infection, do not rule out co-infections with other pathogens, and should not be used as the sole basis for treatment or other patient management decisions. Negative results must be combined with clinical observations, patient history, and epidemiological information. The expected result is Negative.  Fact Sheet for Patients: SugarRoll.be  Fact Sheet for Healthcare Providers: https://www.woods-mathews.com/  This test is not yet approved or cleared by the [place] FDA and  has been authorized for detection and/or diagnosis of SARS-CoV-2 by FDA under an Emergency Use Authorization (EUA). This EUA will remain  in effect (meaning this test can be used) for the duration of the COVID-19 declaration under Se ction 564(b)(1) of the Act, 21 U.S.C. section 360bbb-3(b)(1), unless the authorization is terminated or revoked sooner.  Performed at [facility], [place] [address], [place], [place] [address]        _______________________________________________ Hospitalist was called for admission for hypocalcemia  The following Work up has been ordered so far:  Orders Placed This Encounter  Procedures  . Resp Panel by RT-PCR (Flu A&B, Covid) Nasopharyngeal Swab  . Comprehensive metabolic panel  . CBC with Differential  . Magnesium  . Phosphorus  . Calcium, ionized  . Cardiac monitoring  . Consult to hospitalist  Nausea, vomiting, hypocalcemia  . ED EKG  . Repeat EKG  . Saline lock IV     Following Medications were ordered in ER: Medications  sodium chloride 0.9 % bolus 1,000 mL (1,000  mLs Intravenous New Bag/Given 07/30/20 2038)  ondansetron [facility]) injection 4 mg (4 mg Intravenous Given 07/30/20 2030)  acetaminophen (TYLENOL) tablet 1,000 mg (1,000 mg Oral Given 07/30/20 2017)  calcium gluconate 1 g/ 50 mL sodium chloride IVPB (0 g Intravenous Stopped 07/30/20 2138)        Consult Orders  (From admission, onward)         Start     Ordered   07/30/20 2217  Consult to hospitalist  Nausea, vomiting, hypocalcemia  Once       Comments: Nausea, vomiting, hypocalcemia  Provider:  (Not yet assigned)  Question Answer Comment  Place call to: [facility]   Reason for Consult Admit      07/30/20 2217          OTHER Significant initial  Findings:  labs showing:    Recent Labs  Lab 07/30/20 1157 07/30/20 2040  NA 136 131*  K 3.7 3.1*  CO2 25 23  GLUCOSE 92 101*  BUN 6 8  CREATININE 0.93 0.91  CALCIUM 7.2* 6.4*  MG  --  1.8  PHOS  --  2.8    Cr stable,   Lab Results  Component Value Date   CREATININE 0.91 07/30/2020   CREATININE 0.93 07/30/2020   CREATININE 0.74 07/23/2020    Recent Labs  Lab 07/30/20 1157 07/30/20 2040  AST 41 36  ALT 139* 110*  ALKPHOS 65 63  BILITOT 0.4 0.3  PROT 7.5 7.1  ALBUMIN 4.1 4.1   Lab Results  Component Value Date   CALCIUM 6.4 (LL) 07/30/2020   PHOS 2.8 07/30/2020      Plt: Lab Results  Component Value Date    PLT 257 07/30/2020      COVID-19 Labs  No results for input(s): DDIMER, FERRITIN, LDH, CRP in the last 72 hours.  Lab Results  Component Value Date   SARSCOV2NAA NEGATIVE 07/15/2020   SARSCOV2NAA NEGATIVE 04/09/2019   SARSCOV2NAA NOT DETECTED 08/18/2018       Recent Labs  Lab 07/30/20 1157 07/30/20 2040  WBC 10.0 16.2*  NEUTROABS 5.3 9.6*  HGB 15.4* 14.6  HCT 45.6 42.2  MCV 99.1 98.1  PLT 252 257    HG/HCT  Stable,     Component Value Date/Time   HGB 14.6 07/30/2020 2040   HGB 13.6 07/08/2020 1400   HGB 12.2 02/16/2017 1332   HCT 42.2 07/30/2020 2040   HCT 37.0 02/16/2017 1332   MCV 98.1 07/30/2020 2040   MCV 98 (H) 02/16/2017 1332            Cultures:    Component Value Date/Time   SDES URINE, CLEAN CATCH 11/22/2010 1524   SPECREQUEST NONE 11/22/2010 1524   CULT NO GROWTH 11/22/2010 1524   REPTSTATUS 11/23/2010 FINAL 11/22/2010 1524     Radiological Exams on Admission: No results found. _______________________________________________________________________________________________________ Latest  Blood pressure 120/89, pulse 85, temperature 98.2 F (36.8 C), temperature source Oral, resp. rate 17, height 5' 6"  (1.676 m), weight 72.1 kg, SpO2 100 %, not currently breastfeeding.   Review of Systems:    Pertinent positives include:   fatigue, abdominal pain, nausea, vomiting, diarrhea,   Constitutional:  No weight loss, night sweats, Fevers, chills,weight loss  HEENT:  No headaches, Difficulty swallowing,Tooth/dental problems,Sore throat,  No sneezing, itching, ear ache, nasal congestion, post nasal drip,  Cardio-vascular:  No chest pain, Orthopnea, PND, anasarca, dizziness, palpitations.no Bilateral lower extremity swelling  GI:  No heartburn, indigestion, change in bowel habits, loss of  appetite, melena, blood in stool, hematemesis Resp:  no shortness of breath at rest. No dyspnea on exertion, No excess mucus, no productive cough, No non-productive  cough, No coughing up of blood.No change in color of mucus.No wheezing. Skin:  no rash or lesions. No jaundice GU:  no dysuria, change in color of urine, no urgency or frequency. No straining to urinate.  No flank pain.  Musculoskeletal:  No joint pain or no joint swelling. No decreased range of motion. No back pain.  Psych:  No change in mood or affect. No depression or anxiety. No memory loss.  Neuro: no localizing neurological complaints, no tingling, no weakness, no double vision, no gait abnormality, no slurred speech, no confusion  All systems reviewed and apart from [place] all are negative _______________________________________________________________________________________________ Past Medical History:   Past Medical History:  Diagnosis Date  . ADD (attention deficit disorder)   . Breast cancer ([place]) 06/30/2020  . Breast lump    R breast  . Depression   . Family history of breast cancer   . Family history of lymphoma   . Family history of ovarian cancer   . Family history of prostate cancer   . 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 Hx of varicella   . Kidney stone 2012  . Medullary thyroid carcinoma ([place])   . Monoallelic mutation of CHEK2 gene in female patient   . Postpartum care following vaginal delivery (7/27) 10/02/2014, June 16 , 2020  . Thyroid cancer ([place]) 2021      Past Surgical History:  Procedure Laterality Date  . IR IMAGING GUIDED PORT INSERTION  07/22/2020  . NO PAST SURGERIES    . THYROIDECTOMY N/A 04/12/2019   Procedure: TOTAL THYROIDECTOMY WITH LIMITED LYMPH NODE DISSECTION;  Surgeon: Armandina Gemma, MD;  Location: [facility];  Service: General;  Laterality: N/A;  . UNILATERAL SALPINGECTOMY Right 07/09/2017   Procedure: RIGHT SALPINGECTOMY WITH REMOVAL OF ECTOPIC PREGNANCY;  Surgeon: Azucena Fallen, MD;  Location: [facility];  Service: Gynecology;  Laterality: Right;    Social History:  Ambulatory   independently       reports  that she has been smoking cigarettes. She has a 2.50 pack-year smoking history. She has never used smokeless tobacco. She reports current alcohol use of about 7.0 - 14.0 standard drinks of alcohol per week. She reports previous drug use. Drug: Marijuana.     Family History:   Family History  Problem Relation Age of Onset  . Breast cancer Mother 73       2 types of cancer one side mastectomy with chemo  . Endometriosis Mother   . Endometriosis Maternal Grandmother   . Breast cancer Maternal Grandmother        post menopausal  . Diabetes Maternal Grandmother   . Cancer Maternal Grandfather        prostate and lymphoma  . Alzheimer's disease Paternal Grandmother   . Cancer Father 77       prostate cancer  . Aneurysm Paternal Grandfather        d. 93s   ______________________________________________________________________________________________ Allergies: No Known Allergies   Prior to Admission medications   Medication Sig Start Date End Date Taking? Authorizing Provider  acetaminophen (TYLENOL) 325 MG tablet Take 2 tablets (650 mg total) by mouth every 4 (four) hours as needed (for pain scale < 4). 08/23/18   Sigmon, Tyler Deis, CNM  calcitRIOL (ROCALTROL) 0.5 MCG capsule Take 1 capsule (0.5 mcg total) by mouth  2 (two) times a week. 07/31/20   Magrinat, Virgie Dad, MD  dexamethasone (DECADRON) 4 MG tablet Take 2 tablets (8 mg total) by mouth 2 (two) times daily. Start the day before Taxotere. Then take daily x 3 days after chemotherapy. Patient not taking: No sig reported 07/08/20   Magrinat, Virgie Dad, MD  doxycycline (VIBRA-TABS) 100 MG tablet Take 1 tablet (100 mg total) by mouth daily. 07/30/20   Magrinat, Virgie Dad, MD  ibuprofen (ADVIL) 600 MG tablet Take 1 tablet (600 mg total) by mouth every 6 (six) hours. Patient not taking: No sig reported 08/23/18   Darliss Cheney, CNM  levonorgestrel (MIRENA) 20 MCG/24HR IUD 1 each by Intrauterine route once.    [provider]   levothyroxine (SYNTHROID) 175 MCG tablet 1 tablet in the morning on an empty stomach    [provider]  lidocaine-prilocaine (EMLA) cream Apply to affected area once Patient not taking: No sig reported 07/08/20   Magrinat, Virgie Dad, MD  loratadine (CLARITIN) 10 MG tablet Take 1 tablet (10 mg total) by mouth daily. Patient not taking: No sig reported 07/08/20   Magrinat, Virgie Dad, MD  ondansetron (ZOFRAN) 8 MG tablet Take 1 tablet (8 mg total) by mouth 2 (two) times daily as needed (Nausea or vomiting). Start on the third day after chemotherapy. Patient not taking: No sig reported 07/08/20   Magrinat, Virgie Dad, MD  prochlorperazine (COMPAZINE) 10 MG tablet Take 1 tablet (10 mg total) by mouth every 6 (six) hours as needed (Nausea or vomiting). Patient not taking: No sig reported 07/08/20   Magrinat, Virgie Dad, MD  sertraline (ZOLOFT) 50 MG tablet Take 50 mg by mouth at bedtime. 01/26/17   [provider]  valACYclovir (VALTREX) 500 MG tablet Take 1 tablet (500 mg total) by mouth daily. 07/30/20   Magrinat, Virgie Dad, MD  VYVANSE 40 MG capsule Take 40 mg by mouth every morning. 06/29/20   [provider]    ___________________________________________________________________________________________________ Physical Exam: Vitals with BMI 07/30/2020 07/30/2020 07/30/2020  Height - - -  Weight - - -  BMI - - -  Systolic [phone]  Diastolic 89 86 82  Pulse 85 86 86     1. General:  in No Acute distress   Chronically ill  -appearing 2. Psychological: Alert and   Oriented 3. Head/ENT:     Dry Mucous Membranes                          Head Non traumatic, neck supple                          Normal  Dentition 4. SKIN:  decreased Skin turgor,  Skin clean Dry and intact no rash 5. Heart: Regular rate and rhythm no  Murmur, no Rub or gallop 6. Lungs  no wheezes or crackles   7. Abdomen: Soft,  non-tender, Non distended  bowel sounds present 8. Lower extremities: no clubbing,  cyanosis, no  edema 9. Neurologically Grossly intact, moving all 4 extremities equally   10. MSK: Normal range of motion  right supraclavicular lymphadenopathy  Chart has been reviewed  ______________________________________________________________________________________________  Assessment/Plan   35 y.o. female with medical history significant of  locally advanced breast cancer now on chemotherapy status post port placement, history of medullary thyroid carcinoma, hypocalcemia Admitted for multiple electrolyte abnormalities and dehydration  Present on Admission: . Hypocalcemia -patient with  chronic recurrent hypocalcemia we will replete and continue to monitor.  Monitor on telemetry given prolonged QTC  . Hyponatremia  -most likely secondary to dehydration will obtain electrolytes gently rehydrate and follow sodium  . Hypokalemia -- will replace and repeat in AM,  check magnesium level and replace as needed   . Prolonged QT interval - - will monitor on tele avoid QT prolonging medications, rehydrate correct electrolytes  . Hypothyroidism - - Check TSH continue home medications at current dose  Hx of Breast Ca -notify primary oncology patient is being admitted  Diarrhea -as per oncology most likely secondary to therapy.  Supportive management obtain gastric panel for completion  Facial rash -patient states this is most similar to acne.  Would hold off on doxycycline  for now given worsening nausea vomiting  Other plan as per orders.  DVT prophylaxis:    Lovenox       Code Status:    Code Status: Prior FULL CODE   as per patient  I had personally discussed CODE STATUS with patient      Family Communication:   Family not at  Bedside    Disposition Plan:         To home once workup is complete and patient is stable   Following barriers for discharge:                            Electrolytes corrected                                                               white count  improving                              Will need to be able to tolerate PO                                                                   Consults called:   emailed oncology pt been admitted   Admission status:  ED Disposition    ED Disposition Condition [place]: [place] [100102]  Level of Care: Telemetry [5]  Admit to tele based on following criteria: Other see comments  Comments: hypocalcemia  Covid Evaluation: Asymptomatic Screening Protocol (No Symptoms)  Diagnosis: Hypocalcemia [275.41.ICD-9-CM]  Admitting Physician: Toy Baker [[id number]]  Attending Physician: Toy Baker [[id number]]       Obs    Level of care      tele  For   24H       Lab Results  Component Value Date   [place] 07/30/2020     Precautions: admitted as  Covid Negative  PPE: Used by the provider:   N95  eye Goggles,  Gloves        Jaycelyn Orrison 07/31/2020, 12:53 AM    [facility]     after 2 AM please page floor coverage PA If 7AM-7PM, please contact the day  team taking care of the patient using [email or web address]   Patient was evaluated in the context of the global COVID-19 pandemic, which necessitated consideration that the patient might be at risk for infection with the SARS-CoV-2 virus that causes COVID-19. Institutional protocols and algorithms that pertain to the evaluation of patients at risk for COVID-19 are in a state of rapid change based on information released by regulatory bodies including the CDC and federal and state organizations. These policies and algorithms were followed during the patient's care.

## 2020-07-30 NOTE — ED Provider Notes (Signed)
Kayla Little   CSN: [id number] Arrival date & time: 07/30/20  1940     History Chief Complaint  Patient presents with  . Abnormal Lab  . Emesis  . Diarrhea  . Rash  . Generalized Body Aches  . Numbness    Kayla Little is a 35 y.o. female.  35 year old female with prior medical history as detailed below presents for evaluation.  Patient reports onset of nausea, vomiting, diarrhea earlier today.  Patient is also complaining of paresthesias, cramps in her upper extremities, and facial cramps.  Patient reports prior history of low calcium.  Reported paresthesias, numbness, muscular cramping are consistent with low calcium.  Patient reports that she was seen by Dr. Jana Hakim earlier today with a calcium of 7.3.  She reports that she was prescribed doxycycline and valtrex to treat a rash that has developed over her face.  After taking the doxycycline she developed significant nausea and vomiting.  Her diarrhea that she had been suffering after chemo became worse.  The history is provided by the patient and medical records.  Illness Location:  Nausea, vomiting, diarrhea, numbness, paresthesias Severity:  Moderate Onset quality:  Sudden Duration:  6 hours Timing:  Constant Progression:  Worsening Chronicity:  New      Past Medical History:  Diagnosis Date  . ADD (attention deficit disorder)   . Breast cancer ([place]) 06/30/2020  . Breast lump    R breast  . Depression   . Family history of breast cancer   . Family history of lymphoma   . Family history of ovarian cancer   . Family history of prostate cancer   . 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 Hx of varicella   . Kidney stone 2012  . Medullary thyroid carcinoma ([place])   . Monoallelic mutation of CHEK2 gene in female patient   . Postpartum care following vaginal delivery (7/27) 10/02/2014, June 16 , 2020  . Thyroid cancer [facility]) 2021    Patient  Active Problem List   Diagnosis Date Noted  . Bone lesion 07/23/2020  . MEN 2A (multiple endocrine neoplasia, type 2A) ([place]) 04/12/2019  . Multiple thyroid nodules 04/08/2019  . Family history of breast cancer   . Family history of prostate cancer   . Family history of lymphoma   . Family history of ovarian cancer   . Monoallelic mutation of CHEK2 gene in female patient   . 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 SVD (spontaneous vaginal delivery) 08/23/2018  . Postpartum care following vaginal delivery (6/16) 08/23/2018  . Second degree perineal laceration 08/23/2018  . Malignant neoplasm of upper-inner quadrant of right breast in female, estrogen receptor positive ([place]) 08/22/2018  . Type O blood, Rh negative 10/08/2014    Past Surgical History:  Procedure Laterality Date  . IR IMAGING GUIDED PORT INSERTION  07/22/2020  . NO PAST SURGERIES    . THYROIDECTOMY N/A 04/12/2019   Procedure: TOTAL THYROIDECTOMY WITH LIMITED LYMPH NODE DISSECTION;  Surgeon: Armandina Gemma, MD;  Location: [facility];  Service: General;  Laterality: N/A;  . UNILATERAL SALPINGECTOMY Right 07/09/2017   Procedure: RIGHT SALPINGECTOMY WITH REMOVAL OF ECTOPIC PREGNANCY;  Surgeon: Azucena Fallen, MD;  Location: [place] [facility];  Service: Gynecology;  Laterality: Right;     OB History    Gravida  3   Para  2   Term  2   Preterm  AB  1   Living  2     SAB      IAB      Ectopic  1   Multiple  0   Live Births  2           Family History  Problem Relation Age of Onset  . Breast cancer Mother 44       2 types of cancer one side mastectomy with chemo  . Endometriosis Mother   . Endometriosis Maternal Grandmother   . Breast cancer Maternal Grandmother        post menopausal  . Diabetes Maternal Grandmother   . Cancer Maternal Grandfather        prostate and lymphoma  . Alzheimer's disease Paternal Grandmother   . Cancer Father 63       prostate cancer  . Aneurysm Paternal  Grandfather        d. 50s    Social History   Tobacco Use  . Smoking status: Current Some Day Smoker    Packs/day: 0.50    Years: 5.00    Pack years: 2.50    Types: Cigarettes  . Smokeless tobacco: Never Used  . Tobacco comment: Actively trying to quit.  Vaping Use  . Vaping Use: Never used  Substance Use Topics  . Alcohol use: Yes    Alcohol/week: 7.0 - 14.0 standard drinks    Types: 7 - 14 Glasses of wine per week    Comment: glass of wine each night   . Drug use: Not Currently    Types: Marijuana    Comment: not during pregnancy, last use was 03/27/2019    Home Medications Prior to Admission medications   Medication Sig Start Date End Date Taking? Authorizing Provider  acetaminophen (TYLENOL) 325 MG tablet Take 2 tablets (650 mg total) by mouth every 4 (four) hours as needed (for pain scale < 4). 08/23/18   Sigmon, Tyler Deis, CNM  calcitRIOL (ROCALTROL) 0.5 MCG capsule Take 1 capsule (0.5 mcg total) by mouth 2 (two) times a week. 07/31/20   Magrinat, Virgie Dad, MD  dexamethasone (DECADRON) 4 MG tablet Take 2 tablets (8 mg total) by mouth 2 (two) times daily. Start the day before Taxotere. Then take daily x 3 days after chemotherapy. Patient not taking: No sig reported 07/08/20   Magrinat, Virgie Dad, MD  doxycycline (VIBRA-TABS) 100 MG tablet Take 1 tablet (100 mg total) by mouth daily. 07/30/20   Magrinat, Virgie Dad, MD  ibuprofen (ADVIL) 600 MG tablet Take 1 tablet (600 mg total) by mouth every 6 (six) hours. Patient not taking: No sig reported 08/23/18   Darliss Cheney, CNM  levonorgestrel (MIRENA) 20 MCG/24HR IUD 1 each by Intrauterine route once.    [provider]  levothyroxine (SYNTHROID) 175 MCG tablet 1 tablet in the morning on an empty stomach    [provider]  lidocaine-prilocaine (EMLA) cream Apply to affected area once Patient not taking: No sig reported 07/08/20   Magrinat, Virgie Dad, MD  loratadine (CLARITIN) 10 MG tablet Take 1 tablet (10 mg  total) by mouth daily. Patient not taking: No sig reported 07/08/20   Magrinat, Virgie Dad, MD  ondansetron (ZOFRAN) 8 MG tablet Take 1 tablet (8 mg total) by mouth 2 (two) times daily as needed (Nausea or vomiting). Start on the third day after chemotherapy. Patient not taking: No sig reported 07/08/20   Magrinat, Virgie Dad, MD  prochlorperazine (COMPAZINE) 10 MG tablet Take 1 tablet (10  mg total) by mouth every 6 (six) hours as needed (Nausea or vomiting). Patient not taking: No sig reported 07/08/20   Magrinat, Virgie Dad, MD  sertraline (ZOLOFT) 50 MG tablet Take 50 mg by mouth at bedtime. 01/26/17   [provider]  valACYclovir (VALTREX) 500 MG tablet Take 1 tablet (500 mg total) by mouth daily. 07/30/20   Magrinat, Virgie Dad, MD  VYVANSE 40 MG capsule Take 40 mg by mouth every morning. 06/29/20   [provider]    Allergies    Patient has no known allergies.  Review of Systems   Review of Systems  All other systems reviewed and are negative.   Physical Exam Updated Vital Signs BP 125/86   Pulse 86   Temp 98.2 F (36.8 C) (Oral)   Resp 15   Ht 5' 6"  (1.676 m)   Wt 72.1 kg   SpO2 100%   BMI 25.66 kg/m   Physical Exam Vitals and nursing Little reviewed.  Constitutional:      General: She is not in acute distress.    Appearance: Normal appearance. She is well-developed.  HENT:     Head: Normocephalic and atraumatic.     Mouth/Throat:     Mouth: Mucous membranes are dry.  Eyes:     Conjunctiva/sclera: Conjunctivae normal.     Pupils: Pupils are equal, round, and reactive to light.  Cardiovascular:     Rate and Rhythm: Normal rate and regular rhythm.     Heart sounds: Normal heart sounds.  Pulmonary:     Effort: Pulmonary effort is normal. No respiratory distress.     Breath sounds: Normal breath sounds.  Abdominal:     General: There is no distension.     Palpations: Abdomen is soft.     Tenderness: There is no abdominal tenderness.  Musculoskeletal:         General: No deformity. Normal range of motion.     Cervical back: Normal range of motion and neck supple.  Skin:    General: Skin is warm and dry.  Neurological:     General: No focal deficit present.     Mental Status: She is alert and oriented to person, place, and time.     ED Results / Procedures / Treatments   Labs (all labs ordered are listed, but only abnormal results are displayed) Labs Reviewed  COMPREHENSIVE METABOLIC PANEL - Abnormal; Notable for the following components:      Result Value   Sodium 131 (*)    Potassium 3.1 (*)    Chloride 97 (*)    Glucose, Bld 101 (*)    Calcium 6.4 (*)    ALT 110 (*)    All other components within normal limits  CBC WITH DIFFERENTIAL/PLATELET - Abnormal; Notable for the following components:   WBC 16.2 (*)    Neutro Abs 9.6 (*)    Monocytes Absolute 2.2 (*)    Abs Immature Granulocytes 0.85 (*)    All other components within normal limits  RESP PANEL BY RT-PCR (FLU A&B, COVID) ARPGX2  MAGNESIUM  PHOSPHORUS  CALCIUM, IONIZED  CK  URINALYSIS, ROUTINE W REFLEX MICROSCOPIC  OSMOLALITY, URINE  SODIUM, URINE, RANDOM  CREATININE, URINE, RANDOM  OSMOLALITY    EKG EKG Interpretation  Date/Time:  Wednesday Jul 30 2020 21:52:41 EDT Ventricular Rate:  84 PR Interval:  179 QRS Duration: 75 QT Interval:  453 QTC Calculation: 536 R Axis:   52 Text Interpretation: Sinus rhythm Biatrial enlargement RSR' in  V1 or V2, probably normal variant Borderline abnrm T, anterolateral leads Prolonged QT interval 12 Lead; [place]ar Confirmed by Dene Gentry [phone]) on 07/30/2020 9:58:52 PM   Radiology No results found.  Procedures Procedures   Medications Ordered in ED Medications  sodium chloride 0.9 % bolus 1,000 mL (1,000 mLs Intravenous New Bag/Given 07/30/20 2038)  ondansetron (ZOFRAN) injection 4 mg (4 mg Intravenous Given 07/30/20 2030)  acetaminophen (TYLENOL) tablet 1,000 mg (1,000 mg Oral Given 07/30/20 2017)  calcium  gluconate 1 g/ 50 mL sodium chloride IVPB (0 g Intravenous Stopped 07/30/20 2138)    ED Course  I have reviewed the triage vital signs and the nursing notes.  Pertinent labs & imaging results that were available during my care of the patient were reviewed by me and considered in my medical decision making (see chart for details).    MDM Rules/Calculators/A&P                          MDM  MSE complete  Kayla Little was evaluated in Emergency Department on 07/30/2020 for the symptoms described in the history of present illness. She was evaluated in the context of the global COVID-19 pandemic, which necessitated consideration that the patient might be at risk for infection with the SARS-CoV-2 virus that causes COVID-19. Institutional protocols and algorithms that pertain to the evaluation of patients at risk for COVID-19 are in a state of rapid change based on information released by regulatory bodies including the CDC and federal and state organizations. These policies and algorithms were followed during the patient's care in the ED.   Patient is presenting with complaint of nausea, vomiting, diarrhea, paresthesias, muscular cramps.  Patient's symptoms started this afternoon after taking a dose of doxycycline.  She is in the middle of chemo for recently diagnosed breast cancer.  Patient with prior history of hypocalcemia.  A portion of her symptoms today are consistent with prior episodes of hypocalcemia.  Patient without fever or suggestion of infection.  Patient is likely dehydrated based on exam.  Calcium and IV fluids administered.  Patient would likely benefit from overnight observation and additional electrolyte correction.   Dr. Roel Cluck of the hospitalist service is aware of case and will evaluate for admission.   Final Clinical Impression(s) / ED Diagnoses Final diagnoses:  Diarrhea, unspecified type  Dehydration  Hypokalemia  Hypocalcemia    Rx / DC Orders ED Discharge  Orders    None       Valarie Merino, MD 07/30/20 2250

## 2020-07-30 NOTE — ED Triage Notes (Signed)
Patient presents with concerns of low calcium due to a lab value of 7.3 today, numbness and shaking. She also complains of a rash on her face, body aches, emesis and diarrhea. The patient took a calcium supplement and doxycyline with food, but vomited 1.5 hours later. She is one week post her first chemo treatment.

## 2020-07-31 ENCOUNTER — Telehealth: Payer: Self-pay | Admitting: Oncology

## 2020-07-31 ENCOUNTER — Encounter: Payer: Self-pay | Admitting: *Deleted

## 2020-07-31 ENCOUNTER — Other Ambulatory Visit: Payer: Self-pay | Admitting: Oncology

## 2020-07-31 DIAGNOSIS — F17210 Nicotine dependence, cigarettes, uncomplicated: Secondary | ICD-10-CM | POA: Diagnosis present

## 2020-07-31 DIAGNOSIS — T451X5A Adverse effect of antineoplastic and immunosuppressive drugs, initial encounter: Secondary | ICD-10-CM | POA: Diagnosis present

## 2020-07-31 DIAGNOSIS — E860 Dehydration: Secondary | ICD-10-CM | POA: Diagnosis present

## 2020-07-31 DIAGNOSIS — K521 Toxic gastroenteritis and colitis: Secondary | ICD-10-CM | POA: Diagnosis present

## 2020-07-31 DIAGNOSIS — E871 Hypo-osmolality and hyponatremia: Secondary | ICD-10-CM | POA: Diagnosis present

## 2020-07-31 DIAGNOSIS — C50312 Malignant neoplasm of lower-inner quadrant of left female breast: Secondary | ICD-10-CM | POA: Diagnosis not present

## 2020-07-31 DIAGNOSIS — Z8041 Family history of malignant neoplasm of ovary: Secondary | ICD-10-CM | POA: Diagnosis not present

## 2020-07-31 DIAGNOSIS — Z803 Family history of malignant neoplasm of breast: Secondary | ICD-10-CM | POA: Diagnosis not present

## 2020-07-31 DIAGNOSIS — Z8042 Family history of malignant neoplasm of prostate: Secondary | ICD-10-CM | POA: Diagnosis not present

## 2020-07-31 DIAGNOSIS — E890 Postprocedural hypothyroidism: Secondary | ICD-10-CM | POA: Diagnosis present

## 2020-07-31 DIAGNOSIS — Z87442 Personal history of urinary calculi: Secondary | ICD-10-CM | POA: Diagnosis not present

## 2020-07-31 DIAGNOSIS — Z807 Family history of other malignant neoplasms of lymphoid, hematopoietic and related tissues: Secondary | ICD-10-CM | POA: Diagnosis not present

## 2020-07-31 DIAGNOSIS — C50211 Malignant neoplasm of upper-inner quadrant of right female breast: Secondary | ICD-10-CM | POA: Diagnosis present

## 2020-07-31 DIAGNOSIS — Z833 Family history of diabetes mellitus: Secondary | ICD-10-CM | POA: Diagnosis not present

## 2020-07-31 DIAGNOSIS — Z79890 Hormone replacement therapy (postmenopausal): Secondary | ICD-10-CM | POA: Diagnosis not present

## 2020-07-31 DIAGNOSIS — Z79899 Other long term (current) drug therapy: Secondary | ICD-10-CM | POA: Diagnosis not present

## 2020-07-31 DIAGNOSIS — L719 Rosacea, unspecified: Secondary | ICD-10-CM | POA: Diagnosis present

## 2020-07-31 DIAGNOSIS — Z85850 Personal history of malignant neoplasm of thyroid: Secondary | ICD-10-CM | POA: Diagnosis not present

## 2020-07-31 DIAGNOSIS — E876 Hypokalemia: Secondary | ICD-10-CM | POA: Diagnosis present

## 2020-07-31 DIAGNOSIS — Z820 Family history of epilepsy and other diseases of the nervous system: Secondary | ICD-10-CM | POA: Diagnosis not present

## 2020-07-31 DIAGNOSIS — Z170 Estrogen receptor positive status [ER+]: Secondary | ICD-10-CM | POA: Diagnosis not present

## 2020-07-31 DIAGNOSIS — R9431 Abnormal electrocardiogram [ECG] [EKG]: Secondary | ICD-10-CM | POA: Diagnosis present

## 2020-07-31 DIAGNOSIS — Z20822 Contact with and (suspected) exposure to covid-19: Secondary | ICD-10-CM | POA: Diagnosis present

## 2020-07-31 LAB — PHOSPHORUS: Phosphorus: 3.1 mg/dL (ref 2.5–4.6)

## 2020-07-31 LAB — URINALYSIS, ROUTINE W REFLEX MICROSCOPIC
Bilirubin Urine: NEGATIVE
Glucose, UA: NEGATIVE mg/dL
Hgb urine dipstick: NEGATIVE
Ketones, ur: NEGATIVE mg/dL
Leukocytes,Ua: NEGATIVE
Nitrite: NEGATIVE
Protein, ur: NEGATIVE mg/dL
Specific Gravity, Urine: 1.005 (ref 1.005–1.030)
pH: 6 (ref 5.0–8.0)

## 2020-07-31 LAB — CBC WITH DIFFERENTIAL/PLATELET
Abs Immature Granulocytes: 1.39 10*3/uL — ABNORMAL HIGH (ref 0.00–0.07)
Basophils Absolute: 0 10*3/uL (ref 0.0–0.1)
Basophils Relative: 0 %
Eosinophils Absolute: 0 10*3/uL (ref 0.0–0.5)
Eosinophils Relative: 0 %
HCT: 42.4 % (ref 36.0–46.0)
Hemoglobin: 14.2 g/dL (ref 12.0–15.0)
Immature Granulocytes: 7 %
Lymphocytes Relative: 21 %
Lymphs Abs: 4.2 10*3/uL — ABNORMAL HIGH (ref 0.7–4.0)
MCH: 33.4 pg (ref 26.0–34.0)
MCHC: 33.5 g/dL (ref 30.0–36.0)
MCV: 99.8 fL (ref 80.0–100.0)
Monocytes Absolute: 2 10*3/uL — ABNORMAL HIGH (ref 0.1–1.0)
Monocytes Relative: 10 %
Neutro Abs: 11.9 10*3/uL — ABNORMAL HIGH (ref 1.7–7.7)
Neutrophils Relative %: 62 %
Platelets: 274 10*3/uL (ref 150–400)
RBC: 4.25 MIL/uL (ref 3.87–5.11)
RDW: 12.5 % (ref 11.5–15.5)
WBC: 19.5 10*3/uL — ABNORMAL HIGH (ref 4.0–10.5)
nRBC: 0 % (ref 0.0–0.2)

## 2020-07-31 LAB — COMPREHENSIVE METABOLIC PANEL
ALT: 94 U/L — ABNORMAL HIGH (ref 0–44)
AST: 34 U/L (ref 15–41)
Albumin: 3.7 g/dL (ref 3.5–5.0)
Alkaline Phosphatase: 56 U/L (ref 38–126)
Anion gap: 7 (ref 5–15)
BUN: 6 mg/dL (ref 6–20)
CO2: 22 mmol/L (ref 22–32)
Calcium: 6.1 mg/dL — CL (ref 8.9–10.3)
Chloride: 104 mmol/L (ref 98–111)
Creatinine, Ser: 0.82 mg/dL (ref 0.44–1.00)
GFR, Estimated: >60 mL/min (ref >60)
Glucose, Bld: 98 mg/dL (ref 70–99)
Potassium: 3.3 mmol/L — ABNORMAL LOW (ref 3.5–5.1)
Sodium: 133 mmol/L — ABNORMAL LOW (ref 135–145)
Total Bilirubin: 0.3 mg/dL (ref 0.3–1.2)
Total Protein: 6.3 g/dL — ABNORMAL LOW (ref 6.5–8.1)

## 2020-07-31 LAB — LACTIC ACID, PLASMA: Lactic Acid, Venous: 0.6 mmol/L (ref 0.5–1.9)

## 2020-07-31 LAB — CK: Total CK: 68 U/L (ref 38–234)

## 2020-07-31 LAB — SODIUM, URINE, RANDOM: Sodium, Ur: <10 mmol/L

## 2020-07-31 LAB — T4, FREE: Free T4: 0.72 ng/dL (ref 0.61–1.12)

## 2020-07-31 LAB — OSMOLALITY: Osmolality: 278 mosm/kg (ref 275–295)

## 2020-07-31 LAB — MAGNESIUM: Magnesium: 2.4 mg/dL (ref 1.7–2.4)

## 2020-07-31 LAB — TSH: TSH: 45.098 u[IU]/mL — ABNORMAL HIGH (ref 0.350–4.500)

## 2020-07-31 LAB — OSMOLALITY, URINE: Osmolality, Ur: 155 mOsm/kg — ABNORMAL LOW (ref 300–900)

## 2020-07-31 LAB — CALCITONIN

## 2020-07-31 LAB — CREATININE, URINE, RANDOM: Creatinine, Urine: 70.84 mg/dL

## 2020-07-31 LAB — CANCER ANTIGEN 27.29: CA 27.29: 15.9 U/mL (ref 0.0–38.6)

## 2020-07-31 LAB — HIV ANTIBODY (ROUTINE TESTING W REFLEX): HIV Screen 4th Generation wRfx: NONREACTIVE

## 2020-07-31 MED ORDER — CHOLESTYRAMINE LIGHT 4 G PO PACK
4.0000 g | PACK | Freq: Two times a day (BID) | ORAL | Status: DC
  Administered 2020-07-31 – 2020-08-01 (×3): 4 g via ORAL
  Filled 2020-07-31 (×3): qty 1

## 2020-07-31 MED ORDER — CALCITRIOL 0.5 MCG PO CAPS
0.5000 ug | ORAL_CAPSULE | Freq: Every day | ORAL | Status: DC
  Administered 2020-07-31 – 2020-08-01 (×2): 0.5 ug via ORAL
  Filled 2020-07-31 (×2): qty 1

## 2020-07-31 MED ORDER — CHLORHEXIDINE GLUCONATE CLOTH 2 % EX PADS
6.0000 | MEDICATED_PAD | Freq: Every day | CUTANEOUS | Status: DC
  Administered 2020-07-31: 6 via TOPICAL

## 2020-07-31 MED ORDER — CALCIUM GLUCONATE-NACL 2-0.675 GM/100ML-% IV SOLN
2.0000 g | Freq: Two times a day (BID) | INTRAVENOUS | Status: AC
  Administered 2020-07-31 (×2): 2000 mg via INTRAVENOUS
  Filled 2020-07-31 (×2): qty 100

## 2020-07-31 MED ORDER — CHOLESTYRAMINE 4 G PO PACK
4.0000 g | PACK | Freq: Two times a day (BID) | ORAL | Status: DC
  Filled 2020-07-31: qty 1

## 2020-07-31 MED ORDER — LOPERAMIDE HCL 2 MG PO CAPS
2.0000 mg | ORAL_CAPSULE | ORAL | Status: DC | PRN
Start: 2020-07-31 — End: 2020-08-01
  Administered 2020-07-31 (×2): 2 mg via ORAL
  Filled 2020-07-31 (×2): qty 1

## 2020-07-31 MED ORDER — CALCIUM CARBONATE 1250 (500 CA) MG PO TABS
1.0000 | ORAL_TABLET | Freq: Every day | ORAL | Status: DC
  Administered 2020-07-31 – 2020-08-01 (×2): 500 mg via ORAL
  Filled 2020-07-31 (×3): qty 1

## 2020-07-31 MED ORDER — POTASSIUM CHLORIDE 10 MEQ/100ML IV SOLN
INTRAVENOUS | Status: AC
  Administered 2020-07-31: 10 meq via INTRAVENOUS
  Filled 2020-07-31: qty 100

## 2020-07-31 MED ORDER — LORATADINE 10 MG PO TABS
10.0000 mg | ORAL_TABLET | Freq: Every day | ORAL | Status: DC
  Administered 2020-07-31 – 2020-08-01 (×2): 10 mg via ORAL
  Filled 2020-07-31 (×2): qty 1

## 2020-07-31 MED ORDER — SERTRALINE HCL 50 MG PO TABS
50.0000 mg | ORAL_TABLET | Freq: Every day | ORAL | Status: DC
  Administered 2020-07-31: 50 mg via ORAL
  Filled 2020-07-31: qty 1

## 2020-07-31 MED ORDER — MAGIC MOUTHWASH
5.0000 mL | Freq: Three times a day (TID) | ORAL | Status: DC | PRN
  Filled 2020-07-31: qty 5

## 2020-07-31 MED ORDER — PROCHLORPERAZINE EDISYLATE 10 MG/2ML IJ SOLN
10.0000 mg | Freq: Four times a day (QID) | INTRAMUSCULAR | Status: DC | PRN
  Administered 2020-07-31: 10 mg via INTRAVENOUS
  Filled 2020-07-31 (×2): qty 2

## 2020-07-31 MED ORDER — NICOTINE 14 MG/24HR TD PT24
14.0000 mg | MEDICATED_PATCH | Freq: Every day | TRANSDERMAL | Status: DC
  Administered 2020-07-31 – 2020-08-01 (×2): 14 mg via TRANSDERMAL
  Filled 2020-07-31 (×2): qty 1

## 2020-07-31 NOTE — Progress Notes (Signed)
Addendum: forgot to mention in my note that patient received pegfilgrastim 07/25/2020, accounting for her elevated WBC  GM

## 2020-07-31 NOTE — Telephone Encounter (Signed)
Scheduled appointment per 05/25 los. Patient is aware.

## 2020-07-31 NOTE — Telephone Encounter (Signed)
Scheduled appts per 5/24 sch msg. Pt aware. Pt will review appts in MyChart and will call back if anything needs to be adjusted.

## 2020-07-31 NOTE — Progress Notes (Signed)
SHASHANA FULLINGTON   DOB:1986/01/18   IZ#:[id number]   PJA#:[id number]  Subjective:  I saw [name] yesterday in follow-up after her first cycle of chemotherapy--please see the note for details.  At that time she reported diarrhea which is very common with epratuzumab and generally well controlled with Questran plus or minus Imodium.  She also reported some paresthesias which she associates with low calcium and indeed her calcium was low yesterday.  I wrote her for calcitriol, suggested she take extra calcium orally and contact her endocrinologist Dr. Buddy Duty for further instructions.--However overnight she developed more diarrhea and nausea and vomiting.  I had written for doxycycline to treat her rosacea (a common side effect of steroids given with chemotherapy [place]).  Not long after that she started the vomiting and stomach cramps so very likely that medication may have precipitated that.  At any rate she presented to the emergency room where her calcium was found to be even lower and she was having more paresthesias.  She is still having diarrhea as well.  She is being admitted for further evaluation and correction of the electrolyte imbalances   Objective: White woman examined in her emergency room bed Vitals:   07/31/20 0828 07/31/20 0830  BP: 110/74 110/74  Pulse: 85 85  Resp: 12 17  Temp: 98.4 F (36.9 C)   SpO2: 98% 99%    Body mass index is 25.66 kg/m.  Intake/Output Summary (Last 24 hours) at 07/31/2020 0938 Last data filed at 07/31/2020 0554 Gross per 24 hour  Intake 1442.42 ml  Output --  Net 1442.42 ml     Sclerae unicteric  Lungs no rales or wheezes--auscultated anterolaterally  Heart regular rate and rhythm  Abdomen soft, +BS  Neuro nonfocal  Breast exam: Deferred  CBG (last 3)  No results for input(s): GLUCAP in the last 72 hours.   Labs:  Lab Results  Component Value Date   WBC 19.5 (H) 07/31/2020   HGB 14.2 07/31/2020   HCT 42.4 07/31/2020   MCV 99.8 07/31/2020   PLT  274 07/31/2020   NEUTROABS 11.9 (H) 07/31/2020    _0 @  Urine Studies No results for input(s): UHGB, CRYS in the last 72 hours.  Invalid input(s): UACOL, UAPR, USPG, UPH, UTP, UGL, UKET, UBIL, UNIT, UROB, ULEU, UEPI, UWBC, URBC, UBAC, CAST, UCOM, [place]  Basic Metabolic Panel: Recent Labs  Lab 07/30/20 1157 07/30/20 2040 07/31/20 0559  NA 136 131* 133*  K 3.7 3.1* 3.3*  CL 98 97* 104  CO2 _1 GLUCOSE 92 101* 98  BUN _2 CREATININE 0.93 0.91 0.82  CALCIUM 7.2* 6.4* 6.1*  MG  --  1.8 2.4  PHOS  --  2.8 3.1   GFR Estimated Creatinine Clearance: 95.5 mL/min (by C-G formula based on SCr of 0.82 mg/dL). Liver Function Tests: Recent Labs  Lab 07/30/20 1157 07/30/20 2040 07/31/20 0559  AST 41 36 34  ALT 139* 110* 94*  ALKPHOS 65 63 56  BILITOT 0.4 0.3 0.3  PROT 7.5 7.1 6.3*  ALBUMIN 4.1 4.1 3.7   No results for input(s): LIPASE, AMYLASE in the last 168 hours. No results for input(s): AMMONIA in the last 168 hours. Coagulation profile No results for input(s): INR, PROTIME in the last 168 hours.  CBC: Recent Labs  Lab 07/30/20 1157 07/30/20 2040 07/31/20 0559  WBC 10.0 16.2* 19.5*  NEUTROABS 5.3 9.6* 11.9*  HGB 15.4* 14.6 14.2  HCT 45.6 42.2 42.4  MCV 99.1 98.1 99.8  PLT 252 257 274   Cardiac Enzymes: Recent Labs  Lab 07/31/20 0558  CKTOTAL 68   BNP: Invalid input(s): POCBNP CBG: No results for input(s): GLUCAP in the last 168 hours. D-Dimer No results for input(s): DDIMER in the last 72 hours. Hgb A1c No results for input(s): HGBA1C in the last 72 hours. Lipid Profile No results for input(s): CHOL, HDL, LDLCALC, TRIG, CHOLHDL, LDLDIRECT in the last 72 hours. Thyroid function studies Recent Labs    07/31/20 0559  TSH 45.098*   Anemia work up No results for input(s): VITAMINB12, FOLATE, FERRITIN, TIBC, IRON, RETICCTPCT in the last 72 hours. Microbiology Recent Results (from the past 240 hour(s))  Resp Panel by RT-PCR (Flu  A&B, Covid) Nasopharyngeal Swab     Status: None   Collection Time: 07/30/20 10:37 PM   Specimen: Nasopharyngeal Swab; Nasopharyngeal(NP) swabs in vial transport medium  Result Value Ref Range Status   SARS Coronavirus 2 by RT PCR NEGATIVE NEGATIVE Final    Comment: (NOTE) SARS-CoV-2 target nucleic acids are NOT DETECTED.  The SARS-CoV-2 RNA is generally detectable in upper respiratory specimens during the acute phase of infection. The lowest concentration of SARS-CoV-2 viral copies this assay can detect is 138 copies/mL. A negative result does not preclude SARS-Cov-2 infection and should not be used as the sole basis for treatment or other patient management decisions. A negative result may occur with  improper specimen collection/handling, submission of specimen other than nasopharyngeal swab, presence of viral mutation(s) within the areas targeted by this assay, and inadequate number of viral copies(<138 copies/mL). A negative result must be combined with clinical observations, patient history, and epidemiological information. The expected result is Negative.  Fact Sheet for Patients:  EntrepreneurPulse.com.au  Fact Sheet for Healthcare Providers:  IncredibleEmployment.be  This test is no t yet approved or cleared by the [place] FDA and  has been authorized for detection and/or diagnosis of SARS-CoV-2 by FDA under an Emergency Use Authorization (EUA). This EUA will remain  in effect (meaning this test can be used) for the duration of the COVID-19 declaration under Section 564(b)(1) of the Act, 21 U.S.C.section 360bbb-3(b)(1), unless the authorization is terminated  or revoked sooner.       Influenza A by PCR NEGATIVE NEGATIVE Final   Influenza B by PCR NEGATIVE NEGATIVE Final    Comment: (NOTE) The Xpert Xpress SARS-CoV-2/FLU/RSV plus assay is intended as an aid in the diagnosis of influenza from Nasopharyngeal swab specimens  and should not be used as a sole basis for treatment. Nasal washings and aspirates are unacceptable for Xpert Xpress SARS-CoV-2/FLU/RSV testing.  Fact Sheet for Patients: EntrepreneurPulse.com.au  Fact Sheet for Healthcare Providers: IncredibleEmployment.be  This test is not yet approved or cleared by the [place] FDA and has been authorized for detection and/or diagnosis of SARS-CoV-2 by FDA under an Emergency Use Authorization (EUA). This EUA will remain in effect (meaning this test can be used) for the duration of the COVID-19 declaration under Section 564(b)(1) of the Act, 21 U.S.C. section 360bbb-3(b)(1), unless the authorization is terminated or revoked.  Performed at [facility], [place] [address], [place], [place] [address]       Studies:  No results found.  Assessment: 35 y.o. [place] woman with a clinical mT2 N3, stage IIIA invasive ductal carcinoma, grade 1, triple positive             (a) breast MRI 07/06/2020 finds a 1.6 cm lower inner quadrant mass associated with 5.4 cm of  non-mass-like enhancement, at least 8 enlarged right axillary lymph node and 1 right internal mammary lymph node             (b) CT scans of the neck and chest 06/24/2020 shows subcentimeter right cervical adenopathy, 2 right supraclavicular lymph nodes the larger 0.65 cm, also left cervical adenopathy largest measuring 1.02 cm; there is also a 1.2 cm lesion in the left hepatic lobe, and a sclerotic lesion in the mid thoracic spine at T7             (c) PET scan 07/21/2020 shows regional adenopathy (including 2 right subcentimeter supraclavicular lymph nodes and subcentimeter lymph nodes in the right hilar, subcarinal and lateral aortic region), no uptake in liver, and an indeterminate T12 lesion             (d) thoracic spine MRI 07/27/2020 shows the T12 lesion to be likely benign, there is also degenerative disc disease  (1) status post  total thyroidectomy and central compartment lymph node biopsy 04/12/2019 for a pT1a pN0 medullary thyroid carcinoma, with negative margins, no angioinvasion, and no extrathyroidal extension             (a) CEA and calcitonin              (2) genetics testing 10/27/2018 showed              (a) a 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 21 days x 6, started 07/23/2020  (4) anti-HER2 immunotherapy to continue to total 1 year             (a) echo 07/22/2020 shows an ejection fraction in the 60-65% range.  (5) definitive surgery to follow  (6) adjuvant radiation  (7) antiestrogens     Plan:  Joellen Jersey is being admitted for significant hypocalcemia as well as diarrhea, nausea, and vomiting.  Some of her symptoms are clearly related to her chemotherapy.  Pertuzumab is known to cause significant diarrhea.  This is usually well controlled with Questran and I will write her for Questran twice daily.  She may also take Imodium as needed.  Because pertuzumab is not antibody it does not clear for several weeks so her diarrhea may continue for another 2 weeks or perhaps a little longer.  Some of the nausea and vomiting may be due to a reaction to doxycycline.  Certainly we should not resume that medication.  The hypocalcemia however is not going to be related to her chemotherapy.  She has a history of medullary thyroid cancer.  I do not know the exact status of the disease at present.  We obtained a CEA this week which was minimally elevated, just above 5.  We tried to get a calcitonin but the sample was not frozen and therefore has been  canceled.  I am going to write for repeat calcitonin level to be drawn tomorrow morning.  I would recommend also engaging her endocrinologist Dr.Kerr for further advice and management of her hypocalcemia  Will follow with you   Chauncey Cruel, MD 07/31/2020  9:38 AM Medical Oncology and Hematology [facility] [address] [place], [place] [address] Tel. [phone]  Fax. [phone] 

## 2020-07-31 NOTE — Progress Notes (Signed)
PROGRESS NOTE    Kayla Little  GMW:[id number] DOB: [birth year] DOA: 07/30/2020 PCP: [name] [place]   Brief Narrative:  Kayla Little is a 35 y.o. female with medical history significant of  locally advanced breast cancer now on chemotherapy status post port placement, history of medullary thyroid carcinoma, hypocalcemia who saw her oncologist yesterday and was diagnosed with rosacea and hypocalcemia however she was sent home with doxycycline and calcitriol.  After that, she started vomiting and threw up all the medications so she decided to come to the emergency department.  Her calcium was further low upon arrival to ED.  She was hemodynamically stable though.  She was admitted under hospital service for hypocalcemia and other electrolyte abnormalities.   Assessment & Plan:   Active Problems:   Malignant neoplasm of upper-inner quadrant of right breast in female, estrogen receptor positive (HCC)   Hypocalcemia   Hyponatremia   Hypokalemia   Hypercalcemia   Prolonged QT interval   Hypothyroidism  Acute on chronic hypocalcemia: Typically her calcium remains between 7 and 8 but she presented with 6.4 which is down to 6.1 today.  We will replace more constant today.  Recheck in the morning.  Hypokalemia: 3.3 again today.  Will replace.  Recheck in the morning.  Acute hyponatremia: Likely hypovolemic.  Improved from 131 yesterday to 133 today.   Hypothyroidism: Significantly elevated TSH at 45.  Will check free T4 and T3.  No symptoms of hypothyroidism.  Continue home dose of Synthroid.  Prolonged QT interval: Will monitor closely and avoid QT prolonging medications.  Nausea/vomiting: No more vomiting but intermittent nausea.  She does not like Phenergan, unable to prescribe Zofran due to prolonged QT interval.  We will try Compazine.  Facial acne: She was diagnosed with rosacea by oncology yesterday, I doubt she has rosacea but instead she has acne.  Doxycycline  will help with that as well however that is on hold now due to nausea and vomiting and we will resume at the time of discharge.  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  No need to check C. Difficile.  History of breast cancer: Per oncology.  DVT prophylaxis: enoxaparin (LOVENOX) injection 40 mg Start: 07/30/20 2345   Code Status: Full Code  Family Communication: None present at bedside.  Plan of care discussed with patient in length and he verbalized understanding and agreed with it.  Status is: Observation  The patient will require care spanning > 2 midnights and should be moved to inpatient because: Inpatient level of care appropriate due to severity of illness  Dispo: The patient is from: Home              Anticipated d/c is to: Home              Patient currently is not medically stable to d/c.   Difficult to place patient No        Estimated body mass index is 25.66 kg/m as calculated from the following:   Height as of this encounter: 5\' 6"  (1.676 m).   Weight as of this encounter: 72.1 kg.      Nutritional status:               Consultants:   Oncology  Procedures:   None  Antimicrobials:  Anti-infectives (From admission, onward)   None         Subjective: Seen and examined.  She feels better.  Some  nausea but no vomiting.  No other complaint.  Objective: Vitals:   07/31/20 0500 07/31/20 0828 07/31/20 0830 07/31/20 0939  BP: 108/68 110/74 110/74 104/75  Pulse: 90 85 85 79  Resp: 17 12 17 16   Temp:  98.4 F (36.9 C)  98.2 F (36.8 C)  TempSrc:  Oral  Oral  SpO2: 97% 98% 99% 99%  Weight:      Height:        Intake/Output Summary (Last 24 hours) at 07/31/2020 1145 Last data filed at 07/31/2020 0554 Gross per 24 hour  Intake 1442.42 ml  Output --  Net 1442.42 ml   Filed Weights   07/30/20 1947  Weight: 72.1 kg    Examination:  General exam: Appears calm and comfortable   Respiratory system: Clear to auscultation. Respiratory effort normal. Cardiovascular system: S1 & S2 heard, RRR. No JVD, murmurs, rubs, gallops or clicks. No pedal edema. Gastrointestinal system: Abdomen is nondistended, soft and nontender. No organomegaly or masses felt. Normal bowel sounds heard. Central nervous system: Alert and oriented. No focal neurological deficits. Extremities: Symmetric 5 x 5 power. Skin: No rashes, lesions or ulcers Psychiatry: Judgement and insight appear normal. Mood & affect appropriate.    Data Reviewed: I have personally reviewed following labs and imaging studies  CBC: Recent Labs  Lab 07/30/20 1157 07/30/20 2040 07/31/20 0559  WBC 10.0 16.2* 19.5*  NEUTROABS 5.3 9.6* 11.9*  HGB 15.4* 14.6 14.2  HCT 45.6 42.2 42.4  MCV 99.1 98.1 99.8  PLT 252 257 071   Basic Metabolic Panel: Recent Labs  Lab 07/30/20 1157 07/30/20 2040 07/31/20 0559  NA 136 131* 133*  K 3.7 3.1* 3.3*  CL 98 97* 104  CO2 25 23 22   GLUCOSE 92 101* 98  BUN 6 8 6   CREATININE 0.93 0.91 0.82  CALCIUM 7.2* 6.4* 6.1*  MG  --  1.8 2.4  PHOS  --  2.8 3.1   GFR: Estimated Creatinine Clearance: 95.5 mL/min (by C-G formula based on SCr of 0.82 mg/dL). Liver Function Tests: Recent Labs  Lab 07/30/20 1157 07/30/20 2040 07/31/20 0559  AST 41 36 34  ALT 139* 110* 94*  ALKPHOS 65 63 56  BILITOT 0.4 0.3 0.3  PROT 7.5 7.1 6.3*  ALBUMIN 4.1 4.1 3.7   No results for input(s): LIPASE, AMYLASE in the last 168 hours. No results for input(s): AMMONIA in the last 168 hours. Coagulation Profile: No results for input(s): INR, PROTIME in the last 168 hours. Cardiac Enzymes: Recent Labs  Lab 07/31/20 0558  CKTOTAL 68   BNP (last 3 results) No results for input(s): PROBNP in the last 8760 hours. HbA1C: No results for input(s): HGBA1C in the last 72 hours. CBG: No results for input(s): GLUCAP in the last 168 hours. Lipid Profile: No results for input(s): CHOL, HDL, LDLCALC,  TRIG, CHOLHDL, LDLDIRECT in the last 72 hours. Thyroid Function Tests: Recent Labs    07/31/20 0559  TSH 45.098*   Anemia Panel: No results for input(s): VITAMINB12, FOLATE, FERRITIN, TIBC, IRON, RETICCTPCT in the last 72 hours. Sepsis Labs: Recent Labs  Lab 07/31/20 0040  LATICACIDVEN 0.6    Recent Results (from the past 240 hour(s))  Resp Panel by RT-PCR (Flu A&B, Covid) Nasopharyngeal Swab     Status: None   Collection Time: 07/30/20 10:37 PM   Specimen: Nasopharyngeal Swab; Nasopharyngeal(NP) swabs in vial transport medium  Result Value Ref Range Status   SARS Coronavirus 2 by RT PCR NEGATIVE NEGATIVE Final  Comment: (NOTE) SARS-CoV-2 target nucleic acids are NOT DETECTED.  The SARS-CoV-2 RNA is generally detectable in upper respiratory specimens during the acute phase of infection. The lowest concentration of SARS-CoV-2 viral copies this assay can detect is 138 copies/mL. A negative result does not preclude SARS-Cov-2 infection and should not be used as the sole basis for treatment or other patient management decisions. A negative result may occur with  improper specimen collection/handling, submission of specimen other than nasopharyngeal swab, presence of viral mutation(s) within the areas targeted by this assay, and inadequate number of viral copies(<138 copies/mL). A negative result must be combined with clinical observations, patient history, and epidemiological information. The expected result is Negative.  Fact Sheet for Patients:  EntrepreneurPulse.com.au  Fact Sheet for Healthcare Providers:  IncredibleEmployment.be  This test is no t yet approved or cleared by the [place] FDA and  has been authorized for detection and/or diagnosis of SARS-CoV-2 by FDA under an Emergency Use Authorization (EUA). This EUA will remain  in effect (meaning this test can be used) for the duration of the COVID-19 declaration under  Section 564(b)(1) of the Act, 21 U.S.C.section 360bbb-3(b)(1), unless the authorization is terminated  or revoked sooner.       Influenza A by PCR NEGATIVE NEGATIVE Final   Influenza B by PCR NEGATIVE NEGATIVE Final    Comment: (NOTE) The Xpert Xpress SARS-CoV-2/FLU/RSV plus assay is intended as an aid in the diagnosis of influenza from Nasopharyngeal swab specimens and should not be used as a sole basis for treatment. Nasal washings and aspirates are unacceptable for Xpert Xpress SARS-CoV-2/FLU/RSV testing.  Fact Sheet for Patients: EntrepreneurPulse.com.au  Fact Sheet for Healthcare Providers: IncredibleEmployment.be  This test is not yet approved or cleared by the [place] FDA and has been authorized for detection and/or diagnosis of SARS-CoV-2 by FDA under an Emergency Use Authorization (EUA). This EUA will remain in effect (meaning this test can be used) for the duration of the COVID-19 declaration under Section 564(b)(1) of the Act, 21 U.S.C. section 360bbb-3(b)(1), unless the authorization is terminated or revoked.  Performed at [facility], [place] [address], [place], [place] [address]       Radiology Studies: No results found.  Scheduled Meds: . calcitRIOL  0.5 mcg Oral Daily  . calcium carbonate  1 tablet Oral Q breakfast  . cholestyramine light  4 g Oral BID  . enoxaparin (LOVENOX) injection  40 mg Subcutaneous Q24H  . levothyroxine  175 mcg Oral Q0600   Continuous Infusions: . calcium gluconate 2,000 mg (07/31/20 0946)     LOS: 0 days   Time spent: 35 minutes   Darliss Cheney, MD [facility]  07/31/2020, 11:45 AM   How to contact the [facility] Attending or Consulting provider [place] or covering provider during after hours [place], for this patient?  1. Check the care team in [facility] and look for a) attending/consulting [facility] provider listed and b) the [facility] team listed. Page or secure chat  [id number]. 2. Log into [email or web address] and use [place]'s universal password to access. If you do not have the password, please contact the hospital operator. 3. Locate the [facility] provider you are looking for under [facility] and page to a number that you can be directly reached. 4. If you still have difficulty reaching the provider, please page the [facility] (Director on Call) for the Hospitalists listed on amion for assistance.

## 2020-07-31 NOTE — ED Notes (Signed)
I gave patient a cup of beef booth

## 2020-07-31 NOTE — ED Notes (Signed)
RN notified of abnormal lab 

## 2020-08-01 DIAGNOSIS — K521 Toxic gastroenteritis and colitis: Secondary | ICD-10-CM

## 2020-08-01 DIAGNOSIS — T451X5A Adverse effect of antineoplastic and immunosuppressive drugs, initial encounter: Secondary | ICD-10-CM | POA: Diagnosis not present

## 2020-08-01 DIAGNOSIS — R112 Nausea with vomiting, unspecified: Secondary | ICD-10-CM

## 2020-08-01 LAB — BASIC METABOLIC PANEL
Anion gap: 7 (ref 5–15)
BUN: <5 mg/dL — ABNORMAL LOW (ref 6–20)
CO2: 23 mmol/L (ref 22–32)
Calcium: 7.4 mg/dL — ABNORMAL LOW (ref 8.9–10.3)
Chloride: 105 mmol/L (ref 98–111)
Creatinine, Ser: 0.73 mg/dL (ref 0.44–1.00)
GFR, Estimated: >60 mL/min (ref >60)
Glucose, Bld: 104 mg/dL — ABNORMAL HIGH (ref 70–99)
Potassium: 3.3 mmol/L — ABNORMAL LOW (ref 3.5–5.1)
Sodium: 135 mmol/L (ref 135–145)

## 2020-08-01 LAB — CALCIUM, IONIZED
Calcium, Ionized, Serum: 3.4 mg/dL — ABNORMAL LOW (ref 4.5–5.6)
Calcium, Ionized, Serum: 4 mg/dL — ABNORMAL LOW (ref 4.5–5.6)

## 2020-08-01 LAB — T3, FREE: T3, Free: 1.5 pg/mL — ABNORMAL LOW (ref 2.0–4.4)

## 2020-08-01 MED ORDER — HEPARIN SOD (PORK) LOCK FLUSH 100 UNIT/ML IV SOLN: 500.0000 [IU] | INTRAVENOUS | Status: DC | PRN

## 2020-08-01 MED ORDER — POTASSIUM CHLORIDE CRYS ER 20 MEQ PO TBCR
40.0000 meq | EXTENDED_RELEASE_TABLET | Freq: Once | ORAL | Status: AC
  Administered 2020-08-01: 40 meq via ORAL
  Filled 2020-08-01: qty 2

## 2020-08-01 NOTE — Plan of Care (Signed)

## 2020-08-01 NOTE — Discharge Instructions (Signed)
Diarrhea, Adult Diarrhea is frequent loose and watery bowel movements. Diarrhea can make you feel weak and cause you to become dehydrated. Dehydration can make you tired and thirsty, cause you to have a dry mouth, and decrease how often you urinate. Diarrhea typically lasts 2-3 days. However, it can last longer if it is a sign of something more serious. It is important to treat your diarrhea as told by your health care provider. Follow these instructions at home: Eating and drinking Follow these recommendations as told by your health care provider:  Take an oral rehydration solution (ORS). This is an over-the-counter medicine that helps return your body to its normal balance of nutrients and water. It is found at pharmacies and retail stores.  Drink plenty of fluids, such as water, ice chips, diluted fruit juice, and low-calorie sports drinks. You can drink milk also, if desired.  Avoid drinking fluids that contain a lot of sugar or caffeine, such as energy drinks, sports drinks, and soda.  Eat bland, easy-to-digest foods in small amounts as you are able. These foods include bananas, applesauce, rice, lean meats, toast, and crackers.  Avoid alcohol.  Avoid spicy or fatty foods.      Medicines  Take over-the-counter and prescription medicines only as told by your health care provider.  If you were prescribed an antibiotic medicine, take it as told by your health care provider. Do not stop using the antibiotic even if you start to feel better. General instructions  Wash your hands often using soap and water. If soap and water are not available, use a hand sanitizer. Others in the household should wash their hands as well. Hands should be washed: ? After using the toilet or changing a diaper. ? Before preparing, cooking, or serving food. ? While caring for a sick person or while visiting someone in a hospital.  Drink enough fluid to keep your urine pale yellow.  Rest at home while you  recover.  Watch your condition for any changes.  Take a warm bath to relieve any burning or pain from frequent diarrhea episodes.  Keep all follow-up visits as told by your health care provider. This is important.   Contact a health care provider if:  You have a fever.  Your diarrhea gets worse.  You have new symptoms.  You cannot keep fluids down.  You feel light-headed or dizzy.  You have a headache.  You have muscle cramps. Get help right away if:  You have chest pain.  You feel extremely weak or you faint.  You have bloody or black stools or stools that look like tar.  You have severe pain, cramping, or bloating in your abdomen.  You have trouble breathing or you are breathing very quickly.  Your heart is beating very quickly.  Your skin feels cold and clammy.  You feel confused.  You have signs of dehydration, such as: ? Dark urine, very little urine, or no urine. ? Cracked lips. ? Dry mouth. ? Sunken eyes. ? Sleepiness. ? Weakness. Summary  Diarrhea is frequent loose and watery bowel movements. Diarrhea can make you feel weak and cause you to become dehydrated.  Drink enough fluids to keep your urine pale yellow.  Make sure that you wash your hands after using the toilet. If soap and water are not available, use hand sanitizer.  Contact a health care provider if your diarrhea gets worse or you have new symptoms.  Get help right away if you have signs of dehydration.  This information is not intended to replace advice given to you by your health care provider. Make sure you discuss any questions you have with your health care provider. Document Revised: 07/11/2018 Document Reviewed: 07/29/2017 Elsevier Patient Education  2021 [employer].

## 2020-08-01 NOTE — Discharge Summary (Signed)
Physician Discharge Summary  Kayla Little TIW:[id number] DOB: 1985-12-08 DOA: 07/30/2020  PCP: [name] [place] date: 07/30/2020 Discharge date: 08/01/2020 30 Day Unplanned Readmission Risk Score   Flowsheet Row ED to Hosp-Admission (Current) from 07/30/2020 in [facility]  30 Day Unplanned Readmission Risk Score (%) 19.54 Filed at 08/01/2020 0801     This score is the patient's risk of an unplanned readmission within 30 days of being discharged (0 -100%). The score is based on dignosis, age, lab data, medications, orders, and past utilization.   Low:  0-14.9   Medium: 15-21.9   High: 22-29.9   Extreme: 30 and above         Admitted From: Home Disposition: Home  Recommendations for Outpatient Follow-up:  1. Follow up with PCP in 1-2 weeks 2. Follow-up with your primary endocrinologist within 1 week 3. Follow-up with your oncologist as scheduled 4. Please obtain BMP/CBC in one week 5. Please follow up with your PCP on the following pending results: Unresulted Labs (From admission, onward)          Start     Ordered   08/01/20 0500  Calcitonin  Tomorrow morning,   R       Comments: Please note this needs to be frozen    07/31/20 0947   07/31/20 0500  Calcium, ionized  Now then every 6 hours,   R (with STAT, TIMED occurrences)      07/30/20 2251   07/30/20 2252  Gastrointestinal Panel by PCR , Stool  (Gastrointestinal Panel by PCR, Stool                                                                                                                                                     **Does Not include CLOSTRIDIUM DIFFICILE testing. **If CDIFF testing is needed, place order from the "C Difficile Testing" order set.**)  Once,   STAT        07/30/20 2251   07/30/20 2221  Calcium, ionized  Add-on,   AD        07/30/20 2220            Home Health: None Equipment/Devices: None  Discharge Condition: Stable CODE STATUS: Full  code Diet recommendation: Low-sodium  Subjective: Seen and examined.  No complaints.  Nausea vomiting resolved.  Wants to go home.  Brief/Interim Summary: Kayla Little a 35 y.o.femalewith medical history significant of locally advanced breast cancernow on chemotherapystatus post port placement,history of medullary thyroid carcinoma, hypocalcemia who saw her oncologist yesterday and was diagnosed with rosacea and hypocalcemia however she was sent home with doxycycline and calcitriol.  After that, she started vomiting and threw up all the medications so she decided to come to the emergency department.  Her calcium was further low, 6.4 upon arrival  to ED.  She was hemodynamically stable though.  She was admitted under hospital service for hypocalcemia and other electrolyte abnormalities such as hypokalemia.  All electrolytes were replaced.  She was treated with symptomatic medications for her nausea and vomiting which resolved as well.  Patient is tolerating regular diet now.  She wants to go home.  She does not have any further symptoms regarding hypocalcemia.  Calcium improved to 7.4 today.  She is being discharged in stable condition and will resume her home medications and follow-up with PCP, endocrinology as well as oncology.  Discharge Diagnoses:  Active Problems:   Malignant neoplasm of upper-inner quadrant of right breast in female, estrogen receptor positive (HCC)   Hypocalcemia   Hyponatremia   Hypokalemia   Hypercalcemia   Prolonged QT interval   Hypothyroidism   Nausea & vomiting   Chemotherapy-induced diarrhea    Discharge Instructions   Allergies as of 08/01/2020   No Known Allergies     Medication List    TAKE these medications   acetaminophen 325 MG tablet Commonly known as: Tylenol Take 2 tablets (650 mg total) by mouth every 4 (four) hours as needed (for pain scale < 4).   calcitRIOL 0.5 MCG capsule Commonly known as: ROCALTROL Take 1 capsule (0.5 mcg  total) by mouth 2 (two) times a week.   dexamethasone 4 MG tablet Commonly known as: DECADRON Take 2 tablets (8 mg total) by mouth 2 (two) times daily. Start the day before Taxotere. Then take daily x 3 days after chemotherapy.   doxycycline 100 MG tablet Commonly known as: VIBRA-TABS Take 1 tablet (100 mg total) by mouth daily.   levonorgestrel 20 MCG/24HR IUD Commonly known as: MIRENA 1 each by Intrauterine route once.   levothyroxine 175 MCG tablet Commonly known as: SYNTHROID Take 175 mcg by mouth daily before breakfast.   lidocaine-prilocaine cream Commonly known as: EMLA Apply to affected area once What changed:   how much to take  how to take this  when to take this  reasons to take this  additional instructions   loperamide 2 MG capsule Commonly known as: IMODIUM Take 2 mg by mouth as needed for diarrhea or loose stools.   loratadine 10 MG tablet Commonly known as: Claritin Take 1 tablet (10 mg total) by mouth daily.   ondansetron 8 MG tablet Commonly known as: Zofran Take 1 tablet (8 mg total) by mouth 2 (two) times daily as needed (Nausea or vomiting). Start on the third day after chemotherapy.   prochlorperazine 10 MG tablet Commonly known as: COMPAZINE Take 1 tablet (10 mg total) by mouth every 6 (six) hours as needed (Nausea or vomiting).   sertraline 50 MG tablet Commonly known as: ZOLOFT Take 50 mg by mouth at bedtime.   valACYclovir 500 MG tablet Commonly known as: VALTREX Take 1 tablet (500 mg total) by mouth daily.   Vyvanse 40 MG capsule Generic drug: lisdexamfetamine Take 40 mg by mouth every morning.   Vyvanse 60 MG capsule Generic drug: lisdexamfetamine Take 60 mg by mouth every morning.       Follow-up Information    [facility] [place] [facility] [place] Follow up in 1 week(s).   Specialty: Family Medicine Contact information: [place] [address] [phone]              No Known  Allergies  Consultations: Oncology.   Procedures/Studies: MR THORACIC SPINE W WO CONTRAST  Result Date: 07/29/2020 CLINICAL DATA:  Breast  cancer EXAM: MRI THORACIC WITHOUT AND WITH CONTRAST TECHNIQUE: Multiplanar and multiecho pulse sequences of the thoracic spine were obtained without and with intravenous contrast. CONTRAST:  32m GADAVIST GADOBUTROL 1 MMOL/ML IV SOLN COMPARISON:  Head CT 07/21/2020 FINDINGS: Alignment:  Normal Vertebrae: Approximately 1 cm T1 and T2 hypointensity along the left upper aspect of the T12 vertebral body, adjacent to the disc space. Location adjacent to disc and a peripheral fatty marrow signal with superimposed edematous signal favors a discogenic/degenerative cause. T7 body bone island.  T10 hemangioma. No fracture, discitis, or aggressive bone lesion. Cord:  Negative Paraspinal and other soft tissues: Negative no additional findings relative to prior PET-CT. Disc levels: T7-8 right paracentral protrusion without neural compression. IMPRESSION: Indeterminate T12 signal abnormality correlating with the prior PET-CT. Proximity to the disc and peripheral fatty marrow conversion favors a Schmorl's node/focal endplate degeneration rather than metastasis, but recommend attention on follow-up. Electronically Signed   By: JMonte FantasiaM.D.   On: 07/29/2020 06:46   MR BREAST BILATERAL W WO CONTRAST INC CAD  Result Date: 07/07/2020 CLINICAL DATA:  35year old female with diagnosis of right breast cancer which was originally found on a chest CT performed 06/24/2020. She has family history of breast cancer in her mother and maternal grandmother. She had 2 ultrasound-guided biopsies of masses in the medial right breast, both demonstrating invasive ductal carcinoma. She also had a right axillary mass biopsied which showed invasive ductal carcinoma with no residual nodal tissue, which could represent an additional primary focus or an entirely replaced metastatic lymph node. A biopsy of  the left breast was benign. She presents for staging MRI prior to initiating neoadjuvant chemotherapy. LABS:  None. EXAM: BILATERAL BREAST MRI WITH AND WITHOUT CONTRAST TECHNIQUE: Multiplanar, multisequence MR images of both breasts were obtained prior to and following the intravenous administration of 20 mL of MultiHance Three-dimensional MR images were rendered by post-processing of the original MR data on an independent workstation. The three-dimensional MR images were interpreted, and findings are reported in the following complete MRI report for this study. Three dimensional images were evaluated at the independent interpreting workstation using the DynaCAD thin client. COMPARISON:  No prior MRI available for comparison. Correlation made with prior mammogram and ultrasound images. FINDINGS: Breast composition: c. Heterogeneous fibroglandular tissue. Background parenchymal enhancement: Mild. Right breast: In the slightly lower inner quadrant of the right breast, middle depth, there is a round enhancing mass measuring 1.6 cm (series 8, image 65). Susceptibility artifact from the biopsy marking clip is seen along the medial aspect of the mass. Superior and medial to the mass, there is an area of non mass enhancement (series 8, image 62). This corresponds with the asymmetry and calcifications identified mammographically. The mass and non mass enhancement together measure approximately 5.4 x 3.0 x 4.0 cm. Susceptibility artifact is seen within the region of non mass enhancement, corresponding to the second site of biopsy demonstrating malignancy. Left breast: No mass or abnormal enhancement. Susceptibility artifact from the benign biopsy is identified in the lower outer quadrant of the left breast. Lymph nodes: There are numerous asymmetrically enlarged right axillary lymph nodes (at least 8). There is one 4 mm internal mammary lymph node (series 7, image 49). Ancillary findings:  None. IMPRESSION: 1. There is an  enhancing mass with adjacent non mass enhancement in the lower-inner right breast, which together measure 5.4 cm. The mass and non mass enhancement correspond with the 2 biopsy-proven areas of malignancy. 2. No other suspicious enhancement is identified  in the right breast. 3. There is one 4 mm right internal mammary lymph node with unclear significance as there are no prior MRIs to compare for stability. 4.  Numerous abnormal right axillary lymph nodes. 5.  No evidence of left breast malignancy. RECOMMENDATION: 1. Review of the surgical notes indicates that the patient is currently planning neoadjuvant chemotherapy followed by mastectomy. If this remains the case, no additional biopsies are necessary. However, if the patient decides to pursue breast conservation, then additional biopsies as described in the initial diagnostic report would be recommended. If those are benign, and breast conservation is desired, than MRI biopsy of distant areas of non mass enhancement in the medial right breast would be recommended to denote extent of disease prior to initiation of neoadjuvant chemotherapy. BI-RADS CATEGORY  6: Known biopsy-proven malignancy. Electronically Signed   By: Ammie Ferrier M.D.   On: 07/07/2020 14:32   NM PET Image Initial (PI) Skull Base To Thigh  Result Date: 07/22/2020 CLINICAL DATA:  Initial treatment strategy for right breast carcinoma. EXAM: NUCLEAR MEDICINE PET SKULL BASE TO THIGH TECHNIQUE: 8.2 mCi F-18 FDG was injected intravenously. Full-ring PET imaging was performed from the skull base to thigh after the radiotracer. CT data was obtained and used for attenuation correction and anatomic localization. Fasting blood glucose: 103 mg/dl COMPARISON:  Neck and chest CT on 06/24/2020 FINDINGS: Mediastinal blood-pool activity (background): SUV max = 2.2 Liver activity (reference): SUV max = N/A NECK: 2 sub-cm right supraclavicular lymph nodes show hypermetabolic activity, with highest SUV max  measuring 5.8. Incidental CT findings:  None. CHEST: 1.5 cm hypermetabolic soft tissue mass is seen in the medial right breast, which has SUV max of 8.9 and is consistent with the known primary breast carcinoma. Mild right subpectoral lymphadenopathy is seen, with index lymph node measuring 7 mm on image 57/4 with SUV max of 7.8. Mild left axillary lymphadenopathy is seen with index lymph node measuring 8 mm on image 60/4, with SUV max of 7.3. Sub-cm hypermetabolic lymph nodes are also seen in the right hilum with SUV max of 5.3, the subcarinal region with SUV max of 5.8, and the lateral aortic region with SUV max of 3.7. No suspicious pulmonary nodules seen on CT images. Incidental CT findings:  None. ABDOMEN/PELVIS: No abnormal hypermetabolic activity within the liver, pancreas, adrenal glands, or spleen. No hypermetabolic lymph nodes in the abdomen or pelvis. Incidental CT findings:  IUD noted in appropriate position. SKELETON: Focal site of hypermetabolism is seen in the left posterior T12 vertebral body, which has SUV max of 5.1. No corresponding lesion is seen on CT images, however this is consistent with a bone metastasis. Incidental CT findings:  None. IMPRESSION: 1.5 cm hypermetabolic mass in medial right breast, consistent with known primary carcinoma. Mild hypermetabolic lymphadenopathy in right supraclavicular, subpectoral, axillary, and hilar regions, and mediastinum, consistent with metastatic disease. Focal hypermetabolism in the T12 vertebral body, consistent with bone metastasis. No evidence of metastatic disease within the abdomen or pelvis. Electronically Signed   By: Marlaine Hind M.D.   On: 07/22/2020 16:31   ECHOCARDIOGRAM COMPLETE  Result Date: 07/23/2020    ECHOCARDIOGRAM REPORT   Patient Name:   MASSA PE Date of Exam: 07/22/2020 Medical Rec #:  [id number]    Height:       66.0 in Accession #:    [id number]   Weight:       160.0 lb Date of Birth:  Aug 16, 1985    BSA:  1.819 m  Patient Age:    34 years     BP:           108/70 mmHg Patient Gender: F            HR:           92 bpm. Exam Location:  Outpatient Procedure: 2D Echo, 3D Echo, Color Doppler, Limited Color Doppler and Strain            Analysis Indications:   Chemo Z09  History:       Patient has no prior history of Echocardiogram examinations.                Cancer.  Sonographer:   Darlina Sicilian RDCS Referring      [place] Phys: IMPRESSIONS  1. Left ventricular ejection fraction, by estimation, is 60 to 65%. The left ventricle has normal function. The left ventricle has no regional wall motion abnormalities. Left ventricular diastolic parameters were normal. The average left ventricular global longitudinal strain is -20.8 %.  2. Right ventricular systolic function is normal. The right ventricular size is normal. Tricuspid regurgitation signal is inadequate for assessing PA pressure.  3. The mitral valve is normal in structure. No evidence of mitral valve regurgitation.  4. The aortic valve was not well visualized. Aortic valve regurgitation is not visualized. No aortic stenosis is present.  5. The inferior vena cava is normal in size with greater than 50% respiratory variability, suggesting right atrial pressure of 3 mmHg. FINDINGS  Left Ventricle: Left ventricular ejection fraction, by estimation, is 60 to 65%. The left ventricle has normal function. The left ventricle has no regional wall motion abnormalities. The average left ventricular global longitudinal strain is -20.8 %. The left ventricular internal cavity size was normal in size. There is no left ventricular hypertrophy. Left ventricular diastolic parameters were normal. Right Ventricle: The right ventricular size is normal. No increase in right ventricular wall thickness. Right ventricular systolic function is normal. Tricuspid regurgitation signal is inadequate for assessing PA pressure. Left Atrium: Left atrial size was normal in size. Right Atrium:  Right atrial size was normal in size. Pericardium: There is no evidence of pericardial effusion. Mitral Valve: The mitral valve is normal in structure. No evidence of mitral valve regurgitation. Tricuspid Valve: The tricuspid valve is normal in structure. Tricuspid valve regurgitation is not demonstrated. Aortic Valve: The aortic valve was not well visualized. Aortic valve regurgitation is not visualized. No aortic stenosis is present. Pulmonic Valve: The pulmonic valve was not well visualized. Pulmonic valve regurgitation is not visualized. Aorta: The aortic root and ascending aorta are structurally normal, with no evidence of dilitation. Venous: The inferior vena cava is normal in size with greater than 50% respiratory variability, suggesting right atrial pressure of 3 mmHg. IAS/Shunts: The interatrial septum was not well visualized.  LEFT VENTRICLE PLAX 2D LVIDd:         4.90 cm  Diastology LVIDs:         3.30 cm  LV e' medial:    11.10 cm/s LV PW:         0.70 cm  LV E/e' medial:  6.4 LV IVS:        0.70 cm  LV e' lateral:   12.90 cm/s LVOT diam:     2.00 cm  LV E/e' lateral: 5.5 LV SV:         70 LV SV Index:   38  2D Longitudinal Strain LVOT Area:     3.14 cm 2D Strain GLS Avg:     -20.8 %  RIGHT VENTRICLE RV S prime:     11.60 cm/s TAPSE (M-mode): 1.8 cm LEFT ATRIUM             Index       RIGHT ATRIUM          Index LA diam:        3.10 cm 1.70 cm/m  RA Area:     9.18 cm LA Vol (A2C):   22.8 ml 12.54 ml/m RA Volume:   17.70 ml 9.73 ml/m LA Vol (A4C):   25.7 ml 14.13 ml/m LA Biplane Vol: 25.5 ml 14.02 ml/m  AORTIC VALVE LVOT Vmax:   102.00 cm/s LVOT Vmean:  70.800 cm/s LVOT VTI:    0.222 m  AORTA Ao Root diam: 3.10 cm Ao Asc diam:  2.40 cm MITRAL VALVE MV Area (PHT): 3.99 cm    SHUNTS MV Decel Time: 190 msec    Systemic VTI:  0.22 m MV E velocity: 70.70 cm/s  Systemic Diam: 2.00 cm MV A velocity: 60.00 cm/s MV E/A ratio:  1.18 Oswaldo Milian MD Electronically signed by Oswaldo Milian  MD Signature Date/Time: 07/23/2020/9:16:49 AM    Final    IR IMAGING GUIDED PORT INSERTION  Result Date: 07/22/2020 INDICATION: History of metastatic right-sided breast cancer. In need of durable intravenous access for chemotherapy administration. EXAM: IMPLANTED PORT A CATH PLACEMENT WITH ULTRASOUND AND FLUOROSCOPIC GUIDANCE COMPARISON:  PET-CT-07/21/2020 MEDICATIONS: None ANESTHESIA/SEDATION: Moderate (conscious) sedation was employed during this procedure. A total of Versed 2.5 mg and Fentanyl 125 mcg was administered intravenously. Moderate Sedation Time: 32 minutes. The patient's level of consciousness and vital signs were monitored continuously by radiology nursing throughout the procedure under my direct supervision. CONTRAST:  None FLUOROSCOPY TIME:  1 minute, 6 seconds (4 mGy) COMPLICATIONS: None immediate. PROCEDURE: The procedure, risks, benefits, and alternatives were explained to the patient. Questions regarding the procedure were encouraged and answered. The patient understands and consents to the procedure. Given history of right-sided breast cancer decision was made to place a left internal jugular approach port a catheter. The left neck and chest were prepped with chlorhexidine in a sterile fashion, and a sterile drape was applied covering the operative field. Maximum barrier sterile technique with sterile gowns and gloves were used for the procedure. A timeout was performed prior to the initiation of the procedure. Local anesthesia was provided with 1% lidocaine with epinephrine. After creating a small venotomy incision, a micropuncture kit was utilized to access the internal jugular vein. Real-time ultrasound guidance was utilized for vascular access including the acquisition of a permanent ultrasound image documenting patency of the accessed vessel. The microwire was utilized to measure appropriate catheter length. A subcutaneous port pocket was then created along the upper chest wall  utilizing a combination of sharp and blunt dissection. The pocket was irrigated with sterile saline. A single lumen "ISP" sized power injectable port was chosen for placement. The 8 Fr catheter was tunneled from the port pocket site to the venotomy incision. The port was placed in the pocket. The external catheter was trimmed to appropriate length. At the venotomy, an 8 Fr peel-away sheath was placed over a guidewire under fluoroscopic guidance. The catheter was then placed through the sheath and the sheath was removed. Final catheter positioning was confirmed and documented with a fluoroscopic spot radiograph. The port was accessed with a Huber needle, aspirated  and flushed with heparinized saline. The venotomy site was closed with an interrupted 4-0 Vicryl suture. The port pocket incision was closed with interrupted 2-0 Vicryl suture. The skin was opposed with a running subcuticular 4-0 Vicryl suture. Dermabond and Steri-strips were applied to both incisions. Dressings were applied. The patient tolerated the procedure well without immediate post procedural complication. FINDINGS: After catheter placement, the tip lies within the superior cavoatrial junction. The catheter aspirates and flushes normally and is ready for immediate use. IMPRESSION: Successful placement of a left internal jugular approach power injectable Port-A-Cath. The catheter is ready for immediate use. Electronically Signed   By: Sandi Mariscal M.D.   On: 07/22/2020 11:15      Discharge Exam: Vitals:   07/31/20 2135 08/01/20 0518  BP: 128/80 123/85  Pulse: 65 95  Resp: 14 16  Temp: 98 F (36.7 C) 97.9 F (36.6 C)  SpO2: 100% 97%   Vitals:   07/31/20 1611 07/31/20 1728 07/31/20 2135 08/01/20 0518  BP: 115/73 116/79 128/80 123/85  Pulse: 76 72 65 95  Resp: _0 Temp: 98.3 F (36.8 C) 97.8 F (36.6 C) 98 F (36.7 C) 97.9 F (36.6 C)  TempSrc: Oral Oral Oral Oral  SpO2: 99% 100% 100% 97%  Weight:      Height:         General: Pt is alert, awake, not in acute distress Cardiovascular: RRR, S1/S2 +, no rubs, no gallops Respiratory: CTA bilaterally, no wheezing, no rhonchi Abdominal: Soft, NT, ND, bowel sounds + Extremities: no edema, no cyanosis    The results of significant diagnostics from this hospitalization (including imaging, microbiology, ancillary and laboratory) are listed below for reference.     Microbiology: Recent Results (from the past 240 hour(s))  Resp Panel by RT-PCR (Flu A&B, Covid) Nasopharyngeal Swab     Status: None   Collection Time: 07/30/20 10:37 PM   Specimen: Nasopharyngeal Swab; Nasopharyngeal(NP) swabs in vial transport medium  Result Value Ref Range Status   SARS Coronavirus 2 by RT PCR NEGATIVE NEGATIVE Final    Comment: (NOTE) SARS-CoV-2 target nucleic acids are NOT DETECTED.  The SARS-CoV-2 RNA is generally detectable in upper respiratory specimens during the acute phase of infection. The lowest concentration of SARS-CoV-2 viral copies this assay can detect is 138 copies/mL. A negative result does not preclude SARS-Cov-2 infection and should not be used as the sole basis for treatment or other patient management decisions. A negative result may occur with  improper specimen collection/handling, submission of specimen other than nasopharyngeal swab, presence of viral mutation(s) within the areas targeted by this assay, and inadequate number of viral copies(<138 copies/mL). A negative result must be combined with clinical observations, patient history, and epidemiological information. The expected result is Negative.  Fact Sheet for Patients:  EntrepreneurPulse.com.au  Fact Sheet for Healthcare Providers:  IncredibleEmployment.be  This test is no t yet approved or cleared by the [place] FDA and  has been authorized for detection and/or diagnosis of SARS-CoV-2 by FDA under an Emergency Use Authorization (EUA). This  EUA will remain  in effect (meaning this test can be used) for the duration of the COVID-19 declaration under Section 564(b)(1) of the Act, 21 U.S.C.section 360bbb-3(b)(1), unless the authorization is terminated  or revoked sooner.       Influenza A by PCR NEGATIVE NEGATIVE Final   Influenza B by PCR NEGATIVE NEGATIVE Final    Comment: (NOTE) The Xpert Xpress SARS-CoV-2/FLU/RSV plus assay is intended as  an aid in the diagnosis of influenza from Nasopharyngeal swab specimens and should not be used as a sole basis for treatment. Nasal washings and aspirates are unacceptable for Xpert Xpress SARS-CoV-2/FLU/RSV testing.  Fact Sheet for Patients: EntrepreneurPulse.com.au  Fact Sheet for Healthcare Providers: IncredibleEmployment.be  This test is not yet approved or cleared by the [place] FDA and has been authorized for detection and/or diagnosis of SARS-CoV-2 by FDA under an Emergency Use Authorization (EUA). This EUA will remain in effect (meaning this test can be used) for the duration of the COVID-19 declaration under Section 564(b)(1) of the Act, 21 U.S.C. section 360bbb-3(b)(1), unless the authorization is terminated or revoked.  Performed at [facility], [place] [address], [place], [place] [address]      Labs: BNP (last 3 results) No results for input(s): BNP in the last 8760 hours. Basic Metabolic Panel: Recent Labs  Lab 07/30/20 1157 07/30/20 2040 07/31/20 0559 08/01/20 0543  NA 136 131* 133* 135  K 3.7 3.1* 3.3* 3.3*  CL 98 97* 104 105  CO2 _0 GLUCOSE 92 101* 98 104*  BUN _1 <5*  CREATININE 0.93 0.91 0.82 0.73  CALCIUM 7.2* 6.4* 6.1* 7.4*  MG  --  1.8 2.4  --   PHOS  --  2.8 3.1  --    Liver Function Tests: Recent Labs  Lab 07/30/20 1157 07/30/20 2040 07/31/20 0559  AST 41 36 34  ALT 139* 110* 94*  ALKPHOS 65 63 56  BILITOT 0.4 0.3 0.3  PROT 7.5 7.1 6.3*  ALBUMIN 4.1 4.1 3.7    No results for input(s): LIPASE, AMYLASE in the last 168 hours. No results for input(s): AMMONIA in the last 168 hours. CBC: Recent Labs  Lab 07/30/20 1157 07/30/20 2040 07/31/20 0559  WBC 10.0 16.2* 19.5*  NEUTROABS 5.3 9.6* 11.9*  HGB 15.4* 14.6 14.2  HCT 45.6 42.2 42.4  MCV 99.1 98.1 99.8  PLT 252 257 274   Cardiac Enzymes: Recent Labs  Lab 07/31/20 0558  CKTOTAL 68   BNP: Invalid input(s): POCBNP CBG: No results for input(s): GLUCAP in the last 168 hours. D-Dimer No results for input(s): DDIMER in the last 72 hours. Hgb A1c No results for input(s): HGBA1C in the last 72 hours. Lipid Profile No results for input(s): CHOL, HDL, LDLCALC, TRIG, CHOLHDL, LDLDIRECT in the last 72 hours. Thyroid function studies Recent Labs    07/31/20 0559 07/31/20 0850  TSH 45.098*  --   T3FREE  --  1.5*   Anemia work up No results for input(s): VITAMINB12, FOLATE, FERRITIN, TIBC, IRON, RETICCTPCT in the last 72 hours. Urinalysis    Component Value Date/Time   COLORURINE YELLOW 07/31/2020 0558   APPEARANCEUR CLEAR 07/31/2020 0558   LABSPEC 1.005 07/31/2020 0558   PHURINE 6.0 07/31/2020 0558   GLUCOSEU NEGATIVE 07/31/2020 0558   HGBUR NEGATIVE 07/31/2020 0558   BILIRUBINUR NEGATIVE 07/31/2020 0558   BILIRUBINUR small (A) 02/16/2017 1216   BILIRUBINUR small 12/04/2012 1615   KETONESUR NEGATIVE 07/31/2020 0558   PROTEINUR NEGATIVE 07/31/2020 0558   UROBILINOGEN 0.2 02/16/2017 1216   UROBILINOGEN 0.2 09/18/2013 1555   NITRITE NEGATIVE 07/31/2020 0558   LEUKOCYTESUR NEGATIVE 07/31/2020 0558   Sepsis Labs Invalid input(s): PROCALCITONIN,  WBC,  LACTICIDVEN Microbiology Recent Results (from the past 240 hour(s))  Resp Panel by RT-PCR (Flu A&B, Covid) Nasopharyngeal Swab     Status: None   Collection Time: 07/30/20 10:37 PM   Specimen: Nasopharyngeal Swab;  Nasopharyngeal(NP) swabs in vial transport medium  Result Value Ref Range Status   SARS Coronavirus 2 by RT PCR  NEGATIVE NEGATIVE Final    Comment: (NOTE) SARS-CoV-2 target nucleic acids are NOT DETECTED.  The SARS-CoV-2 RNA is generally detectable in upper respiratory specimens during the acute phase of infection. The lowest concentration of SARS-CoV-2 viral copies this assay can detect is 138 copies/mL. A negative result does not preclude SARS-Cov-2 infection and should not be used as the sole basis for treatment or other patient management decisions. A negative result may occur with  improper specimen collection/handling, submission of specimen other than nasopharyngeal swab, presence of viral mutation(s) within the areas targeted by this assay, and inadequate number of viral copies(<138 copies/mL). A negative result must be combined with clinical observations, patient history, and epidemiological information. The expected result is Negative.  Fact Sheet for Patients:  EntrepreneurPulse.com.au  Fact Sheet for Healthcare Providers:  IncredibleEmployment.be  This test is no t yet approved or cleared by the [place] FDA and  has been authorized for detection and/or diagnosis of SARS-CoV-2 by FDA under an Emergency Use Authorization (EUA). This EUA will remain  in effect (meaning this test can be used) for the duration of the COVID-19 declaration under Section 564(b)(1) of the Act, 21 U.S.C.section 360bbb-3(b)(1), unless the authorization is terminated  or revoked sooner.       Influenza A by PCR NEGATIVE NEGATIVE Final   Influenza B by PCR NEGATIVE NEGATIVE Final    Comment: (NOTE) The Xpert Xpress SARS-CoV-2/FLU/RSV plus assay is intended as an aid in the diagnosis of influenza from Nasopharyngeal swab specimens and should not be used as a sole basis for treatment. Nasal washings and aspirates are unacceptable for Xpert Xpress SARS-CoV-2/FLU/RSV testing.  Fact Sheet for Patients: EntrepreneurPulse.com.au  Fact Sheet for  Healthcare Providers: IncredibleEmployment.be  This test is not yet approved or cleared by the [place] FDA and has been authorized for detection and/or diagnosis of SARS-CoV-2 by FDA under an Emergency Use Authorization (EUA). This EUA will remain in effect (meaning this test can be used) for the duration of the COVID-19 declaration under Section 564(b)(1) of the Act, 21 U.S.C. section 360bbb-3(b)(1), unless the authorization is terminated or revoked.  Performed at [facility], [place] [address], [place], [place] [address]      Time coordinating discharge: Over 30 minutes  SIGNED:   Darliss Cheney, MD  [facility] 08/01/2020, 9:28 AM  If 7PM-7AM, please contact night-coverage [email or web address]

## 2020-08-02 LAB — CALCIUM, IONIZED
Calcium, Ionized, Serum: 3.5 mg/dL — ABNORMAL LOW (ref 4.5–5.6)
Calcium, Ionized, Serum: 3.7 mg/dL — ABNORMAL LOW (ref 4.5–5.6)
Calcium, Ionized, Serum: 4.2 mg/dL — ABNORMAL LOW (ref 4.5–5.6)
Calcium, Ionized, Serum: 4.3 mg/dL — ABNORMAL LOW (ref 4.5–5.6)

## 2020-08-02 LAB — CALCITONIN: Calcitonin: <2 pg/mL (ref 0.0–5.0)

## 2020-08-04 ENCOUNTER — Other Ambulatory Visit: Payer: Self-pay | Admitting: Oncology

## 2020-08-04 DIAGNOSIS — E3122 Multiple endocrine neoplasia [MEN] type IIA: Secondary | ICD-10-CM

## 2020-08-06 ENCOUNTER — Other Ambulatory Visit: Payer: Self-pay | Admitting: Oncology

## 2020-08-06 ENCOUNTER — Telehealth: Payer: Self-pay | Admitting: Adult Health

## 2020-08-06 NOTE — Progress Notes (Signed)
Kayla Little did not tolerate doxycycline. If her rosacea persists (as seems likely) she may benefit from metrogel.  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 She has been started on calcitriol and her calcium should have normalized. We will continue to follow this.  Just in case she has nausea/vomiting issues again I have added IVF for 6/11, when she returns for her PEGfilgrastim.

## 2020-08-06 NOTE — Telephone Encounter (Signed)
Scheduled appt per 6/1 sch msg. Called pt, no answer. Left msg with appt date and time.  

## 2020-08-13 ENCOUNTER — Inpatient Hospital Stay: Payer: 59 | Attending: Oncology | Admitting: Hematology and Oncology

## 2020-08-13 ENCOUNTER — Inpatient Hospital Stay: Payer: 59 | Admitting: Physician Assistant

## 2020-08-13 ENCOUNTER — Encounter: Payer: Self-pay | Admitting: Hematology and Oncology

## 2020-08-13 ENCOUNTER — Other Ambulatory Visit: Payer: Self-pay

## 2020-08-13 VITALS — BP 118/87 | HR 86 | Temp 98.4°F | Resp 18 | Wt 160.5 lb

## 2020-08-13 DIAGNOSIS — Z5189 Encounter for other specified aftercare: Secondary | ICD-10-CM | POA: Diagnosis not present

## 2020-08-13 DIAGNOSIS — C50211 Malignant neoplasm of upper-inner quadrant of right female breast: Secondary | ICD-10-CM | POA: Insufficient documentation

## 2020-08-13 DIAGNOSIS — Z79899 Other long term (current) drug therapy: Secondary | ICD-10-CM | POA: Diagnosis not present

## 2020-08-13 DIAGNOSIS — F17210 Nicotine dependence, cigarettes, uncomplicated: Secondary | ICD-10-CM | POA: Insufficient documentation

## 2020-08-13 DIAGNOSIS — E3122 Multiple endocrine neoplasia [MEN] type IIA: Secondary | ICD-10-CM | POA: Diagnosis not present

## 2020-08-13 DIAGNOSIS — Z5112 Encounter for antineoplastic immunotherapy: Secondary | ICD-10-CM | POA: Diagnosis not present

## 2020-08-13 DIAGNOSIS — Z170 Estrogen receptor positive status [ER+]: Secondary | ICD-10-CM | POA: Insufficient documentation

## 2020-08-13 DIAGNOSIS — Z5111 Encounter for antineoplastic chemotherapy: Secondary | ICD-10-CM | POA: Diagnosis present

## 2020-08-13 DIAGNOSIS — C50919 Malignant neoplasm of unspecified site of unspecified female breast: Secondary | ICD-10-CM

## 2020-08-13 DIAGNOSIS — Z85850 Personal history of malignant neoplasm of thyroid: Secondary | ICD-10-CM | POA: Diagnosis not present

## 2020-08-13 MED ORDER — PROCHLORPERAZINE MALEATE 10 MG PO TABS: 10.0000 mg | ORAL_TABLET | Freq: Four times a day (QID) | ORAL | 1 refills | Status: DC | PRN

## 2020-08-13 NOTE — Progress Notes (Signed)
Kayla Little  [phone] [phone]     ID: Kayla Little DOB: 1985/05/16  MR#: [id number]  TAV#:[id number]  Patient Care Team: [name] [place] as PCP - General (Family Medicine) Magrinat, Virgie Dad, MD as Consulting Physician (Oncology) Kyung Rudd, MD as Consulting Physician (Radiation Oncology) Armandina Gemma, MD as Consulting Physician (General Surgery) Rockwell Germany, RN as Registered Nurse Mauro Kaufmann, RN as Registered Nurse Orson Slick, MD OTHER MD:  CHIEF COMPLAINT: Triple positive locally advanced breast cancer  CURRENT TREATMENT: Neoadjuvant chemotherapy   INTERVAL HISTORY: Kayla Little returns today for follow up and treatment of her locally advanced breast cancer. She was evaluated in the breast cancer clinic on 07/31/2020.  She completed cycle 1 of chemotherapy but had marked amounts of nausea and vomiting secondary to the [place] therapy.  She also developed an active form rash which has subsequently resolved.  She received her first cycle of chemotherapy 07/23/2020.  Tomorrow is Cycle 2 Day 1.    REVIEW OF SYSTEMS: Kayla Little did generally well with her first cycle but she did have significant diarrhea.  This is due to the [place].  She had more than 6 loose bowel movements on day 6 for example.  Once she got Imodium that improved but she still having some loose bowel movements even now.  She has pain in her mouth but no mouth sores as such.  She is using Biotene.  She has a feeling like she does when her calcium is low she says and indeed her calcium was low today.  She has very minimal nausea and she found that Compazine and Zofran both worked as far as that is concerned.  She was able to keep herself hydrated without too much difficulty and she explored with various fruits and other simple foods when she was having some GI issues from the chemotherapy.  She does not have periods recall that she has an IUD in place  but she has some cramps the evening of day 1 which felt menstrual to her.  For couple of days she felt slight urethral irritability like she might have a urinary infection.  She never had any temperature.  Port worked well.  Detailed review of systems was otherwise noncontributory   COVID 19 VACCINATION STATUS: Status post Moderna x2 followed by Coca-[employer]a booster   HISTORY OF CURRENT ILLNESS: From the original intake note:  Kayla Little "Kayla Little" (the daughter of my patient, Kayla Little, whom I saw earlier today). has a history of medullary thyroid cancer, status post total thyroidectomy on 04/12/2019 under Dr. Harlow Asa.   More recently she presented to her GYN with a right supraclavicular lump, prompting neck and chest CT on 06/24/2020. Neck CT showed: no recurrent mass in thyroid bed, palpable abnormality corresponds to a cluster of 2 lymph nodes in right supraclavicular region; prominent lym ph nodes in neck bilaterally. Chest CT showed: enhancing 1.4 cm lesion in medial right breast with associated mildly enlarged right axillary lymph nodes; 1.2 cm lesion in left hepatic lobe with central hyperattenuation; enlargement of right infrahilar and right hilar nodal tissue is borderline suspicious; sclerotic lesion with spiculated margins at T7 level favored to represent a bone island.  She underwent bilateral diagnostic mammography with tomography and bilateral breast ultrasonography at [facility] on 06/27/2020 showing: breast density category D; Right Breast 1. 2.6 cm irregular mass in inner breast, with associated microcalcifications spanning up to 5.1 cm, corresponding to chest CT finding. 2.  Adjacent 0.9 cm mass in inner breast, 2 cm away from dominant mass. 3. Two indeterminate masses in outer breast. 4. At least 3 enlarged lymph nodes in right axilla  Left Breast 1. Indeterminate 1.2 cm mass in lower-outer breast, possibly a fibroadenoma 2. No enlarged lymphadenopathy in left axilla.  Accordingly on  06/30/2020 she proceeded to biopsy of the right breast area in question. The pathology from this procedure ([id number]) showed:  1. Dominant mass  - invasive ductal carcinoma, grade 1  - ductal carcinoma in situ  - Prognostic indicators significant for: estrogen receptor, 90% positive and progesterone receptor, 80% positive, both with moderate staining intensity.  HER2 equivocal by immunohistochemistry (2+), but positive by FISH, with a signals ratio of 2.2 and the number per cell greater than 5.. 2. Smaller mass  - invasive ductal carcinoma, grade 1  - ductal carcinoma in situ  - Prognostic indicators significant for: estrogen receptor, 100% positive with strong staining intensity and progesterone receptor, 50% positive with moderate staining intensity.  HER2 positive by immunohistochemistry (3+). 3. Right axillary lymph node  - invasive ductal carcinoma, grade 1 Prognostic indicators significant for: estrogen receptor, 100% positive and progesterone receptor, 90% positive, both with strong staining intensity. HER2 equivocal by immunohistochemistry (2+), {but (positive or negative) by fluorescent in situ hybridization   Biopsy of the left breast area was performed the same day. Pathology from the procedure ([id number]) was benign.  She underwent breast MRI on 07/06/2020 showing: breast composition C; enhancing mass with adjacent non-mass enhancement in lower-inner right breast, which together measure 5.4 cm, corresponding with two biopsy-proven areas of malignancy; no other suspicious enhancement identified in right breast; one 4 mm right internal mammary lymph node with unclear significance; numerous abnormal right axillary lymph nodes; no evidence of left breast malignancy.  Cancer Staging Malignant neoplasm of upper-inner quadrant of right breast in female, estrogen receptor positive ([place]) Staging form: Breast, AJCC 8th Edition - Clinical stage from 07/08/2020: Stage IIIA (cT3, cN3, cM0, G1, ER+,  PR+, HER2+) - Unsigned Stage prefix: Initial diagnosis Histologic grading system: 3 grade system  The patient's subsequent history is as detailed below.   PAST MEDICAL HISTORY: Past Medical History:  Diagnosis Date  . ADD (attention deficit disorder)   . Breast cancer ([place]) 06/30/2020  . Breast lump    R breast  . Depression   . Family history of breast cancer   . Family history of lymphoma   . Family history of ovarian cancer   . Family history of prostate cancer   . 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 Hx of varicella   . Kidney stone 2012  . Medullary thyroid carcinoma ([place])   . Monoallelic mutation of CHEK2 gene in female patient   . Postpartum care following vaginal delivery (7/27) 10/02/2014, June 16 , 2020  . Thyroid cancer ([place]) 2021    PAST SURGICAL HISTORY: Past Surgical History:  Procedure Laterality Date  . IR IMAGING GUIDED PORT INSERTION  07/22/2020  . NO PAST SURGERIES    . THYROIDECTOMY N/A 04/12/2019   Procedure: TOTAL THYROIDECTOMY WITH LIMITED LYMPH NODE DISSECTION;  Surgeon: Armandina Gemma, MD;  Location: [facility];  Service: General;  Laterality: N/A;  . UNILATERAL SALPINGECTOMY Right 07/09/2017   Procedure: RIGHT SALPINGECTOMY WITH REMOVAL OF ECTOPIC PREGNANCY;  Surgeon: Azucena Fallen, MD;  Location: [facility];  Service: Gynecology;  Laterality: Right;    FAMILY HISTORY: Family History  Problem Relation Age of Onset  .  Breast cancer Mother 34       2 types of cancer one side mastectomy with chemo  . Endometriosis Mother   . Endometriosis Maternal Grandmother   . Breast cancer Maternal Grandmother        post menopausal  . Diabetes Maternal Grandmother   . Cancer Maternal Grandfather        prostate and lymphoma  . Alzheimer's disease Paternal Grandmother   . Cancer Father 79       prostate cancer  . Aneurysm Paternal Grandfather        d. 2s   Please see genetic counseling note dated 11/23/2018 for full family  information.   GYNECOLOGIC HISTORY:  No LMP recorded. (Menstrual status: IUD). Menarche: 35 years old Age at first live birth: 35 years old [place] P 2 Patient has a Mirena IUD in place HRT n/a  Hysterectomy? no BSO?  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  Her husband Barnabas Lister works for [employer].  Their children are Dylan 5 and Emma 2.  Barnabas Lister has a 76 year old daughter, the patient's stepdaughter Hildred Alamin, currently attending [employer].  The patient is a [employer]    ADVANCED DIRECTIVES: In the absence of any documents to the contrary the patient's husband is her healthcare power of attorney   HEALTH MAINTENANCE: Social History   Tobacco Use  . Smoking status: Current Some Day Smoker    Packs/day: 0.50    Years: 5.00    Pack years: 2.50    Types: Cigarettes  . Smokeless tobacco: Never Used  . Tobacco comment: Actively trying to quit.  Vaping Use  . Vaping Use: Never used  Substance Use Topics  . Alcohol use: Yes    Alcohol/week: 7.0 - 14.0 standard drinks    Types: 7 - 14 Glasses of wine per week    Comment: glass of wine each night   . Drug use: Not Currently    Types: Marijuana    Comment: not during pregnancy, last use was 03/27/2019     Colonoscopy: n/a (age)  PAP:   Bone density: n/a (age)   No Known Allergies  Current Outpatient Medications  Medication Sig Dispense Refill  . acetaminophen (TYLENOL) 325 MG tablet Take 2 tablets (650 mg total) by mouth every 4 (four) hours as needed (for pain scale < 4). 100 tablet 0  . calcitRIOL (ROCALTROL) 0.5 MCG capsule Take 1 capsule (0.5 mcg total) by mouth 2 (two) times a week. 30 capsule 1  . dexamethasone (DECADRON) 4 MG tablet Take 2 tablets (8 mg total) by mouth 2 (two) times daily. Start the day before Taxotere. Then take daily x 3 days after chemotherapy. 30 tablet 1  . doxycycline (VIBRA-TABS) 100 MG tablet Take  1 tablet (100 mg total) by mouth daily. 90 tablet 0  . levonorgestrel (MIRENA) 20 MCG/24HR IUD 1 each by Intrauterine route once.    Marland Kitchen levothyroxine (SYNTHROID) 175 MCG tablet Take 175 mcg by mouth daily before breakfast.    . lidocaine-prilocaine (EMLA) cream Apply to affected area once (Patient taking differently: Apply 1 application topically as needed (port access).) 30 g 3  . loperamide (IMODIUM) 2 MG capsule Take 2 mg by mouth as needed for diarrhea or loose stools.    Marland Kitchen loratadine (CLARITIN) 10 MG tablet Take 1 tablet (10 mg total) by mouth daily. 90 tablet 0  . ondansetron (ZOFRAN) 8 MG tablet Take  1 tablet (8 mg total) by mouth 2 (two) times daily as needed (Nausea or vomiting). Start on the third day after chemotherapy. 30 tablet 1  . prochlorperazine (COMPAZINE) 10 MG tablet Take 1 tablet (10 mg total) by mouth every 6 (six) hours as needed (Nausea or vomiting). 30 tablet 1  . sertraline (ZOLOFT) 50 MG tablet Take 50 mg by mouth at bedtime.    . valACYclovir (VALTREX) 500 MG tablet Take 1 tablet (500 mg total) by mouth daily. 90 tablet 0  . VYVANSE 40 MG capsule Take 40 mg by mouth every morning.    Marland Kitchen VYVANSE 60 MG capsule Take 60 mg by mouth every morning.     No current facility-administered medications for this visit.    OBJECTIVE: White woman who appears stated age  Vitals:   08/13/20 0852  BP: 118/87  Pulse: 86  Resp: 18  Temp: 98.4 F (36.9 C)  SpO2: 99%     Body mass index is 25.91 kg/m.   Wt Readings from Last 3 Encounters:  08/13/20 160 lb 8 oz (72.8 kg)  07/30/20 159 lb (72.1 kg)  07/30/20 159 lb 14.4 oz (72.5 kg)      ECOG FS:1 - Symptomatic but completely ambulatory  Sclerae unicteric, EOMs intact Wearing a mask The right cervical/supraclavicular node appears a bit flatter and smaller. Lungs no rales or rhonchi Heart regular rate and rhythm Abd soft, nontender, positive bowel sounds MSK no focal spinal tenderness, no upper extremity lymphedema Neuro:  nonfocal, well oriented, appropriate affect Breasts: I do not palpate a mass in the right breast or right axilla.   LAB RESULTS:  CMP     Component Value Date/Time   NA 135 08/01/2020 0543   NA 142 02/16/2017 1332   K 3.3 (L) 08/01/2020 0543   CL 105 08/01/2020 0543   CO2 23 08/01/2020 0543   GLUCOSE 104 (H) 08/01/2020 0543   BUN <5 (L) 08/01/2020 0543   BUN 9 02/16/2017 1332   CREATININE 0.73 08/01/2020 0543   CREATININE 0.91 07/08/2020 1400   CREATININE 0.82 12/04/2012 1732   CALCIUM 7.4 (L) 08/01/2020 0543   PROT 6.3 (L) 07/31/2020 0559   PROT 6.5 02/16/2017 1332   ALBUMIN 3.7 07/31/2020 0559   ALBUMIN 4.3 02/16/2017 1332   AST 34 07/31/2020 0559   AST 27 07/08/2020 1400   ALT 94 (H) 07/31/2020 0559   ALT 28 07/08/2020 1400   ALKPHOS 56 07/31/2020 0559   BILITOT 0.3 07/31/2020 0559   BILITOT 0.2 (L) 07/08/2020 1400   GFRNONAA >60 08/01/2020 0543   GFRNONAA >60 07/08/2020 1400   GFRAA >60 04/15/2019 2145    No results found for: TOTALPROTELP, ALBUMINELP, A1GS, A2GS, BETS, BETA2SER, GAMS, MSPIKE, SPEI  Lab Results  Component Value Date   WBC 19.5 (H) 07/31/2020   NEUTROABS 11.9 (H) 07/31/2020   HGB 14.2 07/31/2020   HCT 42.4 07/31/2020   MCV 99.8 07/31/2020   PLT 274 07/31/2020    No results found for: LABCA2  No components found for: NGEXBM841  No results for input(s): INR in the last 168 hours.  No results found for: LABCA2  No results found for: LKG401  No results found for: CAN125  No results found for: UUV253  Lab Results  Component Value Date   CA2729 15.9 07/30/2020    No components found for: HGQUANT  Lab Results  Component Value Date   CEA1 5.17 (H) 07/30/2020   /  CEA ([place])  Date Value  Ref Range Status  07/30/2020 5.17 (H) 0.00 - 5.00 ng/mL Final    Comment:    (NOTE) This test was performed using Architect's Chemiluminescent Microparticle Immunoassay. Values obtained from different assay methods cannot be used  interchangeably. Please note that 5-10% of patients who smoke may see CEA levels up to 6.9 ng/mL. Performed at [facility], [place] [address], [place], [place] [address]      No results found for: AFPTUMOR  No results found for: CHROMOGRNA  No results found for: KPAFRELGTCHN, LAMBDASER, KAPLAMBRATIO (kappa/lambda light chains)  No results found for: HGBA, HGBA2QUANT, HGBFQUANT, HGBSQUAN (Hemoglobinopathy evaluation)   No results found for: LDH  No results found for: IRON, TIBC, IRONPCTSAT (Iron and TIBC)  No results found for: FERRITIN  Urinalysis    Component Value Date/Time   COLORURINE YELLOW 07/31/2020 [place] 07/31/2020 0558   LABSPEC 1.005 07/31/2020 [place] 6.0 07/31/2020 [place] 07/31/2020 0558   HGBUR NEGATIVE 07/31/2020 0558   BILIRUBINUR NEGATIVE 07/31/2020 0558   BILIRUBINUR small (A) 02/16/2017 1216   BILIRUBINUR small 12/04/2012 [place] 07/31/2020 0558   PROTEINUR NEGATIVE 07/31/2020 0558   UROBILINOGEN 0.2 02/16/2017 1216   UROBILINOGEN 0.2 09/18/2013 1555   NITRITE NEGATIVE 07/31/2020 0558   LEUKOCYTESUR NEGATIVE 07/31/2020 0558    STUDIES: MR THORACIC SPINE W WO CONTRAST  Result Date: 07/29/2020 CLINICAL DATA:  Breast cancer EXAM: MRI THORACIC WITHOUT AND WITH CONTRAST TECHNIQUE: Multiplanar and multiecho pulse sequences of the thoracic spine were obtained without and with intravenous contrast. CONTRAST:  41m GADAVIST GADOBUTROL 1 MMOL/ML IV SOLN COMPARISON:  Head CT 07/21/2020 FINDINGS: Alignment:  Normal Vertebrae: Approximately 1 cm T1 and T2 hypointensity along the left upper aspect of the T12 vertebral body, adjacent to the disc space. Location adjacent to disc and a peripheral fatty marrow signal with superimposed edematous signal favors a discogenic/degenerative cause. T7 body bone island.  T10 hemangioma. No fracture, discitis, or aggressive bone lesion. Cord:   Negative Paraspinal and other soft tissues: Negative no additional findings relative to prior PET-CT. Disc levels: T7-8 right paracentral protrusion without neural compression. IMPRESSION: Indeterminate T12 signal abnormality correlating with the prior PET-CT. Proximity to the disc and peripheral fatty marrow conversion favors a Schmorl's node/focal endplate degeneration rather than metastasis, but recommend attention on follow-up. Electronically Signed   By: JMonte FantasiaM.D.   On: 07/29/2020 06:46   NM PET Image Initial (PI) Skull Base To Thigh  Result Date: 07/22/2020 CLINICAL DATA:  Initial treatment strategy for right breast carcinoma. EXAM: NUCLEAR MEDICINE PET SKULL BASE TO THIGH TECHNIQUE: 8.2 mCi F-18 FDG was injected intravenously. Full-ring PET imaging was performed from the skull base to thigh after the radiotracer. CT data was obtained and used for attenuation correction and anatomic localization. Fasting blood glucose: 103 mg/dl COMPARISON:  Neck and chest CT on 06/24/2020 FINDINGS: Mediastinal blood-pool activity (background): SUV max = 2.2 Liver activity (reference): SUV max = N/A NECK: 2 sub-cm right supraclavicular lymph nodes show hypermetabolic activity, with highest SUV max measuring 5.8. Incidental CT findings:  None. CHEST: 1.5 cm hypermetabolic soft tissue mass is seen in the medial right breast, which has SUV max of 8.9 and is consistent with the known primary breast carcinoma. Mild right subpectoral lymphadenopathy is seen, with index lymph node measuring 7 mm on image 57/4 with SUV max of 7.8. Mild left axillary lymphadenopathy is seen with index lymph node measuring 8 mm  on image 60/4, with SUV max of 7.3. Sub-cm hypermetabolic lymph nodes are also seen in the right hilum with SUV max of 5.3, the subcarinal region with SUV max of 5.8, and the lateral aortic region with SUV max of 3.7. No suspicious pulmonary nodules seen on CT images. Incidental CT findings:  None. ABDOMEN/PELVIS:  No abnormal hypermetabolic activity within the liver, pancreas, adrenal glands, or spleen. No hypermetabolic lymph nodes in the abdomen or pelvis. Incidental CT findings:  IUD noted in appropriate position. SKELETON: Focal site of hypermetabolism is seen in the left posterior T12 vertebral body, which has SUV max of 5.1. No corresponding lesion is seen on CT images, however this is consistent with a bone metastasis. Incidental CT findings:  None. IMPRESSION: 1.5 cm hypermetabolic mass in medial right breast, consistent with known primary carcinoma. Mild hypermetabolic lymphadenopathy in right supraclavicular, subpectoral, axillary, and hilar regions, and mediastinum, consistent with metastatic disease. Focal hypermetabolism in the T12 vertebral body, consistent with bone metastasis. No evidence of metastatic disease within the abdomen or pelvis. Electronically Signed   By: Marlaine Hind M.D.   On: 07/22/2020 16:31   ECHOCARDIOGRAM COMPLETE  Result Date: 07/23/2020    ECHOCARDIOGRAM REPORT   Patient Name:   Kayla Little Date of Exam: 07/22/2020 Medical Rec #:  [id number]    Height:       66.0 in Accession #:    [id number]   Weight:       160.0 lb Date of Birth:  06-May-1985    BSA:          1.819 m Patient Age:    34 years     BP:           108/70 mmHg Patient Gender: F            HR:           92 bpm. Exam Location:  Outpatient Procedure: 2D Echo, 3D Echo, Color Doppler, Limited Color Doppler and Strain            Analysis Indications:   Chemo Z09  History:       Patient has no prior history of Echocardiogram examinations.                Cancer.  Sonographer:   Darlina Sicilian RDCS Referring      [place] Phys: IMPRESSIONS  1. Left ventricular ejection fraction, by estimation, is 60 to 65%. The left ventricle has normal function. The left ventricle has no regional wall motion abnormalities. Left ventricular diastolic parameters were normal. The average left ventricular global longitudinal strain is  -20.8 %.  2. Right ventricular systolic function is normal. The right ventricular size is normal. Tricuspid regurgitation signal is inadequate for assessing PA pressure.  3. The mitral valve is normal in structure. No evidence of mitral valve regurgitation.  4. The aortic valve was not well visualized. Aortic valve regurgitation is not visualized. No aortic stenosis is present.  5. The inferior vena cava is normal in size with greater than 50% respiratory variability, suggesting right atrial pressure of 3 mmHg. FINDINGS  Left Ventricle: Left ventricular ejection fraction, by estimation, is 60 to 65%. The left ventricle has normal function. The left ventricle has no regional wall motion abnormalities. The average left ventricular global longitudinal strain is -20.8 %. The left ventricular internal cavity size was normal in size. There is no left ventricular hypertrophy. Left ventricular diastolic parameters were normal. Right Ventricle: The right ventricular  size is normal. No increase in right ventricular wall thickness. Right ventricular systolic function is normal. Tricuspid regurgitation signal is inadequate for assessing PA pressure. Left Atrium: Left atrial size was normal in size. Right Atrium: Right atrial size was normal in size. Pericardium: There is no evidence of pericardial effusion. Mitral Valve: The mitral valve is normal in structure. No evidence of mitral valve regurgitation. Tricuspid Valve: The tricuspid valve is normal in structure. Tricuspid valve regurgitation is not demonstrated. Aortic Valve: The aortic valve was not well visualized. Aortic valve regurgitation is not visualized. No aortic stenosis is present. Pulmonic Valve: The pulmonic valve was not well visualized. Pulmonic valve regurgitation is not visualized. Aorta: The aortic root and ascending aorta are structurally normal, with no evidence of dilitation. Venous: The inferior vena cava is normal in size with greater than 50%  respiratory variability, suggesting right atrial pressure of 3 mmHg. IAS/Shunts: The interatrial septum was not well visualized.  LEFT VENTRICLE PLAX 2D LVIDd:         4.90 cm  Diastology LVIDs:         3.30 cm  LV e' medial:    11.10 cm/s LV PW:         0.70 cm  LV E/e' medial:  6.4 LV IVS:        0.70 cm  LV e' lateral:   12.90 cm/s LVOT diam:     2.00 cm  LV E/e' lateral: 5.5 LV SV:         70 LV SV Index:   38       2D Longitudinal Strain LVOT Area:     3.14 cm 2D Strain GLS Avg:     -20.8 %  RIGHT VENTRICLE RV S prime:     11.60 cm/s TAPSE (M-mode): 1.8 cm LEFT ATRIUM             Index       RIGHT ATRIUM          Index LA diam:        3.10 cm 1.70 cm/m  RA Area:     9.18 cm LA Vol (A2C):   22.8 ml 12.54 ml/m RA Volume:   17.70 ml 9.73 ml/m LA Vol (A4C):   25.7 ml 14.13 ml/m LA Biplane Vol: 25.5 ml 14.02 ml/m  AORTIC VALVE LVOT Vmax:   102.00 cm/s LVOT Vmean:  70.800 cm/s LVOT VTI:    0.222 m  AORTA Ao Root diam: 3.10 cm Ao Asc diam:  2.40 cm MITRAL VALVE MV Area (PHT): 3.99 cm    SHUNTS MV Decel Time: 190 msec    Systemic VTI:  0.22 m MV E velocity: 70.70 cm/s  Systemic Diam: 2.00 cm MV A velocity: 60.00 cm/s MV E/A ratio:  1.18 Kayla Milian MD Electronically signed by Kayla Milian MD Signature Date/Time: 07/23/2020/9:16:49 AM    Final    IR IMAGING GUIDED PORT INSERTION  Result Date: 07/22/2020 INDICATION: History of metastatic right-sided breast cancer. In need of durable intravenous access for chemotherapy administration. EXAM: IMPLANTED PORT A CATH PLACEMENT WITH ULTRASOUND AND FLUOROSCOPIC GUIDANCE COMPARISON:  PET-CT-07/21/2020 MEDICATIONS: None ANESTHESIA/SEDATION: Moderate (conscious) sedation was employed during this procedure. A total of Versed 2.5 mg and Fentanyl 125 mcg was administered intravenously. Moderate Sedation Time: 32 minutes. The patient's level of consciousness and vital signs were monitored continuously by radiology nursing throughout the procedure under my  direct supervision. CONTRAST:  None FLUOROSCOPY TIME:  1 minute, 6 seconds (4 mGy) COMPLICATIONS: None  immediate. PROCEDURE: The procedure, risks, benefits, and alternatives were explained to the patient. Questions regarding the procedure were encouraged and answered. The patient understands and consents to the procedure. Given history of right-sided breast cancer decision was made to place a left internal jugular approach port a catheter. The left neck and chest were prepped with chlorhexidine in a sterile fashion, and a sterile drape was applied covering the operative field. Maximum barrier sterile technique with sterile gowns and gloves were used for the procedure. A timeout was performed prior to the initiation of the procedure. Local anesthesia was provided with 1% lidocaine with epinephrine. After creating a small venotomy incision, a micropuncture kit was utilized to access the internal jugular vein. Real-time ultrasound guidance was utilized for vascular access including the acquisition of a permanent ultrasound image documenting patency of the accessed vessel. The microwire was utilized to measure appropriate catheter length. A subcutaneous port pocket was then created along the upper chest wall utilizing a combination of sharp and blunt dissection. The pocket was irrigated with sterile saline. A single lumen "ISP" sized power injectable port was chosen for placement. The 8 Fr catheter was tunneled from the port pocket site to the venotomy incision. The port was placed in the pocket. The external catheter was trimmed to appropriate length. At the venotomy, an 8 Fr peel-away sheath was placed over a guidewire under fluoroscopic guidance. The catheter was then placed through the sheath and the sheath was removed. Final catheter positioning was confirmed and documented with a fluoroscopic spot radiograph. The port was accessed with a Huber needle, aspirated and flushed with heparinized saline. The venotomy  site was closed with an interrupted 4-0 Vicryl suture. The port pocket incision was closed with interrupted 2-0 Vicryl suture. The skin was opposed with a running subcuticular 4-0 Vicryl suture. Dermabond and Steri-strips were applied to both incisions. Dressings were applied. The patient tolerated the procedure well without immediate post procedural complication. FINDINGS: After catheter placement, the tip lies within the superior cavoatrial junction. The catheter aspirates and flushes normally and is ready for immediate use. IMPRESSION: Successful placement of a left internal jugular approach power injectable Port-A-Cath. The catheter is ready for immediate use. Electronically Signed   By: Sandi Mariscal M.D.   On: 07/22/2020 11:15     ELIGIBLE FOR AVAILABLE RESEARCH PROTOCOL:   ASSESSMENT: 35 y.o. [place] woman with a clinical mT2 N3, stage IIIA invasive ductal carcinoma, grade 1, triple positive  (a) breast MRI 07/06/2020 finds a 1.6 cm lower inner quadrant mass associated with 5.4 cm of non-mass-like enhancement, at least 8 enlarged right axillary lymph node and 1 right internal mammary lymph node  (b) CT scans of the neck and chest 06/24/2020 shows subcentimeter right cervical adenopathy, 2 right supraclavicular lymph nodes the larger 0.65 cm, also left cervical adenopathy largest measuring 1.02 cm; there is also a 1.2 cm lesion in the left hepatic lobe, and a sclerotic lesion in the mid thoracic spine at T7  (c) PET scan 07/21/2020 shows regional adenopathy (including 2 right subcentimeter supraclavicular lymph nodes and subcentimeter lymph nodes in the right hilar, subcarinal and lateral aortic region), no uptake in liver, and an indeterminate T12 lesion  (d) thoracic spine MRI 07/27/2020 shows the T12 lesion to be likely benign, there is also degenerative disc disease  (1) status post total thyroidectomy and central compartment lymph node biopsy 04/12/2019 for a pT1a pN0 medullary thyroid  carcinoma, with negative margins, no angioinvasion, and no extrathyroidal extension  (a) CEA  and calcitonin   (2) genetics testing 10/27/2018 showed   (a) 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 21 days x 6, starting 07/23/2020  (4) anti-HER2 immunotherapy to continue to total 1 year  (a) echo 07/22/2020 shows an ejection fraction in the 60-65% range.  (5) definitive surgery to follow  (6) adjuvant radiation  (7) antiestrogens   PLAN:  Ms. Glanz notes that she has been well in the interim since her last visit.  She does endorse having severe diarrhea as well as nausea and vomiting with the Perjeta therapy.  This is subsequently been removed from her next round of chemotherapy to start tomorrow.  Her rash has completely resolved and she has no residual evidence of the acneform rash previously described.  She notes that her appetite has improved and she has much greater energy.  She is happy with the way her energy has rebounded.  Overall she is willing and able to proceed with cycle 2-day 1 of trastuzumab, docetaxel, and carboplatin tomorrow.  We will also plan to follow-up for treatment with a G-CSF shot and fluids on 08/15/2020  Her calcium is still low, but improved from prior. She does have symptoms when that happens so I am adding calcitriol to her regimen to take twice daily.  She will check with Dr. Cindra Eves office to make sure he does not want to be more aggressive.   She knows to call for any other issue that may develop before the next visit  Total encounter time 30 minutes.  Ledell Peoples, MD Department of  Hematology/Oncology [place] [facility] Phone: [phone] Pager: [phone] Email: [email or web address]    *Total Encounter Time as defined by the Centers for Medicare and Medicaid Services includes, in addition to the face-to-face time of a patient visit (documented in the note above) non-face-to-face time: obtaining and reviewing outside history, ordering and reviewing medications, tests or procedures, care coordination (communications with other health care professionals or caregivers) and documentation in the medical record.

## 2020-08-14 ENCOUNTER — Inpatient Hospital Stay: Payer: 59

## 2020-08-14 ENCOUNTER — Telehealth: Payer: Self-pay | Admitting: *Deleted

## 2020-08-14 VITALS — BP 128/76 | HR 72 | Resp 18

## 2020-08-14 DIAGNOSIS — Z95828 Presence of other vascular implants and grafts: Secondary | ICD-10-CM

## 2020-08-14 DIAGNOSIS — E3122 Multiple endocrine neoplasia [MEN] type IIA: Secondary | ICD-10-CM

## 2020-08-14 DIAGNOSIS — C50211 Malignant neoplasm of upper-inner quadrant of right female breast: Secondary | ICD-10-CM

## 2020-08-14 DIAGNOSIS — Z5112 Encounter for antineoplastic immunotherapy: Secondary | ICD-10-CM | POA: Diagnosis not present

## 2020-08-14 LAB — CBC WITH DIFFERENTIAL/PLATELET
Abs Immature Granulocytes: 0.23 10*3/uL — ABNORMAL HIGH (ref 0.00–0.07)
Basophils Absolute: 0 10*3/uL (ref 0.0–0.1)
Basophils Relative: 0 %
Eosinophils Absolute: 0 10*3/uL (ref 0.0–0.5)
Eosinophils Relative: 0 %
HCT: 34.8 % — ABNORMAL LOW (ref 36.0–46.0)
Hemoglobin: 11.9 g/dL — ABNORMAL LOW (ref 12.0–15.0)
Immature Granulocytes: 1 %
Lymphocytes Relative: 7 %
Lymphs Abs: 1.2 10*3/uL (ref 0.7–4.0)
MCH: 34 pg (ref 26.0–34.0)
MCHC: 34.2 g/dL (ref 30.0–36.0)
MCV: 99.4 fL (ref 80.0–100.0)
Monocytes Absolute: 1.7 10*3/uL — ABNORMAL HIGH (ref 0.1–1.0)
Monocytes Relative: 9 %
Neutro Abs: 15.3 10*3/uL — ABNORMAL HIGH (ref 1.7–7.7)
Neutrophils Relative %: 83 %
Platelets: 367 10*3/uL (ref 150–400)
RBC: 3.5 MIL/uL — ABNORMAL LOW (ref 3.87–5.11)
RDW: 14 % (ref 11.5–15.5)
WBC: 18.4 10*3/uL — ABNORMAL HIGH (ref 4.0–10.5)
nRBC: 0 % (ref 0.0–0.2)

## 2020-08-14 LAB — COMPREHENSIVE METABOLIC PANEL
ALT: 28 U/L (ref 0–44)
AST: 13 U/L — ABNORMAL LOW (ref 15–41)
Albumin: 4 g/dL (ref 3.5–5.0)
Alkaline Phosphatase: 62 U/L (ref 38–126)
Anion gap: 12 (ref 5–15)
BUN: 12 mg/dL (ref 6–20)
CO2: 25 mmol/L (ref 22–32)
Calcium: 9 mg/dL (ref 8.9–10.3)
Chloride: 104 mmol/L (ref 98–111)
Creatinine, Ser: 0.77 mg/dL (ref 0.44–1.00)
GFR, Estimated: >60 mL/min (ref >60)
Glucose, Bld: 139 mg/dL — ABNORMAL HIGH (ref 70–99)
Potassium: 4.4 mmol/L (ref 3.5–5.1)
Sodium: 141 mmol/L (ref 135–145)
Total Bilirubin: 0.3 mg/dL (ref 0.3–1.2)
Total Protein: 6.9 g/dL (ref 6.5–8.1)

## 2020-08-14 LAB — PREGNANCY, URINE: Preg Test, Ur: NEGATIVE

## 2020-08-14 MED ORDER — DIPHENHYDRAMINE HCL 25 MG PO CAPS
25.0000 mg | ORAL_CAPSULE | Freq: Once | ORAL | Status: AC
  Administered 2020-08-14: 25 mg via ORAL

## 2020-08-14 MED ORDER — FOSAPREPITANT DIMEGLUMINE INJECTION 150 MG
150.0000 mg | Freq: Once | INTRAVENOUS | Status: AC
  Administered 2020-08-14: 150 mg via INTRAVENOUS
  Filled 2020-08-14: qty 150

## 2020-08-14 MED ORDER — SODIUM CHLORIDE 0.9 % IV SOLN
75.0000 mg/m2 | Freq: Once | INTRAVENOUS | Status: AC
  Administered 2020-08-14: 140 mg via INTRAVENOUS
  Filled 2020-08-14: qty 14

## 2020-08-14 MED ORDER — SODIUM CHLORIDE 0.9 % IV SOLN
Freq: Once | INTRAVENOUS | Status: AC
  Filled 2020-08-14: qty 250

## 2020-08-14 MED ORDER — ACETAMINOPHEN 325 MG PO TABS
650.0000 mg | ORAL_TABLET | Freq: Once | ORAL | Status: AC
  Administered 2020-08-14: 650 mg via ORAL

## 2020-08-14 MED ORDER — ACETAMINOPHEN 325 MG PO TABS
ORAL_TABLET | ORAL | Status: AC
  Filled 2020-08-14: qty 2

## 2020-08-14 MED ORDER — HEPARIN SOD (PORK) LOCK FLUSH 100 UNIT/ML IV SOLN
500.0000 [IU] | Freq: Once | INTRAVENOUS | Status: DC
  Filled 2020-08-14: qty 5

## 2020-08-14 MED ORDER — SODIUM CHLORIDE 0.9 % IV SOLN
700.0000 mg | Freq: Once | INTRAVENOUS | Status: AC
  Administered 2020-08-14: 700 mg via INTRAVENOUS
  Filled 2020-08-14: qty 70

## 2020-08-14 MED ORDER — PALONOSETRON HCL INJECTION 0.25 MG/5ML
0.2500 mg | Freq: Once | INTRAVENOUS | Status: AC
  Administered 2020-08-14: 0.25 mg via INTRAVENOUS

## 2020-08-14 MED ORDER — LORAZEPAM 0.5 MG PO TABS: 0.5000 mg | ORAL_TABLET | Freq: Two times a day (BID) | ORAL | 0 refills | Status: DC | PRN

## 2020-08-14 MED ORDER — DEXAMETHASONE SODIUM PHOSPHATE 100 MG/10ML IJ SOLN
10.0000 mg | Freq: Once | INTRAMUSCULAR | Status: AC
  Administered 2020-08-14: 10 mg via INTRAVENOUS
  Filled 2020-08-14: qty 10

## 2020-08-14 MED ORDER — SODIUM CHLORIDE 0.9 % IV SOLN
450.0000 mg | Freq: Once | INTRAVENOUS | Status: AC
  Administered 2020-08-14: 450 mg via INTRAVENOUS
  Filled 2020-08-14: qty 21.43

## 2020-08-14 MED ORDER — HEPARIN SOD (PORK) LOCK FLUSH 100 UNIT/ML IV SOLN
500.0000 [IU] | Freq: Once | INTRAVENOUS | Status: AC | PRN
  Administered 2020-08-14: 500 [IU]
  Filled 2020-08-14: qty 5

## 2020-08-14 MED ORDER — SODIUM CHLORIDE 0.9% FLUSH
10.0000 mL | INTRAVENOUS | Status: DC | PRN
  Administered 2020-08-14: 10 mL via INTRAVENOUS
  Filled 2020-08-14: qty 10

## 2020-08-14 MED ORDER — PALONOSETRON HCL INJECTION 0.25 MG/5ML
INTRAVENOUS | Status: AC
  Filled 2020-08-14: qty 5

## 2020-08-14 MED ORDER — DIPHENHYDRAMINE HCL 25 MG PO CAPS
ORAL_CAPSULE | ORAL | Status: AC
  Filled 2020-08-14: qty 1

## 2020-08-14 MED ORDER — SODIUM CHLORIDE 0.9% FLUSH
10.0000 mL | INTRAVENOUS | Status: DC | PRN
  Administered 2020-08-14: 10 mL
  Filled 2020-08-14: qty 10

## 2020-08-14 NOTE — Telephone Encounter (Signed)
Per visit today for injection - pt inquired about medication for anxiety and insomnia associated with steroid use with taxotere  Ativan sent to verified pharmacy with pt understanding of goal to not use more then 3 days a weeks for best outcome- continued use lessens it's effectiveness.

## 2020-08-14 NOTE — Patient Instructions (Signed)
[facility]  Discharge Instructions: Thank you for choosing [facility] to provide your oncology and hematology care.   If you have a lab appointment with the [facility], please go directly to the [facility] and check in at the registration area.   Wear comfortable clothing and clothing appropriate for easy access to any Portacath or PICC line.   We strive to give you quality time with your provider. You may need to reschedule your appointment if you arrive late (15 or more minutes).  Arriving late affects you and other patients whose appointments are after yours.  Also, if you miss three or more appointments without notifying the office, you may be dismissed from the clinic at the provider's discretion.      For prescription refill requests, have your pharmacy contact our office and allow 72 hours for refills to be completed.    Today you received the following chemotherapy and/or immunotherapy agents: Herceptin, docetaxel, carboplatin.     To help prevent nausea and vomiting after your treatment, we encourage you to take your nausea medication as directed.  BELOW ARE SYMPTOMS THAT SHOULD BE REPORTED IMMEDIATELY: *FEVER GREATER THAN 100.4 F (38 C) OR HIGHER *CHILLS OR SWEATING *NAUSEA AND VOMITING THAT IS NOT CONTROLLED WITH YOUR NAUSEA MEDICATION *UNUSUAL SHORTNESS OF BREATH *UNUSUAL BRUISING OR BLEEDING *URINARY PROBLEMS (pain or burning when urinating, or frequent urination) *BOWEL PROBLEMS (unusual diarrhea, constipation, pain near the anus) TENDERNESS IN MOUTH AND THROAT WITH OR WITHOUT PRESENCE OF ULCERS (sore throat, sores in mouth, or a toothache) UNUSUAL RASH, SWELLING OR PAIN  UNUSUAL VAGINAL DISCHARGE OR ITCHING   Items with * indicate a potential emergency and should be followed up as soon as possible or go to the Emergency Department if any problems should occur.  Please show the CHEMOTHERAPY ALERT CARD or IMMUNOTHERAPY  ALERT CARD at check-in to the Emergency Department and triage nurse.  Should you have questions after your visit or need to cancel or reschedule your appointment, please contact [facility]  Dept: [phone]  and follow the prompts.  Office hours are 8:00 a.m. to 4:30 p.m. Monday - Friday. Please note that voicemails left after 4:00 p.m. may not be returned until the following business day.  We are closed weekends and major holidays. You have access to a nurse at all times for urgent questions. Please call the main number to the clinic Dept: [phone] and follow the prompts.   For any non-urgent questions, you may also contact your provider using MyChart. We now offer e-Visits for anyone 48 and older to request care online for non-urgent symptoms. For details visit [email or web address].   Also download the MyChart app! Go to the app store, search "MyChart", open the app, select [facility], and log in with your MyChart username and password.  Due to Covid, a mask is required upon entering the hospital/clinic. If you do not have a mask, one will be given to you upon arrival. For doctor visits, patients may have 1 support person aged 77 or older with them. For treatment visits, patients cannot have anyone with them due to current Covid guidelines and our immunocompromised population.

## 2020-08-15 LAB — CALCIUM, IONIZED: Calcium, Ionized, Serum: 4.9 mg/dL (ref 4.5–5.6)

## 2020-08-16 ENCOUNTER — Ambulatory Visit: Payer: 59

## 2020-08-16 ENCOUNTER — Inpatient Hospital Stay: Payer: 59

## 2020-08-16 ENCOUNTER — Other Ambulatory Visit: Payer: Self-pay

## 2020-08-16 VITALS — BP 124/77 | HR 90 | Temp 96.0°F | Resp 18

## 2020-08-16 DIAGNOSIS — R112 Nausea with vomiting, unspecified: Secondary | ICD-10-CM

## 2020-08-16 DIAGNOSIS — C50211 Malignant neoplasm of upper-inner quadrant of right female breast: Secondary | ICD-10-CM

## 2020-08-16 DIAGNOSIS — Z5112 Encounter for antineoplastic immunotherapy: Secondary | ICD-10-CM | POA: Diagnosis not present

## 2020-08-16 MED ORDER — PEGFILGRASTIM-BMEZ 6 MG/0.6ML ~~LOC~~ SOSY
6.0000 mg | PREFILLED_SYRINGE | Freq: Once | SUBCUTANEOUS | Status: AC
Start: 2020-08-16 — End: 2020-08-16
  Administered 2020-08-16: 6 mg via SUBCUTANEOUS

## 2020-08-16 MED ORDER — HEPARIN SOD (PORK) LOCK FLUSH 100 UNIT/ML IV SOLN
500.0000 [IU] | Freq: Once | INTRAVENOUS | Status: AC
  Administered 2020-08-16: 500 [IU] via INTRAVENOUS
  Filled 2020-08-16: qty 5

## 2020-08-16 MED ORDER — SODIUM CHLORIDE 0.9 % IV SOLN
Freq: Once | INTRAVENOUS | Status: AC
  Filled 2020-08-16: qty 250

## 2020-08-16 MED ORDER — PEGFILGRASTIM-BMEZ 6 MG/0.6ML ~~LOC~~ SOSY
PREFILLED_SYRINGE | SUBCUTANEOUS | Status: AC
  Filled 2020-08-16: qty 0.6

## 2020-08-16 MED ORDER — SODIUM CHLORIDE 0.9% FLUSH
10.0000 mL | Freq: Once | INTRAVENOUS | Status: AC
Start: 2020-08-16 — End: 2020-08-16
  Administered 2020-08-16: 10 mL via INTRAVENOUS
  Filled 2020-08-16: qty 10

## 2020-08-16 NOTE — Patient Instructions (Signed)

## 2020-08-19 ENCOUNTER — Encounter: Payer: Self-pay | Admitting: Oncology

## 2020-08-28 ENCOUNTER — Telehealth: Payer: Self-pay | Admitting: Adult Health

## 2020-08-28 ENCOUNTER — Telehealth: Payer: Self-pay | Admitting: *Deleted

## 2020-08-28 NOTE — Telephone Encounter (Signed)
This RN contacted pt per message yesterday sent by her mother ( also our patient ) stating they have both tested positive for Covid on 6/21 .  Kayla Little states she is having very mild congestion symptoms, no fevers and no cough " I really would think was was just allergies "  She states her 37 yr old son is positive with a low grade fever.  This RN discussed above with current plan to proceed with treatment as scheduled next week unless she develops worsening symptoms.  Kayla Little states she would like to keep on schedule.  This RN informed her she will receive call about how to proceed with visit next week including she should not come in the building until she is called and can be escorted directly to a private room in the treatment area.  Kayla Little verbalized understanding.  This RN will send an Urgent LOS to arrange appt to reflect needed Covid protocol for this office.

## 2020-08-28 NOTE — Telephone Encounter (Signed)
Scheduled appointment per 06/23 sch msg. Left message with detailed instructions for covid protocol.

## 2020-09-01 ENCOUNTER — Encounter: Payer: Self-pay | Admitting: *Deleted

## 2020-09-04 ENCOUNTER — Inpatient Hospital Stay: Payer: 59

## 2020-09-04 ENCOUNTER — Inpatient Hospital Stay (HOSPITAL_BASED_OUTPATIENT_CLINIC_OR_DEPARTMENT_OTHER): Payer: 59 | Admitting: Adult Health

## 2020-09-04 ENCOUNTER — Other Ambulatory Visit: Payer: Self-pay

## 2020-09-04 ENCOUNTER — Other Ambulatory Visit: Payer: Self-pay | Admitting: *Deleted

## 2020-09-04 ENCOUNTER — Inpatient Hospital Stay: Payer: 59 | Admitting: Adult Health

## 2020-09-04 VITALS — BP 115/69 | HR 86 | Temp 98.5°F | Resp 18 | Ht 66.0 in | Wt 161.0 lb

## 2020-09-04 DIAGNOSIS — C50211 Malignant neoplasm of upper-inner quadrant of right female breast: Secondary | ICD-10-CM

## 2020-09-04 DIAGNOSIS — Z170 Estrogen receptor positive status [ER+]: Secondary | ICD-10-CM

## 2020-09-04 DIAGNOSIS — Z5112 Encounter for antineoplastic immunotherapy: Secondary | ICD-10-CM | POA: Diagnosis not present

## 2020-09-04 DIAGNOSIS — E3122 Multiple endocrine neoplasia [MEN] type IIA: Secondary | ICD-10-CM

## 2020-09-04 LAB — CBC WITH DIFFERENTIAL/PLATELET
Abs Immature Granulocytes: 0.08 10*3/uL — ABNORMAL HIGH (ref 0.00–0.07)
Basophils Absolute: 0 10*3/uL (ref 0.0–0.1)
Basophils Relative: 0 %
Eosinophils Absolute: 0 10*3/uL (ref 0.0–0.5)
Eosinophils Relative: 0 %
HCT: 33 % — ABNORMAL LOW (ref 36.0–46.0)
Hemoglobin: 11.3 g/dL — ABNORMAL LOW (ref 12.0–15.0)
Immature Granulocytes: 1 %
Lymphocytes Relative: 10 %
Lymphs Abs: 1 10*3/uL (ref 0.7–4.0)
MCH: 33.9 pg (ref 26.0–34.0)
MCHC: 34.2 g/dL (ref 30.0–36.0)
MCV: 99.1 fL (ref 80.0–100.0)
Monocytes Absolute: 0.8 10*3/uL (ref 0.1–1.0)
Monocytes Relative: 8 %
Neutro Abs: 8.6 10*3/uL — ABNORMAL HIGH (ref 1.7–7.7)
Neutrophils Relative %: 81 %
Platelets: 377 10*3/uL (ref 150–400)
RBC: 3.33 MIL/uL — ABNORMAL LOW (ref 3.87–5.11)
RDW: 14.4 % (ref 11.5–15.5)
WBC: 10.5 10*3/uL (ref 4.0–10.5)
nRBC: 0 % (ref 0.0–0.2)

## 2020-09-04 LAB — COMPREHENSIVE METABOLIC PANEL
ALT: 38 U/L (ref 0–44)
AST: 39 U/L (ref 15–41)
Albumin: 3.8 g/dL (ref 3.5–5.0)
Alkaline Phosphatase: 72 U/L (ref 38–126)
Anion gap: 9 (ref 5–15)
BUN: 13 mg/dL (ref 6–20)
CO2: 22 mmol/L (ref 22–32)
Calcium: 8.4 mg/dL — ABNORMAL LOW (ref 8.9–10.3)
Chloride: 108 mmol/L (ref 98–111)
Creatinine, Ser: 0.75 mg/dL (ref 0.44–1.00)
GFR, Estimated: >60 mL/min (ref >60)
Glucose, Bld: 214 mg/dL — ABNORMAL HIGH (ref 70–99)
Potassium: 4 mmol/L (ref 3.5–5.1)
Sodium: 139 mmol/L (ref 135–145)
Total Bilirubin: 0.3 mg/dL (ref 0.3–1.2)
Total Protein: 6.8 g/dL (ref 6.5–8.1)

## 2020-09-04 MED ORDER — TRASTUZUMAB-ANNS CHEMO 150 MG IV SOLR
450.0000 mg | Freq: Once | INTRAVENOUS | Status: AC
  Administered 2020-09-04: 450 mg via INTRAVENOUS
  Filled 2020-09-04: qty 21.43

## 2020-09-04 MED ORDER — ACETAMINOPHEN 325 MG PO TABS
650.0000 mg | ORAL_TABLET | Freq: Once | ORAL | Status: AC
  Administered 2020-09-04: 650 mg via ORAL

## 2020-09-04 MED ORDER — SODIUM CHLORIDE 0.9 % IV SOLN
700.0000 mg | Freq: Once | INTRAVENOUS | Status: AC
  Administered 2020-09-04: 700 mg via INTRAVENOUS
  Filled 2020-09-04: qty 70

## 2020-09-04 MED ORDER — COLD PACK MISC ONCOLOGY
1.0000 | Freq: Once | Status: DC | PRN
  Filled 2020-09-04: qty 1

## 2020-09-04 MED ORDER — SODIUM CHLORIDE 0.9 % IV SOLN
75.0000 mg/m2 | Freq: Once | INTRAVENOUS | Status: AC
  Administered 2020-09-04: 140 mg via INTRAVENOUS
  Filled 2020-09-04: qty 14

## 2020-09-04 MED ORDER — SODIUM CHLORIDE 0.9% FLUSH
10.0000 mL | INTRAVENOUS | Status: DC | PRN
  Administered 2020-09-04: 10 mL
  Filled 2020-09-04: qty 10

## 2020-09-04 MED ORDER — ACETAMINOPHEN 325 MG PO TABS
ORAL_TABLET | ORAL | Status: AC
  Filled 2020-09-04: qty 2

## 2020-09-04 MED ORDER — PALONOSETRON HCL INJECTION 0.25 MG/5ML
0.2500 mg | Freq: Once | INTRAVENOUS | Status: AC
  Administered 2020-09-04: 0.25 mg via INTRAVENOUS

## 2020-09-04 MED ORDER — SODIUM CHLORIDE 0.9 % IV SOLN
Freq: Once | INTRAVENOUS | Status: AC
  Filled 2020-09-04: qty 250

## 2020-09-04 MED ORDER — HEPARIN SOD (PORK) LOCK FLUSH 100 UNIT/ML IV SOLN
500.0000 [IU] | Freq: Once | INTRAVENOUS | Status: AC | PRN
  Administered 2020-09-04: 500 [IU]
  Filled 2020-09-04: qty 5

## 2020-09-04 MED ORDER — PALONOSETRON HCL INJECTION 0.25 MG/5ML
INTRAVENOUS | Status: AC
  Filled 2020-09-04: qty 5

## 2020-09-04 MED ORDER — DIPHENHYDRAMINE HCL 25 MG PO CAPS
25.0000 mg | ORAL_CAPSULE | Freq: Once | ORAL | Status: AC
  Administered 2020-09-04: 25 mg via ORAL

## 2020-09-04 MED ORDER — DIPHENHYDRAMINE HCL 25 MG PO CAPS
ORAL_CAPSULE | ORAL | Status: AC
  Filled 2020-09-04: qty 1

## 2020-09-04 MED ORDER — DEXAMETHASONE SODIUM PHOSPHATE 100 MG/10ML IJ SOLN
10.0000 mg | Freq: Once | INTRAMUSCULAR | Status: AC
  Administered 2020-09-04: 10 mg via INTRAVENOUS
  Filled 2020-09-04: qty 10

## 2020-09-04 MED ORDER — SODIUM CHLORIDE 0.9 % IV SOLN
150.0000 mg | Freq: Once | INTRAVENOUS | Status: AC
  Administered 2020-09-04: 150 mg via INTRAVENOUS
  Filled 2020-09-04: qty 150

## 2020-09-04 NOTE — Progress Notes (Signed)
[place]  [phone] [phone]     ID: Kayla Little DOB: Jun 14, 1985  MR#: [id number]  WNU#:[id number]  Patient Care Team: [name] [place] as PCP - General (Family Medicine) Magrinat, Virgie Dad, MD as Consulting Physician (Oncology) Kyung Rudd, MD as Consulting Physician (Radiation Oncology) Armandina Gemma, MD as Consulting Physician (General Surgery) Rockwell Germany, RN as Registered Nurse Mauro Kaufmann, RN as Registered Nurse Scot Dock, NP OTHER MD:  CHIEF COMPLAINT: Triple positive locally advanced breast cancer  CURRENT TREATMENT: Neoadjuvant chemotherapy   INTERVAL HISTORY: Kayla Little returns today for follow up and treatment of her locally advanced breast cancer. She was evaluated in the breast cancer clinic on 07/31/2020.  She is here today to receive cycle 3 day 1 of Docetaxel, carboplatin and Trastuzumab.  She was receiving Pertuzmab, however that was discontinued after cycle 1 due to diarrhea and dehydration.     Kayla Little was diagnosed with Covid about a week ago.  She and her family went to the beach and several members tested positive.  She notes she has experienced nasal drainage and a cough, but nothing worse than what she would call allergies.    She notes that since discontinuing Pertuzumab she has felt improved--her calcium level decreased significantly after the first cycle when she experienced diarrhea.  She notes that all diarrhea has resolved and she has had no further bowel issues.    Kayla Little denies any mucositis, nausea, vomiting, or peripheral neuropathy.     REVIEW OF SYSTEMS: Review of Systems  Constitutional:  Positive for fatigue. Negative for appetite change, chills, fever and unexpected weight change.  HENT:   Negative for hearing loss, lump/mass and trouble swallowing.   Eyes:  Negative for eye problems and icterus.  Respiratory:  Negative for chest tightness, cough and shortness of  breath.   Cardiovascular:  Negative for chest pain, leg swelling and palpitations.  Gastrointestinal:  Negative for abdominal distention, abdominal pain, constipation, diarrhea, nausea and vomiting.  Endocrine: Negative for hot flashes.  Genitourinary:  Negative for difficulty urinating.   Musculoskeletal:  Negative for arthralgias.  Skin:  Negative for itching and rash.  Neurological:  Negative for dizziness, extremity weakness, headaches and numbness.  Hematological:  Negative for adenopathy. Does not bruise/bleed easily.  Psychiatric/Behavioral:  Negative for depression. The patient is not nervous/anxious.     COVID 19 VACCINATION STATUS: Status post Moderna x2 followed by [employer] booster   HISTORY OF CURRENT ILLNESS: From the original intake note:  Kayla Pro "Kayla Little" (the daughter of my patient, Kayla Little, whom I saw earlier today). has a history of medullary thyroid cancer, status post total thyroidectomy on 04/12/2019 under Dr. Harlow Asa.   More recently she presented to her GYN with a right supraclavicular lump, prompting neck and chest CT on 06/24/2020. Neck CT showed: no recurrent mass in thyroid bed, palpable abnormality corresponds to a cluster of 2 lymph nodes in right supraclavicular region; prominent lym ph nodes in neck bilaterally. Chest CT showed: enhancing 1.4 cm lesion in medial right breast with associated mildly enlarged right axillary lymph nodes; 1.2 cm lesion in left hepatic lobe with central hyperattenuation; enlargement of right infrahilar and right hilar nodal tissue is borderline suspicious; sclerotic lesion with spiculated margins at T7 level favored to represent a bone island.  She underwent bilateral diagnostic mammography with tomography and bilateral breast ultrasonography at [facility] on 06/27/2020 showing: breast density category D; Right Breast 1. 2.6 cm irregular mass  in inner breast, with associated microcalcifications spanning up to 5.1 cm, corresponding to  chest CT finding. 2. Adjacent 0.9 cm mass in inner breast, 2 cm away from dominant mass. 3. Two indeterminate masses in outer breast. 4. At least 3 enlarged lymph nodes in right axilla  Left Breast 1. Indeterminate 1.2 cm mass in lower-outer breast, possibly a fibroadenoma 2. No enlarged lymphadenopathy in left axilla.  Accordingly on 06/30/2020 she proceeded to biopsy of the right breast area in question. The pathology from this procedure ([id number]) showed:  1. Dominant mass  - invasive ductal carcinoma, grade 1  - ductal carcinoma in situ  - Prognostic indicators significant for: estrogen receptor, 90% positive and progesterone receptor, 80% positive, both with moderate staining intensity.  HER2 equivocal by immunohistochemistry (2+), but positive by FISH, with a signals ratio of 2.2 and the number per cell greater than 5.. 2. Smaller mass  - invasive ductal carcinoma, grade 1  - ductal carcinoma in situ  - Prognostic indicators significant for: estrogen receptor, 100% positive with strong staining intensity and progesterone receptor, 50% positive with moderate staining intensity.  HER2 positive by immunohistochemistry (3+). 3. Right axillary lymph node  - invasive ductal carcinoma, grade 1 Prognostic indicators significant for: estrogen receptor, 100% positive and progesterone receptor, 90% positive, both with strong staining intensity. HER2 equivocal by immunohistochemistry (2+), {but (positive or negative) by fluorescent in situ hybridization   Biopsy of the left breast area was performed the same day. Pathology from the procedure ([id number]) was benign.  She underwent breast MRI on 07/06/2020 showing: breast composition C; enhancing mass with adjacent non-mass enhancement in lower-inner right breast, which together measure 5.4 cm, corresponding with two biopsy-proven areas of malignancy; no other suspicious enhancement identified in right breast; one 4 mm right internal mammary lymph  node with unclear significance; numerous abnormal right axillary lymph nodes; no evidence of left breast malignancy.  Cancer Staging Malignant neoplasm of upper-inner quadrant of right breast in female, estrogen receptor positive ([place]) Staging form: Breast, AJCC 8th Edition - Clinical stage from 07/08/2020: Stage IIIA (cT3, cN3, cM0, G1, ER+, PR+, HER2+) - Unsigned Stage prefix: Initial diagnosis Histologic grading system: 3 grade system  The patient's subsequent history is as detailed below.   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)    Hx of varicella    Kidney stone 2012   Medullary thyroid carcinoma ([place])    Monoallelic mutation of CHEK2 gene in female patient    Postpartum care following vaginal delivery (7/27) 10/02/2014, June 16 , 2020   Thyroid cancer [facility]) 2021    PAST SURGICAL HISTORY: Past Surgical History:  Procedure Laterality Date   IR IMAGING GUIDED PORT INSERTION  07/22/2020   NO PAST SURGERIES     THYROIDECTOMY N/A 04/12/2019   Procedure: TOTAL THYROIDECTOMY WITH LIMITED LYMPH NODE DISSECTION;  Surgeon: Armandina Gemma, MD;  Location: [facility];  Service: General;  Laterality: N/A;   UNILATERAL SALPINGECTOMY Right 07/09/2017   Procedure: RIGHT SALPINGECTOMY WITH REMOVAL OF ECTOPIC PREGNANCY;  Surgeon: Azucena Fallen, MD;  Location: [facility];  Service: Gynecology;  Laterality: Right;    FAMILY HISTORY: Family History  Problem Relation Age of Onset   Breast cancer Mother 73       2 types of cancer one side mastectomy with chemo   Endometriosis Mother    Endometriosis Maternal Grandmother    Breast cancer Maternal Grandmother        post  menopausal   Diabetes Maternal Grandmother    Cancer Maternal Grandfather        prostate and lymphoma   Alzheimer's disease Paternal Grandmother    Cancer Father 35       prostate cancer   Aneurysm Paternal Grandfather        d. 4s   Please see genetic counseling note dated 11/23/2018 for full family information.   GYNECOLOGIC HISTORY:  No LMP recorded. (Menstrual status: IUD). Menarche: 35 years old Age at first live birth: 35 years old [place] P 2 Patient has a Mirena IUD in place HRT n/a  Hysterectomy? no BSO?  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  Her husband Barnabas Lister works for [employer].  Their children are Dylan 5 and Emma 2.  Barnabas Lister has a 39 year old daughter, the patient's stepdaughter Hildred Alamin, currently attending [employer].  The patient is a [employer]    ADVANCED DIRECTIVES: In the absence of any documents to the contrary the patient's husband is her healthcare power of attorney   HEALTH MAINTENANCE: Social History   Tobacco Use   Smoking status: Some Days    Packs/day: 0.50    Years: 5.00    Pack years: 2.50    Types: Cigarettes   Smokeless tobacco: Never   Tobacco comments:    Actively trying to quit.  Vaping Use   Vaping Use: Never used  Substance Use Topics   Alcohol use: Yes    Alcohol/week: 7.0 - 14.0 standard drinks    Types: 7 - 14 Glasses of wine per week    Comment: glass of wine each night    Drug use: Not Currently    Types: Marijuana    Comment: not during pregnancy, last use was 03/27/2019     Colonoscopy: n/a (age)  PAP:   Bone density: n/a (age)   No Known Allergies  Current Outpatient Medications  Medication Sig Dispense Refill   acetaminophen (TYLENOL) 325 MG tablet Take 2 tablets (650 mg total) by mouth every 4 (four) hours as needed (for pain scale < 4). 100 tablet 0   calcitRIOL (ROCALTROL) 0.5 MCG capsule Take 1 capsule (0.5  mcg total) by mouth 2 (two) times a week. 30 capsule 1   dexamethasone (DECADRON) 4 MG tablet Take 2 tablets (8 mg total) by mouth 2 (two) times daily. Start the day before Taxotere. Then take daily x 3 days after chemotherapy. 30 tablet 1   doxycycline (VIBRA-TABS) 100 MG tablet Take 1 tablet (100 mg total) by mouth daily. 90 tablet 0   levonorgestrel (MIRENA) 20 MCG/24HR IUD 1 each by Intrauterine route once.     levothyroxine (SYNTHROID) 175 MCG tablet Take 175 mcg by mouth daily before breakfast.     lidocaine-prilocaine (EMLA) cream Apply to affected area once (Patient taking differently: Apply 1 application topically as needed (port access).) 30 g 3   loperamide (IMODIUM) 2 MG capsule Take 2 mg by mouth as needed for diarrhea or loose stools.     loratadine (CLARITIN) 10 MG tablet Take 1 tablet (  10 mg total) by mouth daily. 90 tablet 0   LORazepam (ATIVAN) 0.5 MG tablet Take 1 tablet (0.5 mg total) by mouth every 12 (twelve) hours as needed for anxiety. 30 tablet 0   ondansetron (ZOFRAN) 8 MG tablet Take 1 tablet (8 mg total) by mouth 2 (two) times daily as needed (Nausea or vomiting). Start on the third day after chemotherapy. 30 tablet 1   prochlorperazine (COMPAZINE) 10 MG tablet Take 1 tablet (10 mg total) by mouth every 6 (six) hours as needed (Nausea or vomiting). 30 tablet 1   sertraline (ZOLOFT) 50 MG tablet Take 50 mg by mouth at bedtime.     valACYclovir (VALTREX) 500 MG tablet Take 1 tablet (500 mg total) by mouth daily. 90 tablet 0   VYVANSE 40 MG capsule Take 40 mg by mouth every morning.     VYVANSE 60 MG capsule Take 60 mg by mouth every morning.     No current facility-administered medications for this visit.    OBJECTIVE: White woman who appears stated age Examined in chemo room.  See [facility] for vitals There were no vitals filed for this visit.    There is no height or weight on file to calculate BMI.   Wt Readings from Last 3 Encounters:  09/04/20 161 lb (73 kg)   08/13/20 160 lb 8 oz (72.8 kg)  07/30/20 159 lb (72.1 kg)  ECOG FS:1 - Symptomatic but completely ambulatory GENERAL: Patient is a well appearing female in no acute distress HEENT:  Sclerae anicteric.  Oropharynx clear and moist. No ulcerations or evidence of oropharyngeal candidiasis. Neck is supple.  NODES:  No cervical, supraclavicular, or axillary lymphadenopathy palpated.  BREAST EXAM:  Deferred. LUNGS:  Clear to auscultation bilaterally.  No wheezes or rhonchi. HEART:  Regular rate and rhythm. No murmur appreciated. ABDOMEN:  Soft, nontender.  Positive, normoactive bowel sounds. No organomegaly palpated. MSK:  No focal spinal tenderness to palpation. Full range of motion bilaterally in the upper extremities. EXTREMITIES:  No peripheral edema.   SKIN:  Clear with no obvious rashes or skin changes. No nail dyscrasia. NEURO:  Nonfocal. Well oriented.  Appropriate affect.    LAB RESULTS:  CMP     Component Value Date/Time   NA 141 08/14/2020 0912   NA 142 02/16/2017 1332   K 4.4 08/14/2020 0912   CL 104 08/14/2020 0912   CO2 25 08/14/2020 0912   GLUCOSE 139 (H) 08/14/2020 0912   BUN 12 08/14/2020 0912   BUN 9 02/16/2017 1332   CREATININE 0.77 08/14/2020 0912   CREATININE 0.91 07/08/2020 1400   CREATININE 0.82 12/04/2012 1732   CALCIUM 9.0 08/14/2020 0912   PROT 6.9 08/14/2020 0912   PROT 6.5 02/16/2017 1332   ALBUMIN 4.0 08/14/2020 0912   ALBUMIN 4.3 02/16/2017 1332   AST 13 (L) 08/14/2020 0912   AST 27 07/08/2020 1400   ALT 28 08/14/2020 0912   ALT 28 07/08/2020 1400   ALKPHOS 62 08/14/2020 0912   BILITOT 0.3 08/14/2020 0912   BILITOT 0.2 (L) 07/08/2020 1400   GFRNONAA >60 08/14/2020 0912   GFRNONAA >60 07/08/2020 1400   GFRAA >60 04/15/2019 2145    No results found for: TOTALPROTELP, ALBUMINELP, A1GS, A2GS, BETS, BETA2SER, GAMS, MSPIKE, SPEI  Lab Results  Component Value Date   WBC 18.4 (H) 08/14/2020   NEUTROABS 15.3 (H) 08/14/2020   HGB 11.9 (L)  08/14/2020   HCT 34.8 (L) 08/14/2020   MCV 99.4 08/14/2020   PLT 367  08/14/2020    No results found for: LABCA2  No components found for: GNFAOZ308  No results for input(s): INR in the last 168 hours.  No results found for: LABCA2  No results found for: CAN199  No results found for: CAN125  No results found for: MVH846  Lab Results  Component Value Date   CA2729 15.9 07/30/2020    No components found for: HGQUANT  Lab Results  Component Value Date   CEA1 5.17 (H) 07/30/2020   /  CEA ([facility])  Date Value Ref Range Status  07/30/2020 5.17 (H) 0.00 - 5.00 ng/mL Final    Comment:    (NOTE) This test was performed using Architect's Chemiluminescent Microparticle Immunoassay. Values obtained from different assay methods cannot be used interchangeably. Please note that 5-10% of patients who smoke may see CEA levels up to 6.9 ng/mL. Performed at [facility], [place] [address], [place], [place] [address]      No results found for: AFPTUMOR  No results found for: CHROMOGRNA  No results found for: KPAFRELGTCHN, LAMBDASER, KAPLAMBRATIO (kappa/lambda light chains)  No results found for: HGBA, HGBA2QUANT, HGBFQUANT, HGBSQUAN (Hemoglobinopathy evaluation)   No results found for: LDH  No results found for: IRON, TIBC, IRONPCTSAT (Iron and TIBC)  No results found for: FERRITIN  Urinalysis    Component Value Date/Time   COLORURINE YELLOW 07/31/2020 [place] 07/31/2020 0558   LABSPEC 1.005 07/31/2020 0558   PHURINE 6.0 07/31/2020 [place] 07/31/2020 0558   HGBUR NEGATIVE 07/31/2020 0558   BILIRUBINUR NEGATIVE 07/31/2020 0558   BILIRUBINUR small (A) 02/16/2017 1216   BILIRUBINUR small 12/04/2012 [place] 07/31/2020 0558   PROTEINUR NEGATIVE 07/31/2020 0558   UROBILINOGEN 0.2 02/16/2017 1216   UROBILINOGEN 0.2 09/18/2013 1555   NITRITE NEGATIVE 07/31/2020 0558   LEUKOCYTESUR  NEGATIVE 07/31/2020 0558    STUDIES: No results found.    ELIGIBLE FOR AVAILABLE RESEARCH PROTOCOL:   ASSESSMENT: 35 y.o. [place] woman with a clinical mT2 N3, stage IIIA invasive ductal carcinoma, grade 1, triple positive  (a) breast MRI 07/06/2020 finds a 1.6 cm lower inner quadrant mass associated with 5.4 cm of non-mass-like enhancement, at least 8 enlarged right axillary lymph node and 1 right internal mammary lymph node  (b) CT scans of the neck and chest 06/24/2020 shows subcentimeter right cervical adenopathy, 2 right supraclavicular lymph nodes the larger 0.65 cm, also left cervical adenopathy largest measuring 1.02 cm; there is also a 1.2 cm lesion in the left hepatic lobe, and a sclerotic lesion in the mid thoracic spine at T7  (c) PET scan 07/21/2020 shows regional adenopathy (including 2 right subcentimeter supraclavicular lymph nodes and subcentimeter lymph nodes in the right hilar, subcarinal and lateral aortic region), no uptake in liver, and an indeterminate T12 lesion  (d) thoracic spine MRI 07/27/2020 shows the T12 lesion to be likely benign, there is also degenerative disc disease  (1) status post total thyroidectomy and central compartment lymph node biopsy 04/12/2019 for a pT1a pN0 medullary thyroid carcinoma, with negative margins, no angioinvasion, and no extrathyroidal extension  (a) CEA and calcitonin   (2) genetics testing 10/27/2018 showed   (a) a pathogenic variant in RET called c.1597G>T (p.Gly533cys), associated with MEN2A (medullary thyroid carcinoma, pheochromocytoma, parathyroid adenoma or hyperplasia)  (b) a likely pathogenic variant in CHEK2 c.349A>G (p.Arg117Gly) (increased breast and colon cancer risk and possibly prostate)  (3) neoadjuvant chemotherapy will consist of carboplatin, docetaxel, trastuzumab, and  pertuzumab q. 21 days x 6, starting 07/23/2020  (4) anti-HER2 immunotherapy to continue to total 1 year  (a) echo 07/22/2020 shows an ejection  fraction in the 60-65% range.  (5) definitive surgery to follow  (6) adjuvant radiation  (7) antiestrogens   PLAN:   Kayla Little is here today for follow up and treatment of her stage IIIA triple positive invasive ductal carcinoma.  She is due to proceed with cycle 3 of Docetaxel, Carboplatin, and Trastuzumab.  She is tolerating this well.  Her labs are normal.  She will proceed with therapy today.  She denies any peripheral neuropathy and we are monitoring her closely for this.    Kayla Little is COVID +.  Her isolation period is 3 weeks from symptom onset due to her immunosuppression from chemotherapy.  She understands this.  Her symptoms are mild and she has continued to improve since she first developed them.    Kayla Little has had issues with hypocalcemia, worse after her first cycle of chemotherapy when she became dehydrated.  We are following ionized calcium levels with each treatment.  She is taking calcitonin.  We will follow this.    Due to her issues with dehydration and nausea, she notes she does better when she receives IV fluids on the day she receives her GCSF injection.  I have added this on for Saturday.    We will see Kayla Little back on Saturday for her injection and IV fluids.  We will see her again in 3 weeks for her next treatment.  She knows to call for any questions that may arise between now and her next appointment.  We are happy to see her sooner if needed.  Total encounter time: 40 minutes* in face to face visit time, chart review, lab review, coordination of care, and documentation of the encounter.   Wilber Bihari, NP 09/04/20 8:31 AM Medical Oncology and Hematology [facility] [place], [place] [address] Tel. [phone]    Fax. [phone]    *Total Encounter Time as defined by the Centers for Medicare and Medicaid Services includes, in addition to the face-to-face time of a patient visit (documented in the note above) non-face-to-face time:  obtaining and reviewing outside history, ordering and reviewing medications, tests or procedures, care coordination (communications with other health care professionals or caregivers) and documentation in the medical record.

## 2020-09-04 NOTE — Patient Instructions (Signed)
[facility] ONCOLOG[facility]  Discharge Instructions: Thank you for choosing [facility] to provide your oncology and hematology care.   If you have a lab appointment with the [facility], please go directly to the [facility] and check in at the registration area.   Wear comfortable clothing and clothing appropriate for easy access to any Portacath or PICC line.   We strive to give you quality time with your provider. You may need to reschedule your appointment if you arrive late (15 or more minutes).  Arriving late affects you and other patients whose appointments are after yours.  Also, if you miss three or more appointments without notifying the office, you may be dismissed from the clinic at the provider's discretion.      For prescription refill requests, have your pharmacy contact our office and allow 72 hours for refills to be completed.    Today you received the following chemotherapy and/or immunotherapy agents: Herceptin, docetaxel, carboplatin.     To help prevent nausea and vomiting after your treatment, we encourage you to take your nausea medication as directed.  BELOW ARE SYMPTOMS THAT SHOULD BE REPORTED IMMEDIATELY: *FEVER GREATER THAN 100.4 F (38 C) OR HIGHER *CHILLS OR SWEATING *NAUSEA AND VOMITING THAT IS NOT CONTROLLED WITH YOUR NAUSEA MEDICATION *UNUSUAL SHORTNESS OF BREATH *UNUSUAL BRUISING OR BLEEDING *URINARY PROBLEMS (pain or burning when urinating, or frequent urination) *BOWEL PROBLEMS (unusual diarrhea, constipation, pain near the anus) TENDERNESS IN MOUTH AND THROAT WITH OR WITHOUT PRESENCE OF ULCERS (sore throat, sores in mouth, or a toothache) UNUSUAL RASH, SWELLING OR PAIN  UNUSUAL VAGINAL DISCHARGE OR ITCHING   Items with * indicate a potential emergency and should be followed up as soon as possible or go to the Emergency Department if any problems should occur.  Please show the CHEMOTHERAPY ALERT CARD or IMMUNOTHERAPY  ALERT CARD at check-in to the Emergency Department and triage nurse.  Should you have questions after your visit or need to cancel or reschedule your appointment, please contact [facility]  Dept: [phone]  and follow the prompts.  Office hours are 8:00 a.m. to 4:30 p.m. Monday - Friday. Please note that voicemails left after 4:00 p.m. may not be returned until the following business day.  We are closed weekends and major holidays. You have access to a nurse at all times for urgent questions. Please call the main number to the clinic Dept: [phone] and follow the prompts.   For any non-urgent questions, you may also contact your provider using MyChart. We now offer e-Visits for anyone 75 and older to request care online for non-urgent symptoms. For details visit [email or web address].   Also download the MyChart app! Go to the app store, search "MyChart", open the app, select [facility], and log in with your MyChart username and password.  Due to Covid, a mask is required upon entering the hospital/clinic. If you do not have a mask, one will be given to you upon arrival. For doctor visits, patients may have 1 support person aged 49 or older with them. For treatment visits, patients cannot have anyone with them due to current Covid guidelines and our immunocompromised population.

## 2020-09-05 ENCOUNTER — Encounter: Payer: Self-pay | Admitting: Oncology

## 2020-09-05 ENCOUNTER — Telehealth: Payer: Self-pay

## 2020-09-05 ENCOUNTER — Encounter: Payer: Self-pay | Admitting: Adult Health

## 2020-09-05 NOTE — Telephone Encounter (Signed)
This LPN attempted to call and check on pt, as she was ordered to have IVF 09/06/20; however, due to staffing we are unable to accommodate. LVM for pt to cal [facility].

## 2020-09-06 ENCOUNTER — Inpatient Hospital Stay: Payer: 59 | Attending: Oncology

## 2020-09-06 ENCOUNTER — Other Ambulatory Visit: Payer: Self-pay

## 2020-09-06 VITALS — BP 117/78 | HR 100 | Temp 98.5°F | Ht 66.0 in

## 2020-09-06 DIAGNOSIS — C7951 Secondary malignant neoplasm of bone: Secondary | ICD-10-CM | POA: Insufficient documentation

## 2020-09-06 DIAGNOSIS — C778 Secondary and unspecified malignant neoplasm of lymph nodes of multiple regions: Secondary | ICD-10-CM | POA: Diagnosis not present

## 2020-09-06 DIAGNOSIS — Z1501 Genetic susceptibility to malignant neoplasm of breast: Secondary | ICD-10-CM | POA: Diagnosis not present

## 2020-09-06 DIAGNOSIS — R5383 Other fatigue: Secondary | ICD-10-CM | POA: Insufficient documentation

## 2020-09-06 DIAGNOSIS — Z5111 Encounter for antineoplastic chemotherapy: Secondary | ICD-10-CM | POA: Diagnosis present

## 2020-09-06 DIAGNOSIS — Z148 Genetic carrier of other disease: Secondary | ICD-10-CM | POA: Insufficient documentation

## 2020-09-06 DIAGNOSIS — Z85850 Personal history of malignant neoplasm of thyroid: Secondary | ICD-10-CM | POA: Insufficient documentation

## 2020-09-06 DIAGNOSIS — C50211 Malignant neoplasm of upper-inner quadrant of right female breast: Secondary | ICD-10-CM | POA: Diagnosis present

## 2020-09-06 DIAGNOSIS — Z170 Estrogen receptor positive status [ER+]: Secondary | ICD-10-CM | POA: Diagnosis not present

## 2020-09-06 DIAGNOSIS — F32A Depression, unspecified: Secondary | ICD-10-CM | POA: Insufficient documentation

## 2020-09-06 DIAGNOSIS — Z79899 Other long term (current) drug therapy: Secondary | ICD-10-CM | POA: Insufficient documentation

## 2020-09-06 DIAGNOSIS — Z5189 Encounter for other specified aftercare: Secondary | ICD-10-CM | POA: Insufficient documentation

## 2020-09-06 DIAGNOSIS — Z1509 Genetic susceptibility to other malignant neoplasm: Secondary | ICD-10-CM | POA: Diagnosis not present

## 2020-09-06 MED ORDER — PEGFILGRASTIM-BMEZ 6 MG/0.6ML ~~LOC~~ SOSY
6.0000 mg | PREFILLED_SYRINGE | Freq: Once | SUBCUTANEOUS | Status: AC
  Administered 2020-09-06: 6 mg via SUBCUTANEOUS

## 2020-09-06 NOTE — Patient Instructions (Signed)
Filgrastim, G-CSF injection What is this medication? FILGRASTIM, G-CSF (fil GRA stim) is a granulocyte colony-stimulating factor that stimulates the growth of neutrophils, a type of white blood cell (WBC) important in the body's fight against infection. It is used to reduce the incidence of fever and infection in patients with certain types of cancer who are receiving chemotherapy that affects the bone marrow, to stimulate blood cell production for removal of WBCs from the body prior to a bone marrow transplantation, to reduce the incidence of fever and infection in patients who have severe chronic neutropenia, and to improve survival outcomes following high-dose radiation exposure that is toxic to the bone marrow. This medicine may be used for other purposes; ask your health care provider or pharmacist if you have questions. COMMON BRAND NAME(S): Neupogen, Nivestym, Releuko, Zarxio What should I tell my care team before I take this medication? They need to know if you have any of these conditions: kidney disease latex allergy ongoing radiation therapy sickle cell disease an unusual or allergic reaction to filgrastim, pegfilgrastim, other medicines, foods, dyes, or preservatives pregnant or trying to get pregnant breast-feeding How should I use this medication? This medicine is for injection under the skin or infusion into a vein. As an infusion into a vein, it is usually given by a health care professional in a hospital or clinic setting. If you get this medicine at home, you will be taught how to prepare and give this medicine. Refer to the Instructions for Use that come with your medication packaging. Use exactly as directed. Take your medicine at regular intervals. Do not take your medicine more often than directed. It is important that you put your used needles and syringes in a special sharps container. Do not put them in a trash can. If you do not have a sharps container, call your pharmacist  or healthcare provider to get one. Talk to your pediatrician regarding the use of this medicine in children. While this drug may be prescribed for children as young as 7 months for selected conditions, precautions do apply. Overdosage: If you think you have taken too much of this medicine contact a poison control center or emergency room at once. NOTE: This medicine is only for you. Do not share this medicine with others. What if I miss a dose? It is important not to miss your dose. Call your doctor or health care professional if you miss a dose. What may interact with this medication? This medicine may interact with the following medications: medicines that may cause a release of neutrophils, such as lithium This list may not describe all possible interactions. Give your health care provider a list of all the medicines, herbs, non-prescription drugs, or dietary supplements you use. Also tell them if you smoke, drink alcohol, or use illegal drugs. Some items may interact with your medicine. What should I watch for while using this medication? Your condition will be monitored carefully while you are receiving this medicine. You may need blood work done while you are taking this medicine. Talk to your health care provider about your risk of cancer. You may be more at risk for certain types of cancer if you take this medicine. What side effects may I notice from receiving this medication? Side effects that you should report to your doctor or health care professional as soon as possible: allergic reactions like skin rash, itching or hives, swelling of the face, lips, or tongue back pain dizziness or feeling faint fever pain, redness, or   irritation at site where injected pinpoint red spots on the skin shortness of breath or breathing problems signs and symptoms of kidney injury like trouble passing urine, change in the amount of urine, or red or dark-brown urine stomach or side pain, or pain at  the shoulder swelling tiredness unusual bleeding or bruising Side effects that usually do not require medical attention (report to your doctor or health care professional if they continue or are bothersome): bone pain cough diarrhea hair loss headache muscle pain This list may not describe all possible side effects. Call your doctor for medical advice about side effects. You may report side effects to FDA at 1-800-FDA-1088. Where should I keep my medication? Keep out of the reach of children. Store in a refrigerator between 2 and 8 degrees C (36 and 46 degrees F). Do not freeze. Keep in carton to protect from light. Throw away this medicine if vials or syringes are left out of the refrigerator for more than 24 hours. Throw away any unused medicine after the expiration date. NOTE: This sheet is a summary. It may not cover all possible information. If you have questions about this medicine, talk to your doctor, pharmacist, or health care provider.  2022 [employer] (2019-03-15 18:47:55)  

## 2020-09-07 LAB — CALCIUM, IONIZED: Calcium, Ionized, Serum: 4.5 mg/dL (ref 4.5–5.6)

## 2020-09-09 ENCOUNTER — Ambulatory Visit (INDEPENDENT_AMBULATORY_CARE_PROVIDER_SITE_OTHER): Payer: 59 | Admitting: Plastic Surgery

## 2020-09-09 ENCOUNTER — Encounter: Payer: Self-pay | Admitting: Plastic Surgery

## 2020-09-09 ENCOUNTER — Other Ambulatory Visit: Payer: Self-pay

## 2020-09-09 DIAGNOSIS — Z1589 Genetic susceptibility to other disease: Secondary | ICD-10-CM

## 2020-09-09 DIAGNOSIS — E3122 Multiple endocrine neoplasia [MEN] type IIA: Secondary | ICD-10-CM

## 2020-09-09 DIAGNOSIS — Z170 Estrogen receptor positive status [ER+]: Secondary | ICD-10-CM

## 2020-09-09 DIAGNOSIS — Z1501 Genetic susceptibility to malignant neoplasm of breast: Secondary | ICD-10-CM

## 2020-09-09 DIAGNOSIS — C50211 Malignant neoplasm of upper-inner quadrant of right female breast: Secondary | ICD-10-CM

## 2020-09-09 DIAGNOSIS — Z1502 Genetic susceptibility to malignant neoplasm of ovary: Secondary | ICD-10-CM

## 2020-09-09 DIAGNOSIS — Z1509 Genetic susceptibility to other malignant neoplasm: Secondary | ICD-10-CM

## 2020-09-09 NOTE — Progress Notes (Signed)
   Subjective:    Patient ID: Kayla Little, female    DOB: 22-Jan-1986, 35 y.o.   MRN: [id number]  The patient is a 35 year old very sweet lady here for further discussion about her breast cancer reconstruction.  She still has chemotherapy to do until September 1.  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  She is estrogen and progesterone positive and HER2 positive.  She also had some genetics that were positive and MEN 2A.  She is planning on undergoing bilateral mastectomies.  She is 5 feet 6 inches tall and weighs around 163 pounds.  Her preoperative bra size is a 36 B/C. She does not want to have autologous reconstruction.   Review of Systems  Constitutional: Negative.   HENT: Negative.    Eyes: Negative.   Respiratory: Negative.    Cardiovascular: Negative.   Gastrointestinal: Negative.   Endocrine: Negative.   Genitourinary: Negative.   Neurological: Negative.       Objective:   Physical Exam Vitals and nursing note reviewed.  Constitutional:      Appearance: Normal appearance.  HENT:     Head: Normocephalic and atraumatic.  Cardiovascular:     Rate and Rhythm: Normal rate.     Pulses: Normal pulses.  Pulmonary:     Effort: Pulmonary effort is normal.  Abdominal:     General: Abdomen is flat.  Skin:    General: Skin is warm.     Coloration: Skin is not jaundiced.     Findings: No bruising.  Neurological:     Mental Status: She is alert and oriented to person, place, and time.  Psychiatric:        Mood and Affect: Mood normal.        Behavior: Behavior normal.        Thought Content: Thought content normal.       Assessment & Plan:     ICD-10-CM   1. Monoallelic mutation of CHEK2 gene in female patient  Z15.01    Z15.89    Z15.09    Z15.02     2. Malignant neoplasm of upper-inner quadrant of right breast in female,  estrogen receptor positive ([facility])  C50.211    Z17.0     3. MEN 2A (multiple endocrine neoplasia, type 2A) ([facility])  E31.22       We had a long discussion about her options.  She had a list and we went through that.  I reassured her that we are here to assist in any way we can.  If for some reason she decides not to have reconstruction that is okay and we support her in that.  Right now she is planning on bilateral mastectomies with immediate reconstruction with expanders and Flex HD.  We will try to get her exchanged quickly as radiation is likely on the treatment plan.  The risks that can be encountered with and after placement of a breast expander placement were discussed:  infection, delayed healing, anesthesia risks, skin sensation changes, skin contour irregularities, swelling, deep vein thrombosis, pain, wrinkling of the skin over the expander, changes in nipple or breast sensation and possible need for revisional surgery or staged procedures.

## 2020-09-18 NOTE — Progress Notes (Signed)
[place]  [phone] [phone]     ID: Kayla Little DOB: 1985/04/25  MR#: [id number]  HAL#:[id number]  Patient Care Team: [name] as PCP - General (Family Medicine) Magrinat, Virgie Dad, MD as Consulting Physician (Oncology) Kyung Rudd, MD as Consulting Physician (Radiation Oncology) Armandina Gemma, MD as Consulting Physician (General Surgery) Rockwell Germany, RN as Registered Nurse Mauro Kaufmann, RN as Registered Nurse Scot Dock, NP OTHER MD:  CHIEF COMPLAINT: Triple positive locally advanced breast cancer  CURRENT TREATMENT: Neoadjuvant chemotherapy   INTERVAL HISTORY: Kayla Little returns today for follow up and treatment of her locally advanced breast cancer. She was evaluated in the breast cancer clinic on 07/31/2020.  She is here today to receive cycle 4 day 1 of Docetaxel, carboplatin and Trastuzumab.  She was receiving Pertuzmab, however that was discontinued after cycle 1 due to diarrhea and dehydration.     Kayla Little is feeling well.  She has occasional fatigue, but she rests when her body tells her to.  She notes she is increasingly fatigued the first three to four days after chemotherapy and then her energy rebounds as well as her activity level.  She denies any new issues today.     REVIEW OF SYSTEMS: Review of Systems  Constitutional:  Negative for appetite change, chills, fatigue, fever and unexpected weight change.  HENT:   Negative for hearing loss, lump/mass and trouble swallowing.   Eyes:  Negative for eye problems and icterus.  Respiratory:  Negative for chest tightness, cough and shortness of breath.   Cardiovascular:  Negative for chest pain, leg swelling and palpitations.  Gastrointestinal:  Negative for abdominal distention, abdominal pain, constipation, diarrhea, nausea and vomiting.  Endocrine: Negative for hot flashes.  Genitourinary:  Negative for difficulty urinating.    Musculoskeletal:  Negative for arthralgias.  Skin:  Negative for itching and rash.  Neurological:  Negative for dizziness, extremity weakness, headaches and numbness.  Hematological:  Negative for adenopathy. Does not bruise/bleed easily.  Psychiatric/Behavioral:  Negative for depression. The patient is not nervous/anxious.      COVID 19 VACCINATION STATUS: Status post Moderna x2 followed by [employer] booster   HISTORY OF CURRENT ILLNESS: From the original intake note:  Kayla Pro "[name]" (the daughter of my patient, Kayla Little, whom I saw earlier today). has a history of medullary thyroid cancer, status post total thyroidectomy on 04/12/2019 under Dr. Harlow Asa.   More recently she presented to her GYN with a right supraclavicular lump, prompting neck and chest CT on 06/24/2020. Neck CT showed: no recurrent mass in thyroid bed, palpable abnormality corresponds to a cluster of 2 lymph nodes in right supraclavicular region; prominent lym ph nodes in neck bilaterally. Chest CT showed: enhancing 1.4 cm lesion in medial right breast with associated mildly enlarged right axillary lymph nodes; 1.2 cm lesion in left hepatic lobe with central hyperattenuation; enlargement of right infrahilar and right hilar nodal tissue is borderline suspicious; sclerotic lesion with spiculated margins at T7 level favored to represent a bone island.  She underwent bilateral diagnostic mammography with tomography and bilateral breast ultrasonography at [facility] on 06/27/2020 showing: breast density category D; Right Breast 1. 2.6 cm irregular mass in inner breast, with associated microcalcifications spanning up to 5.1 cm, corresponding to chest CT finding. 2. Adjacent 0.9 cm mass in inner breast, 2 cm away from dominant mass. 3. Two indeterminate masses in outer breast. 4. At least 3 enlarged lymph nodes in right  axilla  Left Breast 1. Indeterminate 1.2 cm mass in lower-outer breast, possibly a fibroadenoma 2. No enlarged  lymphadenopathy in left axilla.  Accordingly on 06/30/2020 she proceeded to biopsy of the right breast area in question. The pathology from this procedure ([id number]) showed:  1. Dominant mass  - invasive ductal carcinoma, grade 1  - ductal carcinoma in situ  - Prognostic indicators significant for: estrogen receptor, 90% positive and progesterone receptor, 80% positive, both with moderate staining intensity.  HER2 equivocal by immunohistochemistry (2+), but positive by FISH, with a signals ratio of 2.2 and the number per cell greater than 5.. 2. Smaller mass  - invasive ductal carcinoma, grade 1  - ductal carcinoma in situ  - Prognostic indicators significant for: estrogen receptor, 100% positive with strong staining intensity and progesterone receptor, 50% positive with moderate staining intensity.  HER2 positive by immunohistochemistry (3+). 3. Right axillary lymph node  - invasive ductal carcinoma, grade 1 Prognostic indicators significant for: estrogen receptor, 100% positive and progesterone receptor, 90% positive, both with strong staining intensity. HER2 equivocal by immunohistochemistry (2+), {but (positive or negative) by fluorescent in situ hybridization   Biopsy of the left breast area was performed the same day. Pathology from the procedure ([id number]) was benign.  She underwent breast MRI on 07/06/2020 showing: breast composition C; enhancing mass with adjacent non-mass enhancement in lower-inner right breast, which together measure 5.4 cm, corresponding with two biopsy-proven areas of malignancy; no other suspicious enhancement identified in right breast; one 4 mm right internal mammary lymph node with unclear significance; numerous abnormal right axillary lymph nodes; no evidence of left breast malignancy.  Cancer Staging Malignant neoplasm of upper-inner quadrant of right breast in female, estrogen receptor positive ([place]) Staging form: Breast, AJCC 8th Edition - Clinical stage  from 07/08/2020: Stage IIIA (cT3, cN3, cM0, G1, ER+, PR+, HER2+) - Unsigned Stage prefix: Initial diagnosis Histologic grading system: 3 grade system  The patient's subsequent history is as detailed below.   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)    Hx of varicella    Kidney stone 2012   Medullary thyroid carcinoma ([place])    Monoallelic mutation of CHEK2 gene in female patient    Postpartum care following vaginal delivery (7/27) 10/02/2014, June 16 , 2020   Thyroid cancer [facility]) 2021    PAST SURGICAL HISTORY: Past Surgical History:  Procedure Laterality Date   IR IMAGING GUIDED PORT INSERTION  07/22/2020   NO PAST SURGERIES     THYROIDECTOMY N/A 04/12/2019   Procedure: TOTAL THYROIDECTOMY WITH LIMITED LYMPH NODE DISSECTION;  Surgeon: Armandina Gemma, MD;  Location: [facility];  Service: General;  Laterality: N/A;   UNILATERAL SALPINGECTOMY Right 07/09/2017   Procedure: RIGHT SALPINGECTOMY WITH REMOVAL OF ECTOPIC PREGNANCY;  Surgeon: Azucena Fallen, MD;  Location: [facility];  Service: Gynecology;  Laterality: Right;    FAMILY HISTORY: Family History  Problem Relation Age of Onset   Breast cancer Mother 7       2 types of cancer one side mastectomy with chemo   Endometriosis Mother    Endometriosis Maternal Grandmother  Breast cancer Maternal Grandmother        post menopausal   Diabetes Maternal Grandmother    Cancer Maternal Grandfather        prostate and lymphoma   Alzheimer's disease Paternal Grandmother    Cancer Father 28       prostate cancer   Aneurysm Paternal Grandfather        d. 46s   Please see genetic counseling note dated 11/23/2018 for  full family information.   GYNECOLOGIC HISTORY:  No LMP recorded. (Menstrual status: IUD). Menarche: 35 years old Age at first live birth: 35 years old [place] P 2 Patient has a Mirena IUD in place HRT n/a  Hysterectomy? no BSO?  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  Her husband Barnabas Lister works for [employer].  Their children are Dylan 5 and Emma 2.  Barnabas Lister has a 35 year old daughter, the patient's stepdaughter Hildred Alamin, currently attending [employer].  The patient is a [employer]    ADVANCED DIRECTIVES: In the absence of any documents to the contrary the patient's husband is her healthcare power of attorney   HEALTH MAINTENANCE: Social History   Tobacco Use   Smoking status: Some Days    Packs/day: 0.50    Years: 5.00    Pack years: 2.50    Types: Cigarettes   Smokeless tobacco: Never   Tobacco comments:    Actively trying to quit.  Vaping Use   Vaping Use: Never used  Substance Use Topics   Alcohol use: Yes    Alcohol/week: 7.0 - 14.0 standard drinks    Types: 7 - 14 Glasses of wine per week    Comment: glass of wine each night    Drug use: Not Currently    Types: Marijuana    Comment: not during pregnancy, last use was 03/27/2019     Colonoscopy: n/a (age)  PAP:   Bone density: n/a (age)   No Known Allergies  Current Outpatient Medications  Medication Sig Dispense Refill   acetaminophen (TYLENOL) 325 MG tablet Take 2 tablets (650 mg total) by mouth every 4 (four) hours as needed (for pain scale < 4). 100 tablet 0   calcitRIOL (ROCALTROL) 0.5 MCG capsule Take 1 capsule (0.5 mcg total) by mouth 2 (two) times a week. 30 capsule 1   dexamethasone (DECADRON) 4 MG tablet Take 2 tablets (8 mg total) by mouth 2 (two) times daily. Start the day before Taxotere. Then take daily x 3 days after chemotherapy. 30 tablet 1   doxycycline (VIBRA-TABS) 100 MG tablet Take 1 tablet  (100 mg total) by mouth daily. (Patient not taking: Reported on 09/09/2020) 90 tablet 0   levonorgestrel (MIRENA) 20 MCG/24HR IUD 1 each by Intrauterine route once.     levothyroxine (SYNTHROID) 175 MCG tablet Take 175 mcg by mouth daily before breakfast.     lidocaine-prilocaine (EMLA) cream Apply to affected area once (Patient taking differently: Apply 1 application topically as needed (port access).) 30 g 3   liothyronine (CYTOMEL) 5 MCG tablet Take 5 mcg by mouth daily.     loperamide (IMODIUM) 2 MG capsule Take 2 mg by mouth as needed for diarrhea or loose stools.     loratadine (CLARITIN) 10 MG tablet Take 1 tablet (10 mg total) by mouth daily. 90 tablet 0   LORazepam (ATIVAN) 0.5 MG tablet Take 1 tablet (0.5 mg total) by mouth every 12 (twelve) hours  as needed for anxiety. 30 tablet 0   ondansetron (ZOFRAN) 8 MG tablet Take 1 tablet (8 mg total) by mouth 2 (two) times daily as needed (Nausea or vomiting). Start on the third day after chemotherapy. (Patient not taking: Reported on 09/09/2020) 30 tablet 1   prochlorperazine (COMPAZINE) 10 MG tablet Take 1 tablet (10 mg total) by mouth every 6 (six) hours as needed (Nausea or vomiting). 30 tablet 1   sertraline (ZOLOFT) 50 MG tablet Take 50 mg by mouth at bedtime.     valACYclovir (VALTREX) 500 MG tablet Take 1 tablet (500 mg total) by mouth daily. 90 tablet 0   VYVANSE 40 MG capsule Take 40 mg by mouth every morning. (Patient not taking: Reported on 09/09/2020)     VYVANSE 60 MG capsule Take 60 mg by mouth every morning.     No current facility-administered medications for this visit.    OBJECTIVE: White woman who appears stated age Examined in chemo room.  See [facility] for vitals Vitals:   09/25/20 1009  BP: 136/78  Pulse: (!) 112  Resp: 18  Temp: 98.1 F (36.7 C)  SpO2: 100%      Body mass index is 25.86 kg/m.   Wt Readings from Last 3 Encounters:  09/25/20 160 lb 3.2 oz (72.7 kg)  09/04/20 161 lb (73 kg)  08/13/20 160 lb 8 oz (72.8 kg)   ECOG FS:1 - Symptomatic but completely ambulatory GENERAL: Patient is a well appearing female in no acute distress HEENT:  Sclerae anicteric.  Oropharynx clear and moist. No ulcerations or evidence of oropharyngeal candidiasis. Neck is supple.  NODES:  No cervical, supraclavicular, or axillary lymphadenopathy palpated.  BREAST EXAM:  Deferred. LUNGS:  Clear to auscultation bilaterally.  No wheezes or rhonchi. HEART:  Regular rate and rhythm. No murmur appreciated. ABDOMEN:  Soft, nontender.  Positive, normoactive bowel sounds. No organomegaly palpated. MSK:  No focal spinal tenderness to palpation. Full range of motion bilaterally in the upper extremities. EXTREMITIES:  No peripheral edema.   SKIN:  Clear with no obvious rashes or skin changes. No nail dyscrasia. NEURO:  Nonfocal. Well oriented.  Appropriate affect.    LAB RESULTS:  CMP     Component Value Date/Time   NA 139 09/25/2020 1039   NA 142 02/16/2017 1332   K 3.8 09/25/2020 1039   CL 107 09/25/2020 1039   CO2 24 09/25/2020 1039   GLUCOSE 148 (H) 09/25/2020 1039   BUN 15 09/25/2020 1039   BUN 9 02/16/2017 1332   CREATININE 0.55 09/25/2020 1039   CREATININE 0.82 12/04/2012 1732   CALCIUM 8.8 (L) 09/25/2020 1039   PROT 7.4 09/25/2020 1039   PROT 6.5 02/16/2017 1332   ALBUMIN 4.5 09/25/2020 1039   ALBUMIN 4.3 02/16/2017 1332   AST 20 09/25/2020 1039   ALT 27 09/25/2020 1039   ALKPHOS 59 09/25/2020 1039   BILITOT 0.7 09/25/2020 1039   GFRNONAA >60 09/25/2020 1039   GFRAA >60 04/15/2019 2145    No results found for: TOTALPROTELP, ALBUMINELP, A1GS, A2GS, BETS, BETA2SER, GAMS, MSPIKE, SPEI  Lab Results  Component Value Date   WBC 8.7 09/25/2020   NEUTROABS 6.9 09/25/2020   HGB 10.6 (L) 09/25/2020   HCT 31.7 (L) 09/25/2020   MCV 100.3 (H) 09/25/2020   PLT 315 09/25/2020    No results found for: LABCA2  No components found for: LJQGBE010  No results for input(s): INR in the last 168 hours.  No results  found for: LABCA2  No results found for: CAN199  No results found for: CAN125  No results found for: BRA309  Lab Results  Component Value Date   CA2729 15.9 07/30/2020    No components found for: HGQUANT  Lab Results  Component Value Date   CEA1 5.17 (H) 07/30/2020   /  CEA ([facility])  Date Value Ref Range Status  07/30/2020 5.17 (H) 0.00 - 5.00 ng/mL Final    Comment:    (NOTE) This test was performed using Architect's Chemiluminescent Microparticle Immunoassay. Values obtained from different assay methods cannot be used interchangeably. Please note that 5-10% of patients who smoke may see CEA levels up to 6.9 ng/mL. Performed at [facility], [place] [address] [place], [place] [address]      No results found for: AFPTUMOR  No results found for: CHROMOGRNA  No results found for: KPAFRELGTCHN, LAMBDASER, KAPLAMBRATIO (kappa/lambda light chains)  No results found for: HGBA, HGBA2QUANT, HGBFQUANT, HGBSQUAN (Hemoglobinopathy evaluation)   No results found for: LDH  No results found for: IRON, TIBC, IRONPCTSAT (Iron and TIBC)  No results found for: FERRITIN  Urinalysis    Component Value Date/Time   COLORURINE YELLOW 07/31/2020 [place] 07/31/2020 0558   LABSPEC 1.005 07/31/2020 0558   PHURINE 6.0 07/31/2020 [place] 07/31/2020 0558   HGBUR NEGATIVE 07/31/2020 0558   BILIRUBINUR NEGATIVE 07/31/2020 0558   BILIRUBINUR small (A) 02/16/2017 1216   BILIRUBINUR small 12/04/2012 [place] 07/31/2020 0558   PROTEINUR NEGATIVE 07/31/2020 0558   UROBILINOGEN 0.2 02/16/2017 1216   UROBILINOGEN 0.2 09/18/2013 1555   NITRITE NEGATIVE 07/31/2020 0558   LEUKOCYTESUR NEGATIVE 07/31/2020 0558    STUDIES: No results found.    ELIGIBLE FOR AVAILABLE RESEARCH PROTOCOL:   ASSESSMENT: 35 y.o. [place] woman with a clinical mT2 N3, stage IIIA invasive ductal carcinoma, grade 1, triple  positive  (a) breast MRI 07/06/2020 finds a 1.6 cm lower inner quadrant mass associated with 5.4 cm of non-mass-like enhancement, at least 8 enlarged right axillary lymph node and 1 right internal mammary lymph node  (b) CT scans of the neck and chest 06/24/2020 shows subcentimeter right cervical adenopathy, 2 right supraclavicular lymph nodes the larger 0.65 cm, also left cervical adenopathy largest measuring 1.02 cm; there is also a 1.2 cm lesion in the left hepatic lobe, and a sclerotic lesion in the mid thoracic spine at T7  (c) PET scan 07/21/2020 shows regional adenopathy (including 2 right subcentimeter supraclavicular lymph nodes and subcentimeter lymph nodes in the right hilar, subcarinal and lateral aortic region), no uptake in liver, and an indeterminate T12 lesion  (d) thoracic spine MRI 07/27/2020 shows the T12 lesion to be likely benign, there is also degenerative disc disease  (1) status post total thyroidectomy and central compartment lymph node biopsy 04/12/2019 for a pT1a pN0 medullary thyroid carcinoma, with negative margins, no angioinvasion, and no extrathyroidal extension  (a) CEA and calcitonin   (2) genetics testing 10/27/2018 showed   (a) 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 21 days x 6, starting 07/23/2020  (4) anti-HER2 immunotherapy to continue to total 1 year  (a) echo 07/22/2020 shows an ejection fraction in the 60-65% range.  (5) definitive  surgery to follow  (6) adjuvant radiation  (7) antiestrogens   PLAN:   Kayla Little is here today prior to receiving her fourth cycle of neoadjuvant chemotherapy.  She is  tolerating her treatment well.  Her labs have remained stable and I reviewed those with her in detail.  She denies any peripheral neuropathy.    Kayla Little does have some fatigue that seems to occur directly after chemotherapy.  This is a normal side effect and she is managing this appropriately.   I sent in refills for her Prochlorperazine, Dexamethasone, and Valtrex today.   Kayla Little would like to receive IV fluids when she returns on Saturday.  I signed and held one liter of normal saline for Saturday when she returns to receive her Neulasta/neulasta biosimilar.    We will see her back in 3 weeks for labs, f/u and her next infusion.  She knows to call for any questions that may arise between now and her next appointment.  We are happy to see her sooner if needed.   Total encounter time: 20 minutes* in face to face visit time, chart review, lab review, coordination of care, and documentation of the encounter.   Wilber Bihari, NP 09/25/20 12:23 PM Medical Oncology and Hematology [facility] [place], [place] [address] Tel. [phone]    Fax. [phone]    *Total Encounter Time as defined by the Centers for Medicare and Medicaid Services includes, in addition to the face-to-face time of a patient visit (documented in the note above) non-face-to-face time: obtaining and reviewing outside history, ordering and reviewing medications, tests or procedures, care coordination (communications with other health care professionals or caregivers) and documentation in the medical record.

## 2020-09-22 ENCOUNTER — Other Ambulatory Visit: Payer: Self-pay | Admitting: Oncology

## 2020-09-22 DIAGNOSIS — Z170 Estrogen receptor positive status [ER+]: Secondary | ICD-10-CM

## 2020-09-22 DIAGNOSIS — C50211 Malignant neoplasm of upper-inner quadrant of right female breast: Secondary | ICD-10-CM

## 2020-09-25 ENCOUNTER — Inpatient Hospital Stay: Payer: 59

## 2020-09-25 ENCOUNTER — Telehealth: Payer: Self-pay

## 2020-09-25 ENCOUNTER — Encounter: Payer: Self-pay | Admitting: Adult Health

## 2020-09-25 ENCOUNTER — Encounter: Payer: Self-pay | Admitting: Oncology

## 2020-09-25 ENCOUNTER — Other Ambulatory Visit: Payer: Self-pay | Admitting: Adult Health

## 2020-09-25 ENCOUNTER — Other Ambulatory Visit: Payer: Self-pay

## 2020-09-25 ENCOUNTER — Inpatient Hospital Stay (HOSPITAL_BASED_OUTPATIENT_CLINIC_OR_DEPARTMENT_OTHER): Payer: 59 | Admitting: Adult Health

## 2020-09-25 VITALS — BP 136/78 | HR 112 | Temp 98.1°F | Resp 18 | Ht 66.0 in | Wt 160.2 lb

## 2020-09-25 VITALS — HR 103

## 2020-09-25 DIAGNOSIS — E3122 Multiple endocrine neoplasia [MEN] type IIA: Secondary | ICD-10-CM

## 2020-09-25 DIAGNOSIS — Z170 Estrogen receptor positive status [ER+]: Secondary | ICD-10-CM

## 2020-09-25 DIAGNOSIS — C50211 Malignant neoplasm of upper-inner quadrant of right female breast: Secondary | ICD-10-CM

## 2020-09-25 DIAGNOSIS — Z5111 Encounter for antineoplastic chemotherapy: Secondary | ICD-10-CM | POA: Diagnosis not present

## 2020-09-25 DIAGNOSIS — Z95828 Presence of other vascular implants and grafts: Secondary | ICD-10-CM

## 2020-09-25 LAB — CBC WITH DIFFERENTIAL/PLATELET
Abs Immature Granulocytes: 0.07 10*3/uL (ref 0.00–0.07)
Basophils Absolute: 0 10*3/uL (ref 0.0–0.1)
Basophils Relative: 0 %
Eosinophils Absolute: 0 10*3/uL (ref 0.0–0.5)
Eosinophils Relative: 0 %
HCT: 31.7 % — ABNORMAL LOW (ref 36.0–46.0)
Hemoglobin: 10.6 g/dL — ABNORMAL LOW (ref 12.0–15.0)
Immature Granulocytes: 1 %
Lymphocytes Relative: 8 %
Lymphs Abs: 0.7 10*3/uL (ref 0.7–4.0)
MCH: 33.5 pg (ref 26.0–34.0)
MCHC: 33.4 g/dL (ref 30.0–36.0)
MCV: 100.3 fL — ABNORMAL HIGH (ref 80.0–100.0)
Monocytes Absolute: 1 10*3/uL (ref 0.1–1.0)
Monocytes Relative: 11 %
Neutro Abs: 6.9 10*3/uL (ref 1.7–7.7)
Neutrophils Relative %: 80 %
Platelets: 315 10*3/uL (ref 150–400)
RBC: 3.16 MIL/uL — ABNORMAL LOW (ref 3.87–5.11)
RDW: 15.9 % — ABNORMAL HIGH (ref 11.5–15.5)
WBC: 8.7 10*3/uL (ref 4.0–10.5)
nRBC: 0 % (ref 0.0–0.2)

## 2020-09-25 LAB — CMP (CANCER CENTER ONLY)
ALT: 27 U/L (ref 0–44)
AST: 20 U/L (ref 15–41)
Albumin: 4.5 g/dL (ref 3.5–5.0)
Alkaline Phosphatase: 59 U/L (ref 38–126)
Anion gap: 8 (ref 5–15)
BUN: 15 mg/dL (ref 6–20)
CO2: 24 mmol/L (ref 22–32)
Calcium: 8.8 mg/dL — ABNORMAL LOW (ref 8.9–10.3)
Chloride: 107 mmol/L (ref 98–111)
Creatinine: 0.55 mg/dL (ref 0.44–1.00)
GFR, Estimated: >60 mL/min (ref >60)
Glucose, Bld: 148 mg/dL — ABNORMAL HIGH (ref 70–99)
Potassium: 3.8 mmol/L (ref 3.5–5.1)
Sodium: 139 mmol/L (ref 135–145)
Total Bilirubin: 0.7 mg/dL (ref 0.3–1.2)
Total Protein: 7.4 g/dL (ref 6.5–8.1)

## 2020-09-25 LAB — PREGNANCY, URINE: Preg Test, Ur: NEGATIVE

## 2020-09-25 MED ORDER — SODIUM CHLORIDE 0.9% FLUSH
10.0000 mL | INTRAVENOUS | Status: AC | PRN
Start: 2020-09-25 — End: 2020-09-25
  Administered 2020-09-25: 10 mL
  Filled 2020-09-25: qty 10

## 2020-09-25 MED ORDER — SODIUM CHLORIDE 0.9% FLUSH
10.0000 mL | INTRAVENOUS | Status: DC | PRN
  Administered 2020-09-25: 10 mL
  Filled 2020-09-25: qty 10

## 2020-09-25 MED ORDER — DIPHENHYDRAMINE HCL 25 MG PO CAPS
ORAL_CAPSULE | ORAL | Status: AC
  Filled 2020-09-25: qty 1

## 2020-09-25 MED ORDER — PALONOSETRON HCL INJECTION 0.25 MG/5ML
0.2500 mg | Freq: Once | INTRAVENOUS | Status: AC
  Administered 2020-09-25: 0.25 mg via INTRAVENOUS

## 2020-09-25 MED ORDER — DEXAMETHASONE 4 MG PO TABS: 8.0000 mg | ORAL_TABLET | Freq: Two times a day (BID) | ORAL | 1 refills | Status: DC

## 2020-09-25 MED ORDER — PROCHLORPERAZINE MALEATE 10 MG PO TABS: 10.0000 mg | ORAL_TABLET | Freq: Four times a day (QID) | ORAL | 1 refills | Status: DC | PRN

## 2020-09-25 MED ORDER — SODIUM CHLORIDE 0.9 % IV SOLN
10.0000 mg | Freq: Once | INTRAVENOUS | Status: AC
  Administered 2020-09-25: 10 mg via INTRAVENOUS
  Filled 2020-09-25: qty 10

## 2020-09-25 MED ORDER — SODIUM CHLORIDE 0.9 % IV SOLN
Freq: Once | INTRAVENOUS | Status: AC
  Filled 2020-09-25: qty 250

## 2020-09-25 MED ORDER — PROCHLORPERAZINE MALEATE 10 MG PO TABS
ORAL_TABLET | ORAL | Status: AC
  Filled 2020-09-25: qty 1

## 2020-09-25 MED ORDER — PALONOSETRON HCL INJECTION 0.25 MG/5ML
INTRAVENOUS | Status: AC
  Filled 2020-09-25: qty 5

## 2020-09-25 MED ORDER — DIPHENHYDRAMINE HCL 25 MG PO CAPS
25.0000 mg | ORAL_CAPSULE | Freq: Once | ORAL | Status: AC
  Administered 2020-09-25: 25 mg via ORAL

## 2020-09-25 MED ORDER — TRASTUZUMAB-ANNS CHEMO 150 MG IV SOLR
450.0000 mg | Freq: Once | INTRAVENOUS | Status: AC
  Administered 2020-09-25: 450 mg via INTRAVENOUS
  Filled 2020-09-25: qty 21.43

## 2020-09-25 MED ORDER — ACETAMINOPHEN 325 MG PO TABS
650.0000 mg | ORAL_TABLET | Freq: Once | ORAL | Status: AC
  Administered 2020-09-25: 650 mg via ORAL

## 2020-09-25 MED ORDER — SODIUM CHLORIDE 0.9 % IV SOLN
690.0000 mg | Freq: Once | INTRAVENOUS | Status: AC
  Administered 2020-09-25: 690 mg via INTRAVENOUS
  Filled 2020-09-25: qty 69

## 2020-09-25 MED ORDER — HEPARIN SOD (PORK) LOCK FLUSH 100 UNIT/ML IV SOLN
500.0000 [IU] | Freq: Once | INTRAVENOUS | Status: AC | PRN
  Administered 2020-09-25: 500 [IU]
  Filled 2020-09-25: qty 5

## 2020-09-25 MED ORDER — SODIUM CHLORIDE 0.9 % IV SOLN
150.0000 mg | Freq: Once | INTRAVENOUS | Status: AC
  Administered 2020-09-25: 150 mg via INTRAVENOUS
  Filled 2020-09-25: qty 150

## 2020-09-25 MED ORDER — SODIUM CHLORIDE 0.9 % IV SOLN
75.0000 mg/m2 | Freq: Once | INTRAVENOUS | Status: AC
  Administered 2020-09-25: 140 mg via INTRAVENOUS
  Filled 2020-09-25: qty 14

## 2020-09-25 MED ORDER — VALACYCLOVIR HCL 500 MG PO TABS: 500.0000 mg | ORAL_TABLET | Freq: Every day | ORAL | 0 refills | Status: DC

## 2020-09-25 MED ORDER — ACETAMINOPHEN 325 MG PO TABS
ORAL_TABLET | ORAL | Status: AC
  Filled 2020-09-25: qty 2

## 2020-09-25 MED ORDER — DIPHENHYDRAMINE HCL 25 MG PO CAPS
ORAL_CAPSULE | ORAL | Status: AC
  Filled 2020-09-25: qty 2

## 2020-09-25 NOTE — Telephone Encounter (Signed)
Attempted to call pt regarding appt scheduled with [place] today. Pt is a [place]/[facility]. Did not answer. LVM asking pt to return call to [facility].

## 2020-09-25 NOTE — Progress Notes (Signed)
Ok to treat with elevated HR per Lindsey Causey, NP. 

## 2020-09-26 ENCOUNTER — Encounter: Payer: Self-pay | Admitting: Oncology

## 2020-09-27 ENCOUNTER — Inpatient Hospital Stay: Payer: 59

## 2020-09-27 ENCOUNTER — Other Ambulatory Visit: Payer: Self-pay

## 2020-09-27 VITALS — BP 132/87 | HR 93 | Temp 97.2°F

## 2020-09-27 DIAGNOSIS — R112 Nausea with vomiting, unspecified: Secondary | ICD-10-CM

## 2020-09-27 DIAGNOSIS — Z5111 Encounter for antineoplastic chemotherapy: Secondary | ICD-10-CM | POA: Diagnosis not present

## 2020-09-27 DIAGNOSIS — Z170 Estrogen receptor positive status [ER+]: Secondary | ICD-10-CM

## 2020-09-27 DIAGNOSIS — C50211 Malignant neoplasm of upper-inner quadrant of right female breast: Secondary | ICD-10-CM

## 2020-09-27 MED ORDER — HEPARIN SOD (PORK) LOCK FLUSH 100 UNIT/ML IV SOLN
500.0000 [IU] | Freq: Once | INTRAVENOUS | Status: AC
  Administered 2020-09-27: 500 [IU] via INTRAVENOUS
  Filled 2020-09-27: qty 5

## 2020-09-27 MED ORDER — PEGFILGRASTIM-BMEZ 6 MG/0.6ML ~~LOC~~ SOSY
6.0000 mg | PREFILLED_SYRINGE | Freq: Once | SUBCUTANEOUS | Status: AC
  Administered 2020-09-27: 6 mg via SUBCUTANEOUS

## 2020-09-27 MED ORDER — SODIUM CHLORIDE 0.9 % IV SOLN
Freq: Once | INTRAVENOUS | Status: AC
  Filled 2020-09-27: qty 250

## 2020-09-27 MED ORDER — SODIUM CHLORIDE 0.9% FLUSH
10.0000 mL | Freq: Once | INTRAVENOUS | Status: AC
  Administered 2020-09-27: 10 mL via INTRAVENOUS
  Filled 2020-09-27: qty 10

## 2020-09-27 NOTE — Patient Instructions (Signed)

## 2020-09-28 LAB — CALCIUM, IONIZED: Calcium, Ionized, Serum: 4.7 mg/dL (ref 4.5–5.6)

## 2020-10-15 NOTE — Progress Notes (Signed)
[place]  Telephone:(336) [phone] [phone]     ID: Kayla Little DOB: [birth year]  MR#: [id number]  GOT#:[id number]  Patient Care Team: [name] [place] as PCP - General (Family Medicine) Magrinat, Virgie Dad, MD as Consulting Physician (Oncology) Kyung Rudd, MD as Consulting Physician (Radiation Oncology) Armandina Gemma, MD as Consulting Physician (General Surgery) Rockwell Germany, RN as Registered Nurse Mauro Kaufmann, RN as Registered Nurse Scot Dock, NP OTHER MD:  CHIEF COMPLAINT: Triple positive locally advanced breast cancer  CURRENT TREATMENT: Neoadjuvant chemotherapy   INTERVAL HISTORY: Kayla Little returns today for follow up and treatment of her locally advanced breast cancer. She was evaluated in the breast cancer clinic on 07/31/2020.  She is here today to receive cycle 5 day 1 of Docetaxel, carboplatin and Trastuzumab.  She was receiving Pertuzmab, however that was discontinued after cycle 1 due to diarrhea and dehydration.     Kayla Little continues to do well with treatment.  She notes an increase in sweating at night, some watery eyes, some mild facial swelling.  She is also requesting a refill of Ativan.  She is concerned the effect this treatment has on her thyroid level and would like this checked.      REVIEW OF SYSTEMS: Review of Systems  Constitutional:  Positive for fatigue. Negative for appetite change, chills, fever and unexpected weight change.  HENT:   Negative for hearing loss, lump/mass and trouble swallowing.   Eyes:  Negative for eye problems and icterus.  Respiratory:  Negative for chest tightness, cough and shortness of breath.   Cardiovascular:  Negative for chest pain, leg swelling and palpitations.  Gastrointestinal:  Negative for abdominal distention, abdominal pain, constipation, diarrhea, nausea and vomiting.  Endocrine: Negative for hot flashes.  Genitourinary:  Negative for difficulty urinating.    Musculoskeletal:  Negative for arthralgias.  Skin:  Negative for itching and rash.  Neurological:  Negative for dizziness, extremity weakness, headaches and numbness.  Hematological:  Negative for adenopathy. Does not bruise/bleed easily.  Psychiatric/Behavioral:  Negative for depression. The patient is not nervous/anxious.      COVID 19 VACCINATION STATUS: Status post Moderna x2 followed by [employer] booster   HISTORY OF CURRENT ILLNESS: From the original intake note:  Kayla Pro "[name]" (the daughter of my patient, [name], whom I saw earlier today). has a history of medullary thyroid cancer, status post total thyroidectomy on 04/12/2019 under Dr. Harlow Asa.   More recently she presented to her GYN with a right supraclavicular lump, prompting neck and chest CT on 06/24/2020. Neck CT showed: no recurrent mass in thyroid bed, palpable abnormality corresponds to a cluster of 2 lymph nodes in right supraclavicular region; prominent lym ph nodes in neck bilaterally. Chest CT showed: enhancing 1.4 cm lesion in medial right breast with associated mildly enlarged right axillary lymph nodes; 1.2 cm lesion in left hepatic lobe with central hyperattenuation; enlargement of right infrahilar and right hilar nodal tissue is borderline suspicious; sclerotic lesion with spiculated margins at T7 level favored to represent a bone island.  She underwent bilateral diagnostic mammography with tomography and bilateral breast ultrasonography at [facility] on 06/27/2020 showing: breast density category D; Right Breast 1. 2.6 cm irregular mass in inner breast, with associated microcalcifications spanning up to 5.1 cm, corresponding to chest CT finding. 2. Adjacent 0.9 cm mass in inner breast, 2 cm away from dominant mass. 3. Two indeterminate masses in outer breast. 4. At least 3 enlarged lymph nodes  in right axilla  Left Breast 1. Indeterminate 1.2 cm mass in lower-outer breast, possibly a fibroadenoma 2. No enlarged  lymphadenopathy in left axilla.  Accordingly on 06/30/2020 she proceeded to biopsy of the right breast area in question. The pathology from this procedure ([id number]) showed:  1. Dominant mass  - invasive ductal carcinoma, grade 1  - ductal carcinoma in situ  - Prognostic indicators significant for: estrogen receptor, 90% positive and progesterone receptor, 80% positive, both with moderate staining intensity.  HER2 equivocal by immunohistochemistry (2+), but positive by FISH, with a signals ratio of 2.2 and the number per cell greater than 5.. 2. Smaller mass  - invasive ductal carcinoma, grade 1  - ductal carcinoma in situ  - Prognostic indicators significant for: estrogen receptor, 100% positive with strong staining intensity and progesterone receptor, 50% positive with moderate staining intensity.  HER2 positive by immunohistochemistry (3+). 3. Right axillary lymph node  - invasive ductal carcinoma, grade 1 Prognostic indicators significant for: estrogen receptor, 100% positive and progesterone receptor, 90% positive, both with strong staining intensity. HER2 equivocal by immunohistochemistry (2+), {but (positive or negative) by fluorescent in situ hybridization   Biopsy of the left breast area was performed the same day. Pathology from the procedure ([id number]) was benign.  She underwent breast MRI on 07/06/2020 showing: breast composition C; enhancing mass with adjacent non-mass enhancement in lower-inner right breast, which together measure 5.4 cm, corresponding with two biopsy-proven areas of malignancy; no other suspicious enhancement identified in right breast; one 4 mm right internal mammary lymph node with unclear significance; numerous abnormal right axillary lymph nodes; no evidence of left breast malignancy.  Cancer Staging Malignant neoplasm of upper-inner quadrant of right breast in female, estrogen receptor positive ([place]) Staging form: Breast, AJCC 8th Edition - Clinical stage  from 07/08/2020: Stage IIIA (cT3, cN3, cM0, G1, ER+, PR+, HER2+) - Unsigned Stage prefix: Initial diagnosis Histologic grading system: 3 grade system  The patient's subsequent history is as detailed below.   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)    Hx of varicella    Kidney stone 2012   Medullary thyroid carcinoma ([place])    Monoallelic mutation of CHEK2 gene in female patient    Postpartum care following vaginal delivery (7/27) 10/02/2014, June 16 , 2020   Thyroid cancer [facility]) 2021    PAST SURGICAL HISTORY: Past Surgical History:  Procedure Laterality Date   IR IMAGING GUIDED PORT INSERTION  07/22/2020   NO PAST SURGERIES     THYROIDECTOMY N/A 04/12/2019   Procedure: TOTAL THYROIDECTOMY WITH LIMITED LYMPH NODE DISSECTION;  Surgeon: Armandina Gemma, MD;  Location: [facility];  Service: General;  Laterality: N/A;   UNILATERAL SALPINGECTOMY Right 07/09/2017   Procedure: RIGHT SALPINGECTOMY WITH REMOVAL OF ECTOPIC PREGNANCY;  Surgeon: Azucena Fallen, MD;  Location: [place] [facility];  Service: Gynecology;  Laterality: Right;    FAMILY HISTORY: Family History  Problem Relation Age of Onset   Breast cancer Mother 69       2 types of cancer one side mastectomy with chemo   Endometriosis Mother    Endometriosis  Maternal Grandmother    Breast cancer Maternal Grandmother        post menopausal   Diabetes Maternal Grandmother    Cancer Maternal Grandfather        prostate and lymphoma   Alzheimer's disease Paternal Grandmother    Cancer Father 27       prostate cancer   Aneurysm Paternal Grandfather        d. 25s   Please see genetic counseling note dated 11/23/2018 for  full family information.   GYNECOLOGIC HISTORY:  No LMP recorded. (Menstrual status: IUD). Menarche: 35 years old Age at first live birth: 35 years old [place] P 2 Patient has a Mirena IUD in place HRT n/a  Hysterectomy? no BSO?  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  Her husband Barnabas Lister works for [employer].  Their children are Dylan 5 and Emma 2.  Barnabas Lister has a 32 year old daughter, the patient's stepdaughter Hildred Alamin, currently attending [employer].  The patient is a [employer]    ADVANCED DIRECTIVES: In the absence of any documents to the contrary the patient's husband is her healthcare power of attorney   HEALTH MAINTENANCE: Social History   Tobacco Use   Smoking status: Some Days    Packs/day: 0.50    Years: 5.00    Pack years: 2.50    Types: Cigarettes   Smokeless tobacco: Never   Tobacco comments:    Actively trying to quit.  Vaping Use   Vaping Use: Never used  Substance Use Topics   Alcohol use: Yes    Alcohol/week: 7.0 - 14.0 standard drinks    Types: 7 - 14 Glasses of wine per week    Comment: glass of wine each night    Drug use: Not Currently    Types: Marijuana    Comment: not during pregnancy, last use was 03/27/2019     Colonoscopy: n/a (age)  PAP:   Bone density: n/a (age)   No Known Allergies  Current Outpatient Medications  Medication Sig Dispense Refill   acetaminophen (TYLENOL) 325 MG tablet Take 2 tablets (650 mg total) by mouth every 4 (four) hours as needed (for pain scale < 4). 100 tablet 0   calcitRIOL (ROCALTROL) 0.5 MCG capsule Take 1 capsule (0.5 mcg total) by mouth 2 (two) times a week. 30 capsule 1   dexamethasone (DECADRON) 4 MG tablet Take 2 tablets (8 mg total) by mouth 2 (two) times daily. Start the day before Taxotere. Then take daily x 3 days after chemotherapy. 30 tablet 1   levonorgestrel (MIRENA) 20 MCG/24HR IUD 1 each by  Intrauterine route once.     levothyroxine (SYNTHROID) 175 MCG tablet Take 175 mcg by mouth daily before breakfast.     lidocaine-prilocaine (EMLA) cream Apply to affected area once (Patient taking differently: Apply 1 application topically as needed (port access).) 30 g 3   liothyronine (CYTOMEL) 5 MCG tablet Take 5 mcg by mouth daily.     loperamide (IMODIUM) 2 MG capsule Take 2 mg by mouth as needed for diarrhea or loose stools.     loratadine (CLARITIN) 10 MG tablet Take 1 tablet (10 mg total) by mouth daily. 90 tablet 0   prochlorperazine (COMPAZINE) 10 MG tablet Take 1 tablet (10 mg total) by mouth every 6 (six) hours as needed (Nausea or vomiting). 30 tablet 1   sertraline (ZOLOFT) 50 MG tablet Take 50 mg by mouth  at bedtime.     valACYclovir (VALTREX) 500 MG tablet Take 1 tablet (500 mg total) by mouth daily. 90 tablet 0   VYVANSE 60 MG capsule Take 60 mg by mouth every morning.     LORazepam (ATIVAN) 0.5 MG tablet Take 1 tablet (0.5 mg total) by mouth every 12 (twelve) hours as needed for anxiety. 30 tablet 0   ondansetron (ZOFRAN) 8 MG tablet Take 1 tablet (8 mg total) by mouth 2 (two) times daily as needed (Nausea or vomiting). Start on the third day after chemotherapy. (Patient not taking: No sig reported) 30 tablet 1   No current facility-administered medications for this visit.    OBJECTIVE:  Vitals:   10/16/20 0902  BP: 132/73  Pulse: (!) 103  Resp: 18  Temp: 97.6 F (36.4 C)  SpO2: 100%      Body mass index is 26.41 kg/m.   Wt Readings from Last 3 Encounters:  10/16/20 163 lb 9.6 oz (74.2 kg)  09/25/20 160 lb 3.2 oz (72.7 kg)  09/04/20 161 lb (73 kg)  ECOG FS:1 - Symptomatic but completely ambulatory GENERAL: Patient is a well appearing female in no acute distress HEENT:  Sclerae anicteric.  Oropharynx clear and moist. No ulcerations or evidence of oropharyngeal candidiasis. Neck is supple.  NODES:  No cervical, supraclavicular, or axillary lymphadenopathy palpated.   BREAST EXAM: no sign of progression LUNGS:  Clear to auscultation bilaterally.  No wheezes or rhonchi. HEART:  Regular rate and rhythm. No murmur appreciated. ABDOMEN:  Soft, nontender.  Positive, normoactive bowel sounds. No organomegaly palpated. MSK:  No focal spinal tenderness to palpation. Full range of motion bilaterally in the upper extremities. EXTREMITIES:  No peripheral edema.   SKIN:  Clear with no obvious rashes or skin changes. No nail dyscrasia. NEURO:  Nonfocal. Well oriented.  Appropriate affect.    LAB RESULTS:  CMP     Component Value Date/Time   NA 141 10/16/2020 0842   NA 142 02/16/2017 1332   K 4.0 10/16/2020 0842   CL 108 10/16/2020 0842   CO2 22 10/16/2020 0842   GLUCOSE 160 (H) 10/16/2020 0842   BUN 11 10/16/2020 0842   BUN 9 02/16/2017 1332   CREATININE 0.73 10/16/2020 0842   CREATININE 0.55 09/25/2020 1039   CREATININE 0.82 12/04/2012 1732   CALCIUM 8.7 (L) 10/16/2020 0842   PROT 7.1 10/16/2020 0842   PROT 6.5 02/16/2017 1332   ALBUMIN 4.2 10/16/2020 0842   ALBUMIN 4.3 02/16/2017 1332   AST 15 10/16/2020 0842   AST 20 09/25/2020 1039   ALT 22 10/16/2020 0842   ALT 27 09/25/2020 1039   ALKPHOS 71 10/16/2020 0842   BILITOT 0.4 10/16/2020 0842   BILITOT 0.7 09/25/2020 1039   GFRNONAA >60 10/16/2020 0842   GFRNONAA >60 09/25/2020 1039   GFRAA >60 04/15/2019 2145    No results found for: TOTALPROTELP, ALBUMINELP, A1GS, A2GS, BETS, BETA2SER, GAMS, MSPIKE, SPEI  Lab Results  Component Value Date   WBC 9.7 10/16/2020   NEUTROABS 8.0 (H) 10/16/2020   HGB 10.1 (L) 10/16/2020   HCT 29.8 (L) 10/16/2020   MCV 103.8 (H) 10/16/2020   PLT 292 10/16/2020    No results found for: LABCA2  No components found for: OFBPZW258  No results for input(s): INR in the last 168 hours.  No results found for: LABCA2  No results found for: NID782  No results found for: UMP536  No results found for: RWE315  Lab Results  Component Value  Date   CA2729  15.9 07/30/2020    No components found for: HGQUANT  Lab Results  Component Value Date   CEA1 5.17 (H) 07/30/2020   /  CEA ([facility])  Date Value Ref Range Status  07/30/2020 5.17 (H) 0.00 - 5.00 ng/mL Final    Comment:    (NOTE) This test was performed using [employer]'s Chemiluminescent Microparticle Immunoassay. Values obtained from different assay methods cannot be used interchangeably. Please note that 5-10% of patients who smoke may see CEA levels up to 6.9 ng/mL. Performed at [facility], [place] [address] [place], [place] [address]      No results found for: AFPTUMOR  No results found for: CHROMOGRNA  No results found for: KPAFRELGTCHN, LAMBDASER, KAPLAMBRATIO (kappa/lambda light chains)  No results found for: HGBA, HGBA2QUANT, HGBFQUANT, HGBSQUAN (Hemoglobinopathy evaluation)   No results found for: LDH  No results found for: IRON, TIBC, IRONPCTSAT (Iron and TIBC)  No results found for: FERRITIN  Urinalysis    Component Value Date/Time   COLORURINE YELLOW 07/31/2020 [place] 07/31/2020 0558   LABSPEC 1.005 07/31/2020 0558   PHURINE 6.0 07/31/2020 [place] 07/31/2020 0558   HGBUR NEGATIVE 07/31/2020 0558   BILIRUBINUR NEGATIVE 07/31/2020 0558   BILIRUBINUR small (A) 02/16/2017 1216   BILIRUBINUR small 12/04/2012 [place] 07/31/2020 0558   PROTEINUR NEGATIVE 07/31/2020 0558   UROBILINOGEN 0.2 02/16/2017 1216   UROBILINOGEN 0.2 09/18/2013 1555   NITRITE NEGATIVE 07/31/2020 0558   LEUKOCYTESUR NEGATIVE 07/31/2020 0558    STUDIES: No results found.    ELIGIBLE FOR AVAILABLE RESEARCH PROTOCOL:   ASSESSMENT: 35 y.o. [place] woman with a clinical mT2 N3, stage IIIA invasive ductal carcinoma, grade 1, triple positive  (a) breast MRI 07/06/2020 finds a 1.6 cm lower inner quadrant mass associated with 5.4 cm of non-mass-like enhancement, at least 8 enlarged right axillary  lymph node and 1 right internal mammary lymph node  (b) CT scans of the neck and chest 06/24/2020 shows subcentimeter right cervical adenopathy, 2 right supraclavicular lymph nodes the larger 0.65 cm, also left cervical adenopathy largest measuring 1.02 cm; there is also a 1.2 cm lesion in the left hepatic lobe, and a sclerotic lesion in the mid thoracic spine at T7  (c) PET scan 07/21/2020 shows regional adenopathy (including 2 right subcentimeter supraclavicular lymph nodes and subcentimeter lymph nodes in the right hilar, subcarinal and lateral aortic region), no uptake in liver, and an indeterminate T12 lesion  (d) thoracic spine MRI 07/27/2020 shows the T12 lesion to be likely benign, there is also degenerative disc disease  (1) status post total thyroidectomy and central compartment lymph node biopsy 04/12/2019 for a pT1a pN0 medullary thyroid carcinoma, with negative margins, no angioinvasion, and no extrathyroidal extension  (a) CEA and calcitonin   (2) genetics testing 10/27/2018 showed   (a) 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 21 days x 6, starting 07/23/2020  (4) anti-HER2 immunotherapy to continue to total 1 year  (a) echo 07/22/2020 shows an ejection fraction in the 60-65% range.  (5) definitive surgery to follow  (6) adjuvant radiation  (7) antiestrogens   PLAN:  Kayla Little is doing well today.  She will  proceed with cycle 5 of Docetaxel, Carboplatin, and Trastuzumab today.  She continues to tolerate this moderately well.  She has no peripheral neuropathy and we are monitoring this closely.  Her CBC is stable and I reviewed this  with her in detail.  Her CMET is pending.  The sweating is likely multifactorial related to the dexamethasone she is taking and the hormone changes from chemo.  I added on a TSH to be drawn in the chemo room.  I recommended that she use systane eye gtts for her eyes.  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  She will let us know if it worsens, as she may need steroid eye gtt in the future.    Kayla Little will return on Saturday for IV fluids and in 3 weeks for labs, f/u and her final chemotherapy.  I placed orders for her to have her MRI completed at this time, and also for her to undergo her echocardiogram.  She understands this.    We will see her back in three weeks.  She knows to call for any questions that may arise between now and her next appointment.  We are happy to see her sooner if needed.  Total encounter time: 30 minutes* in face to face visit time, chart review, lab review, coordination of care, and documentation of the encounter.   Wilber Bihari, NP 10/16/20 9:54 AM Medical Oncology and Hematology [facility] [place], [place] [address] Tel. [phone]    Fax. [phone]    *Total Encounter Time as defined by the Centers for Medicare and Medicaid Services includes, in addition to the face-to-face time of a patient visit (documented in the note above) non-face-to-face time: obtaining and reviewing outside history, ordering and reviewing medications, tests or procedures, care coordination (communications with other health care professionals or caregivers) and documentation in the medical record.

## 2020-10-16 ENCOUNTER — Encounter: Payer: Self-pay | Admitting: Adult Health

## 2020-10-16 ENCOUNTER — Inpatient Hospital Stay (HOSPITAL_BASED_OUTPATIENT_CLINIC_OR_DEPARTMENT_OTHER): Payer: 59

## 2020-10-16 ENCOUNTER — Inpatient Hospital Stay (HOSPITAL_BASED_OUTPATIENT_CLINIC_OR_DEPARTMENT_OTHER): Payer: 59 | Admitting: Adult Health

## 2020-10-16 ENCOUNTER — Inpatient Hospital Stay: Payer: 59 | Attending: Oncology

## 2020-10-16 ENCOUNTER — Other Ambulatory Visit: Payer: Self-pay

## 2020-10-16 ENCOUNTER — Encounter: Payer: Self-pay | Admitting: Oncology

## 2020-10-16 ENCOUNTER — Encounter: Payer: Self-pay | Admitting: *Deleted

## 2020-10-16 VITALS — BP 132/73 | HR 103 | Temp 97.6°F | Resp 18 | Ht 66.0 in | Wt 163.6 lb

## 2020-10-16 VITALS — HR 98

## 2020-10-16 DIAGNOSIS — Z148 Genetic carrier of other disease: Secondary | ICD-10-CM | POA: Insufficient documentation

## 2020-10-16 DIAGNOSIS — Z95828 Presence of other vascular implants and grafts: Secondary | ICD-10-CM

## 2020-10-16 DIAGNOSIS — Z170 Estrogen receptor positive status [ER+]: Secondary | ICD-10-CM

## 2020-10-16 DIAGNOSIS — C50211 Malignant neoplasm of upper-inner quadrant of right female breast: Secondary | ICD-10-CM

## 2020-10-16 DIAGNOSIS — Z85850 Personal history of malignant neoplasm of thyroid: Secondary | ICD-10-CM | POA: Insufficient documentation

## 2020-10-16 DIAGNOSIS — Z5111 Encounter for antineoplastic chemotherapy: Secondary | ICD-10-CM | POA: Diagnosis present

## 2020-10-16 DIAGNOSIS — E3122 Multiple endocrine neoplasia [MEN] type IIA: Secondary | ICD-10-CM

## 2020-10-16 LAB — COMPREHENSIVE METABOLIC PANEL
ALT: 22 U/L (ref 0–44)
AST: 15 U/L (ref 15–41)
Albumin: 4.2 g/dL (ref 3.5–5.0)
Alkaline Phosphatase: 71 U/L (ref 38–126)
Anion gap: 11 (ref 5–15)
BUN: 11 mg/dL (ref 6–20)
CO2: 22 mmol/L (ref 22–32)
Calcium: 8.7 mg/dL — ABNORMAL LOW (ref 8.9–10.3)
Chloride: 108 mmol/L (ref 98–111)
Creatinine, Ser: 0.73 mg/dL (ref 0.44–1.00)
GFR, Estimated: >60 mL/min (ref >60)
Glucose, Bld: 160 mg/dL — ABNORMAL HIGH (ref 70–99)
Potassium: 4 mmol/L (ref 3.5–5.1)
Sodium: 141 mmol/L (ref 135–145)
Total Bilirubin: 0.4 mg/dL (ref 0.3–1.2)
Total Protein: 7.1 g/dL (ref 6.5–8.1)

## 2020-10-16 LAB — CBC WITH DIFFERENTIAL/PLATELET
Abs Immature Granulocytes: 0.17 10*3/uL — ABNORMAL HIGH (ref 0.00–0.07)
Basophils Absolute: 0 10*3/uL (ref 0.0–0.1)
Basophils Relative: 0 %
Eosinophils Absolute: 0 10*3/uL (ref 0.0–0.5)
Eosinophils Relative: 0 %
HCT: 29.8 % — ABNORMAL LOW (ref 36.0–46.0)
Hemoglobin: 10.1 g/dL — ABNORMAL LOW (ref 12.0–15.0)
Immature Granulocytes: 2 %
Lymphocytes Relative: 7 %
Lymphs Abs: 0.7 10*3/uL (ref 0.7–4.0)
MCH: 35.2 pg — ABNORMAL HIGH (ref 26.0–34.0)
MCHC: 33.9 g/dL (ref 30.0–36.0)
MCV: 103.8 fL — ABNORMAL HIGH (ref 80.0–100.0)
Monocytes Absolute: 0.8 10*3/uL (ref 0.1–1.0)
Monocytes Relative: 9 %
Neutro Abs: 8 10*3/uL — ABNORMAL HIGH (ref 1.7–7.7)
Neutrophils Relative %: 82 %
Platelets: 292 10*3/uL (ref 150–400)
RBC: 2.87 MIL/uL — ABNORMAL LOW (ref 3.87–5.11)
RDW: 16.7 % — ABNORMAL HIGH (ref 11.5–15.5)
WBC: 9.7 10*3/uL (ref 4.0–10.5)
nRBC: 0 % (ref 0.0–0.2)

## 2020-10-16 LAB — PREGNANCY, URINE: Preg Test, Ur: NEGATIVE

## 2020-10-16 LAB — TSH: TSH: 0.234 u[IU]/mL — ABNORMAL LOW (ref 0.308–3.960)

## 2020-10-16 MED ORDER — LORAZEPAM 0.5 MG PO TABS: 0.5000 mg | ORAL_TABLET | Freq: Two times a day (BID) | ORAL | 0 refills | Status: DC | PRN

## 2020-10-16 MED ORDER — PALONOSETRON HCL INJECTION 0.25 MG/5ML
INTRAVENOUS | Status: AC
  Filled 2020-10-16: qty 5

## 2020-10-16 MED ORDER — DIPHENHYDRAMINE HCL 25 MG PO CAPS
ORAL_CAPSULE | ORAL | Status: AC
  Filled 2020-10-16: qty 1

## 2020-10-16 MED ORDER — SODIUM CHLORIDE 0.9 % IV SOLN
10.0000 mg | Freq: Once | INTRAVENOUS | Status: AC
  Administered 2020-10-16: 10 mg via INTRAVENOUS
  Filled 2020-10-16: qty 10

## 2020-10-16 MED ORDER — DIPHENHYDRAMINE HCL 25 MG PO CAPS
25.0000 mg | ORAL_CAPSULE | Freq: Once | ORAL | Status: AC
  Administered 2020-10-16: 25 mg via ORAL

## 2020-10-16 MED ORDER — ACETAMINOPHEN 325 MG PO TABS
ORAL_TABLET | ORAL | Status: AC
  Filled 2020-10-16: qty 2

## 2020-10-16 MED ORDER — HEPARIN SOD (PORK) LOCK FLUSH 100 UNIT/ML IV SOLN
500.0000 [IU] | Freq: Once | INTRAVENOUS | Status: AC | PRN
  Administered 2020-10-16: 500 [IU]
  Filled 2020-10-16: qty 5

## 2020-10-16 MED ORDER — SODIUM CHLORIDE 0.9 % IV SOLN
Freq: Once | INTRAVENOUS | Status: AC
  Filled 2020-10-16: qty 250

## 2020-10-16 MED ORDER — SODIUM CHLORIDE 0.9 % IV SOLN
150.0000 mg | Freq: Once | INTRAVENOUS | Status: AC
  Administered 2020-10-16: 150 mg via INTRAVENOUS
  Filled 2020-10-16: qty 150

## 2020-10-16 MED ORDER — SODIUM CHLORIDE 0.9% FLUSH
10.0000 mL | INTRAVENOUS | Status: DC | PRN
  Administered 2020-10-16: 10 mL
  Filled 2020-10-16: qty 10

## 2020-10-16 MED ORDER — SODIUM CHLORIDE 0.9% FLUSH
10.0000 mL | INTRAVENOUS | Status: AC | PRN
  Administered 2020-10-16: 10 mL
  Filled 2020-10-16: qty 10

## 2020-10-16 MED ORDER — TRASTUZUMAB-ANNS CHEMO 150 MG IV SOLR
450.0000 mg | Freq: Once | INTRAVENOUS | Status: AC
  Administered 2020-10-16: 450 mg via INTRAVENOUS
  Filled 2020-10-16: qty 21.43

## 2020-10-16 MED ORDER — ACETAMINOPHEN 325 MG PO TABS
650.0000 mg | ORAL_TABLET | Freq: Once | ORAL | Status: AC
  Administered 2020-10-16: 650 mg via ORAL

## 2020-10-16 MED ORDER — SODIUM CHLORIDE 0.9 % IV SOLN
690.0000 mg | Freq: Once | INTRAVENOUS | Status: AC
  Administered 2020-10-16: 690 mg via INTRAVENOUS
  Filled 2020-10-16: qty 69

## 2020-10-16 MED ORDER — SODIUM CHLORIDE 0.9 % IV SOLN
75.0000 mg/m2 | Freq: Once | INTRAVENOUS | Status: AC
  Administered 2020-10-16: 140 mg via INTRAVENOUS
  Filled 2020-10-16: qty 14

## 2020-10-16 MED ORDER — PALONOSETRON HCL INJECTION 0.25 MG/5ML
0.2500 mg | Freq: Once | INTRAVENOUS | Status: AC
  Administered 2020-10-16: 0.25 mg via INTRAVENOUS

## 2020-10-17 LAB — CALCIUM, IONIZED: Calcium, Ionized, Serum: 4.7 mg/dL (ref 4.5–5.6)

## 2020-10-18 ENCOUNTER — Inpatient Hospital Stay: Payer: 59

## 2020-10-18 ENCOUNTER — Other Ambulatory Visit: Payer: Self-pay

## 2020-10-18 VITALS — BP 128/81 | HR 85 | Temp 97.7°F | Resp 16

## 2020-10-18 DIAGNOSIS — C50211 Malignant neoplasm of upper-inner quadrant of right female breast: Secondary | ICD-10-CM

## 2020-10-18 DIAGNOSIS — Z5111 Encounter for antineoplastic chemotherapy: Secondary | ICD-10-CM | POA: Diagnosis not present

## 2020-10-18 DIAGNOSIS — R112 Nausea with vomiting, unspecified: Secondary | ICD-10-CM

## 2020-10-18 LAB — URINALYSIS, COMPLETE (UACMP) WITH MICROSCOPIC
Bacteria, UA: NONE SEEN
Bilirubin Urine: NEGATIVE
Glucose, UA: NEGATIVE mg/dL
Ketones, ur: NEGATIVE mg/dL
Leukocytes,Ua: NEGATIVE
Nitrite: NEGATIVE
Protein, ur: 30 mg/dL — AB
RBC / HPF: >50 RBC/hpf — ABNORMAL HIGH (ref 0–5)
Specific Gravity, Urine: 1.009 (ref 1.005–1.030)
WBC, UA: >50 WBC/hpf — ABNORMAL HIGH (ref 0–5)
pH: 8 (ref 5.0–8.0)

## 2020-10-18 MED ORDER — PEGFILGRASTIM-BMEZ 6 MG/0.6ML ~~LOC~~ SOSY
6.0000 mg | PREFILLED_SYRINGE | Freq: Once | SUBCUTANEOUS | Status: AC
  Administered 2020-10-18: 6 mg via SUBCUTANEOUS

## 2020-10-18 MED ORDER — SODIUM CHLORIDE 0.9 % IV SOLN
Freq: Once | INTRAVENOUS | Status: AC
  Filled 2020-10-18: qty 250

## 2020-10-18 MED ORDER — HEPARIN SOD (PORK) LOCK FLUSH 100 UNIT/ML IV SOLN
500.0000 [IU] | Freq: Once | INTRAVENOUS | Status: AC
  Administered 2020-10-18: 500 [IU] via INTRAVENOUS
  Filled 2020-10-18: qty 5

## 2020-10-18 MED ORDER — SODIUM CHLORIDE 0.9 % IV SOLN
Freq: Once | INTRAVENOUS | Status: DC
  Filled 2020-10-18: qty 250

## 2020-10-18 NOTE — Progress Notes (Signed)
During infusion appointment, patient reported L sided flank pain and blood in her urine. Urine sample was provided verbal order from Dr. Alen Blew was obtained for urinalysis and urine culture.

## 2020-10-18 NOTE — Progress Notes (Signed)
Patient tolerated IVF NS infusion well today, no concerns voiced. ZIEXTENZO) injection given. Informed to continue to monitor urinary symptoms and to await for call of treatment options once urine culture is reviewed. No questions at this time. Patient discharged. Stable.

## 2020-10-20 ENCOUNTER — Encounter: Payer: Self-pay | Admitting: *Deleted

## 2020-10-20 ENCOUNTER — Telehealth: Payer: Self-pay | Admitting: Adult Health

## 2020-10-20 LAB — URINE CULTURE: Culture: <10000 — AB

## 2020-10-20 NOTE — Telephone Encounter (Signed)
Scheduled appointment per 08/12 los. Patient is aware.

## 2020-10-28 ENCOUNTER — Inpatient Hospital Stay (HOSPITAL_COMMUNITY): Admission: RE | Admit: 2020-10-28 | Payer: 59 | Source: Ambulatory Visit

## 2020-10-29 ENCOUNTER — Telehealth: Payer: Self-pay | Admitting: *Deleted

## 2020-10-29 MED ORDER — AMOXICILLIN-POT CLAVULANATE 875-125 MG PO TABS: 1.0000 | ORAL_TABLET | Freq: Two times a day (BID) | ORAL | 0 refills | Status: DC

## 2020-10-29 NOTE — Telephone Encounter (Signed)
Pt called to state the dentist office cannot see her until this Friday. She is concerned due to the swelling and possible infection. She is on the cancellation list for visit.  Per MD review- prescription for antibiotic sent to pharmacy.  Discussed with pt as well as scheduled her lab prior to the dental visit for possible need per dental care.

## 2020-10-29 NOTE — Telephone Encounter (Signed)
Pt called to this RN to state she has a known tooth that needed a root canal prior to starting chemo- presently the area of the tooth in the jaw is swollen " almost like a sinus infection ". She denies pain in area - nor fever.  She is inquiring about how to proceed " because I can't go to the dentist while doing chemo ".  This RN informed pt she may proceed to dental exam- if she needs invasive treatment she would need lab and likely antibiotic coverage.  She should inform the dentist of current treatment for best outcome.  If lab needed she can call but also if unable to reach nurse and lab needed urgently to come in and ask for this RN for labs to be obtained.  Pt verbalized understanding.

## 2020-10-30 ENCOUNTER — Encounter: Payer: Self-pay | Admitting: Oncology

## 2020-10-31 ENCOUNTER — Other Ambulatory Visit: Payer: Self-pay

## 2020-10-31 ENCOUNTER — Inpatient Hospital Stay: Payer: 59

## 2020-10-31 DIAGNOSIS — C50211 Malignant neoplasm of upper-inner quadrant of right female breast: Secondary | ICD-10-CM

## 2020-10-31 DIAGNOSIS — Z170 Estrogen receptor positive status [ER+]: Secondary | ICD-10-CM

## 2020-10-31 DIAGNOSIS — Z5111 Encounter for antineoplastic chemotherapy: Secondary | ICD-10-CM | POA: Diagnosis not present

## 2020-10-31 DIAGNOSIS — E3122 Multiple endocrine neoplasia [MEN] type IIA: Secondary | ICD-10-CM

## 2020-10-31 LAB — CBC WITH DIFFERENTIAL/PLATELET
Abs Immature Granulocytes: 0.21 10*3/uL — ABNORMAL HIGH (ref 0.00–0.07)
Basophils Absolute: 0.1 10*3/uL (ref 0.0–0.1)
Basophils Relative: 1 %
Eosinophils Absolute: 0 10*3/uL (ref 0.0–0.5)
Eosinophils Relative: 0 %
HCT: 31.8 % — ABNORMAL LOW (ref 36.0–46.0)
Hemoglobin: 10.8 g/dL — ABNORMAL LOW (ref 12.0–15.0)
Immature Granulocytes: 2 %
Lymphocytes Relative: 20 %
Lymphs Abs: 2.6 10*3/uL (ref 0.7–4.0)
MCH: 36.1 pg — ABNORMAL HIGH (ref 26.0–34.0)
MCHC: 34 g/dL (ref 30.0–36.0)
MCV: 106.4 fL — ABNORMAL HIGH (ref 80.0–100.0)
Monocytes Absolute: 0.6 10*3/uL (ref 0.1–1.0)
Monocytes Relative: 5 %
Neutro Abs: 9.6 10*3/uL — ABNORMAL HIGH (ref 1.7–7.7)
Neutrophils Relative %: 72 %
Platelets: 230 10*3/uL (ref 150–400)
RBC: 2.99 MIL/uL — ABNORMAL LOW (ref 3.87–5.11)
RDW: 16.5 % — ABNORMAL HIGH (ref 11.5–15.5)
WBC: 13 10*3/uL — ABNORMAL HIGH (ref 4.0–10.5)
nRBC: 0 % (ref 0.0–0.2)

## 2020-10-31 LAB — COMPREHENSIVE METABOLIC PANEL
ALT: 32 U/L (ref 0–44)
AST: 22 U/L (ref 15–41)
Albumin: 4.2 g/dL (ref 3.5–5.0)
Alkaline Phosphatase: 84 U/L (ref 38–126)
Anion gap: 9 (ref 5–15)
BUN: 11 mg/dL (ref 6–20)
CO2: 24 mmol/L (ref 22–32)
Calcium: 8 mg/dL — ABNORMAL LOW (ref 8.9–10.3)
Chloride: 107 mmol/L (ref 98–111)
Creatinine, Ser: 0.79 mg/dL (ref 0.44–1.00)
GFR, Estimated: >60 mL/min (ref >60)
Glucose, Bld: 90 mg/dL (ref 70–99)
Potassium: 3.9 mmol/L (ref 3.5–5.1)
Sodium: 140 mmol/L (ref 135–145)
Total Bilirubin: 0.5 mg/dL (ref 0.3–1.2)
Total Protein: 7 g/dL (ref 6.5–8.1)

## 2020-11-03 LAB — CALCIUM, IONIZED: Calcium, Ionized, Serum: 4.2 mg/dL — ABNORMAL LOW (ref 4.5–5.6)

## 2020-11-04 ENCOUNTER — Other Ambulatory Visit: Payer: Self-pay

## 2020-11-04 ENCOUNTER — Ambulatory Visit (HOSPITAL_COMMUNITY)
Admission: RE | Admit: 2020-11-04 | Discharge: 2020-11-04 | Disposition: A | Discharge status: Home / Self Care | Payer: 59 | Source: Ambulatory Visit | Attending: Adult Health | Admitting: Adult Health

## 2020-11-04 DIAGNOSIS — C50211 Malignant neoplasm of upper-inner quadrant of right female breast: Secondary | ICD-10-CM | POA: Diagnosis not present

## 2020-11-04 DIAGNOSIS — Z01818 Encounter for other preprocedural examination: Secondary | ICD-10-CM | POA: Diagnosis present

## 2020-11-04 DIAGNOSIS — Z170 Estrogen receptor positive status [ER+]: Secondary | ICD-10-CM

## 2020-11-04 DIAGNOSIS — Z0189 Encounter for other specified special examinations: Secondary | ICD-10-CM

## 2020-11-04 LAB — ECHOCARDIOGRAM COMPLETE
Area-P 1/2: 5.75 cm2
S' Lateral: 3.1 cm

## 2020-11-04 NOTE — Progress Notes (Signed)
  Echocardiogram 2D Echocardiogram has been performed.  Fidel Levy 11/04/2020, 10:05 AM

## 2020-11-06 ENCOUNTER — Other Ambulatory Visit: Payer: Self-pay

## 2020-11-06 ENCOUNTER — Inpatient Hospital Stay (HOSPITAL_BASED_OUTPATIENT_CLINIC_OR_DEPARTMENT_OTHER): Payer: 59 | Admitting: Adult Health

## 2020-11-06 ENCOUNTER — Inpatient Hospital Stay: Payer: 59

## 2020-11-06 ENCOUNTER — Inpatient Hospital Stay: Payer: 59 | Attending: Oncology

## 2020-11-06 ENCOUNTER — Encounter: Payer: Self-pay | Admitting: *Deleted

## 2020-11-06 ENCOUNTER — Encounter: Payer: Self-pay | Admitting: Oncology

## 2020-11-06 VITALS — HR 96

## 2020-11-06 VITALS — BP 144/82 | HR 114 | Temp 97.5°F | Resp 18 | Ht 66.0 in | Wt 165.7 lb

## 2020-11-06 DIAGNOSIS — F32A Depression, unspecified: Secondary | ICD-10-CM | POA: Diagnosis not present

## 2020-11-06 DIAGNOSIS — Z170 Estrogen receptor positive status [ER+]: Secondary | ICD-10-CM | POA: Diagnosis not present

## 2020-11-06 DIAGNOSIS — F17210 Nicotine dependence, cigarettes, uncomplicated: Secondary | ICD-10-CM | POA: Insufficient documentation

## 2020-11-06 DIAGNOSIS — C50211 Malignant neoplasm of upper-inner quadrant of right female breast: Secondary | ICD-10-CM

## 2020-11-06 DIAGNOSIS — M791 Myalgia, unspecified site: Secondary | ICD-10-CM | POA: Diagnosis not present

## 2020-11-06 DIAGNOSIS — Z5111 Encounter for antineoplastic chemotherapy: Secondary | ICD-10-CM | POA: Insufficient documentation

## 2020-11-06 DIAGNOSIS — Z85850 Personal history of malignant neoplasm of thyroid: Secondary | ICD-10-CM | POA: Insufficient documentation

## 2020-11-06 DIAGNOSIS — Z79899 Other long term (current) drug therapy: Secondary | ICD-10-CM | POA: Diagnosis not present

## 2020-11-06 DIAGNOSIS — R5383 Other fatigue: Secondary | ICD-10-CM | POA: Insufficient documentation

## 2020-11-06 DIAGNOSIS — Z148 Genetic carrier of other disease: Secondary | ICD-10-CM | POA: Insufficient documentation

## 2020-11-06 DIAGNOSIS — Z95828 Presence of other vascular implants and grafts: Secondary | ICD-10-CM

## 2020-11-06 LAB — CBC WITH DIFFERENTIAL/PLATELET
Abs Immature Granulocytes: 0.06 10*3/uL (ref 0.00–0.07)
Basophils Absolute: 0 10*3/uL (ref 0.0–0.1)
Basophils Relative: 0 %
Eosinophils Absolute: 0 10*3/uL (ref 0.0–0.5)
Eosinophils Relative: 0 %
HCT: 31.6 % — ABNORMAL LOW (ref 36.0–46.0)
Hemoglobin: 10.7 g/dL — ABNORMAL LOW (ref 12.0–15.0)
Immature Granulocytes: 1 %
Lymphocytes Relative: 13 %
Lymphs Abs: 1.4 10*3/uL (ref 0.7–4.0)
MCH: 36.1 pg — ABNORMAL HIGH (ref 26.0–34.0)
MCHC: 33.9 g/dL (ref 30.0–36.0)
MCV: 106.8 fL — ABNORMAL HIGH (ref 80.0–100.0)
Monocytes Absolute: 1.5 10*3/uL — ABNORMAL HIGH (ref 0.1–1.0)
Monocytes Relative: 14 %
Neutro Abs: 7.7 10*3/uL (ref 1.7–7.7)
Neutrophils Relative %: 72 %
Platelets: 349 10*3/uL (ref 150–400)
RBC: 2.96 MIL/uL — ABNORMAL LOW (ref 3.87–5.11)
RDW: 16 % — ABNORMAL HIGH (ref 11.5–15.5)
WBC: 10.7 10*3/uL — ABNORMAL HIGH (ref 4.0–10.5)
nRBC: 0 % (ref 0.0–0.2)

## 2020-11-06 LAB — COMPREHENSIVE METABOLIC PANEL
ALT: 20 U/L (ref 0–44)
AST: 20 U/L (ref 15–41)
Albumin: 4.7 g/dL (ref 3.5–5.0)
Alkaline Phosphatase: 53 U/L (ref 38–126)
Anion gap: 15 (ref 5–15)
BUN: 10 mg/dL (ref 6–20)
CO2: 20 mmol/L — ABNORMAL LOW (ref 22–32)
Calcium: 9.5 mg/dL (ref 8.9–10.3)
Chloride: 104 mmol/L (ref 98–111)
Creatinine, Ser: 0.76 mg/dL (ref 0.44–1.00)
GFR, Estimated: >60 mL/min (ref >60)
Glucose, Bld: 130 mg/dL — ABNORMAL HIGH (ref 70–99)
Potassium: 4.1 mmol/L (ref 3.5–5.1)
Sodium: 139 mmol/L (ref 135–145)
Total Bilirubin: 0.4 mg/dL (ref 0.3–1.2)
Total Protein: 7.6 g/dL (ref 6.5–8.1)

## 2020-11-06 LAB — TSH: TSH: 0.564 u[IU]/mL (ref 0.308–3.960)

## 2020-11-06 LAB — T4, FREE: Free T4: 1.3 ng/dL — ABNORMAL HIGH (ref 0.61–1.12)

## 2020-11-06 LAB — PREGNANCY, URINE: Preg Test, Ur: NEGATIVE

## 2020-11-06 LAB — MAGNESIUM: Magnesium: 2 mg/dL (ref 1.7–2.4)

## 2020-11-06 MED ORDER — DIPHENHYDRAMINE HCL 25 MG PO CAPS
25.0000 mg | ORAL_CAPSULE | Freq: Once | ORAL | Status: AC
  Administered 2020-11-06: 25 mg via ORAL
  Filled 2020-11-06: qty 1

## 2020-11-06 MED ORDER — SODIUM CHLORIDE 0.9 % IV SOLN
10.0000 mg | Freq: Once | INTRAVENOUS | Status: AC
  Administered 2020-11-06: 10 mg via INTRAVENOUS
  Filled 2020-11-06: qty 10

## 2020-11-06 MED ORDER — TRASTUZUMAB-ANNS CHEMO 150 MG IV SOLR
6.0000 mg/kg | Freq: Once | INTRAVENOUS | Status: AC
  Administered 2020-11-06: 441 mg via INTRAVENOUS
  Filled 2020-11-06: qty 21

## 2020-11-06 MED ORDER — SODIUM CHLORIDE 0.9% FLUSH
10.0000 mL | INTRAVENOUS | Status: DC | PRN
  Administered 2020-11-06: 10 mL

## 2020-11-06 MED ORDER — SODIUM CHLORIDE 0.9 % IV SOLN
75.0000 mg/m2 | Freq: Once | INTRAVENOUS | Status: AC
  Administered 2020-11-06: 140 mg via INTRAVENOUS
  Filled 2020-11-06: qty 14

## 2020-11-06 MED ORDER — SODIUM CHLORIDE 0.9 % IV SOLN: Freq: Once | INTRAVENOUS | Status: AC

## 2020-11-06 MED ORDER — ACETAMINOPHEN 325 MG PO TABS
650.0000 mg | ORAL_TABLET | Freq: Once | ORAL | Status: AC
  Administered 2020-11-06: 650 mg via ORAL
  Filled 2020-11-06: qty 2

## 2020-11-06 MED ORDER — SODIUM CHLORIDE 0.9 % IV SOLN
690.0000 mg | Freq: Once | INTRAVENOUS | Status: AC
  Administered 2020-11-06: 690 mg via INTRAVENOUS
  Filled 2020-11-06: qty 69

## 2020-11-06 MED ORDER — PALONOSETRON HCL INJECTION 0.25 MG/5ML
0.2500 mg | Freq: Once | INTRAVENOUS | Status: AC
  Administered 2020-11-06: 0.25 mg via INTRAVENOUS
  Filled 2020-11-06: qty 5

## 2020-11-06 MED ORDER — HEPARIN SOD (PORK) LOCK FLUSH 100 UNIT/ML IV SOLN
500.0000 [IU] | Freq: Once | INTRAVENOUS | Status: AC | PRN
  Administered 2020-11-06: 500 [IU]

## 2020-11-06 MED ORDER — SODIUM CHLORIDE 0.9 % IV SOLN
150.0000 mg | Freq: Once | INTRAVENOUS | Status: AC
  Administered 2020-11-06: 150 mg via INTRAVENOUS
  Filled 2020-11-06: qty 150

## 2020-11-06 MED ORDER — SODIUM CHLORIDE 0.9% FLUSH
10.0000 mL | INTRAVENOUS | Status: AC | PRN
  Administered 2020-11-06: 10 mL

## 2020-11-06 NOTE — Patient Instructions (Signed)
[facility]  Discharge Instructions: Thank you for choosing [facility] to provide your oncology and hematology care.   If you have a lab appointment with the [facility], please go directly to the [facility] and check in at the registration area.   Wear comfortable clothing and clothing appropriate for easy access to any Portacath or PICC line.   We strive to give you quality time with your provider. You may need to reschedule your appointment if you arrive late (15 or more minutes).  Arriving late affects you and other patients whose appointments are after yours.  Also, if you miss three or more appointments without notifying the office, you may be dismissed from the clinic at the provider's discretion.      For prescription refill requests, have your pharmacy contact our office and allow 72 hours for refills to be completed.    Today you received the following chemotherapy and/or immunotherapy agents: Herceptin, docetaxel, carboplatin.     To help prevent nausea and vomiting after your treatment, we encourage you to take your nausea medication as directed.  BELOW ARE SYMPTOMS THAT SHOULD BE REPORTED IMMEDIATELY: *FEVER GREATER THAN 100.4 F (38 C) OR HIGHER *CHILLS OR SWEATING *NAUSEA AND VOMITING THAT IS NOT CONTROLLED WITH YOUR NAUSEA MEDICATION *UNUSUAL SHORTNESS OF BREATH *UNUSUAL BRUISING OR BLEEDING *URINARY PROBLEMS (pain or burning when urinating, or frequent urination) *BOWEL PROBLEMS (unusual diarrhea, constipation, pain near the anus) TENDERNESS IN MOUTH AND THROAT WITH OR WITHOUT PRESENCE OF ULCERS (sore throat, sores in mouth, or a toothache) UNUSUAL RASH, SWELLING OR PAIN  UNUSUAL VAGINAL DISCHARGE OR ITCHING   Items with * indicate a potential emergency and should be followed up as soon as possible or go to the Emergency Department if any problems should occur.  Please show the CHEMOTHERAPY ALERT CARD or IMMUNOTHERAPY  ALERT CARD at check-in to the Emergency Department and triage nurse.  Should you have questions after your visit or need to cancel or reschedule your appointment, please contact [facility]  Dept: [phone]  and follow the prompts.  Office hours are 8:00 a.m. to 4:30 p.m. Monday - Friday. Please note that voicemails left after 4:00 p.m. may not be returned until the following business day.  We are closed weekends and major holidays. You have access to a nurse at all times for urgent questions. Please call the main number to the clinic Dept: [phone] and follow the prompts.   For any non-urgent questions, you may also contact your provider using MyChart. We now offer e-Visits for anyone 57 and older to request care online for non-urgent symptoms. For details visit [email or web address].   Also download the MyChart app! Go to the app store, search "MyChart", open the app, select [facility], and log in with your MyChart username and password.  Due to Covid, a mask is required upon entering the hospital/clinic. If you do not have a mask, one will be given to you upon arrival. For doctor visits, patients may have 1 support person aged 18 or older with them. For treatment visits, patients cannot have anyone with them due to current Covid guidelines and our immunocompromised population.

## 2020-11-06 NOTE — Progress Notes (Signed)
[name]  [phone] [phone]     ID: Kayla Little DOB: October 17, 1985  MR#: [id number]  TWS#:[id number]  Patient Care Team: [name] [place] as PCP - General (Family Medicine) Magrinat, Virgie Dad, MD as Consulting Physician (Oncology) Kyung Rudd, MD as Consulting Physician (Radiation Oncology) Armandina Gemma, MD as Consulting Physician (General Surgery) Rockwell Germany, RN as Registered Nurse Mauro Kaufmann, RN as Registered Nurse Scot Dock, NP OTHER MD:  CHIEF COMPLAINT: Triple positive locally advanced breast cancer  CURRENT TREATMENT: Neoadjuvant chemotherapy   INTERVAL HISTORY: Kayla Little returns today for follow up and treatment of her locally advanced breast cancer. She was evaluated in the breast cancer clinic on 07/31/2020.  She is here today to receive cycle 6 day 1 of Docetaxel, carboplatin and Trastuzumab.  She was receiving Pertuzmab, however that was discontinued after cycle 1 due to diarrhea and dehydration.   She underwent echo that showed an EF of 65-70%.    Kayla Little continues to do well with treatment. She has some fatigue the first few days following chemotherapy, however otherwise is doing quite well.  Her breast MRI is scheduled for tomorrow and her f/u with Dr. Barry Dienes on 11/12/2020.     REVIEW OF SYSTEMS: Review of Systems  Constitutional:  Positive for fatigue. Negative for appetite change, chills, fever and unexpected weight change.  HENT:   Negative for hearing loss, lump/mass and trouble swallowing.   Eyes:  Negative for eye problems and icterus.  Respiratory:  Negative for chest tightness, cough and shortness of breath.   Cardiovascular:  Negative for chest pain, leg swelling and palpitations.  Gastrointestinal:  Negative for abdominal distention, abdominal pain, constipation, diarrhea, nausea and vomiting.  Endocrine: Negative for hot flashes.  Genitourinary:  Negative for difficulty urinating.    Musculoskeletal:  Negative for arthralgias.  Skin:  Negative for itching and rash.  Neurological:  Negative for dizziness, extremity weakness, headaches and numbness.  Hematological:  Negative for adenopathy. Does not bruise/bleed easily.  Psychiatric/Behavioral:  Negative for depression. The patient is not nervous/anxious.      COVID 19 VACCINATION STATUS: Status post Moderna x2 followed by [employer] booster   HISTORY OF CURRENT ILLNESS: From the original intake note:  Kayla Pro "[name]" (the daughter of my patient, Kayla Little, whom I saw earlier today). has a history of medullary thyroid cancer, status post total thyroidectomy on 04/12/2019 under Dr. Harlow Asa.   More recently she presented to her GYN with a right supraclavicular lump, prompting neck and chest CT on 06/24/2020. Neck CT showed: no recurrent mass in thyroid bed, palpable abnormality corresponds to a cluster of 2 lymph nodes in right supraclavicular region; prominent lym ph nodes in neck bilaterally. Chest CT showed: enhancing 1.4 cm lesion in medial right breast with associated mildly enlarged right axillary lymph nodes; 1.2 cm lesion in left hepatic lobe with central hyperattenuation; enlargement of right infrahilar and right hilar nodal tissue is borderline suspicious; sclerotic lesion with spiculated margins at T7 level favored to represent a bone island.  She underwent bilateral diagnostic mammography with tomography and bilateral breast ultrasonography at [facility] on 06/27/2020 showing: breast density category D; Right Breast 1. 2.6 cm irregular mass in inner breast, with associated microcalcifications spanning up to 5.1 cm, corresponding to chest CT finding. 2. Adjacent 0.9 cm mass in inner breast, 2 cm away from dominant mass. 3. Two indeterminate masses in outer breast. 4. At least 3 enlarged lymph nodes in right  axilla  Left Breast 1. Indeterminate 1.2 cm mass in lower-outer breast, possibly a fibroadenoma 2. No enlarged  lymphadenopathy in left axilla.  Accordingly on 06/30/2020 she proceeded to biopsy of the right breast area in question. The pathology from this procedure ([id number]) showed:  1. Dominant mass  - invasive ductal carcinoma, grade 1  - ductal carcinoma in situ  - Prognostic indicators significant for: estrogen receptor, 90% positive and progesterone receptor, 80% positive, both with moderate staining intensity.  HER2 equivocal by immunohistochemistry (2+), but positive by FISH, with a signals ratio of 2.2 and the number per cell greater than 5.. 2. Smaller mass  - invasive ductal carcinoma, grade 1  - ductal carcinoma in situ  - Prognostic indicators significant for: estrogen receptor, 100% positive with strong staining intensity and progesterone receptor, 50% positive with moderate staining intensity.  HER2 positive by immunohistochemistry (3+). 3. Right axillary lymph node  - invasive ductal carcinoma, grade 1 Prognostic indicators significant for: estrogen receptor, 100% positive and progesterone receptor, 90% positive, both with strong staining intensity. HER2 equivocal by immunohistochemistry (2+), {but (positive or negative) by fluorescent in situ hybridization   Biopsy of the left breast area was performed the same day. Pathology from the procedure ([id number]) was benign.  She underwent breast MRI on 07/06/2020 showing: breast composition C; enhancing mass with adjacent non-mass enhancement in lower-inner right breast, which together measure 5.4 cm, corresponding with two biopsy-proven areas of malignancy; no other suspicious enhancement identified in right breast; one 4 mm right internal mammary lymph node with unclear significance; numerous abnormal right axillary lymph nodes; no evidence of left breast malignancy.  Cancer Staging Malignant neoplasm of upper-inner quadrant of right breast in female, estrogen receptor positive ([place]) Staging form: Breast, AJCC 8th Edition - Clinical stage  from 07/08/2020: Stage IIIA (cT3, cN3, cM0, G1, ER+, PR+, HER2+) - Unsigned Stage prefix: Initial diagnosis Histologic grading system: 3 grade system  The patient's subsequent history is as detailed below.   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)    Hx of varicella    Kidney stone 2012   Medullary thyroid carcinoma ([place])    Monoallelic mutation of CHEK2 gene in female patient    Postpartum care following vaginal delivery (7/27) 10/02/2014, June 16 , 2020   Thyroid cancer [facility]) 2021    PAST SURGICAL HISTORY: Past Surgical History:  Procedure Laterality Date   IR IMAGING GUIDED PORT INSERTION  07/22/2020   NO PAST SURGERIES     THYROIDECTOMY N/A 04/12/2019   Procedure: TOTAL THYROIDECTOMY WITH LIMITED LYMPH NODE DISSECTION;  Surgeon: Armandina Gemma, MD;  Location: [facility];  Service: General;  Laterality: N/A;   UNILATERAL SALPINGECTOMY Right 07/09/2017   Procedure: RIGHT SALPINGECTOMY WITH REMOVAL OF ECTOPIC PREGNANCY;  Surgeon: Azucena Fallen, MD;  Location: [facility];  Service: Gynecology;  Laterality: Right;    FAMILY HISTORY: Family History  Problem Relation Age of Onset   Breast cancer Mother 49       2 types of cancer one side mastectomy with chemo   Endometriosis Mother    Endometriosis Maternal Grandmother  Breast cancer Maternal Grandmother        post menopausal   Diabetes Maternal Grandmother    Cancer Maternal Grandfather        prostate and lymphoma   Alzheimer's disease Paternal Grandmother    Cancer Father 26       prostate cancer   Aneurysm Paternal Grandfather        d. 1s   Please see genetic counseling note dated 11/23/2018 for  full family information.   GYNECOLOGIC HISTORY:  No LMP recorded. (Menstrual status: IUD). Menarche: 35 years old Age at first live birth: 35 years old [place] P 2 Patient has a Mirena IUD in place HRT n/a  Hysterectomy? no BSO?  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  Her husband Barnabas Lister works for [employer].  Their children are Dylan 5 and Emma 2.  Barnabas Lister has a 67 year old daughter, the patient's stepdaughter Hildred Alamin, currently attending [employer].  The patient is a [employer]    ADVANCED DIRECTIVES: In the absence of any documents to the contrary the patient's husband is her healthcare power of attorney   HEALTH MAINTENANCE: Social History   Tobacco Use   Smoking status: Some Days    Packs/day: 0.50    Years: 5.00    Pack years: 2.50    Types: Cigarettes   Smokeless tobacco: Never   Tobacco comments:    Actively trying to quit.  Vaping Use   Vaping Use: Never used  Substance Use Topics   Alcohol use: Yes    Alcohol/week: 7.0 - 14.0 standard drinks    Types: 7 - 14 Glasses of wine per week    Comment: glass of wine each night    Drug use: Not Currently    Types: Marijuana    Comment: not during pregnancy, last use was 03/27/2019     Colonoscopy: n/a (age)  PAP:   Bone density: n/a (age)   No Known Allergies  Current Outpatient Medications  Medication Sig Dispense Refill   acetaminophen (TYLENOL) 325 MG tablet Take 2 tablets (650 mg total) by mouth every 4 (four) hours as needed (for pain scale < 4). 100 tablet 0   amoxicillin-clavulanate (AUGMENTIN) 875-125 MG tablet Take 1 tablet by mouth 2 (two) times daily. 10 tablet 0   calcitRIOL (ROCALTROL) 0.5 MCG capsule Take 1 capsule (0.5 mcg total) by mouth 2 (two) times a week. 30 capsule 1   dexamethasone (DECADRON) 4 MG tablet Take 2 tablets (8 mg total) by mouth 2 (two) times daily. Start the day before Taxotere.  Then take daily x 3 days after chemotherapy. 30 tablet 1   levonorgestrel (MIRENA) 20 MCG/24HR IUD 1 each by Intrauterine route once.     levothyroxine (SYNTHROID) 175 MCG tablet Take 175 mcg by mouth daily before breakfast.     lidocaine-prilocaine (EMLA) cream Apply to affected area once (Patient taking differently: Apply 1 application topically as needed (port access).) 30 g 3   liothyronine (CYTOMEL) 5 MCG tablet Take 5 mcg by mouth daily.     loperamide (IMODIUM) 2 MG capsule Take 2 mg by mouth as needed for diarrhea or loose stools.     loratadine (CLARITIN) 10 MG tablet Take 1 tablet (10 mg total) by mouth daily. 90 tablet 0   LORazepam (ATIVAN) 0.5 MG tablet Take 1 tablet (0.5 mg total) by mouth every 12 (twelve) hours as needed for anxiety. 30 tablet  0   ondansetron (ZOFRAN) 8 MG tablet Take 1 tablet (8 mg total) by mouth 2 (two) times daily as needed (Nausea or vomiting). Start on the third day after chemotherapy. 30 tablet 1   prochlorperazine (COMPAZINE) 10 MG tablet Take 1 tablet (10 mg total) by mouth every 6 (six) hours as needed (Nausea or vomiting). 30 tablet 1   sertraline (ZOLOFT) 50 MG tablet Take 50 mg by mouth at bedtime.     valACYclovir (VALTREX) 500 MG tablet Take 1 tablet (500 mg total) by mouth daily. 90 tablet 0   VYVANSE 60 MG capsule Take 60 mg by mouth every morning.     No current facility-administered medications for this visit.    OBJECTIVE:  Vitals:   11/06/20 1100  BP: (!) 144/82  Pulse: (!) 114  Resp: 18  Temp: (!) 97.5 F (36.4 C)  SpO2: 100%      Body mass index is 26.74 kg/m.   Wt Readings from Last 3 Encounters:  11/06/20 165 lb 11.2 oz (75.2 kg)  10/16/20 163 lb 9.6 oz (74.2 kg)  09/25/20 160 lb 3.2 oz (72.7 kg)  ECOG FS:1 - Symptomatic but completely ambulatory GENERAL: Patient is a well appearing female in no acute distress HEENT:  Sclerae anicteric.  Oropharynx clear and moist. No ulcerations or evidence of oropharyngeal candidiasis.  Neck is supple.  NODES:  No cervical, supraclavicular, or axillary lymphadenopathy palpated.  BREAST EXAM: no sign of progression LUNGS:  Clear to auscultation bilaterally.  No wheezes or rhonchi. HEART:  Regular rate and rhythm. No murmur appreciated. ABDOMEN:  Soft, nontender.  Positive, normoactive bowel sounds. No organomegaly palpated. MSK:  No focal spinal tenderness to palpation. Full range of motion bilaterally in the upper extremities. EXTREMITIES:  No peripheral edema.   SKIN:  Clear with no obvious rashes or skin changes. No nail dyscrasia. NEURO:  Nonfocal. Well oriented.  Appropriate affect.    LAB RESULTS:  CMP     Component Value Date/Time   NA 140 10/31/2020 1016   NA 142 02/16/2017 1332   K 3.9 10/31/2020 1016   CL 107 10/31/2020 1016   CO2 24 10/31/2020 1016   GLUCOSE 90 10/31/2020 1016   BUN 11 10/31/2020 1016   BUN 9 02/16/2017 1332   CREATININE 0.79 10/31/2020 1016   CREATININE 0.55 09/25/2020 1039   CREATININE 0.82 12/04/2012 1732   CALCIUM 8.0 (L) 10/31/2020 1016   PROT 7.0 10/31/2020 1016   PROT 6.5 02/16/2017 1332   ALBUMIN 4.2 10/31/2020 1016   ALBUMIN 4.3 02/16/2017 1332   AST 22 10/31/2020 1016   AST 20 09/25/2020 1039   ALT 32 10/31/2020 1016   ALT 27 09/25/2020 1039   ALKPHOS 84 10/31/2020 1016   BILITOT 0.5 10/31/2020 1016   BILITOT 0.7 09/25/2020 1039   GFRNONAA >60 10/31/2020 1016   GFRNONAA >60 09/25/2020 1039   GFRAA >60 04/15/2019 2145    No results found for: TOTALPROTELP, ALBUMINELP, A1GS, A2GS, BETS, BETA2SER, GAMS, MSPIKE, SPEI  Lab Results  Component Value Date   WBC 13.0 (H) 10/31/2020   NEUTROABS 9.6 (H) 10/31/2020   HGB 10.8 (L) 10/31/2020   HCT 31.8 (L) 10/31/2020   MCV 106.4 (H) 10/31/2020   PLT 230 10/31/2020    No results found for: LABCA2  No components found for: [id number]  No results for input(s): INR in the last 168 hours.  No results found for: LABCA2  No results found for: IHK742  No results  found for:  IZT245  No results found for: YKD983  Lab Results  Component Value Date   CA2729 15.9 07/30/2020    No components found for: HGQUANT  Lab Results  Component Value Date   CEA1 5.17 (H) 07/30/2020   /  CEA ([facility])  Date Value Ref Range Status  07/30/2020 5.17 (H) 0.00 - 5.00 ng/mL Final    Comment:    (NOTE) This test was performed using Architect's Chemiluminescent Microparticle Immunoassay. Values obtained from different assay methods cannot be used interchangeably. Please note that 5-10% of patients who smoke may see CEA levels up to 6.9 ng/mL. Performed at [facility], [place] [address], [place], [place] [address]      No results found for: AFPTUMOR  No results found for: CHROMOGRNA  No results found for: KPAFRELGTCHN, LAMBDASER, KAPLAMBRATIO (kappa/lambda light chains)  No results found for: HGBA, HGBA2QUANT, HGBFQUANT, HGBSQUAN (Hemoglobinopathy evaluation)   No results found for: LDH  No results found for: IRON, TIBC, IRONPCTSAT (Iron and TIBC)  No results found for: FERRITIN  Urinalysis    Component Value Date/Time   COLORURINE RED (A) 10/18/2020 1107   APPEARANCEUR HAZY (A) 10/18/2020 1107   LABSPEC 1.009 10/18/2020 1107   PHURINE 8.0 10/18/2020 1107   GLUCOSEU NEGATIVE 10/18/2020 1107   HGBUR LARGE (A) 10/18/2020 1107   BILIRUBINUR NEGATIVE 10/18/2020 1107   BILIRUBINUR small (A) 02/16/2017 1216   BILIRUBINUR small 12/04/2012 Finger 10/18/2020 1107   PROTEINUR 30 (A) 10/18/2020 1107   UROBILINOGEN 0.2 02/16/2017 1216   UROBILINOGEN 0.2 09/18/2013 1555   NITRITE NEGATIVE 10/18/2020 1107   Pax 10/18/2020 1107    STUDIES: ECHOCARDIOGRAM COMPLETE  Result Date: 11/04/2020    ECHOCARDIOGRAM REPORT   Patient Name:   RYLEI MASELLA Date of Exam: 11/04/2020 Medical Rec #:  [id number]    Height:       66.0 in Accession #:    [id number]   Weight:       163.6 lb Date of Birth:   04-22-1985    BSA:          1.836 m Patient Age:    35 years     BP:           119/79 mmHg Patient Gender: F            HR:           99 bpm. Exam Location:  Outpatient Procedure: 2D Echo, Cardiac Doppler, Color Doppler and Strain Analysis Indications:    Chemo Z09  History:        Patient has prior history of Echocardiogram examinations, most                 recent 07/23/2020.  Sonographer:    Bernadene Person RDCS Referring Phys: [place]  1. Global longitudinal strain is -21.7%. Left ventricular ejection fraction, by estimation, is 65 to 70%. The left ventricle has normal function. The left ventricle has no regional wall motion abnormalities. Left ventricular diastolic parameters were normal.  2. Right ventricular systolic function is normal. The right ventricular size is normal.  3. The mitral valve is normal in structure. Trivial mitral valve regurgitation.  4. The aortic valve is normal in structure. Aortic valve regurgitation is not visualized.  5. The inferior vena cava is normal in size with greater than 50% respiratory variability, suggesting right atrial pressure of 3 mmHg. Comparison(s): The left ventricular function is unchanged. FINDINGS  Left Ventricle: Global longitudinal strain is -21.7%. Left ventricular ejection fraction, by estimation, is 65 to 70%. The left ventricle has normal function. The left ventricle has no regional wall motion abnormalities. The left ventricular internal cavity size was normal in size. There is no left ventricular hypertrophy. Left ventricular diastolic parameters were normal. Right Ventricle: The right ventricular size is normal. Right vetricular wall thickness was not assessed. Right ventricular systolic function is normal. Left Atrium: Left atrial size was normal in size. Right Atrium: Right atrial size was normal in size. Pericardium: There is no evidence of pericardial effusion. Mitral Valve: The mitral valve is normal in structure.  Trivial mitral valve regurgitation. Tricuspid Valve: The tricuspid valve is normal in structure. Tricuspid valve regurgitation is trivial. Aortic Valve: The aortic valve is normal in structure. Aortic valve regurgitation is not visualized. Pulmonic Valve: The pulmonic valve was not well visualized. Pulmonic valve regurgitation is not visualized. No evidence of pulmonic stenosis. Aorta: The aortic root and ascending aorta are structurally normal, with no evidence of dilitation. Venous: The inferior vena cava is normal in size with greater than 50% respiratory variability, suggesting right atrial pressure of 3 mmHg. IAS/Shunts: No atrial level shunt detected by color flow Doppler.  LEFT VENTRICLE PLAX 2D LVIDd:         4.90 cm  Diastology LVIDs:         3.10 cm  LV e' medial:    9.73 cm/s LV PW:         0.90 cm  LV E/e' medial:  7.2 LV IVS:        0.70 cm  LV e' lateral:   10.70 cm/s LVOT diam:     1.90 cm  LV E/e' lateral: 6.5 LV SV:         48 LV SV Index:   26 LVOT Area:     2.84 cm  RIGHT VENTRICLE RV S prime:     13.10 cm/s TAPSE (M-mode): 1.8 cm LEFT ATRIUM             Index       RIGHT ATRIUM           Index LA diam:        2.70 cm 1.47 cm/m  RA Area:     10.70 cm LA Vol (A2C):   32.3 ml 17.59 ml/m RA Volume:   22.40 ml  12.20 ml/m LA Vol (A4C):   36.4 ml 19.82 ml/m LA Biplane Vol: 35.6 ml 19.39 ml/m  AORTIC VALVE LVOT Vmax:   104.00 cm/s LVOT Vmean:  69.400 cm/s LVOT VTI:    0.171 m  AORTA Ao Root diam: 3.20 cm Ao Asc diam:  2.80 cm MITRAL VALVE MV Area (PHT): 5.75 cm    SHUNTS MV Decel Time: 132 msec    Systemic VTI:  0.17 m MV E velocity: 69.80 cm/s  Systemic Diam: 1.90 cm MV A velocity: 66.40 cm/s MV E/A ratio:  1.05 Dorris Carnes MD Electronically signed by Dorris Carnes MD Signature Date/Time: 11/04/2020/10:06:15 PM    Final       ELIGIBLE FOR AVAILABLE RESEARCH PROTOCOL:   ASSESSMENT: 35 y.o. [place] woman with a clinical mT2 N3, stage IIIA invasive ductal carcinoma, grade 1, triple  positive  (a) breast MRI 07/06/2020 finds a 1.6 cm lower inner quadrant mass associated with 5.4 cm of non-mass-like enhancement, at least 8 enlarged right axillary lymph node and 1 right internal mammary lymph node  (b) CT scans of the neck  and chest 06/24/2020 shows subcentimeter right cervical adenopathy, 2 right supraclavicular lymph nodes the larger 0.65 cm, also left cervical adenopathy largest measuring 1.02 cm; there is also a 1.2 cm lesion in the left hepatic lobe, and a sclerotic lesion in the mid thoracic spine at T7  (c) PET scan 07/21/2020 shows regional adenopathy (including 2 right subcentimeter supraclavicular lymph nodes and subcentimeter lymph nodes in the right hilar, subcarinal and lateral aortic region), no uptake in liver, and an indeterminate T12 lesion  (d) thoracic spine MRI 07/27/2020 shows the T12 lesion to be likely benign, there is also degenerative disc disease  (1) status post total thyroidectomy and central compartment lymph node biopsy 04/12/2019 for a pT1a pN0 medullary thyroid carcinoma, with negative margins, no angioinvasion, and no extrathyroidal extension  (a) CEA and calcitonin   (2) genetics testing 10/27/2018 showed   (a) 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 21 days x 6, starting 07/23/2020  (4) anti-HER2 immunotherapy to continue to total 1 year  (a) echo 07/22/2020 shows an ejection fraction in the 60-65% range.  (B) echo on 11/04/2020 shows an EF of 65-70%  (5) definitive surgery to follow  (6) adjuvant radiation  (7) antiestrogens   PLAN:  Kayla Little is doing well today.  She will proceed with  cycle six of Docetaxel, Carboplatin, and Trastuzumab.  She is tolerating this quite well.  She has no signs of peripheral neuropathy.    She will continue with Trastuzumab every 3 weeks, and her most recent echo was stable.    Kayla Little has upcoming breast MRI and surgery f/u with Dr. Barry Dienes on 11/12/2020.  Overall Kayla Little is feeling well and is happy to be finishing up the chemotherapy portion of her treatment.  She is going to return in 3 weeks.  During that visit I will ask Dr. Jana Hakim to pop in and discuss his upcoming retirement.  She knows to call for any questions that may arise between now and her next appointment.  We are happy to see her sooner if needed.  Total encounter time: 20 minutes* in face to face visit time, chart review, lab review, coordination of care, and documentation of the encounter.   Wilber Bihari, NP 11/06/20 11:09 AM Medical Oncology and Hematology [facility] [place], [place] [address] Tel. [phone]    Fax. [phone]    *Total Encounter Time as defined by the Centers for Medicare and Medicaid Services includes, in addition to the face-to-face time of a patient visit (documented in the note above) non-face-to-face time: obtaining and reviewing outside history, ordering and reviewing medications, tests or procedures, care coordination (communications with other health care professionals or caregivers) and documentation in the medical record.

## 2020-11-07 ENCOUNTER — Ambulatory Visit (HOSPITAL_COMMUNITY)
Admission: RE | Admit: 2020-11-07 | Discharge: 2020-11-07 | Disposition: A | Discharge status: Home / Self Care | Payer: 59 | Source: Ambulatory Visit | Attending: Adult Health | Admitting: Adult Health

## 2020-11-07 DIAGNOSIS — Z170 Estrogen receptor positive status [ER+]: Secondary | ICD-10-CM | POA: Diagnosis present

## 2020-11-07 DIAGNOSIS — C50211 Malignant neoplasm of upper-inner quadrant of right female breast: Secondary | ICD-10-CM | POA: Diagnosis present

## 2020-11-07 LAB — CALCIUM, IONIZED: Calcium, Ionized, Serum: 4.8 mg/dL (ref 4.5–5.6)

## 2020-11-07 IMAGING — MR MR BREAST BILAT WO/W CM
8 of 11 series · 26 of 48 positions shown · IV contrast (GADAVIST 7.4 ml)
Comparison: Breast MRI dated [DATE].

CLINICAL DATA: Breast cancer, assess treatment response. Status
post neoadjuvant chemotherapy.
TECHNIQUE: Multiplanar, multisequence MR images of both breasts were obtained
prior to and following the intravenous administration of 7 ml of
Gadavist

[Series 2: T2 · axial · 3.0mm · 0.91mm/px · 1 of 65 slices shown]
[im 1/65]
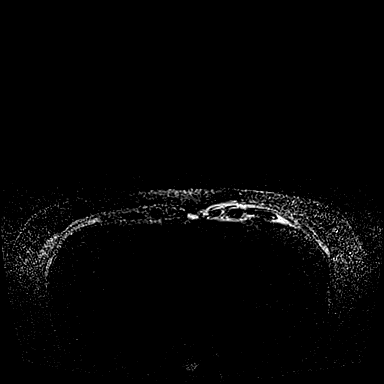

[Series 3: T1 fat-sat · axial · 1.2mm · 0.78mm/px · z∈[-152,+38]mm · 5 of 160 slices shown (1 of 4)]
[im 1/160]
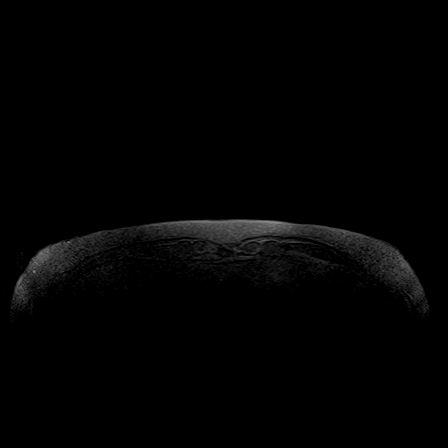
[im 40/160]
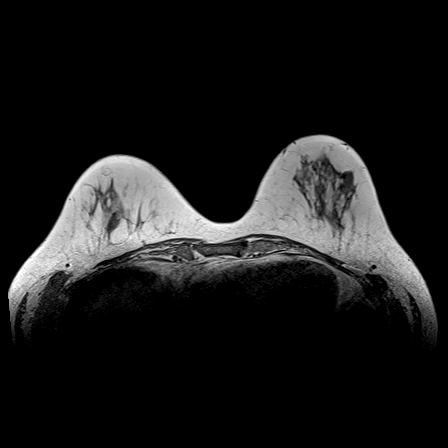
[im 80/160]
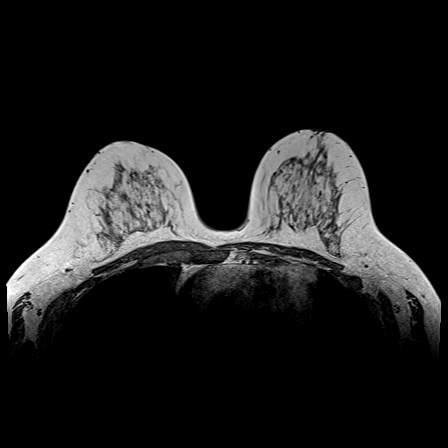
[im 120/160]
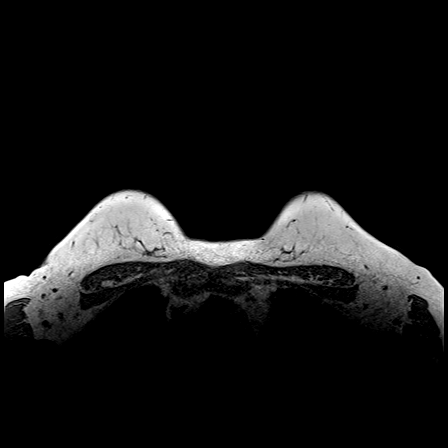
[im 160/160]
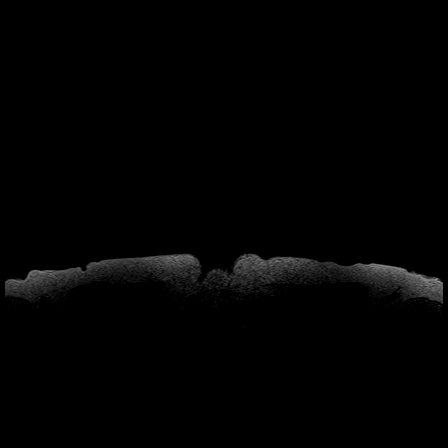

[Series 5: T1 fat-sat · axial · 1.2mm · 0.84mm/px · z∈[-152,+38]mm · 5 of 160 slices shown (2 of 4)]
[im 1/160]
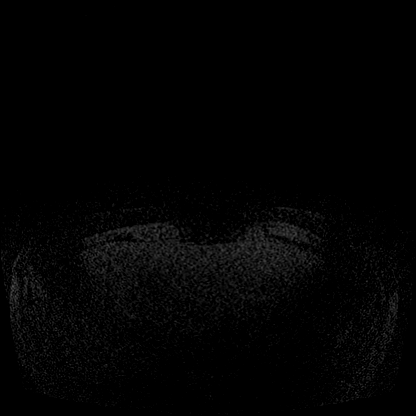
[im 40/160]
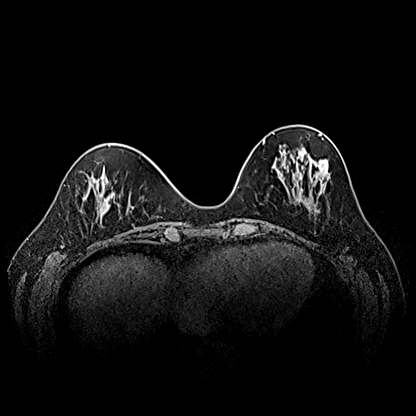
[im 80/160]
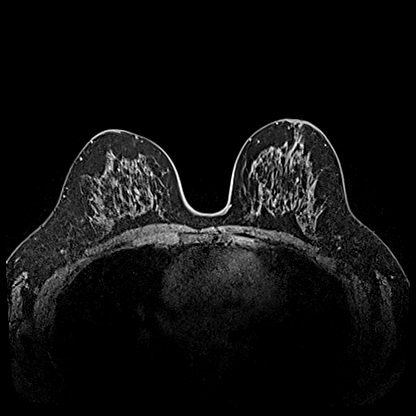
[im 120/160]
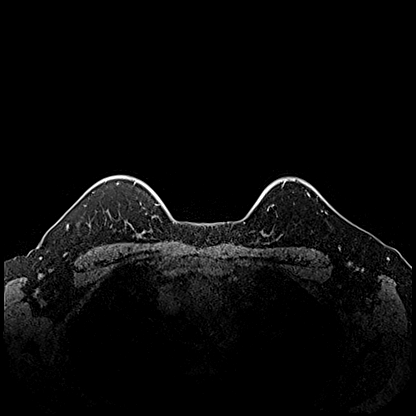
[im 160/160]
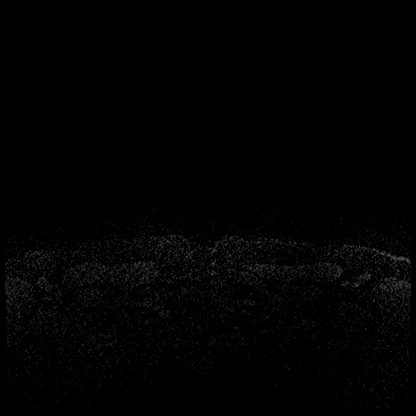

[Series 6: T1 fat-sat · axial · 1.2mm · 0.84mm/px · z∈[-152,+38]mm · 5 of 160 slices shown (3 of 4)]
[im 1/160]
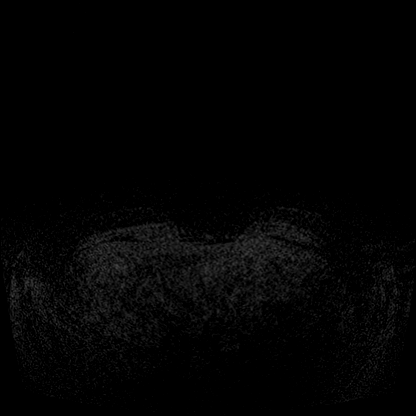
[im 40/160]
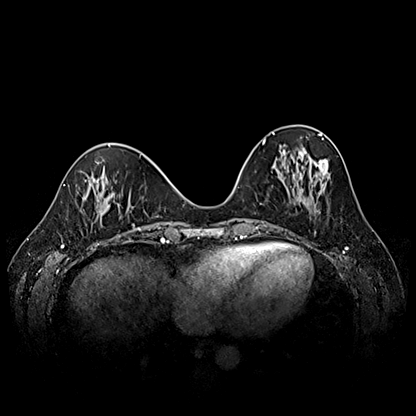
[im 80/160]
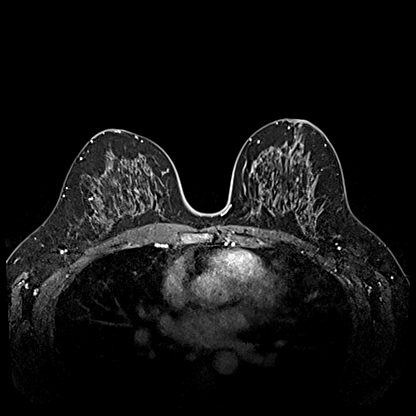
[im 120/160]
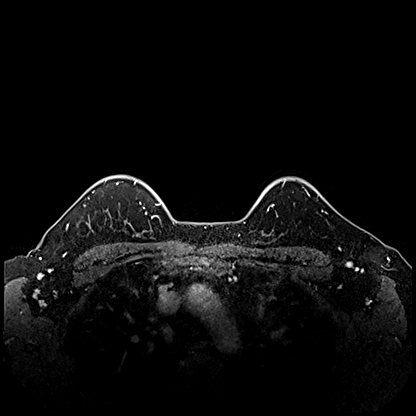
[im 160/160]
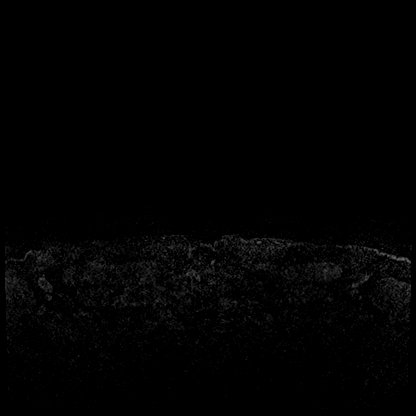

[Series 7: T1 · axial · 1.2mm · 0.84mm/px · z∈[-152,+38]mm · 6 of 160 slices shown (1 of 3)]
[im 1/160]
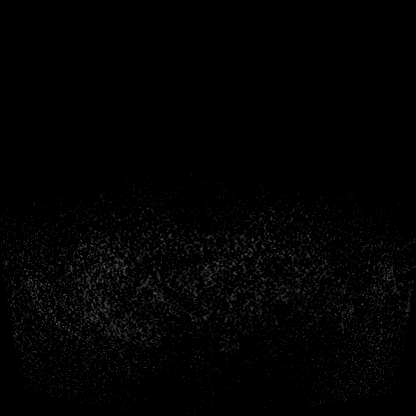
[im 32/160]
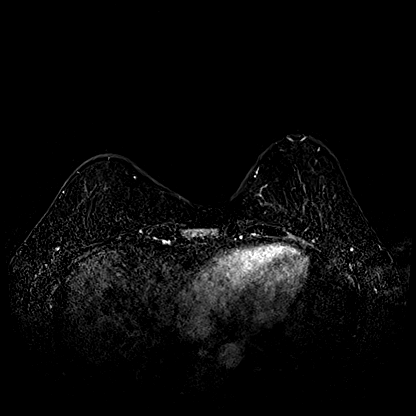
[im 64/160]
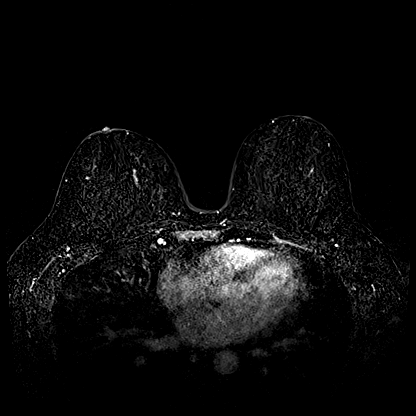
[im 96/160]
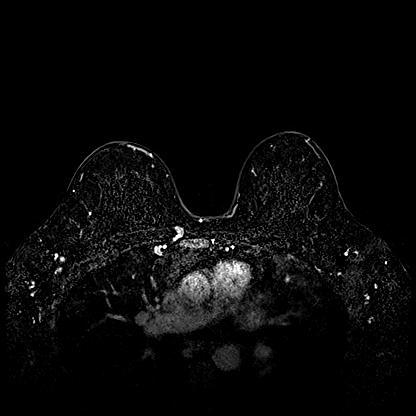
[im 128/160]
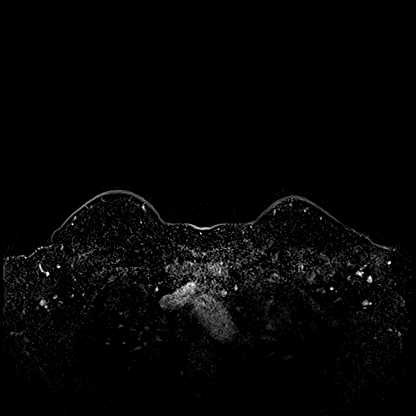
[im 160/160]
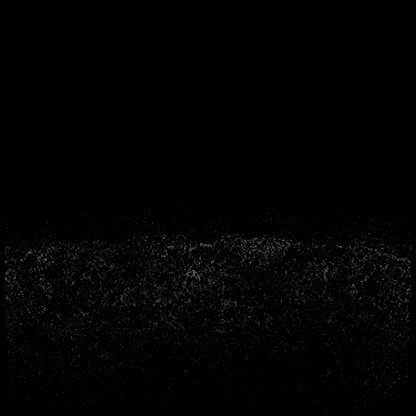

[Series 8: T1 · coronal · 350.0mm · 0.84mm/px · 1 of 3 slices shown (2 of 3)]
[im 1/3]
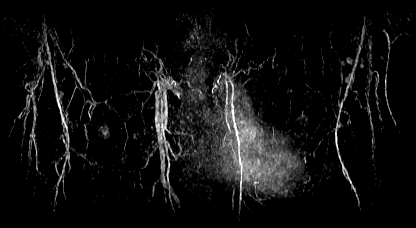

[Series 9: T1 · axial · 192.0mm · 0.84mm/px · 1 of 3 slices shown (3 of 3)]
[im 1/3]
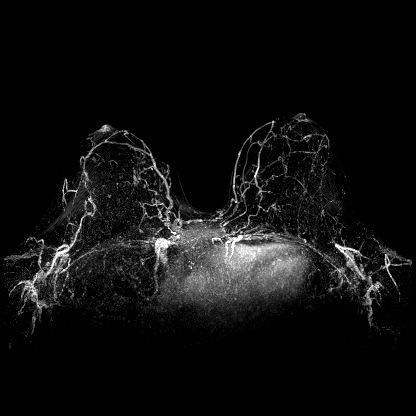

[Series 10: T1 fat-sat · axial · 1.2mm · 0.84mm/px · z∈[-152,-115]mm · 2 of 160 slices shown (4 of 4)]
[im 1/160]
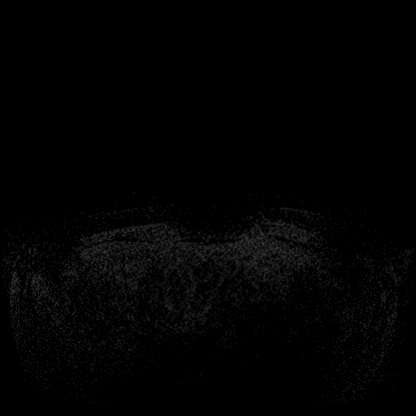
[im 32/160]
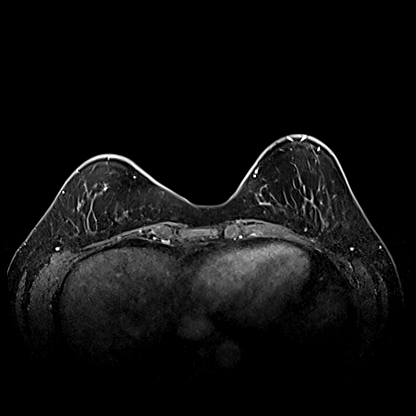

[26 of 48 positions shown; findings below may reference images not displayed]

34-year-old female with diagnosis of RIGHT breast cancer, originally
identified on chest CT performed on [DATE]. Status post 2
ultrasound-guided biopsies of masses in the medial RIGHT breast,
both demonstrating invasive ductal carcinoma. Also, a RIGHT axillary
mass was biopsied showing invasive ductal carcinoma with no residual
nodal tissue, compatible with additional primary focus or an
entirely replaced metastatic lymph node.

Status post benign LEFT breast biopsy.

LABS:  Not performed at imaging site.

EXAM:
BILATERAL BREAST MRI WITH AND WITHOUT CONTRAST
Three-dimensional MR images were rendered by post-processing of the
original MR data on an independent workstation. The
three-dimensional MR images were interpreted, and findings are
reported in the following complete MRI report for this study. Three
dimensional images were evaluated at the independent interpreting
workstation using the DynaCAD thin client.
FINDINGS: Breast composition: c. Heterogeneous fibroglandular tissue.

Background parenchymal enhancement: Mild

Right breast: Persistent non-mass enhancement within the inner RIGHT
breast, measuring 5.2 x 2.9 cm, not significantly changed in extent
but significantly decreased in volume of enhancement and with
decreased signal intensity indicating a good response to interval
treatment (but not a complete imaging response), with 2 associated
biopsy clips. Specifically, the dominant irregular mass within the
inner RIGHT breast is significantly reduced in size, with only a
thin component remaining.

However, there is new focal non-mass enhancement within the lower
outer quadrant of the RIGHT breast, measuring 9 mm, with persistent
enhancement kinetics (series 11, image 109).

Left breast: No suspicious enhancing mass, non-mass enhancement or
secondary signs of malignancy within the LEFT breast.

Biopsy clip artifact within the lower outer quadrant of the LEFT
breast, corresponding to an earlier benign biopsy site (by outside
report).

Lymph nodes: There are now no enlarged lymph nodes identified in the
RIGHT axilla, indicating a significant improvement. One of the lymph
nodes in the lower RIGHT axilla contains a biopsy clip,
corresponding to the earlier biopsy site revealing invasive
carcinoma (presumed axillary lymph node metastasis with complete
replacement of the lymph node), significantly decreased in size
compared to the previous MRI.

There are no enlarged lymph nodes identified within the LEFT axilla.

The previously demonstrated 4 mm lymph node within the internal
mammary chain region also appears smaller on today's exam. No
enlarged lymph nodes are seen within the internal mammary chain
regions bilaterally.

Ancillary findings:  None.
IMPRESSION: 1. New focal non-mass enhancement within the lower outer quadrant of
the RIGHT breast, measuring 9 mm, with persistent enhancement
kinetics (series 11, image 109). This is nonspecific but could
represent new multicentric disease. If breast conservation surgery
for the RIGHT breast is being considered, recommend MRI-guided
biopsy.
2. Although there is persistent non-mass enhancement within the
inner RIGHT breast, which includes 2 sites of biopsy-proven invasive
carcinoma with associated biopsy clips, the overall volume and
intensity of the non-mass enhancement is significantly reduced
indicating a good response to interval treatment.
3. There are now no enlarged lymph nodes identified in the RIGHT
axilla, also indicating a good response to interval treatment.
4. No MRI evidence of malignancy within the LEFT breast.

RECOMMENDATION:
1. If breast conservation surgery is being considered for the RIGHT
breast, MRI-guided biopsy is recommended for a new small (9 mm)
focal non-mass enhancement within the outer RIGHT breast. Per
previous reports, plan is for a RIGHT mastectomy. If so, no
additional biopsy is needed.
2. Otherwise, per current treatment plan for patient's known
biopsy-proven RIGHT breast invasive carcinoma.

BI-RADS CATEGORY  4: Suspicious.

## 2020-11-07 MED ORDER — GADOBUTROL 1 MMOL/ML IV SOLN
7.0000 mL | Freq: Once | INTRAVENOUS | Status: AC | PRN
  Administered 2020-11-07: 7 mL via INTRAVENOUS

## 2020-11-08 ENCOUNTER — Other Ambulatory Visit: Payer: Self-pay

## 2020-11-08 ENCOUNTER — Inpatient Hospital Stay: Payer: 59

## 2020-11-08 VITALS — BP 120/83 | HR 91 | Temp 97.7°F | Resp 20

## 2020-11-08 DIAGNOSIS — R112 Nausea with vomiting, unspecified: Secondary | ICD-10-CM

## 2020-11-08 DIAGNOSIS — C50211 Malignant neoplasm of upper-inner quadrant of right female breast: Secondary | ICD-10-CM

## 2020-11-08 DIAGNOSIS — Z5111 Encounter for antineoplastic chemotherapy: Secondary | ICD-10-CM | POA: Diagnosis not present

## 2020-11-08 DIAGNOSIS — Z170 Estrogen receptor positive status [ER+]: Secondary | ICD-10-CM

## 2020-11-08 MED ORDER — SODIUM CHLORIDE 0.9 % IV SOLN: Freq: Once | INTRAVENOUS | Status: AC

## 2020-11-08 MED ORDER — PEGFILGRASTIM-BMEZ 6 MG/0.6ML ~~LOC~~ SOSY
6.0000 mg | PREFILLED_SYRINGE | Freq: Once | SUBCUTANEOUS | Status: AC
  Administered 2020-11-08: 6 mg via SUBCUTANEOUS

## 2020-11-08 MED ORDER — HEPARIN SOD (PORK) LOCK FLUSH 100 UNIT/ML IV SOLN
500.0000 [IU] | Freq: Once | INTRAVENOUS | Status: AC
  Administered 2020-11-08: 500 [IU] via INTRAVENOUS

## 2020-11-08 MED ORDER — PEGFILGRASTIM-BMEZ 6 MG/0.6ML ~~LOC~~ SOSY
PREFILLED_SYRINGE | SUBCUTANEOUS | Status: AC
  Filled 2020-11-08: qty 0.6

## 2020-11-08 MED ORDER — SODIUM CHLORIDE 0.9% FLUSH
10.0000 mL | Freq: Once | INTRAVENOUS | Status: AC
  Administered 2020-11-08: 10 mL via INTRAVENOUS

## 2020-11-08 NOTE — Patient Instructions (Signed)
Rehydration, Adult Rehydration is the replacement of body fluids, salts, and minerals (electrolytes) that are lost during dehydration. Dehydration is when there is not enough water or other fluids in the body. This happens when you lose more fluids than you take in. Common causes of dehydration include: Not drinking enough fluids. This can occur when you are ill or doing activities that require a lot of energy, especially in hot weather. Conditions that cause loss of water or other fluids, such as diarrhea, vomiting, sweating, or urinating a lot. Other illnesses, such as fever or infection. Certain medicines, such as those that remove excess fluid from the body (diuretics). Symptoms of mild or moderate dehydration may include thirst, dry lips and mouth, and dizziness. Symptoms of severe dehydration may include increased heart rate, confusion, fainting, and not urinating. For severe dehydration, you may need to get fluids through an IV at the hospital. For mild or moderate dehydration, you can usually rehydrate at home by drinking certain fluids as told by your health care provider. What are the risks? Generally, rehydration is safe. However, taking in too much fluid (overhydration) can be a problem. This is rare. Overhydration can cause an electrolyte imbalance, kidney failure, or a decrease in salt (sodium) levels in the body. Supplies needed You will need an oral rehydration solution (ORS) if your health care provider tells you to use one. This is a drink to treat dehydration. It can be found in pharmacies and retail stores. How to rehydrate Fluids Follow instructions from your health care provider for rehydration. The kind of fluid and the amount you should drink depend on your condition. In general, you should choose drinks that you prefer. If told by your health care provider, drink an ORS. Make an ORS by following instructions on the package. Start by drinking small amounts, about  cup (120  mL) every 5-10 minutes. Slowly increase how much you drink until you have taken the amount recommended by your health care provider. Drink enough clear fluids to keep your urine pale yellow. If you were told to drink an ORS, finish it first, then start slowly drinking other clear fluids. Drink fluids such as: Water. This includes sparkling water and flavored water. Drinking only water can lead to having too little sodium in your body (hyponatremia). Follow the advice of your health care provider. Water from ice chips you suck on. Fruit juice with water you add to it (diluted). Sports drinks. Hot or cold herbal teas. Broth-based soups. Milk or milk products. Food Follow instructions from your health care provider about what to eat while you rehydrate. Your health care provider may recommend that you slowly begin eating regular foods in small amounts. Eat foods that contain a healthy balance of electrolytes, such as bananas, oranges, potatoes, tomatoes, and spinach. Avoid foods that are greasy or contain a lot of sugar. In some cases, you may get nutrition through a feeding tube that is passed through your nose and into your stomach (nasogastric tube, or NG tube). This may be done if you have uncontrolled vomiting or diarrhea. Beverages to avoid Certain beverages may make dehydration worse. While you rehydrate, avoid drinking alcohol. How to tell if you are recovering from dehydration You may be recovering from dehydration if: You are urinating more often than before you started rehydrating. Your urine is pale yellow. Your energy level improves. You vomit less frequently. You have diarrhea less frequently. Your appetite improves or returns to normal. You feel less dizzy or less light-headed. Your  skin tone and color start to look more normal. Follow these instructions at home: Take over-the-counter and prescription medicines only as told by your health care provider. Do not take sodium  tablets. Doing this can lead to having too much sodium in your body (hypernatremia). Contact a health care provider if: You continue to have symptoms of mild or moderate dehydration, such as: Thirst. Dry lips. Slightly dry mouth. Dizziness. Dark urine or less urine than normal. Muscle cramps. You continue to vomit or have diarrhea. Get help right away if you: Have symptoms of dehydration that get worse. Have a fever. Have a severe headache. Have been vomiting and the following happens: Your vomiting gets worse or does not go away. Your vomit includes blood or green matter (bile). You cannot eat or drink without vomiting. Have problems with urination or bowel movements, such as: Diarrhea that gets worse or does not go away. Blood in your stool (feces). This may cause stool to look black and tarry. Not urinating, or urinating only a small amount of very dark urine, within 6-8 hours. Have trouble breathing. Have symptoms that get worse with treatment. These symptoms may represent a serious problem that is an emergency. Do not wait to see if the symptoms will go away. Get medical help right away. Call your local emergency services (911 in the [place] Do not drive yourself to the hospital. Summary Rehydration is the replacement of body fluids and minerals (electrolytes) that are lost during dehydration. Follow instructions from your health care provider for rehydration. The kind of fluid and amount you should drink depend on your condition. Slowly increase how much you drink until you have taken the amount recommended by your health care provider. Contact your health care provider if you continue to show signs of mild or moderate dehydration. This information is not intended to replace advice given to you by your health care provider. Make sure you discuss any questions you have with your health care provider. Pegfilgrastim injection What is this medication? PEGFILGRASTIM (PEG fil gra  stim) is a long-acting granulocyte colony-stimulating factor that stimulates the growth of neutrophils, a type of white blood cell important in the body's fight against infection. It is used to reduce the incidence of fever and infection in patients with certain types of cancer who are receiving chemotherapy that affects the bone marrow, and to increase survival after being exposed to high doses of radiation. This medicine may be used for other purposes; ask your health care provider or pharmacist if you have questions. COMMON BRAND NAME(S): Rexene Edison, Ziextenzo What should I tell my care team before I take this medication? They need to know if you have any of these conditions: kidney disease latex allergy ongoing radiation therapy sickle cell disease skin reactions to acrylic adhesives (On-Body Injector only) an unusual or allergic reaction to pegfilgrastim, filgrastim, other medicines, foods, dyes, or preservatives pregnant or trying to get pregnant breast-feeding How should I use this medication? This medicine is for injection under the skin. If you get this medicine at home, you will be taught how to prepare and give the pre-filled syringe or how to use the On-body Injector. Refer to the patient Instructions for Use for detailed instructions. Use exactly as directed. Tell your healthcare provider immediately if you suspect that the On-body Injector may not have performed as intended or if you suspect the use of the On-body Injector resulted in a missed or partial dose. It is important that you put your  used needles and syringes in a special sharps container. Do not put them in a trash can. If you do not have a sharps container, call your pharmacist or healthcare provider to get one. Talk to your pediatrician regarding the use of this medicine in children. While this drug may be prescribed for selected conditions, precautions do apply. Overdosage: If you think you have  taken too much of this medicine contact a poison control center or emergency room at once. NOTE: This medicine is only for you. Do not share this medicine with others. What if I miss a dose? It is important not to miss your dose. Call your doctor or health care professional if you miss your dose. If you miss a dose due to an On-body Injector failure or leakage, a new dose should be administered as soon as possible using a single prefilled syringe for manual use. What may interact with this medication? Interactions have not been studied. This list may not describe all possible interactions. Give your health care provider a list of all the medicines, herbs, non-prescription drugs, or dietary supplements you use. Also tell them if you smoke, drink alcohol, or use illegal drugs. Some items may interact with your medicine. What should I watch for while using this medication? Your condition will be monitored carefully while you are receiving this medicine. You may need blood work done while you are taking this medicine. Talk to your health care provider about your risk of cancer. You may be more at risk for certain types of cancer if you take this medicine. If you are going to need a MRI, CT scan, or other procedure, tell your doctor that you are using this medicine (On-Body Injector only). What side effects may I notice from receiving this medication? Side effects that you should report to your doctor or health care professional as soon as possible: allergic reactions (skin rash, itching or hives, swelling of the face, lips, or tongue) back pain dizziness fever pain, redness, or irritation at site where injected pinpoint red spots on the skin red or dark-brown urine shortness of breath or breathing problems stomach or side pain, or pain at the shoulder swelling tiredness trouble passing urine or change in the amount of urine unusual bruising or bleeding Side effects that usually do not require  medical attention (report to your doctor or health care professional if they continue or are bothersome): bone pain muscle pain This list may not describe all possible side effects. Call your doctor for medical advice about side effects. You may report side effects to FDA at 1-800-FDA-1088. Where should I keep my medication? Keep out of the reach of children. If you are using this medicine at home, you will be instructed on how to store it. Throw away any unused medicine after the expiration date on the label. NOTE: This sheet is a summary. It may not cover all possible information. If you have questions about this medicine, talk to your doctor, pharmacist, or health care provider.  2022 [employer]vier/Gold Standard (2020-03-21 11:54:14)  Document Revised: 04/25/2019 Document Reviewed: 03/05/2019 Elsevier Patient Education  2022 [employer].

## 2020-11-11 ENCOUNTER — Encounter: Payer: Self-pay | Admitting: *Deleted

## 2020-11-12 ENCOUNTER — Ambulatory Visit: Payer: Self-pay | Admitting: General Surgery

## 2020-11-17 ENCOUNTER — Encounter: Payer: Self-pay | Admitting: *Deleted

## 2020-11-17 ENCOUNTER — Telehealth: Payer: Self-pay | Admitting: *Deleted

## 2020-11-17 NOTE — Telephone Encounter (Signed)
Pt scheduled for sx on 11/2. Per pt request for sx to be after 10/22.

## 2020-11-27 ENCOUNTER — Other Ambulatory Visit: Payer: Self-pay | Admitting: *Deleted

## 2020-11-27 ENCOUNTER — Inpatient Hospital Stay: Payer: 59

## 2020-11-27 ENCOUNTER — Other Ambulatory Visit: Payer: Self-pay | Admitting: Oncology

## 2020-11-27 ENCOUNTER — Encounter: Payer: Self-pay | Admitting: Adult Health

## 2020-11-27 ENCOUNTER — Inpatient Hospital Stay (HOSPITAL_BASED_OUTPATIENT_CLINIC_OR_DEPARTMENT_OTHER): Payer: 59 | Admitting: Adult Health

## 2020-11-27 ENCOUNTER — Other Ambulatory Visit: Payer: Self-pay

## 2020-11-27 VITALS — BP 117/68 | HR 115 | Temp 98.2°F | Resp 20 | Ht 66.0 in | Wt 170.8 lb

## 2020-11-27 DIAGNOSIS — C50211 Malignant neoplasm of upper-inner quadrant of right female breast: Secondary | ICD-10-CM | POA: Diagnosis not present

## 2020-11-27 DIAGNOSIS — Z170 Estrogen receptor positive status [ER+]: Secondary | ICD-10-CM

## 2020-11-27 DIAGNOSIS — Z5111 Encounter for antineoplastic chemotherapy: Secondary | ICD-10-CM | POA: Diagnosis not present

## 2020-11-27 LAB — CBC WITH DIFFERENTIAL/PLATELET
Abs Immature Granulocytes: 0.03 10*3/uL (ref 0.00–0.07)
Basophils Absolute: 0 10*3/uL (ref 0.0–0.1)
Basophils Relative: 0 %
Eosinophils Absolute: 0 10*3/uL (ref 0.0–0.5)
Eosinophils Relative: 0 %
HCT: 33.7 % — ABNORMAL LOW (ref 36.0–46.0)
Hemoglobin: 11.1 g/dL — ABNORMAL LOW (ref 12.0–15.0)
Immature Granulocytes: 0 %
Lymphocytes Relative: 20 %
Lymphs Abs: 1.9 10*3/uL (ref 0.7–4.0)
MCH: 36.6 pg — ABNORMAL HIGH (ref 26.0–34.0)
MCHC: 32.9 g/dL (ref 30.0–36.0)
MCV: 111.2 fL — ABNORMAL HIGH (ref 80.0–100.0)
Monocytes Absolute: 0.8 10*3/uL (ref 0.1–1.0)
Monocytes Relative: 9 %
Neutro Abs: 7 10*3/uL (ref 1.7–7.7)
Neutrophils Relative %: 71 %
Platelets: 292 10*3/uL (ref 150–400)
RBC: 3.03 MIL/uL — ABNORMAL LOW (ref 3.87–5.11)
RDW: 14.5 % (ref 11.5–15.5)
WBC: 9.9 10*3/uL (ref 4.0–10.5)
nRBC: 0 % (ref 0.0–0.2)

## 2020-11-27 LAB — COMPREHENSIVE METABOLIC PANEL
ALT: 18 U/L (ref 0–44)
AST: 18 U/L (ref 15–41)
Albumin: 4 g/dL (ref 3.5–5.0)
Alkaline Phosphatase: 56 U/L (ref 38–126)
Anion gap: 10 (ref 5–15)
BUN: 8 mg/dL (ref 6–20)
CO2: 23 mmol/L (ref 22–32)
Calcium: 8.4 mg/dL — ABNORMAL LOW (ref 8.9–10.3)
Chloride: 107 mmol/L (ref 98–111)
Creatinine, Ser: 0.75 mg/dL (ref 0.44–1.00)
GFR, Estimated: >60 mL/min (ref >60)
Glucose, Bld: 111 mg/dL — ABNORMAL HIGH (ref 70–99)
Potassium: 4.1 mmol/L (ref 3.5–5.1)
Sodium: 140 mmol/L (ref 135–145)
Total Bilirubin: 0.3 mg/dL (ref 0.3–1.2)
Total Protein: 6.7 g/dL (ref 6.5–8.1)

## 2020-11-27 MED ORDER — SODIUM CHLORIDE 0.9 % IV SOLN: Freq: Once | INTRAVENOUS | Status: AC

## 2020-11-27 MED ORDER — DIPHENHYDRAMINE HCL 25 MG PO CAPS
25.0000 mg | ORAL_CAPSULE | Freq: Once | ORAL | Status: AC
  Administered 2020-11-27: 25 mg via ORAL
  Filled 2020-11-27: qty 1

## 2020-11-27 MED ORDER — ACETAMINOPHEN 325 MG PO TABS
650.0000 mg | ORAL_TABLET | Freq: Once | ORAL | Status: AC
Start: 2020-11-27 — End: 2020-11-27
  Administered 2020-11-27: 650 mg via ORAL
  Filled 2020-11-27: qty 2

## 2020-11-27 MED ORDER — SODIUM CHLORIDE 0.9% FLUSH
10.0000 mL | INTRAVENOUS | Status: DC | PRN
Start: 2020-11-27 — End: 2020-11-27
  Administered 2020-11-27: 10 mL

## 2020-11-27 MED ORDER — TRASTUZUMAB-DKST CHEMO 150 MG IV SOLR
6.0000 mg/kg | Freq: Once | INTRAVENOUS | Status: AC
  Administered 2020-11-27: 462 mg via INTRAVENOUS
  Filled 2020-11-27: qty 22

## 2020-11-27 MED ORDER — HEPARIN SOD (PORK) LOCK FLUSH 100 UNIT/ML IV SOLN
500.0000 [IU] | Freq: Once | INTRAVENOUS | Status: AC | PRN
  Administered 2020-11-27: 500 [IU]

## 2020-11-27 NOTE — Progress Notes (Signed)
[place]  [phone] [phone]     ID: Kayla Little DOB: Mar 07, 1986  MR#: [id number]  HWT#:[id number]  Patient Care Team: [name] [place] as PCP - General (Family Medicine) Magrinat, Virgie Dad, MD as Consulting Physician (Oncology) Kyung Rudd, MD as Consulting Physician (Radiation Oncology) Armandina Gemma, MD as Consulting Physician (General Surgery) Rockwell Germany, RN as Registered Nurse Mauro Kaufmann, RN as Registered Nurse Scot Dock, NP OTHER MD:  CHIEF COMPLAINT: Triple positive locally advanced breast cancer  CURRENT TREATMENT: Neoadjuvant chemotherapy   INTERVAL HISTORY: Joellen Jersey returns today for follow up and treatment of her locally advanced breast cancer. She was evaluated in the breast cancer clinic on 07/31/2020.  She completed her chemotherapy and is here for her every three week Trastuzumab.  She is going to receive this every three weeks.   She underwent echo on 11/04/2020 that showed an EF of 65-70%.  Her surgery is scheduled on 01/07/2021.  She is undergoing double mastectomy without reconstruction.    She remains tired and says that she has some tight aching muscles, but otherwise feels well.  Her calcium was last checked 3 weeks ago and was normal.    REVIEW OF SYSTEMS: Review of Systems  Constitutional:  Positive for fatigue. Negative for appetite change, chills, fever and unexpected weight change.  HENT:   Negative for hearing loss, lump/mass and trouble swallowing.   Eyes:  Negative for eye problems and icterus.  Respiratory:  Negative for chest tightness, cough and shortness of breath.   Cardiovascular:  Negative for chest pain, leg swelling and palpitations.  Gastrointestinal:  Negative for abdominal distention, abdominal pain, constipation, diarrhea, nausea and vomiting.  Endocrine: Negative for hot flashes.  Genitourinary:  Negative for difficulty urinating.   Musculoskeletal:   Negative for arthralgias.  Skin:  Negative for itching and rash.  Neurological:  Negative for dizziness, extremity weakness, headaches and numbness.  Hematological:  Negative for adenopathy. Does not bruise/bleed easily.  Psychiatric/Behavioral:  Negative for depression. The patient is not nervous/anxious.      COVID 19 VACCINATION STATUS: Status post Moderna x2 followed by [employer] booster   HISTORY OF CURRENT ILLNESS: From the original intake note:  [name] (the daughter of my patient, Deyjah Kindel, whom I saw earlier today). has a history of medullary thyroid cancer, status post total thyroidectomy on 04/12/2019 under Dr. Harlow Asa.   More recently she presented to her GYN with a right supraclavicular lump, prompting neck and chest CT on 06/24/2020. Neck CT showed: no recurrent mass in thyroid bed, palpable abnormality corresponds to a cluster of 2 lymph nodes in right supraclavicular region; prominent lym ph nodes in neck bilaterally. Chest CT showed: enhancing 1.4 cm lesion in medial right breast with associated mildly enlarged right axillary lymph nodes; 1.2 cm lesion in left hepatic lobe with central hyperattenuation; enlargement of right infrahilar and right hilar nodal tissue is borderline suspicious; sclerotic lesion with spiculated margins at T7 level favored to represent a bone island.  She underwent bilateral diagnostic mammography with tomography and bilateral breast ultrasonography at [facility] on 06/27/2020 showing: breast density category D; Right Breast 1. 2.6 cm irregular mass in inner breast, with associated microcalcifications spanning up to 5.1 cm, corresponding to chest CT finding. 2. Adjacent 0.9 cm mass in inner breast, 2 cm away from dominant mass. 3. Two indeterminate masses in outer breast. 4. At least 3 enlarged lymph nodes in right axilla  Left Breast  1. Indeterminate 1.2 cm mass in lower-outer breast, possibly a fibroadenoma 2. No enlarged lymphadenopathy in  left axilla.  Accordingly on 06/30/2020 she proceeded to biopsy of the right breast area in question. The pathology from this procedure ([id number]) showed:  1. Dominant mass  - invasive ductal carcinoma, grade 1  - ductal carcinoma in situ  - Prognostic indicators significant for: estrogen receptor, 90% positive and progesterone receptor, 80% positive, both with moderate staining intensity.  HER2 equivocal by immunohistochemistry (2+), but positive by FISH, with a signals ratio of 2.2 and the number per cell greater than 5.. 2. Smaller mass  - invasive ductal carcinoma, grade 1  - ductal carcinoma in situ  - Prognostic indicators significant for: estrogen receptor, 100% positive with strong staining intensity and progesterone receptor, 50% positive with moderate staining intensity.  HER2 positive by immunohistochemistry (3+). 3. Right axillary lymph node  - invasive ductal carcinoma, grade 1 Prognostic indicators significant for: estrogen receptor, 100% positive and progesterone receptor, 90% positive, both with strong staining intensity. HER2 equivocal by immunohistochemistry (2+), {but (positive or negative) by fluorescent in situ hybridization   Biopsy of the left breast area was performed the same day. Pathology from the procedure ([id number]) was benign.  She underwent breast MRI on 07/06/2020 showing: breast composition C; enhancing mass with adjacent non-mass enhancement in lower-inner right breast, which together measure 5.4 cm, corresponding with two biopsy-proven areas of malignancy; no other suspicious enhancement identified in right breast; one 4 mm right internal mammary lymph node with unclear significance; numerous abnormal right axillary lymph nodes; no evidence of left breast malignancy.  Cancer Staging Malignant neoplasm of upper-inner quadrant of right breast in female, estrogen receptor positive ([place]) Staging form: Breast, AJCC 8th Edition - Clinical stage from 07/08/2020: Stage  IIIA (cT3, cN3, cM0, G1, ER+, PR+, HER2+) - Unsigned Stage prefix: Initial diagnosis Histologic grading system: 3 grade system  The patient's subsequent history is as detailed below.   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)    Hx of varicella    Kidney stone 2012   Medullary thyroid carcinoma ([place])    Monoallelic mutation of CHEK2 gene in female patient    Postpartum care following vaginal delivery (7/27) 10/02/2014, June 16 , 2020   Thyroid cancer [facility]) 2021    PAST SURGICAL HISTORY: Past Surgical History:  Procedure Laterality Date   IR IMAGING GUIDED PORT INSERTION  07/22/2020   NO PAST SURGERIES     THYROIDECTOMY N/A 04/12/2019   Procedure: TOTAL THYROIDECTOMY WITH LIMITED LYMPH NODE DISSECTION;  Surgeon: Armandina Gemma, MD;  Location: [facility];  Service: General;  Laterality: N/A;   UNILATERAL SALPINGECTOMY Right 07/09/2017   Procedure: RIGHT SALPINGECTOMY WITH REMOVAL OF ECTOPIC PREGNANCY;  Surgeon: Azucena Fallen, MD;  Location: [facility];  Service: Gynecology;  Laterality: Right;    FAMILY HISTORY: Family History  Problem Relation Age of Onset   Breast cancer Mother 13       2 types of cancer one side mastectomy with chemo   Endometriosis Mother    Endometriosis Maternal Grandmother    Breast  cancer Maternal Grandmother        post menopausal   Diabetes Maternal Grandmother    Cancer Maternal Grandfather        prostate and lymphoma   Alzheimer's disease Paternal Grandmother    Cancer Father 34       prostate cancer   Aneurysm Paternal Grandfather        d. 21s   Please see genetic counseling note dated 11/23/2018 for full family  information.   GYNECOLOGIC HISTORY:  No LMP recorded. (Menstrual status: IUD). Menarche: 35 years old Age at first live birth: 35 years old [place] P 2 Patient has a Mirena IUD in place HRT n/a  Hysterectomy? no BSO?  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  Her husband Barnabas Lister works for [employer].  Their children are Dylan 5 and Emma 2.  Barnabas Lister has a 81 year old daughter, the patient's stepdaughter Hildred Alamin, currently attending [employer].  The patient is a [employer]    ADVANCED DIRECTIVES: In the absence of any documents to the contrary the patient's husband is her healthcare power of attorney   HEALTH MAINTENANCE: Social History   Tobacco Use   Smoking status: Some Days    Packs/day: 0.50    Years: 5.00    Pack years: 2.50    Types: Cigarettes   Smokeless tobacco: Never   Tobacco comments:    Actively trying to quit.  Vaping Use   Vaping Use: Never used  Substance Use Topics   Alcohol use: Yes    Alcohol/week: 7.0 - 14.0 standard drinks    Types: 7 - 14 Glasses of wine per week    Comment: glass of wine each night    Drug use: Not Currently    Types: Marijuana    Comment: not during pregnancy, last use was 03/27/2019     Colonoscopy: n/a (age)  PAP:   Bone density: n/a (age)   No Known Allergies  Current Outpatient Medications  Medication Sig Dispense Refill   acetaminophen (TYLENOL) 325 MG tablet Take 2 tablets (650 mg total) by mouth every 4 (four) hours as needed (for pain scale < 4). 100 tablet 0   amoxicillin-clavulanate (AUGMENTIN) 875-125 MG tablet Take 1 tablet by mouth 2 (two) times daily. 10 tablet 0   calcitRIOL (ROCALTROL) 0.5 MCG capsule Take 1 capsule (0.5 mcg total) by mouth 2 (two) times a week. 30 capsule 1   levonorgestrel (MIRENA) 20 MCG/24HR IUD 1 each by Intrauterine route once.     levothyroxine (SYNTHROID) 175 MCG tablet Take 175 mcg by  mouth daily before breakfast.     liothyronine (CYTOMEL) 5 MCG tablet Take 5 mcg by mouth daily.     loperamide (IMODIUM) 2 MG capsule Take 2 mg by mouth as needed for diarrhea or loose stools.     loratadine (CLARITIN) 10 MG tablet Take 1 tablet (10 mg total) by mouth daily. 90 tablet 0   LORazepam (ATIVAN) 0.5 MG tablet Take 1 tablet (0.5 mg total) by mouth every 12 (twelve) hours as needed for anxiety. 30 tablet 0   sertraline (ZOLOFT) 50 MG tablet Take 50 mg by mouth at bedtime.     valACYclovir (VALTREX) 500 MG tablet Take 1 tablet (500 mg total) by mouth daily. 90 tablet 0   VYVANSE 60 MG capsule Take 60 mg by mouth every morning.     No current facility-administered medications for this visit.  OBJECTIVE:  Vitals:   11/27/20 1040  BP: 117/68  Pulse: (!) 115  Resp: 20  Temp: 98.2 F (36.8 C)  SpO2: 100%      Body mass index is 27.57 kg/m.   Wt Readings from Last 3 Encounters:  11/27/20 170 lb 12.8 oz (77.5 kg)  11/06/20 165 lb 11.2 oz (75.2 kg)  10/16/20 163 lb 9.6 oz (74.2 kg)  ECOG FS:1 - Symptomatic but completely ambulatory GENERAL: Patient is a well appearing female in no acute distress HEENT:  Sclerae anicteric.  Oropharynx clear and moist. No ulcerations or evidence of oropharyngeal candidiasis. Neck is supple.  NODES:  No cervical, supraclavicular, or axillary lymphadenopathy palpated.  BREAST EXAM: no sign of progression LUNGS:  Clear to auscultation bilaterally.  No wheezes or rhonchi. HEART:  Regular rate and rhythm. No murmur appreciated. ABDOMEN:  Soft, nontender.  Positive, normoactive bowel sounds. No organomegaly palpated. MSK:  No focal spinal tenderness to palpation. Full range of motion bilaterally in the upper extremities. EXTREMITIES:  No peripheral edema.   SKIN:  Clear with no obvious rashes or skin changes. No nail dyscrasia. NEURO:  Nonfocal. Well oriented.  Appropriate affect.    LAB RESULTS:  CMP     Component Value Date/Time   NA 139  11/06/2020 1044   NA 142 02/16/2017 1332   K 4.1 11/06/2020 1044   CL 104 11/06/2020 1044   CO2 20 (L) 11/06/2020 1044   GLUCOSE 130 (H) 11/06/2020 1044   BUN 10 11/06/2020 1044   BUN 9 02/16/2017 1332   CREATININE 0.76 11/06/2020 1044   CREATININE 0.55 09/25/2020 1039   CREATININE 0.82 12/04/2012 1732   CALCIUM 9.5 11/06/2020 1044   PROT 7.6 11/06/2020 1044   PROT 6.5 02/16/2017 1332   ALBUMIN 4.7 11/06/2020 1044   ALBUMIN 4.3 02/16/2017 1332   AST 20 11/06/2020 1044   AST 20 09/25/2020 1039   ALT 20 11/06/2020 1044   ALT 27 09/25/2020 1039   ALKPHOS 53 11/06/2020 1044   BILITOT 0.4 11/06/2020 1044   BILITOT 0.7 09/25/2020 1039   GFRNONAA >60 11/06/2020 1044   GFRNONAA >60 09/25/2020 1039   GFRAA >60 04/15/2019 2145    No results found for: TOTALPROTELP, ALBUMINELP, A1GS, A2GS, BETS, BETA2SER, GAMS, MSPIKE, SPEI  Lab Results  Component Value Date   WBC 10.7 (H) 11/06/2020   NEUTROABS 7.7 11/06/2020   HGB 10.7 (L) 11/06/2020   HCT 31.6 (L) 11/06/2020   MCV 106.8 (H) 11/06/2020   PLT 349 11/06/2020    No results found for: LABCA2  No components found for: NLZJQB341  No results for input(s): INR in the last 168 hours.  No results found for: LABCA2  No results found for: CAN199  No results found for: CAN125  No results found for: PFX902  Lab Results  Component Value Date   CA2729 15.9 07/30/2020    No components found for: HGQUANT  Lab Results  Component Value Date   CEA1 5.17 (H) 07/30/2020   /  CEA ([facility])  Date Value Ref Range Status  07/30/2020 5.17 (H) 0.00 - 5.00 ng/mL Final    Comment:    (NOTE) This test was performed using Architect's Chemiluminescent Microparticle Immunoassay. Values obtained from different assay methods cannot be used interchangeably. Please note that 5-10% of patients who smoke may see CEA levels up to 6.9 ng/mL. Performed at [facility], [place] [address], [place], [place] [address]       No results  found for: AFPTUMOR  No results found for: CHROMOGRNA  No results found for: KPAFRELGTCHN, LAMBDASER, KAPLAMBRATIO (kappa/lambda light chains)  No results found for: HGBA, HGBA2QUANT, HGBFQUANT, HGBSQUAN (Hemoglobinopathy evaluation)   No results found for: LDH  No results found for: IRON, TIBC, IRONPCTSAT (Iron and TIBC)  No results found for: FERRITIN  Urinalysis    Component Value Date/Time   COLORURINE RED (A) 10/18/2020 1107   APPEARANCEUR HAZY (A) 10/18/2020 1107   LABSPEC 1.009 10/18/2020 1107   PHURINE 8.0 10/18/2020 1107   GLUCOSEU NEGATIVE 10/18/2020 1107   HGBUR LARGE (A) 10/18/2020 1107   BILIRUBINUR NEGATIVE 10/18/2020 1107   BILIRUBINUR small (A) 02/16/2017 1216   BILIRUBINUR small 12/04/2012 [place] 10/18/2020 1107   PROTEINUR 30 (A) 10/18/2020 1107   UROBILINOGEN 0.2 02/16/2017 1216   UROBILINOGEN 0.2 09/18/2013 1555   NITRITE NEGATIVE 10/18/2020 1107   [place] 10/18/2020 1107    STUDIES: MR BREAST BILATERAL W [place] CAD  Result Date: 11/07/2020 CLINICAL DATA:  Breast cancer, assess treatment response. Status post neoadjuvant chemotherapy. 35 year old female with diagnosis of RIGHT breast cancer, originally identified on chest CT performed on 06/24/2020. Status post 2 ultrasound-guided biopsies of masses in the medial RIGHT breast, both demonstrating invasive ductal carcinoma. Also, a RIGHT axillary mass was biopsied showing invasive ductal carcinoma with no residual nodal tissue, compatible with additional primary focus or an entirely replaced metastatic lymph node. Status post benign LEFT breast biopsy. LABS:  Not performed at imaging site. EXAM: BILATERAL BREAST MRI WITH AND WITHOUT CONTRAST TECHNIQUE: Multiplanar, multisequence MR images of both breasts were obtained prior to and following the intravenous administration of 7 ml of Gadavist Three-dimensional MR images were rendered by post-processing  of the original MR data on an independent workstation. The three-dimensional MR images were interpreted, and findings are reported in the following complete MRI report for this study. Three dimensional images were evaluated at the independent interpreting workstation using the DynaCAD thin client. COMPARISON:  Breast MRI dated 07/06/2020. FINDINGS: Breast composition: c. Heterogeneous fibroglandular tissue. Background parenchymal enhancement: Mild Right breast: Persistent non-mass enhancement within the inner RIGHT breast, measuring 5.2 x 2.9 cm, not significantly changed in extent but significantly decreased in volume of enhancement and with decreased signal intensity indicating a good response to interval treatment (but not a complete imaging response), with 2 associated biopsy clips. Specifically, the dominant irregular mass within the inner RIGHT breast is significantly reduced in size, with only a thin component remaining. However, there is new focal non-mass enhancement within the lower outer quadrant of the RIGHT breast, measuring 9 mm, with persistent enhancement kinetics (series 11, image 109). Left breast: No suspicious enhancing mass, non-mass enhancement or secondary signs of malignancy within the LEFT breast. Biopsy clip artifact within the lower outer quadrant of the LEFT breast, corresponding to an earlier benign biopsy site (by outside report). Lymph nodes: There are now no enlarged lymph nodes identified in the RIGHT axilla, indicating a significant improvement. One of the lymph nodes in the lower RIGHT axilla contains a biopsy clip, corresponding to the earlier biopsy site revealing invasive carcinoma (presumed axillary lymph node metastasis with complete replacement of the lymph node), significantly decreased in size compared to the previous MRI. There are no enlarged lymph nodes identified within the LEFT axilla. The previously demonstrated 4 mm lymph node within the internal mammary chain region  also appears smaller on today's exam. No enlarged lymph nodes are seen within the internal mammary chain regions  bilaterally. Ancillary findings:  None. IMPRESSION: 1. New focal non-mass enhancement within the lower outer quadrant of the RIGHT breast, measuring 9 mm, with persistent enhancement kinetics (series 11, image 109). This is nonspecific but could represent new multicentric disease. If breast conservation surgery for the RIGHT breast is being considered, recommend MRI-guided biopsy. 2. Although there is persistent non-mass enhancement within the inner RIGHT breast, which includes 2 sites of biopsy-proven invasive carcinoma with associated biopsy clips, the overall volume and intensity of the non-mass enhancement is significantly reduced indicating a good response to interval treatment. 3. There are now no enlarged lymph nodes identified in the RIGHT axilla, also indicating a good response to interval treatment. 4. No MRI evidence of malignancy within the LEFT breast. RECOMMENDATION: 1. If breast conservation surgery is being considered for the RIGHT breast, MRI-guided biopsy is recommended for a new small (9 mm) focal non-mass enhancement within the outer RIGHT breast. Per previous reports, plan is for a RIGHT mastectomy. If so, no additional biopsy is needed. 2. Otherwise, per current treatment plan for patient's known biopsy-proven RIGHT breast invasive carcinoma. BI-RADS CATEGORY  4: Suspicious. Electronically Signed   By: Franki Cabot M.D.   On: 11/07/2020 13:27  ECHOCARDIOGRAM COMPLETE  Result Date: 11/04/2020    ECHOCARDIOGRAM REPORT   Patient Name:   Kayla Little Date of Exam: 11/04/2020 Medical Rec #:  [id number]    Height:       66.0 in Accession #:    [id number]   Weight:       163.6 lb Date of Birth:  [birth year]    BSA:          1.836 m Patient Age:    35 years     BP:           119/79 mmHg Patient Gender: F            HR:           99 bpm. Exam Location:  Outpatient Procedure: 2D Echo,  Cardiac Doppler, Color Doppler and Strain Analysis Indications:    Chemo Z09  History:        Patient has prior history of Echocardiogram examinations, most                 recent 07/23/2020.  Sonographer:    Bernadene Person RDCS Referring Phys: [place]  1. Global longitudinal strain is -21.7%. Left ventricular ejection fraction, by estimation, is 65 to 70%. The left ventricle has normal function. The left ventricle has no regional wall motion abnormalities. Left ventricular diastolic parameters were normal.  2. Right ventricular systolic function is normal. The right ventricular size is normal.  3. The mitral valve is normal in structure. Trivial mitral valve regurgitation.  4. The aortic valve is normal in structure. Aortic valve regurgitation is not visualized.  5. The inferior vena cava is normal in size with greater than 50% respiratory variability, suggesting right atrial pressure of 3 mmHg. Comparison(s): The left ventricular function is unchanged. FINDINGS  Left Ventricle: Global longitudinal strain is -21.7%. Left ventricular ejection fraction, by estimation, is 65 to 70%. The left ventricle has normal function. The left ventricle has no regional wall motion abnormalities. The left ventricular internal cavity size was normal in size. There is no left ventricular hypertrophy. Left ventricular diastolic parameters were normal. Right Ventricle: The right ventricular size is normal. Right vetricular wall thickness was not assessed. Right ventricular systolic function is normal. Left Atrium: Left atrial  size was normal in size. Right Atrium: Right atrial size was normal in size. Pericardium: There is no evidence of pericardial effusion. Mitral Valve: The mitral valve is normal in structure. Trivial mitral valve regurgitation. Tricuspid Valve: The tricuspid valve is normal in structure. Tricuspid valve regurgitation is trivial. Aortic Valve: The aortic valve is normal in structure.  Aortic valve regurgitation is not visualized. Pulmonic Valve: The pulmonic valve was not well visualized. Pulmonic valve regurgitation is not visualized. No evidence of pulmonic stenosis. Aorta: The aortic root and ascending aorta are structurally normal, with no evidence of dilitation. Venous: The inferior vena cava is normal in size with greater than 50% respiratory variability, suggesting right atrial pressure of 3 mmHg. IAS/Shunts: No atrial level shunt detected by color flow Doppler.  LEFT VENTRICLE PLAX 2D LVIDd:         4.90 cm  Diastology LVIDs:         3.10 cm  LV e' medial:    9.73 cm/s LV PW:         0.90 cm  LV E/e' medial:  7.2 LV IVS:        0.70 cm  LV e' lateral:   10.70 cm/s LVOT diam:     1.90 cm  LV E/e' lateral: 6.5 LV SV:         48 LV SV Index:   26 LVOT Area:     2.84 cm  RIGHT VENTRICLE RV S prime:     13.10 cm/s TAPSE (M-mode): 1.8 cm LEFT ATRIUM             Index       RIGHT ATRIUM           Index LA diam:        2.70 cm 1.47 cm/m  RA Area:     10.70 cm LA Vol (A2C):   32.3 ml 17.59 ml/m RA Volume:   22.40 ml  12.20 ml/m LA Vol (A4C):   36.4 ml 19.82 ml/m LA Biplane Vol: 35.6 ml 19.39 ml/m  AORTIC VALVE LVOT Vmax:   104.00 cm/s LVOT Vmean:  69.400 cm/s LVOT VTI:    0.171 m  AORTA Ao Root diam: 3.20 cm Ao Asc diam:  2.80 cm MITRAL VALVE MV Area (PHT): 5.75 cm    SHUNTS MV Decel Time: 132 msec    Systemic VTI:  0.17 m MV E velocity: 69.80 cm/s  Systemic Diam: 1.90 cm MV A velocity: 66.40 cm/s MV E/A ratio:  1.05 Dorris Carnes MD Electronically signed by Dorris Carnes MD Signature Date/Time: 11/04/2020/10:06:15 PM    Final       ELIGIBLE FOR AVAILABLE RESEARCH PROTOCOL:   ASSESSMENT: 35 y.o. [place] woman with a clinical mT2 N3, stage IIIA invasive ductal carcinoma, grade 1, triple positive  (a) breast MRI 07/06/2020 finds a 1.6 cm lower inner quadrant mass associated with 5.4 cm of non-mass-like enhancement, at least 8 enlarged right axillary lymph node and 1 right internal mammary  lymph node  (b) CT scans of the neck and chest 06/24/2020 shows subcentimeter right cervical adenopathy, 2 right supraclavicular lymph nodes the larger 0.65 cm, also left cervical adenopathy largest measuring 1.02 cm; there is also a 1.2 cm lesion in the left hepatic lobe, and a sclerotic lesion in the mid thoracic spine at T7  (c) PET scan 07/21/2020 shows regional adenopathy (including 2 right subcentimeter supraclavicular lymph nodes and subcentimeter lymph nodes in the right hilar, subcarinal and lateral aortic region), no uptake in  liver, and an indeterminate T12 lesion  (d) thoracic spine MRI 07/27/2020 shows the T12 lesion to be likely benign, there is also degenerative disc disease  (1) status post total thyroidectomy and central compartment lymph node biopsy 04/12/2019 for a pT1a pN0 medullary thyroid carcinoma, with negative margins, no angioinvasion, and no extrathyroidal extension  (a) CEA and calcitonin   (2) genetics testing 10/27/2018 showed   (a) 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 21 days x 6, starting 07/23/2020  (4) anti-HER2 immunotherapy to continue to total 1 year  (a) echo 07/22/2020 shows an ejection fraction in the 60-65% range.  (B) echo on 11/04/2020 shows an EF of 65-70%  (5) Bilateral mastectomies on 01/09/2021  (6) adjuvant radiation  (7) antiestrogens   PLAN:  Joellen Jersey is doing well today.  She will proceed with Trastuzumab today.  We are going to draw labs once she gets back to treatment since she was running behind, and the Trastuzumab doesn't require results prior to administration.    [name] and I reviewed  her great MRI results and her upcoming surgery.  She is going out of town in late October, and will undergo surgery in early November.  We will get her echo just prior to her surgery since she is undergoing bilateral mastectomies.    We will conitnue to see her every 3 weeks for Herceptin.  Her f/u with Dr. Jana Hakim is scheduled for 01/15/2021.     Total encounter time: 20 minutes* in face to face visit time, chart review, lab review, coordination of care, and documentation of the encounter.   Wilber Bihari, NP 11/27/20 11:08 AM Medical Oncology and Hematology [facility] [place], [place] [address] Tel. [phone]    Fax. [phone]    *Total Encounter Time as defined by the Centers for Medicare and Medicaid Services includes, in addition to the face-to-face time of a patient visit (documented in the note above) non-face-to-face time: obtaining and reviewing outside history, ordering and reviewing medications, tests or procedures, care coordination (communications with other health care professionals or caregivers) and documentation in the medical record.

## 2020-11-27 NOTE — Progress Notes (Signed)
OK to infuse Trastuzumab over 30 minutes today.  Kennith Center, Pharm.D., CPP 11/27/2020@1 :21 PM

## 2020-11-27 NOTE — Progress Notes (Signed)
Per Lindsey Causey NP, ok to treat with elevated HR. 

## 2020-11-27 NOTE — Patient Instructions (Signed)
[facility]ICAL [facility]  Discharge Instructions: Thank you for choosing [facility] to provide your oncology and hematology care.   If you have a lab appointment with the Cancer Center, please go directly to the Cancer Center and check in at the registration area.   Wear comfortable clothing and clothing appropriate for easy access to any Portacath or PICC line.   We strive to give you quality time with your provider. You may need to reschedule your appointment if you arrive late (15 or more minutes).  Arriving late affects you and other patients whose appointments are after yours.  Also, if you miss three or more appointments without notifying the office, you may be dismissed from the clinic at the provider's discretion.      For prescription refill requests, have your pharmacy contact our office and allow 72 hours for refills to be completed.    Today you received the following chemotherapy and/or immunotherapy agents trastuzumab      To help prevent nausea and vomiting after your treatment, we encourage you to take your nausea medication as directed.  BELOW ARE SYMPTOMS THAT SHOULD BE REPORTED IMMEDIATELY: *FEVER GREATER THAN 100.4 F (38 C) OR HIGHER *CHILLS OR SWEATING *NAUSEA AND VOMITING THAT IS NOT CONTROLLED WITH YOUR NAUSEA MEDICATION *UNUSUAL SHORTNESS OF BREATH *UNUSUAL BRUISING OR BLEEDING *URINARY PROBLEMS (pain or burning when urinating, or frequent urination) *BOWEL PROBLEMS (unusual diarrhea, constipation, pain near the anus) TENDERNESS IN MOUTH AND THROAT WITH OR WITHOUT PRESENCE OF ULCERS (sore throat, sores in mouth, or a toothache) UNUSUAL RASH, SWELLING OR PAIN  UNUSUAL VAGINAL DISCHARGE OR ITCHING   Items with * indicate a potential emergency and should be followed up as soon as possible or go to the Emergency Department if any problems should occur.  Please show the CHEMOTHERAPY ALERT CARD or IMMUNOTHERAPY ALERT CARD at check-in to  the Emergency Department and triage nurse.  Should you have questions after your visit or need to cancel or reschedule your appointment, please contact [facility]ICAL [facility]  Dept: [phone]  and follow the prompts.  Office hours are 8:00 a.m. to 4:30 p.m. Monday - Friday. Please note that voicemails left after 4:00 p.m. may not be returned until the following business day.  We are closed weekends and major holidays. You have access to a nurse at all times for urgent questions. Please call the main number to the clinic Dept: [phone] and follow the prompts.   For any non-urgent questions, you may also contact your provider using MyChart. We now offer e-Visits for anyone 18 and older to request care online for non-urgent symptoms. For details visit [email or web address].   Also download the MyChart app! Go to the app store, search "MyChart", open the app, select [facility], and log in with your MyChart username and password.  Due to Covid, a mask is required upon entering the hospital/clinic. If you do not have a mask, one will be given to you upon arrival. For doctor visits, patients may have 1 support person aged 18 or older with them. For treatment visits, patients cannot have anyone with them due to current Covid guidelines and our immunocompromised population.   

## 2020-12-11 NOTE — Progress Notes (Signed)
The following biosimilar Kanjinti (trastuzumab-anns) has been selected for use in this patient.  Kennith Center, Pharm.D., CPP 12/11/2020@1 :38 PM

## 2020-12-18 ENCOUNTER — Inpatient Hospital Stay: Payer: 59 | Attending: Oncology

## 2020-12-18 ENCOUNTER — Other Ambulatory Visit: Payer: Self-pay

## 2020-12-18 ENCOUNTER — Inpatient Hospital Stay: Payer: 59

## 2020-12-18 ENCOUNTER — Encounter: Payer: Self-pay | Admitting: *Deleted

## 2020-12-18 VITALS — BP 132/86 | HR 97 | Temp 98.6°F | Resp 18

## 2020-12-18 DIAGNOSIS — C50211 Malignant neoplasm of upper-inner quadrant of right female breast: Secondary | ICD-10-CM

## 2020-12-18 DIAGNOSIS — E3122 Multiple endocrine neoplasia [MEN] type IIA: Secondary | ICD-10-CM

## 2020-12-18 DIAGNOSIS — Z5112 Encounter for antineoplastic immunotherapy: Secondary | ICD-10-CM | POA: Diagnosis not present

## 2020-12-18 DIAGNOSIS — Z170 Estrogen receptor positive status [ER+]: Secondary | ICD-10-CM | POA: Diagnosis not present

## 2020-12-18 DIAGNOSIS — Z95828 Presence of other vascular implants and grafts: Secondary | ICD-10-CM

## 2020-12-18 DIAGNOSIS — Z23 Encounter for immunization: Secondary | ICD-10-CM | POA: Diagnosis not present

## 2020-12-18 LAB — CBC WITH DIFFERENTIAL/PLATELET
Abs Immature Granulocytes: 0.02 10*3/uL (ref 0.00–0.07)
Basophils Absolute: 0.1 10*3/uL (ref 0.0–0.1)
Basophils Relative: 1 %
Eosinophils Absolute: 0.1 10*3/uL (ref 0.0–0.5)
Eosinophils Relative: 2 %
HCT: 37 % (ref 36.0–46.0)
Hemoglobin: 12.2 g/dL (ref 12.0–15.0)
Immature Granulocytes: 0 %
Lymphocytes Relative: 33 %
Lymphs Abs: 2.3 10*3/uL (ref 0.7–4.0)
MCH: 36.1 pg — ABNORMAL HIGH (ref 26.0–34.0)
MCHC: 33 g/dL (ref 30.0–36.0)
MCV: 109.5 fL — ABNORMAL HIGH (ref 80.0–100.0)
Monocytes Absolute: 0.7 10*3/uL (ref 0.1–1.0)
Monocytes Relative: 10 %
Neutro Abs: 3.7 10*3/uL (ref 1.7–7.7)
Neutrophils Relative %: 54 %
Platelets: 267 10*3/uL (ref 150–400)
RBC: 3.38 MIL/uL — ABNORMAL LOW (ref 3.87–5.11)
RDW: 12.5 % (ref 11.5–15.5)
WBC: 6.8 10*3/uL (ref 4.0–10.5)
nRBC: 0 % (ref 0.0–0.2)

## 2020-12-18 LAB — CMP (CANCER CENTER ONLY)
ALT: 23 U/L (ref 0–44)
AST: 23 U/L (ref 15–41)
Albumin: 4.3 g/dL (ref 3.5–5.0)
Alkaline Phosphatase: 36 U/L — ABNORMAL LOW (ref 38–126)
Anion gap: 8 (ref 5–15)
BUN: 12 mg/dL (ref 6–20)
CO2: 24 mmol/L (ref 22–32)
Calcium: 8.4 mg/dL — ABNORMAL LOW (ref 8.9–10.3)
Chloride: 104 mmol/L (ref 98–111)
Creatinine: 0.73 mg/dL (ref 0.44–1.00)
GFR, Estimated: >60 mL/min (ref >60)
Glucose, Bld: 107 mg/dL — ABNORMAL HIGH (ref 70–99)
Potassium: 4.1 mmol/L (ref 3.5–5.1)
Sodium: 136 mmol/L (ref 135–145)
Total Bilirubin: 0.7 mg/dL (ref 0.3–1.2)
Total Protein: 6.9 g/dL (ref 6.5–8.1)

## 2020-12-18 LAB — PREGNANCY, URINE: Preg Test, Ur: NEGATIVE

## 2020-12-18 MED ORDER — INFLUENZA VAC SPLIT QUAD 0.5 ML IM SUSY
0.5000 mL | PREFILLED_SYRINGE | Freq: Once | INTRAMUSCULAR | Status: AC
  Administered 2020-12-18: 0.5 mL via INTRAMUSCULAR
  Filled 2020-12-18: qty 0.5

## 2020-12-18 MED ORDER — SODIUM CHLORIDE 0.9% FLUSH: 10.0000 mL | INTRAVENOUS | Status: AC | PRN

## 2020-12-18 MED ORDER — SODIUM CHLORIDE 0.9% FLUSH
10.0000 mL | INTRAVENOUS | Status: DC | PRN
  Administered 2020-12-18: 10 mL

## 2020-12-18 MED ORDER — TRASTUZUMAB-ANNS CHEMO 150 MG IV SOLR
6.0000 mg/kg | Freq: Once | INTRAVENOUS | Status: AC
  Administered 2020-12-18: 462 mg via INTRAVENOUS
  Filled 2020-12-18: qty 22

## 2020-12-18 MED ORDER — DIPHENHYDRAMINE HCL 25 MG PO CAPS
25.0000 mg | ORAL_CAPSULE | Freq: Once | ORAL | Status: AC
  Administered 2020-12-18: 25 mg via ORAL
  Filled 2020-12-18: qty 1

## 2020-12-18 MED ORDER — ACETAMINOPHEN 325 MG PO TABS
650.0000 mg | ORAL_TABLET | Freq: Once | ORAL | Status: AC
  Administered 2020-12-18: 650 mg via ORAL
  Filled 2020-12-18: qty 2

## 2020-12-18 MED ORDER — SODIUM CHLORIDE 0.9 % IV SOLN: Freq: Once | INTRAVENOUS | Status: AC

## 2020-12-18 MED ORDER — HEPARIN SOD (PORK) LOCK FLUSH 100 UNIT/ML IV SOLN
500.0000 [IU] | Freq: Once | INTRAVENOUS | Status: AC | PRN
  Administered 2020-12-18: 500 [IU]

## 2020-12-18 NOTE — Patient Instructions (Signed)
[name]   ?Discharge Instructions: ?Thank you for choosing [facility] to provide your oncology and hematology care.  ? ?If you have a lab appointment with the [facility], please go directly to the [facility] and check in at the registration area. ?  ?Wear comfortable clothing and clothing appropriate for easy access to any Portacath or PICC line.  ? ?We strive to give you quality time with your provider. You may need to reschedule your appointment if you arrive late (15 or more minutes).  Arriving late affects you and other patients whose appointments are after yours.  Also, if you miss three or more appointments without notifying the office, you may be dismissed from the clinic at the provider?s discretion.    ?  ?For prescription refill requests, have your pharmacy contact our office and allow 72 hours for refills to be completed.   ? ?Today you received the following chemotherapy and/or immunotherapy agents: trastuzumab-anns    ?  ?To help prevent nausea and vomiting after your treatment, we encourage you to take your nausea medication as directed. ? ?BELOW ARE SYMPTOMS THAT SHOULD BE REPORTED IMMEDIATELY: ?*FEVER GREATER THAN 100.4 F (38 ?C) OR HIGHER ?*CHILLS OR SWEATING ?*NAUSEA AND VOMITING THAT IS NOT CONTROLLED WITH YOUR NAUSEA MEDICATION ?*UNUSUAL SHORTNESS OF BREATH ?*UNUSUAL BRUISING OR BLEEDING ?*URINARY PROBLEMS (pain or burning when urinating, or frequent urination) ?*BOWEL PROBLEMS (unusual diarrhea, constipation, pain near the anus) ?TENDERNESS IN MOUTH AND THROAT WITH OR WITHOUT PRESENCE OF ULCERS (sore throat, sores in mouth, or a toothache) ?UNUSUAL RASH, SWELLING OR PAIN  ?UNUSUAL VAGINAL DISCHARGE OR ITCHING  ? ?Items with * indicate a potential emergency and should be followed up as soon as possible or go to the Emergency Department if any problems should occur. ? ?Please show the CHEMOTHERAPY ALERT CARD or IMMUNOTHERAPY ALERT CARD at  check-in to the Emergency Department and triage nurse. ? ?Should you have questions after your visit or need to cancel or reschedule your appointment, please contact [name]  Dept: [phone]  and follow the prompts.  Office hours are 8:00 a.m. to 4:30 p.m. Monday - Friday. Please note that voicemails left after 4:00 p.m. may not be returned until the following business day.  We are closed weekends and major holidays. You have access to a nurse at all times for urgent questions. Please call the main number to the clinic Dept: [phone] and follow the prompts. ? ? ?For any non-urgent questions, you may also contact your provider using MyChart. We now offer e-Visits for anyone 18 and older to request care online for non-urgent symptoms. For details visit [email or web address]. ?  ?Also download the MyChart app! Go to the app store, search "MyChart", open the app, select [facility], and log in with your MyChart username and password. ? ?Due to Covid, a mask is required upon entering the hospital/clinic. If you do not have a mask, one will be given to you upon arrival. For doctor visits, patients may have 1 support person aged 18 or older with them. For treatment visits, patients cannot have anyone with them due to current Covid guidelines and our immunocompromised population.  ? ?

## 2020-12-19 LAB — CALCIUM, IONIZED: Calcium, Ionized, Serum: 4.8 mg/dL (ref 4.5–5.6)

## 2020-12-26 ENCOUNTER — Other Ambulatory Visit (HOSPITAL_COMMUNITY): Payer: 59

## 2020-12-29 ENCOUNTER — Other Ambulatory Visit: Payer: Self-pay

## 2020-12-29 ENCOUNTER — Encounter (HOSPITAL_BASED_OUTPATIENT_CLINIC_OR_DEPARTMENT_OTHER): Payer: Self-pay | Admitting: General Surgery

## 2020-12-29 ENCOUNTER — Ambulatory Visit (HOSPITAL_COMMUNITY)
Admission: RE | Admit: 2020-12-29 | Discharge: 2020-12-29 | Disposition: A | Discharge status: Home / Self Care | Payer: 59 | Source: Ambulatory Visit | Attending: Adult Health | Admitting: Adult Health

## 2020-12-29 DIAGNOSIS — Z0189 Encounter for other specified special examinations: Secondary | ICD-10-CM | POA: Diagnosis not present

## 2020-12-29 DIAGNOSIS — Z01810 Encounter for preprocedural cardiovascular examination: Secondary | ICD-10-CM | POA: Insufficient documentation

## 2020-12-29 DIAGNOSIS — C50211 Malignant neoplasm of upper-inner quadrant of right female breast: Secondary | ICD-10-CM | POA: Insufficient documentation

## 2020-12-29 DIAGNOSIS — Z170 Estrogen receptor positive status [ER+]: Secondary | ICD-10-CM | POA: Insufficient documentation

## 2020-12-29 LAB — ECHOCARDIOGRAM COMPLETE
Area-P 1/2: 4.02 cm2
S' Lateral: 3.4 cm

## 2020-12-29 NOTE — Progress Notes (Signed)
  Echocardiogram 2D Echocardiogram has been performed.  Darlina Sicilian M 12/29/2020, 10:37 AM

## 2020-12-30 ENCOUNTER — Encounter: Payer: Self-pay | Admitting: Oncology

## 2020-12-30 NOTE — Telephone Encounter (Signed)
No entry 

## 2020-12-31 MED ORDER — CHLORHEXIDINE GLUCONATE CLOTH 2 % EX PADS: 6.0000 | MEDICATED_PAD | Freq: Once | CUTANEOUS | Status: DC

## 2020-12-31 NOTE — Progress Notes (Signed)

## 2021-01-06 NOTE — Anesthesia Preprocedure Evaluation (Addendum)
Anesthesia Evaluation  Patient identified by MRN, date of birth, ID band Patient awake    Reviewed: Allergy & Precautions, NPO status , Patient's Chart, lab work & pertinent test results  Airway Mallampati: I       Dental no notable dental hx.    Pulmonary Current Smoker and Patient abstained from smoking.,    Pulmonary exam normal        Cardiovascular negative cardio ROS Normal cardiovascular exam     Neuro/Psych PSYCHIATRIC DISORDERS Depression    GI/Hepatic negative GI ROS, Neg liver ROS,   Endo/Other  Hypothyroidism MEN 2A  Renal/GU   negative genitourinary   Musculoskeletal negative musculoskeletal ROS (+)   Abdominal Normal abdominal exam  (+)   Peds  Hematology   Anesthesia Other Findings  1. Left ventricular ejection fraction, by estimation, is 60 to 65%. The  left ventricle has normal function. The left ventricle has no regional  wall motion abnormalities. Left ventricular diastolic parameters were  normal. The average left ventricular  global longitudinal strain is -21.3 %.  2. Right ventricular systolic function is normal. The right ventricular  size is normal.  3. The mitral valve is normal in structure. No evidence of mitral valve  regurgitation. No evidence of mitral stenosis.  4. The aortic valve is normal in structure. Aortic valve regurgitation is  not visualized. No aortic stenosis is present.  5. The inferior vena cava is normal in size with greater than 50%  respiratory variability, suggesting right atrial pressure of 3 mmHg.   FINDINGS   Reproductive/Obstetrics negative OB ROS                          Anesthesia Physical Anesthesia Plan  ASA: 2  Anesthesia Plan: General   Post-op Pain Management:  Regional for Post-op pain   Induction: Intravenous  PONV Risk Score and Plan: 2 and Ondansetron, Midazolam and Treatment may vary due to age or medical  condition  Airway Management Planned: Oral ETT  Additional Equipment: None  Intra-op Plan:   Post-operative Plan: Extubation in OR  Informed Consent: I have reviewed the patients History and Physical, chart, labs and discussed the procedure including the risks, benefits and alternatives for the proposed anesthesia with the patient or authorized representative who has indicated his/her understanding and acceptance.     Dental advisory given  Plan Discussed with: CRNA  Anesthesia Plan Comments:        Anesthesia Quick Evaluation

## 2021-01-07 ENCOUNTER — Ambulatory Visit (HOSPITAL_BASED_OUTPATIENT_CLINIC_OR_DEPARTMENT_OTHER)
Admission: RE | Admit: 2021-01-07 | Discharge: 2021-01-08 | Disposition: A | Discharge status: Home / Self Care | Payer: 59 | Source: Ambulatory Visit | Attending: General Surgery | Admitting: General Surgery

## 2021-01-07 ENCOUNTER — Other Ambulatory Visit: Payer: Self-pay

## 2021-01-07 ENCOUNTER — Ambulatory Visit (HOSPITAL_BASED_OUTPATIENT_CLINIC_OR_DEPARTMENT_OTHER): Payer: 59 | Admitting: Anesthesiology

## 2021-01-07 ENCOUNTER — Encounter (HOSPITAL_BASED_OUTPATIENT_CLINIC_OR_DEPARTMENT_OTHER)
Admission: RE | Disposition: A | Discharge status: Home / Self Care | Payer: Self-pay | Source: Ambulatory Visit | Attending: General Surgery

## 2021-01-07 ENCOUNTER — Encounter (HOSPITAL_BASED_OUTPATIENT_CLINIC_OR_DEPARTMENT_OTHER): Payer: Self-pay | Admitting: General Surgery

## 2021-01-07 DIAGNOSIS — Z803 Family history of malignant neoplasm of breast: Secondary | ICD-10-CM | POA: Insufficient documentation

## 2021-01-07 DIAGNOSIS — Z170 Estrogen receptor positive status [ER+]: Secondary | ICD-10-CM | POA: Diagnosis not present

## 2021-01-07 DIAGNOSIS — F17200 Nicotine dependence, unspecified, uncomplicated: Secondary | ICD-10-CM | POA: Insufficient documentation

## 2021-01-07 DIAGNOSIS — N6012 Diffuse cystic mastopathy of left breast: Secondary | ICD-10-CM | POA: Diagnosis not present

## 2021-01-07 DIAGNOSIS — C50211 Malignant neoplasm of upper-inner quadrant of right female breast: Secondary | ICD-10-CM | POA: Insufficient documentation

## 2021-01-07 DIAGNOSIS — Z9221 Personal history of antineoplastic chemotherapy: Secondary | ICD-10-CM | POA: Insufficient documentation

## 2021-01-07 DIAGNOSIS — C50911 Malignant neoplasm of unspecified site of right female breast: Secondary | ICD-10-CM | POA: Diagnosis present

## 2021-01-07 HISTORY — PX: SIMPLE MASTECTOMY WITH AXILLARY SENTINEL NODE BIOPSY: SHX6098

## 2021-01-07 HISTORY — PX: MODIFIED MASTECTOMY: SHX5268

## 2021-01-07 LAB — POCT PREGNANCY, URINE: Preg Test, Ur: NEGATIVE

## 2021-01-07 SURGERY — MODIFIED MASTECTOMY
Anesthesia: General | Site: Breast | Laterality: Right

## 2021-01-07 MED ORDER — CELECOXIB 200 MG PO CAPS
200.0000 mg | ORAL_CAPSULE | ORAL | Status: AC
  Administered 2021-01-07: 200 mg via ORAL

## 2021-01-07 MED ORDER — HEPARIN SODIUM (PORCINE) 5000 UNIT/ML IJ SOLN: 5000.0000 [IU] | Freq: Three times a day (TID) | INTRAMUSCULAR | Status: DC

## 2021-01-07 MED ORDER — 0.9 % SODIUM CHLORIDE (POUR BTL) OPTIME
TOPICAL | Status: DC | PRN
  Administered 2021-01-07: 1000 mL

## 2021-01-07 MED ORDER — ACETAMINOPHEN 500 MG PO TABS
ORAL_TABLET | ORAL | Status: AC
  Filled 2021-01-07: qty 2

## 2021-01-07 MED ORDER — DEXAMETHASONE SODIUM PHOSPHATE 10 MG/ML IJ SOLN
INTRAMUSCULAR | Status: AC
  Filled 2021-01-07: qty 1

## 2021-01-07 MED ORDER — ROCURONIUM 10MG/ML (10ML) SYRINGE FOR MEDFUSION PUMP - OPTIME
INTRAVENOUS | Status: DC | PRN
  Administered 2021-01-07: 20 mg via INTRAVENOUS
  Administered 2021-01-07: 10 mg via INTRAVENOUS
  Administered 2021-01-07: 70 mg via INTRAVENOUS
  Administered 2021-01-07: 10 mg via INTRAVENOUS

## 2021-01-07 MED ORDER — GABAPENTIN 300 MG PO CAPS
300.0000 mg | ORAL_CAPSULE | ORAL | Status: AC
  Administered 2021-01-07: 300 mg via ORAL

## 2021-01-07 MED ORDER — ROCURONIUM BROMIDE 10 MG/ML (PF) SYRINGE
PREFILLED_SYRINGE | INTRAVENOUS | Status: AC
  Filled 2021-01-07: qty 10

## 2021-01-07 MED ORDER — PROPOFOL 500 MG/50ML IV EMUL
INTRAVENOUS | Status: AC
  Filled 2021-01-07: qty 50

## 2021-01-07 MED ORDER — DEXAMETHASONE SODIUM PHOSPHATE 10 MG/ML IJ SOLN
INTRAMUSCULAR | Status: DC | PRN
  Administered 2021-01-07: 5 mg via INTRAVENOUS

## 2021-01-07 MED ORDER — LEVOTHYROXINE SODIUM 175 MCG PO TABS
175.0000 ug | ORAL_TABLET | Freq: Every day | ORAL | Status: DC
  Filled 2021-01-07: qty 1

## 2021-01-07 MED ORDER — DEXAMETHASONE SODIUM PHOSPHATE 10 MG/ML IJ SOLN
INTRAMUSCULAR | Status: DC | PRN
  Administered 2021-01-07 (×2): 10 mg

## 2021-01-07 MED ORDER — LIOTHYRONINE SODIUM 5 MCG PO TABS
5.0000 ug | ORAL_TABLET | Freq: Every day | ORAL | Status: DC
  Filled 2021-01-07: qty 1

## 2021-01-07 MED ORDER — GABAPENTIN 300 MG PO CAPS
ORAL_CAPSULE | ORAL | Status: AC
  Filled 2021-01-07: qty 1

## 2021-01-07 MED ORDER — PANTOPRAZOLE SODIUM 40 MG IV SOLR: 40.0000 mg | Freq: Every day | INTRAVENOUS | Status: DC

## 2021-01-07 MED ORDER — CEFAZOLIN SODIUM-DEXTROSE 2-4 GM/100ML-% IV SOLN
INTRAVENOUS | Status: AC
  Filled 2021-01-07: qty 100

## 2021-01-07 MED ORDER — FAMOTIDINE IN NACL 20-0.9 MG/50ML-% IV SOLN
INTRAVENOUS | Status: AC
  Filled 2021-01-07: qty 50

## 2021-01-07 MED ORDER — PHENYLEPHRINE HCL (PRESSORS) 10 MG/ML IV SOLN
INTRAVENOUS | Status: DC | PRN
  Administered 2021-01-07 (×2): 80 ug via INTRAVENOUS
  Administered 2021-01-07: 120 ug via INTRAVENOUS

## 2021-01-07 MED ORDER — HYDROMORPHONE HCL 1 MG/ML IJ SOLN
INTRAMUSCULAR | Status: AC
  Filled 2021-01-07: qty 0.5

## 2021-01-07 MED ORDER — DEXAMETHASONE SODIUM PHOSPHATE 10 MG/ML IJ SOLN: 8.0000 mg | Freq: Once | INTRAMUSCULAR | Status: DC | PRN

## 2021-01-07 MED ORDER — KETOROLAC TROMETHAMINE 30 MG/ML IJ SOLN: 30.0000 mg | Freq: Once | INTRAMUSCULAR | Status: DC | PRN

## 2021-01-07 MED ORDER — FENTANYL CITRATE (PF) 100 MCG/2ML IJ SOLN
INTRAMUSCULAR | Status: AC
  Filled 2021-01-07: qty 2

## 2021-01-07 MED ORDER — SUGAMMADEX SODIUM 200 MG/2ML IV SOLN
INTRAVENOUS | Status: DC | PRN
  Administered 2021-01-07: 150 mg via INTRAVENOUS

## 2021-01-07 MED ORDER — MIDAZOLAM HCL 2 MG/2ML IJ SOLN
INTRAMUSCULAR | Status: AC
  Filled 2021-01-07: qty 2

## 2021-01-07 MED ORDER — PROPOFOL 10 MG/ML IV BOLUS
INTRAVENOUS | Status: AC
  Filled 2021-01-07: qty 20

## 2021-01-07 MED ORDER — LACTATED RINGERS IV SOLN: INTRAVENOUS | Status: DC

## 2021-01-07 MED ORDER — CELECOXIB 200 MG PO CAPS
ORAL_CAPSULE | ORAL | Status: AC
  Filled 2021-01-07: qty 1

## 2021-01-07 MED ORDER — PROPOFOL 10 MG/ML IV BOLUS
INTRAVENOUS | Status: DC | PRN
  Administered 2021-01-07: 160 mg via INTRAVENOUS

## 2021-01-07 MED ORDER — ACETAMINOPHEN 500 MG PO TABS
1000.0000 mg | ORAL_TABLET | ORAL | Status: AC
  Administered 2021-01-07: 1000 mg via ORAL

## 2021-01-07 MED ORDER — FAMOTIDINE IN NACL 20-0.9 MG/50ML-% IV SOLN
20.0000 mg | Freq: Two times a day (BID) | INTRAVENOUS | Status: DC
  Administered 2021-01-07: 20 mg via INTRAVENOUS

## 2021-01-07 MED ORDER — CLONIDINE HCL (ANALGESIA) 100 MCG/ML EP SOLN
EPIDURAL | Status: DC | PRN
  Administered 2021-01-07 (×2): 100 ug

## 2021-01-07 MED ORDER — CEFAZOLIN SODIUM-DEXTROSE 2-4 GM/100ML-% IV SOLN
2.0000 g | INTRAVENOUS | Status: AC
  Administered 2021-01-07: 2 g via INTRAVENOUS

## 2021-01-07 MED ORDER — HYDROCODONE-ACETAMINOPHEN 5-325 MG PO TABS
1.0000 | ORAL_TABLET | ORAL | Status: DC | PRN
  Administered 2021-01-07 – 2021-01-08 (×4): 2 via ORAL
  Filled 2021-01-07 (×5): qty 2

## 2021-01-07 MED ORDER — BUPIVACAINE HCL 0.25 % IJ SOLN
INTRAMUSCULAR | Status: DC | PRN
  Administered 2021-01-07 (×12): 5 mL

## 2021-01-07 MED ORDER — LIDOCAINE 2% (20 MG/ML) 5 ML SYRINGE
INTRAMUSCULAR | Status: AC
  Filled 2021-01-07: qty 5

## 2021-01-07 MED ORDER — METHOCARBAMOL 500 MG PO TABS
500.0000 mg | ORAL_TABLET | Freq: Four times a day (QID) | ORAL | Status: DC | PRN
  Administered 2021-01-07 – 2021-01-08 (×3): 500 mg via ORAL
  Filled 2021-01-07 (×3): qty 1

## 2021-01-07 MED ORDER — MIDAZOLAM HCL 2 MG/2ML IJ SOLN
2.0000 mg | Freq: Once | INTRAMUSCULAR | Status: AC
  Administered 2021-01-07: 2 mg via INTRAVENOUS

## 2021-01-07 MED ORDER — HYDROMORPHONE HCL 1 MG/ML IJ SOLN
0.2500 mg | INTRAMUSCULAR | Status: DC | PRN
  Administered 2021-01-07: 0.5 mg via INTRAVENOUS

## 2021-01-07 MED ORDER — SERTRALINE HCL 50 MG PO TABS
50.0000 mg | ORAL_TABLET | Freq: Every day | ORAL | Status: DC
  Administered 2021-01-07: 50 mg via ORAL
  Filled 2021-01-07: qty 1

## 2021-01-07 MED ORDER — FENTANYL CITRATE (PF) 100 MCG/2ML IJ SOLN
INTRAMUSCULAR | Status: DC | PRN
  Administered 2021-01-07 (×2): 25 ug via INTRAVENOUS
  Administered 2021-01-07: 100 ug via INTRAVENOUS

## 2021-01-07 MED ORDER — ONDANSETRON HCL 4 MG/2ML IJ SOLN
INTRAMUSCULAR | Status: AC
  Filled 2021-01-07: qty 2

## 2021-01-07 MED ORDER — ONDANSETRON 4 MG PO TBDP: 4.0000 mg | ORAL_TABLET | Freq: Four times a day (QID) | ORAL | Status: DC | PRN

## 2021-01-07 MED ORDER — LIDOCAINE HCL (CARDIAC) PF 100 MG/5ML IV SOSY
PREFILLED_SYRINGE | INTRAVENOUS | Status: DC | PRN
  Administered 2021-01-07: 100 mg via INTRATRACHEAL

## 2021-01-07 MED ORDER — MORPHINE SULFATE (PF) 4 MG/ML IV SOLN
1.0000 mg | INTRAVENOUS | Status: DC | PRN
  Administered 2021-01-07: 2 mg via INTRAVENOUS
  Filled 2021-01-07: qty 1

## 2021-01-07 MED ORDER — SODIUM CHLORIDE 0.9 % IV SOLN: INTRAVENOUS | Status: DC

## 2021-01-07 MED ORDER — FENTANYL CITRATE (PF) 100 MCG/2ML IJ SOLN
100.0000 ug | Freq: Once | INTRAMUSCULAR | Status: AC
  Administered 2021-01-07: 100 ug via INTRAVENOUS

## 2021-01-07 MED ORDER — ONDANSETRON HCL 4 MG/2ML IJ SOLN: 4.0000 mg | Freq: Four times a day (QID) | INTRAMUSCULAR | Status: DC | PRN

## 2021-01-07 MED ORDER — PROPOFOL 500 MG/50ML IV EMUL
INTRAVENOUS | Status: DC | PRN
  Administered 2021-01-07: 25 ug/kg/min via INTRAVENOUS

## 2021-01-07 MED ORDER — LORAZEPAM 1 MG PO TABS: 0.5000 mg | ORAL_TABLET | Freq: Two times a day (BID) | ORAL | Status: DC | PRN

## 2021-01-07 MED ORDER — MEPERIDINE HCL 25 MG/ML IJ SOLN: 6.2500 mg | INTRAMUSCULAR | Status: DC | PRN

## 2021-01-07 SURGICAL SUPPLY — 48 items
ADH SKN CLS APL DERMABOND .7 (GAUZE/BANDAGES/DRESSINGS) ×6
APL PRP STRL LF DISP 70% ISPRP (MISCELLANEOUS) ×4
APPLIER CLIP 11 MED OPEN (CLIP) ×3
APPLIER CLIP 9.375 MED OPEN (MISCELLANEOUS) ×9
APR CLP MED 11 20 MLT OPN (CLIP) ×2
APR CLP MED 9.3 20 MLT OPN (MISCELLANEOUS) ×6
BINDER BREAST XLRG (GAUZE/BANDAGES/DRESSINGS) ×2 IMPLANT
BIOPATCH RED 1 DISK 7.0 (GAUZE/BANDAGES/DRESSINGS) ×2 IMPLANT
BLADE SURG 10 STRL SS (BLADE) ×3 IMPLANT
BLADE SURG 15 STRL LF DISP TIS (BLADE) ×2 IMPLANT
BLADE SURG 15 STRL SS (BLADE) ×3
CANISTER SUCT 1200ML W/VALVE (MISCELLANEOUS) ×3 IMPLANT
CHLORAPREP W/TINT 26 (MISCELLANEOUS) ×6 IMPLANT
CLIP APPLIE 11 MED OPEN (CLIP) ×2 IMPLANT
CLIP APPLIE 9.375 MED OPEN (MISCELLANEOUS) IMPLANT
COVER BACK TABLE 60X90IN (DRAPES) ×3 IMPLANT
COVER MAYO STAND STRL (DRAPES) ×3 IMPLANT
DECANTER SPIKE VIAL GLASS SM (MISCELLANEOUS) ×3 IMPLANT
DERMABOND ADVANCED (GAUZE/BANDAGES/DRESSINGS) ×3
DERMABOND ADVANCED .7 DNX12 (GAUZE/BANDAGES/DRESSINGS) IMPLANT
DEVICE DSSCT PLSMBLD 3.0S LGHT (MISCELLANEOUS) IMPLANT
DRAIN CHANNEL 19F RND (DRAIN) ×4 IMPLANT
DRAPE LAPAROSCOPIC ABDOMINAL (DRAPES) ×3 IMPLANT
DRAPE UTILITY XL STRL (DRAPES) ×3 IMPLANT
DRSG PAD ABDOMINAL 8X10 ST (GAUZE/BANDAGES/DRESSINGS) ×3 IMPLANT
DRSG TEGADERM 2-3/8X2-3/4 SM (GAUZE/BANDAGES/DRESSINGS) ×2 IMPLANT
ELECT REM PT RETURN 9FT ADLT (ELECTROSURGICAL) ×3
ELECTRODE REM PT RTRN 9FT ADLT (ELECTROSURGICAL) ×2 IMPLANT
EVACUATOR SILICONE 100CC (DRAIN) ×4 IMPLANT
GAUZE SPONGE 4X4 12PLY STRL (GAUZE/BANDAGES/DRESSINGS) ×2 IMPLANT
GLOVE SURG ENC MOIS LTX SZ7.5 (GLOVE) ×3 IMPLANT
GLOVE SURG POLYISO LF SZ6.5 (GLOVE) ×2 IMPLANT
GLOVE SURG UNDER POLY LF SZ7 (GLOVE) ×2 IMPLANT
GOWN STRL REUS W/ TWL LRG LVL3 (GOWN DISPOSABLE) ×4 IMPLANT
GOWN STRL REUS W/TWL LRG LVL3 (GOWN DISPOSABLE) ×9
NS IRRIG 1000ML POUR BTL (IV SOLUTION) ×3 IMPLANT
PACK BASIN DAY SURGERY FS (CUSTOM PROCEDURE TRAY) ×3 IMPLANT
PIN SAFETY STERILE (MISCELLANEOUS) ×3 IMPLANT
PLASMABLADE 3.0S W/LIGHT (MISCELLANEOUS) ×3
SLEEVE SCD COMPRESS KNEE MED (STOCKING) ×3 IMPLANT
SPONGE T-LAP 18X18 ~~LOC~~+RFID (SPONGE) ×6 IMPLANT
SUT ETHILON 2 0 FS 18 (SUTURE) ×4 IMPLANT
SUT MNCRL AB 4-0 PS2 18 (SUTURE) ×4 IMPLANT
SUT SILK 2 0 SH (SUTURE) ×1 IMPLANT
SUT VICRYL 3-0 CR8 SH (SUTURE) ×4 IMPLANT
TOWEL GREEN STERILE FF (TOWEL DISPOSABLE) ×6 IMPLANT
TUBE CONNECTING 20X1/4 (TUBING) ×3 IMPLANT
YANKAUER SUCT BULB TIP NO VENT (SUCTIONS) ×3 IMPLANT

## 2021-01-07 NOTE — Anesthesia Procedure Notes (Signed)
Anesthesia Regional Block: Pectoralis block   Pre-Anesthetic Checklist: , timeout performed,  Correct Patient, Correct Site, Correct Laterality,  Correct Procedure, Correct Position, site marked,  Risks and benefits discussed,  Surgical consent,  Pre-op evaluation,  At surgeon's request and post-op pain management  Laterality: Right and N/A  Prep: chloraprep       Needles:  Injection technique: Single-shot  Needle Type: Echogenic Stimulator Needle     Needle Length: 9cm  Needle Gauge: 20   Needle insertion depth: 2 cm   Additional Needles:   Procedures:,,,, ultrasound used (permanent image in chart),,    Narrative:  Start time: 01/07/2021 8:20 AM End time: 01/07/2021 8:28 AM Injection made incrementally with aspirations every 5 mL.  Performed by: Personally  Anesthesiologist: Lyn Hollingshead, MD

## 2021-01-07 NOTE — Interval H&P Note (Signed)
History and Physical Interval Note:  01/07/2021 8:15 AM  [facility]  has presented today for surgery, with the diagnosis of RIGHT BREAST CANCER.  The various methods of treatment have been discussed with the patient and family. After consideration of risks, benefits and other options for treatment, the patient has consented to  Procedure(s): RIGHT MODIFIED RADICAL MASTECTOMY (Right) LEFT TOTAL MASTECTOMY (Left) as a surgical intervention.  The patient's history has been reviewed, patient examined, no change in status, stable for surgery.  I have reviewed the patient's chart and labs.  Questions were answered to the patient's satisfaction.     Autumn Messing III

## 2021-01-07 NOTE — Anesthesia Postprocedure Evaluation (Signed)
Anesthesia Post Note  Patient: [facility]  Procedure(s) Performed: RIGHT MODIFIED RADICAL MASTECTOMY (Right: Breast) LEFT SIMPLE MASTECTOMY (Left: Breast)     Patient location during evaluation: Phase II Anesthesia Type: General Level of consciousness: awake and sedated Pain management: pain level controlled Vital Signs Assessment: post-procedure vital signs reviewed and stable Respiratory status: spontaneous breathing Cardiovascular status: stable Postop Assessment: no apparent nausea or vomiting Anesthetic complications: no   No notable events documented.  Last Vitals:  Vitals:   01/07/21 1215 01/07/21 1230  BP: 106/70 112/72  Pulse: 78 79  Resp: 17 (!) 24  Temp:    SpO2: 100% 100%    Last Pain:  Vitals:   01/07/21 1145  TempSrc:   PainSc: 10-Worst pain ever                 Huston Foley

## 2021-01-07 NOTE — Progress Notes (Signed)
Assisted Dr. Jillyn Hidden with right and left, ultrasound guided, pectoralis block. Side rails up, monitors on throughout procedure. See vital signs in flow sheet. Tolerated Procedure well.

## 2021-01-07 NOTE — H&P (Signed)
PROVIDER: Landry Corporal, MD  MRN: [id number] DOB: [birth year] Subjective   Chief Complaint: No chief complaint on file.   History of Present Illness: [facility] is a 35 y.o. female who is seen today for right breast cancer. The patient is a 35 year old white female who was previously diagnosed in April with a 5.4 cm cancer in the upper inner right breast with 8 positive lymph nodes. The cancer was triple positive. She also has a [place] EK 2 mutation. She has completed neoadjuvant chemotherapy and is now ready to schedule her definitive surgery. She has decided on Dr. Leland Johns for reconstruction but she has decided on a delayed reconstruction. She will continue on with Herceptin therapy.  Review of Systems: A complete review of systems was obtained from the patient. I have reviewed this information and discussed as appropriate with the patient. See HPI as well for other ROS.  ROS   Medical History: Past Medical History:  Diagnosis Date   Anxiety   History of cancer   Thyroid disease   Patient Active Problem List  Diagnosis   Malignant neoplasm of upper-inner quadrant of right breast in female, estrogen receptor positive (CMS-HCC)   Past Surgical History:  Procedure Laterality Date   SALPINGECTOMY N/A   THYROIDECTOMY TOTAL N/A    No Known Allergies  Current Outpatient Medications on File Prior to Visit  Medication Sig Dispense Refill   dexAMETHasone (DECADRON) 4 MG tablet Take by mouth   levothyroxine (SYNTHROID) 175 MCG tablet Take by mouth   liothyronine (CYTOMEL) 5 MCG tablet Take by mouth   lisdexamfetamine (VYVANSE) 60 MG capsule Take 60 mg by mouth every morning   sertraline (ZOLOFT) 50 MG tablet TAKE ONE TABLET BY MOUTH AT BEDTIME FOR anxiety/depression   No current facility-administered medications on file prior to visit.   Family History  Problem Relation Age of Onset   Breast cancer Mother   High blood pressure (Hypertension) Father    Social History    Tobacco Use  Smoking Status Current Every Day Smoker  Smokeless Tobacco Never Used    Social History   Socioeconomic History   Marital status: Married  Tobacco Use   Smoking status: Current Every Day Smoker   Smokeless tobacco: Never Used  Scientific laboratory technician Use: Never used  Substance and Sexual Activity   Alcohol use: Yes   Drug use: Yes   Objective:   Vitals:  BP: 120/72  Pulse: (!) 129  Temp: 36.8 C (98.3 F)  SpO2: 100%  Weight: 78 kg (172 lb)  Height: 167.6 cm (5\' 6" )   Body mass index is 27.76 kg/m.  Physical Exam Vitals reviewed.  Constitutional:  General: She is not in acute distress. Appearance: Normal appearance.  HENT:  Head: Normocephalic and atraumatic.  Right Ear: External ear normal.  Left Ear: External ear normal.  Nose: Nose normal.  Mouth/Throat:  Mouth: Mucous membranes are moist.  Pharynx: Oropharynx is clear.  Eyes:  General: No scleral icterus. Extraocular Movements: Extraocular movements intact.  Conjunctiva/sclera: Conjunctivae normal.  Pupils: Pupils are equal, round, and reactive to light.  Cardiovascular:  Rate and Rhythm: Normal rate and regular rhythm.  Pulses: Normal pulses.  Heart sounds: Normal heart sounds.  Pulmonary:  Effort: Pulmonary effort is normal. No respiratory distress.  Breath sounds: Normal breath sounds.  Abdominal:  General: Bowel sounds are normal.  Palpations: Abdomen is soft.  Tenderness: There is no abdominal tenderness.  Musculoskeletal:  General: No swelling, tenderness or deformity. Normal  range of motion.  Cervical back: Normal range of motion and neck supple.  Skin: General: Skin is warm and dry.  Coloration: Skin is not jaundiced.  Neurological:  General: No focal deficit present.  Mental Status: She is alert and oriented to person, place, and time.  Psychiatric:  Mood and Affect: Mood normal.  Behavior: Behavior normal.   Breast: There is no palpable mass in either breast. There is  no palpable axillary, supraclavicular, or cervical lymphadenopathy.  Labs, Imaging and Diagnostic Testing:  Assessment and Plan:  Diagnoses and all orders for this visit:  Malignant neoplasm of upper-inner quadrant of right breast in female, estrogen receptor positive (CMS-HCC) - [facility] Case Posting Request; Future    The patient has a known locally advanced right breast cancer and has responded well to neoadjuvant chemotherapy. She is now ready to schedule her definitive surgery. Because of her advanced cancer, young age, and [place] EK 2 mutation she is elected for a left prophylactic mastectomy in addition to a right modified radical mastectomy. I feel like this is a very reasonable choice for her. She is interested in reconstruction but is favoring a delayed reconstruction after she has completed all her treatment. I think this is very reasonable. I have discussed with her in detail the risks and benefits of the operation as well as some of the technical aspects and she understands and wishes to proceed. She would like to do it after her family vacation which falls between October 15 and 22.

## 2021-01-07 NOTE — Discharge Instructions (Signed)

## 2021-01-07 NOTE — Anesthesia Procedure Notes (Signed)
Anesthesia Regional Block: Pectoralis block   Pre-Anesthetic Checklist: , timeout performed,  Correct Patient, Correct Site, Correct Laterality,  Correct Procedure, Correct Position, site marked,  Risks and benefits discussed,  Surgical consent,  Pre-op evaluation,  At surgeon's request and post-op pain management  Laterality: Left and N/A  Prep: chloraprep       Needles:  Injection technique: Single-shot  Needle Type: Echogenic Stimulator Needle     Needle Length: 9cm  Needle Gauge: 20   Needle insertion depth: 2 cm   Additional Needles:   Procedures:,,,, ultrasound used (permanent image in chart),,    Narrative:  Start time: 01/07/2021 8:10 AM End time: 01/07/2021 8:18 AM Injection made incrementally with aspirations every 5 mL.  Performed by: Personally  Anesthesiologist: Lyn Hollingshead, MD

## 2021-01-07 NOTE — Op Note (Signed)
01/07/2021  11:29 AM  PATIENT:  Kayla Little  35 y.o. female  PRE-OPERATIVE DIAGNOSIS:  RIGHT BREAST CANCER, CHEK 2 +  POST-OPERATIVE DIAGNOSIS:  RIGHT BREAST CANCER, CHEK2 +  PROCEDURE:  Procedure(s): RIGHT MODIFIED RADICAL MASTECTOMY (Right) LEFT SIMPLE MASTECTOMY (Left)  SURGEON:  Surgeon(s) and Role:    * Jovita Kussmaul, MD - Primary    * Carlena Hurl, PA-C - Assisting  PHYSICIAN ASSISTANT:   ASSISTANTS: as above   ANESTHESIA:   general  EBL:  50 mL   BLOOD ADMINISTERED:none  DRAINS: (2) Blake drain(s) in the prepectoral space    LOCAL MEDICATIONS USED:  NONE  SPECIMEN:  Source of Specimen:  right breast with axillary contents, left breast  DISPOSITION OF SPECIMEN:  PATHOLOGY  COUNTS:  YES  TOURNIQUET:  * No tourniquets in log *  DICTATION: .Dragon Dictation  After informed consent was obtained the patient was brought to the operating room and placed in the supine position on the operating table.  After adequate induction of general anesthesia the patient's bilateral chest, breast, and axillary areas were prepped with ChloraPrep, allowed to dry, and draped in usual sterile manner.  An appropriate timeout was performed.  Attention was first turned to the left breast.  An elliptical incision was made around the nipple and areolar complex with a 10 blade knife in order to minimize the excess skin.  The incision was carried through the skin and subcutaneous tissue sharply with the PlasmaBlade.  Breast hooks were used to elevate the skin flaps anteriorly towards the ceiling.  Thin skin flaps were then created by dissecting between the breast tissue and the subcutaneous fat and skin.  This dissection was carried all the way to the chest wall circumferentially.  Next the breast was removed from the pectoralis muscle with the pectoralis fascia.  Once this was accomplished the entire left breast was removed from the patient.  The specimen was marked with a stitch on the lateral  skin and sent to pathology for further evaluation.  There did appear to be 1 or 2 low-lying lymph nodes that were removed with the tail of the breast.  Hemostasis was achieved using the PlasmaBlade.  The wound was irrigated with saline.  A small stab incision was made near the anterior axillary line inferior to the operative bed.  A tonsil clamp was placed through this opening and used to bring a 19 [place] round Blake drain into the operative bed.  The drain was curled along the chest wall.  The drain was anchored to the skin with a 3-0 nylon stitch.  Next the superior and inferior skin flaps were grossly reapproximated with interrupted 3-0 Vicryl stitches.  The skin was then closed with a running 4-0 Monocryl subcuticular stitch.  The drain was placed to bulb suction and there was a good seal.  The skin flaps appeared healthy.  Attention was then turned to the right breast.  A similar elliptical incision was made around the nipple and areolar complex in order to minimize the excess skin with a 10 blade knife.  The incision was carried through the skin and subcutaneous tissue sharply with the PlasmaBlade.  Breast hooks were used to elevate the skin flaps anteriorly towards the ceiling.  Thin skin flaps were then created by dissecting between the breast tissue and the subcutaneous fat and skin.  This dissection was carried circumferentially all the way to the chest wall.  Laterally the dissection was carried all the way to the  latissimus muscle.  Next the breast was removed from the pectoralis muscle with the pectoralis fashion.  Once the dissection reached the axilla the breast was separated from the serratus muscle medially.  The axillary vein superiorly was also identified.  The axillary contents within the boundaries of the axillary vein, serratus muscle medially, and latissimus muscle laterally were then dissected out by blunt right angle dissection.  Several small vessels and intercostal brachial nerves were  controlled with clips.  Once this was accomplished then the entire right axillary contents up to the level of the axillary vein were removed with the breast.  The breast was marked with a stitch on the lateral skin for orientation.  The specimen was then sent to pathology for further evaluation.  Hemostasis was achieved using the PlasmaBlade.  A couple small perforating vessels medially on both sides were controlled with figure-of-eight 3-0 Vicryl stitches.  The long thoracic and thoracodorsal neurovascular complexes were identified and spared.  Next a small stab incision was made near the anterior axillary line inferior to the operative bed.  A tonsil clamp was placed through this opening and used to bring a 19 [place] round Blake drain into the operative bed.  The drain was curled along the chest wall.  The drain was anchored to the skin with a 3-0 nylon stitch.  Next the superior and inferior skin flaps were grossly reapproximated with interrupted 3-0 Vicryl stitches.  The skin was then closed with running 4-0 Monocryl subcuticular stitches.  The skin flaps appeared healthy.  The drain was placed to bulb suction and there was a good seal.  Dermabond dressings were applied.  The patient tolerated the procedure well.  At the end of the case all needle sponge and instrument counts were correct.  The patient was then awakened and taken to recovery in stable condition.  PLAN OF CARE: Admit for overnight observation  PATIENT DISPOSITION:  PACU - hemodynamically stable.   Delay start of Pharmacological VTE agent (>24hrs) due to surgical blood loss or risk of bleeding: no

## 2021-01-07 NOTE — Transfer of Care (Signed)
Immediate Anesthesia Transfer of Care Note  Patient: Kayla Little  Procedure(s) Performed: RIGHT MODIFIED RADICAL MASTECTOMY (Right: Breast) LEFT SIMPLE MASTECTOMY (Left: Breast)  Patient Location: PACU  Anesthesia Type:General  Level of Consciousness: drowsy, patient cooperative and responds to stimulation  Airway & Oxygen Therapy: Patient Spontanous Breathing and Patient connected to face mask oxygen  Post-op Assessment: Report given to RN and Post -op Vital signs reviewed and stable  Post vital signs: Reviewed and stable  Last Vitals:  Vitals Value Taken Time  BP    Temp    Pulse 85 01/07/21 1137  Resp 21 01/07/21 1137  SpO2 100 % 01/07/21 1137  Vitals shown include unvalidated device data.  Last Pain:  Vitals:   01/07/21 0647  TempSrc: Oral  PainSc: 0-No pain      Patients Stated Pain Goal: 4 (23/41/44 3601)  Complications: No notable events documented.

## 2021-01-07 NOTE — Anesthesia Procedure Notes (Signed)
Procedure Name: Intubation Date/Time: 01/07/2021 8:43 AM Performed by: Glory Buff, CRNA Pre-anesthesia Checklist: Patient identified, Emergency Drugs available, Suction available and Patient being monitored Patient Re-evaluated:Patient Re-evaluated prior to induction Oxygen Delivery Method: Circle system utilized Preoxygenation: Pre-oxygenation with 100% oxygen Induction Type: IV induction Ventilation: Mask ventilation without difficulty Laryngoscope Size: Miller and 3 Grade View: Grade I Tube type: Oral Tube size: 7.0 mm Number of attempts: 1 Airway Equipment and Method: Stylet and Oral airway Placement Confirmation: ETT inserted through vocal cords under direct vision, positive ETCO2 and breath sounds checked- equal and bilateral Secured at: 21 cm Tube secured with: Tape Dental Injury: Teeth and Oropharynx as per pre-operative assessment

## 2021-01-08 ENCOUNTER — Encounter (HOSPITAL_BASED_OUTPATIENT_CLINIC_OR_DEPARTMENT_OTHER): Payer: Self-pay | Admitting: General Surgery

## 2021-01-08 DIAGNOSIS — C50211 Malignant neoplasm of upper-inner quadrant of right female breast: Secondary | ICD-10-CM | POA: Diagnosis not present

## 2021-01-08 MED ORDER — METHOCARBAMOL 500 MG PO TABS: 500.0000 mg | ORAL_TABLET | Freq: Four times a day (QID) | ORAL | 2 refills | Status: DC | PRN

## 2021-01-08 MED ORDER — HYDROCODONE-ACETAMINOPHEN 5-325 MG PO TABS: 1.0000 | ORAL_TABLET | Freq: Four times a day (QID) | ORAL | 0 refills | Status: DC | PRN

## 2021-01-08 NOTE — Progress Notes (Signed)
1 Day Post-Op   Subjective/Chief Complaint: Complains of soreness   Objective: Vital signs in last 24 hours: Temp:  [98.4 F (36.9 C)-98.7 F (37.1 C)] 98.5 F (36.9 C) (11/03 0800) Pulse Rate:  [60-88] 75 (11/03 0815) Resp:  [16-19] 18 (11/03 0815) BP: (91-115)/(60-77) 101/68 (11/03 0800) SpO2:  [97 %-100 %] 99 % (11/03 0815)    Intake/Output from previous day: 11/02 0701 - 11/03 0700 In: 2274.8 [P.O.:680; I.V.:1494.8; IV Piggyback:100] Out: 1537 [Urine:1300; Drains:187; Blood:50] Intake/Output this shift: No intake/output data recorded.  General appearance: alert and cooperative Resp: clear to auscultation bilaterally Cardio: regular rate and rhythm GI: soft, nontender. Chest wall flaps healthy  Lab Results:  No results for input(s): WBC, HGB, HCT, PLT in the last 72 hours. BMET No results for input(s): NA, K, CL, CO2, GLUCOSE, BUN, CREATININE, CALCIUM in the last 72 hours. PT/INR No results for input(s): LABPROT, INR in the last 72 hours. ABG No results for input(s): PHART, HCO3 in the last 72 hours.  Invalid input(s): PCO2, PO2  Studies/Results: No results found.  Anti-infectives: Anti-infectives (From admission, onward)    Start     Dose/Rate Route Frequency Ordered Stop   01/07/21 0645  ceFAZolin (ANCEF) IVPB 2g/100 mL premix        2 g 200 mL/hr over 30 Minutes Intravenous On call to O.R. 01/07/21 0634 01/07/21 0914       Assessment/Plan: s/p Procedure(s): RIGHT MODIFIED RADICAL MASTECTOMY (Right) LEFT SIMPLE MASTECTOMY (Left) Advance diet Discharge  LOS: 0 days    Autumn Messing III 01/08/2021

## 2021-01-13 ENCOUNTER — Encounter: Payer: Self-pay | Admitting: *Deleted

## 2021-01-13 DIAGNOSIS — C50211 Malignant neoplasm of upper-inner quadrant of right female breast: Secondary | ICD-10-CM

## 2021-01-13 DIAGNOSIS — Z170 Estrogen receptor positive status [ER+]: Secondary | ICD-10-CM

## 2021-01-15 ENCOUNTER — Other Ambulatory Visit: Payer: Self-pay

## 2021-01-15 ENCOUNTER — Encounter: Payer: Self-pay | Admitting: Adult Health

## 2021-01-15 ENCOUNTER — Inpatient Hospital Stay (HOSPITAL_BASED_OUTPATIENT_CLINIC_OR_DEPARTMENT_OTHER): Payer: 59 | Admitting: Adult Health

## 2021-01-15 ENCOUNTER — Inpatient Hospital Stay: Payer: 59 | Attending: Oncology

## 2021-01-15 ENCOUNTER — Inpatient Hospital Stay: Payer: 59

## 2021-01-15 VITALS — BP 119/85 | HR 81 | Temp 97.5°F | Resp 18 | Ht 66.0 in | Wt 168.6 lb

## 2021-01-15 DIAGNOSIS — Z170 Estrogen receptor positive status [ER+]: Secondary | ICD-10-CM

## 2021-01-15 DIAGNOSIS — F32A Depression, unspecified: Secondary | ICD-10-CM | POA: Insufficient documentation

## 2021-01-15 DIAGNOSIS — C50211 Malignant neoplasm of upper-inner quadrant of right female breast: Secondary | ICD-10-CM | POA: Insufficient documentation

## 2021-01-15 DIAGNOSIS — Z5112 Encounter for antineoplastic immunotherapy: Secondary | ICD-10-CM | POA: Insufficient documentation

## 2021-01-15 DIAGNOSIS — F17210 Nicotine dependence, cigarettes, uncomplicated: Secondary | ICD-10-CM | POA: Diagnosis not present

## 2021-01-15 DIAGNOSIS — C73 Malignant neoplasm of thyroid gland: Secondary | ICD-10-CM | POA: Diagnosis not present

## 2021-01-15 DIAGNOSIS — F988 Other specified behavioral and emotional disorders with onset usually occurring in childhood and adolescence: Secondary | ICD-10-CM | POA: Insufficient documentation

## 2021-01-15 DIAGNOSIS — Z95828 Presence of other vascular implants and grafts: Secondary | ICD-10-CM

## 2021-01-15 DIAGNOSIS — E3122 Multiple endocrine neoplasia [MEN] type IIA: Secondary | ICD-10-CM

## 2021-01-15 DIAGNOSIS — Z9013 Acquired absence of bilateral breasts and nipples: Secondary | ICD-10-CM | POA: Insufficient documentation

## 2021-01-15 LAB — CBC WITH DIFFERENTIAL/PLATELET
Abs Immature Granulocytes: 0.03 10*3/uL (ref 0.00–0.07)
Basophils Absolute: 0 10*3/uL (ref 0.0–0.1)
Basophils Relative: 0 %
Eosinophils Absolute: 0.2 10*3/uL (ref 0.0–0.5)
Eosinophils Relative: 2 %
HCT: 40.7 % (ref 36.0–46.0)
Hemoglobin: 13.4 g/dL (ref 12.0–15.0)
Immature Granulocytes: 0 %
Lymphocytes Relative: 23 %
Lymphs Abs: 2.2 10*3/uL (ref 0.7–4.0)
MCH: 34.7 pg — ABNORMAL HIGH (ref 26.0–34.0)
MCHC: 32.9 g/dL (ref 30.0–36.0)
MCV: 105.4 fL — ABNORMAL HIGH (ref 80.0–100.0)
Monocytes Absolute: 0.7 10*3/uL (ref 0.1–1.0)
Monocytes Relative: 7 %
Neutro Abs: 6.4 10*3/uL (ref 1.7–7.7)
Neutrophils Relative %: 68 %
Platelets: 319 10*3/uL (ref 150–400)
RBC: 3.86 MIL/uL — ABNORMAL LOW (ref 3.87–5.11)
RDW: 11.9 % (ref 11.5–15.5)
WBC: 9.6 10*3/uL (ref 4.0–10.5)
nRBC: 0 % (ref 0.0–0.2)

## 2021-01-15 LAB — COMPREHENSIVE METABOLIC PANEL
ALT: 90 U/L — ABNORMAL HIGH (ref 0–44)
AST: 32 U/L (ref 15–41)
Albumin: 3.8 g/dL (ref 3.5–5.0)
Alkaline Phosphatase: 64 U/L (ref 38–126)
Anion gap: 9 (ref 5–15)
BUN: 13 mg/dL (ref 6–20)
CO2: 23 mmol/L (ref 22–32)
Calcium: 9 mg/dL (ref 8.9–10.3)
Chloride: 109 mmol/L (ref 98–111)
Creatinine, Ser: 0.76 mg/dL (ref 0.44–1.00)
GFR, Estimated: >60 mL/min (ref >60)
Glucose, Bld: 103 mg/dL — ABNORMAL HIGH (ref 70–99)
Potassium: 4.4 mmol/L (ref 3.5–5.1)
Sodium: 141 mmol/L (ref 135–145)
Total Bilirubin: <0.2 mg/dL — ABNORMAL LOW (ref 0.3–1.2)
Total Protein: 6.9 g/dL (ref 6.5–8.1)

## 2021-01-15 LAB — PREGNANCY, URINE: Preg Test, Ur: NEGATIVE

## 2021-01-15 MED ORDER — SODIUM CHLORIDE 0.9% FLUSH
10.0000 mL | INTRAVENOUS | Status: AC | PRN
  Administered 2021-01-15: 10 mL

## 2021-01-15 MED ORDER — LORAZEPAM 0.5 MG PO TABS: 0.5000 mg | ORAL_TABLET | Freq: Two times a day (BID) | ORAL | 0 refills | Status: DC | PRN

## 2021-01-15 NOTE — Progress Notes (Addendum)
[place]  [phone] [phone]     ID: KAREEM AUL DOB: 04/19/1985  MR#: [id number]  VPX#:[id number]  Patient Care Team: [name] [place] as PCP - General (Family Medicine) Magrinat, Virgie Dad, MD as Consulting Physician (Oncology) Kyung Rudd, MD as Consulting Physician (Radiation Oncology) Armandina Gemma, MD as Consulting Physician (General Surgery) Rockwell Germany, RN as Registered Nurse Mauro Kaufmann, RN as Registered Nurse Scot Dock, NP OTHER MD:  CHIEF COMPLAINT: Triple positive locally advanced breast cancer  CURRENT TREATMENT: Neoadjuvant chemotherapy   INTERVAL HISTORY: Joellen Jersey returns today for follow up and treatment of her locally advanced breast cancer.   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  Margins were negative.  Microscopic metastatic carcinoma was noted in 1 out of 15 lymph nodes biopsied.  Joellen Jersey is healing from her surgery well.  She notes that she does have some discomfort and slight swelling along the left lower lateral mastectomy site.  She has 2 drains in place that are draining about 30 cc a day of serosanguineous drainage.  She denies any new issues such as fever chills cough bowel bladder issues.  [name]'s most recent echocardiogram was completed on December 29, 2020 and it showed a left ventricular ejection fraction of 60 to 65%.    REVIEW OF SYSTEMS: Review of Systems  Constitutional:  Positive for fatigue. Negative for appetite change, chills, fever and unexpected weight change.  HENT:   Negative for hearing loss, lump/mass and trouble swallowing.   Eyes:  Negative for eye problems and icterus.  Respiratory:  Negative for chest tightness, cough and shortness of breath.   Cardiovascular:  Negative for chest  pain, leg swelling and palpitations.  Gastrointestinal:  Negative for abdominal distention, abdominal pain, constipation, diarrhea, nausea and vomiting.  Endocrine: Negative for hot flashes.  Genitourinary:  Negative for difficulty urinating.   Musculoskeletal:  Negative for arthralgias.  Skin:  Negative for itching and rash.  Neurological:  Negative for dizziness, extremity weakness, headaches and numbness.  Hematological:  Negative for adenopathy. Does not bruise/bleed easily.  Psychiatric/Behavioral:  Negative for depression. The patient is not nervous/anxious.      COVID 19 VACCINATION STATUS: Status post Moderna x2 followed by [employer] booster   HISTORY OF CURRENT ILLNESS: From the original intake note:  [name] (the daughter of my patient, Phala Schraeder, whom I saw earlier today). has a history of medullary thyroid cancer, status post total thyroidectomy on 04/12/2019 under Dr. Harlow Asa.   More recently she presented to her GYN with a right supraclavicular lump, prompting neck and chest CT on 06/24/2020. Neck CT showed: no recurrent mass in thyroid bed, palpable abnormality corresponds to a cluster of 2 lymph nodes in right supraclavicular region; prominent lym ph nodes in neck bilaterally. Chest CT showed: enhancing 1.4 cm lesion in medial right breast with associated mildly enlarged right axillary lymph nodes; 1.2 cm lesion in left hepatic lobe with central hyperattenuation; enlargement of right infrahilar and right hilar nodal tissue is borderline suspicious; sclerotic lesion with spiculated margins at T7 level favored to represent a bone island.  She underwent bilateral diagnostic mammography with tomography and bilateral breast ultrasonography at [facility] on 06/27/2020 showing: breast density category D; Right Breast 1. 2.6 cm irregular mass in inner breast, with associated microcalcifications spanning up to  5.1 cm, corresponding to chest CT finding. 2. Adjacent 0.9 cm mass in  inner breast, 2 cm away from dominant mass. 3. Two indeterminate masses in outer breast. 4. At least 3 enlarged lymph nodes in right axilla  Left Breast 1. Indeterminate 1.2 cm mass in lower-outer breast, possibly a fibroadenoma 2. No enlarged lymphadenopathy in left axilla.  Accordingly on 06/30/2020 she proceeded to biopsy of the right breast area in question. The pathology from this procedure ([id number]) showed:  1. Dominant mass  - invasive ductal carcinoma, grade 1  - ductal carcinoma in situ  - Prognostic indicators significant for: estrogen receptor, 90% positive and progesterone receptor, 80% positive, both with moderate staining intensity.  HER2 equivocal by immunohistochemistry (2+), but positive by FISH, with a signals ratio of 2.2 and the number per cell greater than 5.. 2. Smaller mass  - invasive ductal carcinoma, grade 1  - ductal carcinoma in situ  - Prognostic indicators significant for: estrogen receptor, 100% positive with strong staining intensity and progesterone receptor, 50% positive with moderate staining intensity.  HER2 positive by immunohistochemistry (3+). 3. Right axillary lymph node  - invasive ductal carcinoma, grade 1 Prognostic indicators significant for: estrogen receptor, 100% positive and progesterone receptor, 90% positive, both with strong staining intensity. HER2 equivocal by immunohistochemistry (2+), {but (positive or negative) by fluorescent in situ hybridization   Biopsy of the left breast area was performed the same day. Pathology from the procedure ([id number]) was benign.  She underwent breast MRI on 07/06/2020 showing: breast composition C; enhancing mass with adjacent non-mass enhancement in lower-inner right breast, which together measure 5.4 cm, corresponding with two biopsy-proven areas of malignancy; no other suspicious enhancement identified in right breast; one 4 mm right internal mammary lymph node with unclear significance; numerous abnormal  right axillary lymph nodes; no evidence of left breast malignancy.  Cancer Staging Malignant neoplasm of upper-inner quadrant of right breast in female, estrogen receptor positive ([place]) Staging form: Breast, AJCC 8th Edition - Clinical stage from 07/08/2020: Stage IIIA (cT3, cN3, cM0, G1, ER+, PR+, HER2+) - Unsigned Stage prefix: Initial diagnosis Histologic grading system: 3 grade system  The patient's subsequent history is as detailed below.   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)    Hx of varicella    Kidney stone 2012   Medullary thyroid carcinoma ([place])    Monoallelic mutation of CHEK2 gene in female patient    Postpartum care following vaginal delivery (7/27) 10/02/2014, June 16 , 2020   Thyroid cancer [facility]) 2021    PAST SURGICAL HISTORY: Past Surgical History:  Procedure Laterality Date   IR IMAGING GUIDED PORT INSERTION  07/22/2020   NO PAST SURGERIES     THYROIDECTOMY N/A 04/12/2019   Procedure: TOTAL THYROIDECTOMY WITH LIMITED LYMPH NODE DISSECTION;  Surgeon: Armandina Gemma, MD;  Location: [facility];  Service: General;  Laterality: N/A;   UNILATERAL SALPINGECTOMY Right 07/09/2017   Procedure: RIGHT SALPINGECTOMY WITH REMOVAL OF ECTOPIC PREGNANCY;  Surgeon: Azucena Fallen, MD;  Location: [facility];  Service: Gynecology;  Laterality: Right;    FAMILY HISTORY: Family History  Problem Relation Age of Onset   Breast cancer Mother 26       2 types of cancer one side mastectomy with chemo   Endometriosis Mother    Endometriosis Maternal Grandmother    Breast cancer Maternal Grandmother        post menopausal   Diabetes Maternal Grandmother    Cancer  Maternal Grandfather        prostate and lymphoma   Alzheimer's disease Paternal Grandmother    Cancer Father 88       prostate cancer   Aneurysm Paternal Grandfather        d. 67s   Please see genetic counseling note dated 11/23/2018 for full family information.   GYNECOLOGIC HISTORY:  No LMP recorded. (Menstrual status: IUD). Menarche: 35 years old Age at first live birth: 35 years old [place] P 2 Patient has a Mirena IUD in place HRT n/a  Hysterectomy? no BSO?  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  Her husband Barnabas Lister works for [employer].  Their children are Dylan 5 and Emma 2.  Barnabas Lister has a 63 year old daughter, the patient's stepdaughter Hildred Alamin, currently attending [employer].  The patient is a [employer]    ADVANCED DIRECTIVES: In the absence of any documents to the contrary the patient's husband is her healthcare power of attorney   HEALTH MAINTENANCE: Social History   Tobacco Use   Smoking status: Some Days    Packs/day: 0.50    Years: 5.00    Pack years: 2.50    Types: Cigarettes   Smokeless tobacco: Never   Tobacco comments:    Actively trying to quit.  Vaping Use   Vaping Use: Never used  Substance Use Topics   Alcohol use: Yes    Alcohol/week: 7.0 - 14.0 standard drinks    Types: 7 - 14 Glasses of wine per week    Comment: glass of wine each night    Drug use: Not Currently    Types: Marijuana    Comment: not during pregnancy, last use was 03/27/2019     Colonoscopy: n/a (age)  PAP:   Bone density: n/a (age)   No Known Allergies  Current Outpatient Medications  Medication Sig Dispense Refill   acetaminophen (TYLENOL) 325 MG tablet Take 2 tablets (650 mg total) by mouth every 4 (four) hours as needed (for pain scale < 4). 100 tablet 0   amoxicillin-clavulanate (AUGMENTIN) 875-125 MG tablet Take 1 tablet by mouth 2 (two) times daily. 10 tablet 0    calcitRIOL (ROCALTROL) 0.5 MCG capsule Take 1 capsule (0.5 mcg total) by mouth 2 (two) times a week. 30 capsule 1   levonorgestrel (MIRENA) 20 MCG/24HR IUD 1 each by Intrauterine route once.     levothyroxine (SYNTHROID) 175 MCG tablet Take 175 mcg by mouth daily before breakfast.     liothyronine (CYTOMEL) 5 MCG tablet Take 5 mcg by mouth daily.     loperamide (IMODIUM) 2 MG capsule Take 2 mg by mouth as needed for diarrhea or loose stools.     loratadine (CLARITIN) 10 MG tablet Take 1 tablet (10 mg total) by mouth daily. 90 tablet 0   LORazepam (ATIVAN) 0.5 MG tablet Take 1 tablet (0.5 mg total) by mouth every 12 (twelve) hours as needed for anxiety. 30 tablet 0   sertraline (ZOLOFT) 50 MG tablet Take 50 mg by mouth at bedtime.     valACYclovir (  VALTREX) 500 MG tablet Take 1 tablet (500 mg total) by mouth daily. 90 tablet 0   VYVANSE 60 MG capsule Take 60 mg by mouth every morning.     No current facility-administered medications for this visit.    OBJECTIVE:  Vitals:   11/27/20 1040  BP: 117/68  Pulse: (!) 115  Resp: 20  Temp: 98.2 F (36.8 C)  SpO2: 100%      Body mass index is 27.57 kg/m.   Wt Readings from Last 3 Encounters:  11/27/20 170 lb 12.8 oz (77.5 kg)  11/06/20 165 lb 11.2 oz (75.2 kg)  10/16/20 163 lb 9.6 oz (74.2 kg)  ECOG FS:1 - Symptomatic but completely ambulatory GENERAL: Patient is a well appearing female in no acute distress HEENT:  Sclerae anicteric.  Mask in place. Neck is supple.  NODES:  No cervical, supraclavicular, or axillary lymphadenopathy palpated.  BREAST EXAM: Status post bilateral mastectomies.  She is wearing a bra and binder.  Underneath her mastectomy sites are healing well there is no erythema or swelling noted in her drains are in place. LUNGS:  Clear to auscultation bilaterally.  No wheezes or rhonchi. HEART:  Regular rate and rhythm. No murmur appreciated. ABDOMEN:  Soft, nontender.  Positive, normoactive bowel sounds. No organomegaly  palpated. MSK:  No focal spinal tenderness to palpation. Full range of motion bilaterally in the upper extremities. EXTREMITIES:  No peripheral edema.   SKIN:  Clear with no obvious rashes or skin changes. No nail dyscrasia. NEURO:  Nonfocal. Well oriented.  Appropriate affect.    LAB RESULTS:  CMP     Component Value Date/Time   NA 139 11/06/2020 1044   NA 142 02/16/2017 1332   K 4.1 11/06/2020 1044   CL 104 11/06/2020 1044   CO2 20 (L) 11/06/2020 1044   GLUCOSE 130 (H) 11/06/2020 1044   BUN 10 11/06/2020 1044   BUN 9 02/16/2017 1332   CREATININE 0.76 11/06/2020 1044   CREATININE 0.55 09/25/2020 1039   CREATININE 0.82 12/04/2012 1732   CALCIUM 9.5 11/06/2020 1044   PROT 7.6 11/06/2020 1044   PROT 6.5 02/16/2017 1332   ALBUMIN 4.7 11/06/2020 1044   ALBUMIN 4.3 02/16/2017 1332   AST 20 11/06/2020 1044   AST 20 09/25/2020 1039   ALT 20 11/06/2020 1044   ALT 27 09/25/2020 1039   ALKPHOS 53 11/06/2020 1044   BILITOT 0.4 11/06/2020 1044   BILITOT 0.7 09/25/2020 1039   GFRNONAA >60 11/06/2020 1044   GFRNONAA >60 09/25/2020 1039   GFRAA >60 04/15/2019 2145    No results found for: TOTALPROTELP, ALBUMINELP, A1GS, A2GS, BETS, BETA2SER, GAMS, MSPIKE, SPEI  Lab Results  Component Value Date   WBC 10.7 (H) 11/06/2020   NEUTROABS 7.7 11/06/2020   HGB 10.7 (L) 11/06/2020   HCT 31.6 (L) 11/06/2020   MCV 106.8 (H) 11/06/2020   PLT 349 11/06/2020    No results found for: LABCA2  No components found for: [id number]  No results for input(s): INR in the last 168 hours.  No results found for: LABCA2  No results found for: YVO592  No results found for: CAN125  No results found for: TWK462  Lab Results  Component Value Date   CA2729 15.9 07/30/2020    No components found for: HGQUANT  Lab Results  Component Value Date   CEA1 5.17 (H) 07/30/2020   /  CEA ([facility])  Date Value Ref Range Status  07/30/2020 5.17 (H) 0.00 - 5.00 ng/mL Final  Comment:     (NOTE) This test was performed using Architect's Chemiluminescent Microparticle Immunoassay. Values obtained from different assay methods cannot be used interchangeably. Please note that 5-10% of patients who smoke may see CEA levels up to 6.9 ng/mL. Performed at [facility], [place] [address] [place], [place] [address]      No results found for: AFPTUMOR  No results found for: CHROMOGRNA  No results found for: KPAFRELGTCHN, LAMBDASER, KAPLAMBRATIO (kappa/lambda light chains)  No results found for: HGBA, HGBA2QUANT, HGBFQUANT, HGBSQUAN (Hemoglobinopathy evaluation)   No results found for: LDH  No results found for: IRON, TIBC, IRONPCTSAT (Iron and TIBC)  No results found for: FERRITIN  Urinalysis    Component Value Date/Time   COLORURINE RED (A) 10/18/2020 1107   APPEARANCEUR HAZY (A) 10/18/2020 1107   LABSPEC 1.009 10/18/2020 1107   PHURINE 8.0 10/18/2020 1107   GLUCOSEU NEGATIVE 10/18/2020 1107   HGBUR LARGE (A) 10/18/2020 1107   BILIRUBINUR NEGATIVE 10/18/2020 1107   BILIRUBINUR small (A) 02/16/2017 1216   BILIRUBINUR small 12/04/2012 [place] 10/18/2020 1107   PROTEINUR 30 (A) 10/18/2020 1107   UROBILINOGEN 0.2 02/16/2017 1216   UROBILINOGEN 0.2 09/18/2013 1555   NITRITE NEGATIVE 10/18/2020 1107   [place] 10/18/2020 1107    STUDIES: MR BREAST BILATERAL [place] CAD  Result Date: 11/07/2020 CLINICAL DATA:  Breast cancer, assess treatment response. Status post neoadjuvant chemotherapy. 35 year old female with diagnosis of RIGHT breast cancer, originally identified on chest CT performed on 06/24/2020. Status post 2 ultrasound-guided biopsies of masses in the medial RIGHT breast, both demonstrating invasive ductal carcinoma. Also, a RIGHT axillary mass was biopsied showing invasive ductal carcinoma with no residual nodal tissue, compatible with additional primary focus or an entirely replaced metastatic  lymph node. Status post benign LEFT breast biopsy. LABS:  Not performed at imaging site. EXAM: BILATERAL BREAST MRI WITH AND WITHOUT CONTRAST TECHNIQUE: Multiplanar, multisequence MR images of both breasts were obtained prior to and following the intravenous administration of 7 ml of Gadavist Three-dimensional MR images were rendered by post-processing of the original MR data on an independent workstation. The three-dimensional MR images were interpreted, and findings are reported in the following complete MRI report for this study. Three dimensional images were evaluated at the independent interpreting workstation using the DynaCAD thin client. COMPARISON:  Breast MRI dated 07/06/2020. FINDINGS: Breast composition: c. Heterogeneous fibroglandular tissue. Background parenchymal enhancement: Mild Right breast: Persistent non-mass enhancement within the inner RIGHT breast, measuring 5.2 x 2.9 cm, not significantly changed in extent but significantly decreased in volume of enhancement and with decreased signal intensity indicating a good response to interval treatment (but not a complete imaging response), with 2 associated biopsy clips. Specifically, the dominant irregular mass within the inner RIGHT breast is significantly reduced in size, with only a thin component remaining. However, there is new focal non-mass enhancement within the lower outer quadrant of the RIGHT breast, measuring 9 mm, with persistent enhancement kinetics (series 11, image 109). Left breast: No suspicious enhancing mass, non-mass enhancement or secondary signs of malignancy within the LEFT breast. Biopsy clip artifact within the lower outer quadrant of the LEFT breast, corresponding to an earlier benign biopsy site (by outside report). Lymph nodes: There are now no enlarged lymph nodes identified in the RIGHT axilla, indicating a significant improvement. One of the lymph nodes in the lower RIGHT axilla contains a biopsy clip, corresponding to  the earlier biopsy site revealing invasive carcinoma (presumed  axillary lymph node metastasis with complete replacement of the lymph node), significantly decreased in size compared to the previous MRI. There are no enlarged lymph nodes identified within the LEFT axilla. The previously demonstrated 4 mm lymph node within the internal mammary chain region also appears smaller on today's exam. No enlarged lymph nodes are seen within the internal mammary chain regions bilaterally. Ancillary findings:  None. IMPRESSION: 1. New focal non-mass enhancement within the lower outer quadrant of the RIGHT breast, measuring 9 mm, with persistent enhancement kinetics (series 11, image 109). This is nonspecific but could represent new multicentric disease. If breast conservation surgery for the RIGHT breast is being considered, recommend MRI-guided biopsy. 2. Although there is persistent non-mass enhancement within the inner RIGHT breast, which includes 2 sites of biopsy-proven invasive carcinoma with associated biopsy clips, the overall volume and intensity of the non-mass enhancement is significantly reduced indicating a good response to interval treatment. 3. There are now no enlarged lymph nodes identified in the RIGHT axilla, also indicating a good response to interval treatment. 4. No MRI evidence of malignancy within the LEFT breast. RECOMMENDATION: 1. If breast conservation surgery is being considered for the RIGHT breast, MRI-guided biopsy is recommended for a new small (9 mm) focal non-mass enhancement within the outer RIGHT breast. Per previous reports, plan is for a RIGHT mastectomy. If so, no additional biopsy is needed. 2. Otherwise, per current treatment plan for patient's known biopsy-proven RIGHT breast invasive carcinoma. BI-RADS CATEGORY  4: Suspicious. Electronically Signed   By: Franki Cabot M.D.   On: 11/07/2020 13:27  ECHOCARDIOGRAM COMPLETE  Result Date: 11/04/2020    ECHOCARDIOGRAM REPORT   Patient  Name:   ITZELLE GAINS Date of Exam: 11/04/2020 Medical Rec #:  [id number]    Height:       66.0 in Accession #:    [id number]   Weight:       163.6 lb Date of Birth:  07/07/1985    BSA:          1.836 m Patient Age:    35 years     BP:           119/79 mmHg Patient Gender: F            HR:           99 bpm. Exam Location:  Outpatient Procedure: 2D Echo, Cardiac Doppler, Color Doppler and Strain Analysis Indications:    Chemo Z09  History:        Patient has prior history of Echocardiogram examinations, most                 recent 07/23/2020.  Sonographer:    Bernadene Person RDCS Referring Phys: [place]  1. Global longitudinal strain is -21.7%. Left ventricular ejection fraction, by estimation, is 65 to 70%. The left ventricle has normal function. The left ventricle has no regional wall motion abnormalities. Left ventricular diastolic parameters were normal.  2. Right ventricular systolic function is normal. The right ventricular size is normal.  3. The mitral valve is normal in structure. Trivial mitral valve regurgitation.  4. The aortic valve is normal in structure. Aortic valve regurgitation is not visualized.  5. The inferior vena cava is normal in size with greater than 50% respiratory variability, suggesting right atrial pressure of 3 mmHg. Comparison(s): The left ventricular function is unchanged. FINDINGS  Left Ventricle: Global longitudinal strain is -21.7%. Left ventricular ejection fraction, by estimation, is 65 to 70%.  The left ventricle has normal function. The left ventricle has no regional wall motion abnormalities. The left ventricular internal cavity size was normal in size. There is no left ventricular hypertrophy. Left ventricular diastolic parameters were normal. Right Ventricle: The right ventricular size is normal. Right vetricular wall thickness was not assessed. Right ventricular systolic function is normal. Left Atrium: Left atrial size was normal in size. Right  Atrium: Right atrial size was normal in size. Pericardium: There is no evidence of pericardial effusion. Mitral Valve: The mitral valve is normal in structure. Trivial mitral valve regurgitation. Tricuspid Valve: The tricuspid valve is normal in structure. Tricuspid valve regurgitation is trivial. Aortic Valve: The aortic valve is normal in structure. Aortic valve regurgitation is not visualized. Pulmonic Valve: The pulmonic valve was not well visualized. Pulmonic valve regurgitation is not visualized. No evidence of pulmonic stenosis. Aorta: The aortic root and ascending aorta are structurally normal, with no evidence of dilitation. Venous: The inferior vena cava is normal in size with greater than 50% respiratory variability, suggesting right atrial pressure of 3 mmHg. IAS/Shunts: No atrial level shunt detected by color flow Doppler.  LEFT VENTRICLE PLAX 2D LVIDd:         4.90 cm  Diastology LVIDs:         3.10 cm  LV e' medial:    9.73 cm/s LV PW:         0.90 cm  LV E/e' medial:  7.2 LV IVS:        0.70 cm  LV e' lateral:   10.70 cm/s LVOT diam:     1.90 cm  LV E/e' lateral: 6.5 LV SV:         48 LV SV Index:   26 LVOT Area:     2.84 cm  RIGHT VENTRICLE RV S prime:     13.10 cm/s TAPSE (M-mode): 1.8 cm LEFT ATRIUM             Index       RIGHT ATRIUM           Index LA diam:        2.70 cm 1.47 cm/m  RA Area:     10.70 cm LA Vol (A2C):   32.3 ml 17.59 ml/m RA Volume:   22.40 ml  12.20 ml/m LA Vol (A4C):   36.4 ml 19.82 ml/m LA Biplane Vol: 35.6 ml 19.39 ml/m  AORTIC VALVE LVOT Vmax:   104.00 cm/s LVOT Vmean:  69.400 cm/s LVOT VTI:    0.171 m  AORTA Ao Root diam: 3.20 cm Ao Asc diam:  2.80 cm MITRAL VALVE MV Area (PHT): 5.75 cm    SHUNTS MV Decel Time: 132 msec    Systemic VTI:  0.17 m MV E velocity: 69.80 cm/s  Systemic Diam: 1.90 cm MV A velocity: 66.40 cm/s MV E/A ratio:  1.05 Dorris Carnes MD Electronically signed by Dorris Carnes MD Signature Date/Time: 11/04/2020/10:06:15 PM    Final       ELIGIBLE FOR  AVAILABLE RESEARCH PROTOCOL:   ASSESSMENT: 35 y.o. [place] woman with a clinical mT2 N3, stage IIIA invasive ductal carcinoma, grade 1, triple positive  (a) breast MRI 07/06/2020 finds a 1.6 cm lower inner quadrant mass associated with 5.4 cm of non-mass-like enhancement, at least 8 enlarged right axillary lymph node and 1 right internal mammary lymph node  (b) CT scans of the neck and chest 06/24/2020 shows subcentimeter right cervical adenopathy, 2 right supraclavicular lymph nodes the larger 0.65 cm,  also left cervical adenopathy largest measuring 1.02 cm; there is also a 1.2 cm lesion in the left hepatic lobe, and a sclerotic lesion in the mid thoracic spine at T7  (c) PET scan 07/21/2020 shows regional adenopathy (including 2 right subcentimeter supraclavicular lymph nodes and subcentimeter lymph nodes in the right hilar, subcarinal and lateral aortic region), no uptake in liver, and an indeterminate T12 lesion  (d) thoracic spine MRI 07/27/2020 shows the T12 lesion to be likely benign, there is also degenerative disc disease  (1) status post total thyroidectomy and central compartment lymph node biopsy 04/12/2019 for a pT1a pN0 medullary thyroid carcinoma, with negative margins, no angioinvasion, and no extrathyroidal extension  (a) CEA and calcitonin   (2) genetics testing 10/27/2018 showed   (a) 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 21 days x 6,  07/23/2020-11/06/2020, followed by two cycles of Trastuzumab alone pre-surgery  (4) adjuvant T-DM1/Kadcyla every 21 days x 14 beginning 01/22/2021  (a) echo 07/22/2020 shows an ejection fraction  in the 60-65% range.  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  Margins were negative.  (i) a total of 15 right axillary lymph nodes were removed.  (6) adjuvant radiation  (7) antiestrogens   PLAN:  Joellen Jersey has recovered well from her recent surgery.  She is here for follow-up to discuss adjuvant treatment for her triple positive breast cancer.  She met with myself and Dr. Jana Hakim who discussed her pathology results in detail and the residual disease that was identified.  Overall this is a good response.    There is still room for improvement, therefore we will start TDM-1.  We explained that this is also called Kadcyla and it is a Herceptin molecule with chemotherapy attached.  The way it works is that Herceptin molecule will attached to the HER2 receptor and introduced chemotherapy into the HER2's positive cell.  In this way they are not as many side effects as you might experience with chemotherapy.  We discussed to start time for her treatment.  She will start this in 1 week as she continues to heal from her surgery.  Her most recent echo was completed before surgery on December 29, 2020 and was stable.  I reviewed her labs with her today which were normal.  Her c-Met is pending.  Her previous ionized calciums have been normal for the past 2 draws.  I recommended she continue with her activity level and heal.  She will return on November 17 to start [place].  She will then come back on December 8 for labs, follow-up with Dr. Jana Hakim, [facility].  She understands this and verbalizes agreement with this.  She is also scheduled to see radiation oncology on February 04, 2021.  Total encounter time: 30 minutes* in  face to face visit time, chart review, lab review, coordination of care, and documentation of the encounter.   Wilber Bihari, NP 11/27/20 11:08 AM Medical Oncology and Hematology [facility]  [place], [place] [address] Tel. [phone]    Fax. [phone]   ADDENDUM: Joellen Jersey is recovering well from her surgeries.  We discussed the pathology reports.  Even though we are calling the residual tumor T2, actually what were dealing with is very patchy residual disease over a fibrotic area of 5 cm.  There was 1 microscopic tumor deposit in the 15 lymph nodes involved.  Overall this is good response to treatment.  I believe she will benefit from intensifying anti-HER2 treatment and we are going to start a T-DM1 beginning later this month.  She also will benefit from adjuvant radiation and she already has an appointment with Dr. Lisbeth Renshaw to operationalize that.  I will see her with our APP on 02/16/2021 to assess tolerance of these new treatments and discuss any questions as she may have prior to her transition to a different oncologist         I personally saw this patient and performed a substantive portion of this encounter with the listed APP documented above.   Chauncey Cruel, MD Medical Oncology and Hematology [facility] [address] [place], [place] [address] Tel. [phone]    Fax. [phone]     *Total Encounter Time as defined by the Centers for Medicare and Medicaid Services includes, in addition to the face-to-face time of a patient visit (documented in the note above) non-face-to-face time: obtaining and reviewing outside history, ordering and reviewing medications, tests or procedures, care coordination (communications with other health care professionals or caregivers) and documentation in the medical record.

## 2021-01-16 ENCOUNTER — Encounter: Payer: Self-pay | Admitting: Oncology

## 2021-01-16 ENCOUNTER — Encounter: Payer: Self-pay | Admitting: Adult Health

## 2021-01-16 LAB — CALCIUM, IONIZED: Calcium, Ionized, Serum: 5 mg/dL (ref 4.5–5.6)

## 2021-01-19 ENCOUNTER — Encounter: Payer: Self-pay | Admitting: *Deleted

## 2021-01-19 NOTE — Progress Notes (Signed)
Pharmacist Chemotherapy Monitoring - Initial Assessment    Anticipated start date: 01/26/21   The following has been reviewed per standard work regarding the patient's treatment regimen: The patient's diagnosis, treatment plan and drug doses, and organ/hematologic function Lab orders and baseline tests specific to treatment regimen  The treatment plan start date, drug sequencing, and pre-medications Prior authorization status  Patient's documented medication list, including drug-drug interaction screen and prescriptions for anti-emetics and supportive care specific to the treatment regimen The drug concentrations, fluid compatibility, administration routes, and timing of the medications to be used The patient's access for treatment and lifetime cumulative dose history, if applicable  The patient's medication allergies and previous infusion related reactions, if applicable   Changes made to treatment plan:  treatment plan date Home premeds  Follow up needed:  Pending authorization for treatment    Judge Stall, [facility], 01/19/2021  11:15 AM

## 2021-01-22 NOTE — Progress Notes (Signed)
[place]  [phone] [phone]     ID: ANUPAMA PIEHL DOB: Dec 21, 1985  MR#: [id number]  YWV#:[id number]  Patient Care Team: [name] as PCP - General (Family Medicine) Magrinat, Virgie Dad, MD as Consulting Physician (Oncology) Kyung Rudd, MD as Consulting Physician (Radiation Oncology) Armandina Gemma, MD as Consulting Physician (General Surgery) Rockwell Germany, RN as Registered Nurse Mauro Kaufmann, RN as Registered Nurse Scot Dock, NP OTHER MD:  CHIEF COMPLAINT: Triple positive locally advanced breast cancer  CURRENT TREATMENT: Neoadjuvant chemotherapy   INTERVAL HISTORY: Kayla Little returns today for follow up and treatment of her locally advanced breast cancer.   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  Margins were negative.  Microscopic metastatic carcinoma was noted in 1 out of 15 lymph nodes biopsied.  Kayla Little is healing from her surgery well.  She notes that she does have some discomfort and slight swelling along the left lower lateral mastectomy site.  She has 2 drains in place that are draining about 30 cc a day of serosanguineous drainage.  She denies any new issues such as fever chills cough bowel bladder issues.  [name]'s most recent echocardiogram was completed on December 29, 2020 and it showed a left ventricular ejection fraction of 60 to 65%.    REVIEW OF SYSTEMS: Review of Systems  Constitutional:  Positive for fatigue. Negative for appetite change, chills, fever and unexpected weight change.  HENT:   Negative for hearing loss, lump/mass and trouble swallowing.   Eyes:  Negative for eye problems and icterus.  Respiratory:  Negative for chest tightness, cough and shortness of breath.   Cardiovascular:  Negative for chest  pain, leg swelling and palpitations.  Gastrointestinal:  Negative for abdominal distention, abdominal pain, constipation, diarrhea, nausea and vomiting.  Endocrine: Negative for hot flashes.  Genitourinary:  Negative for difficulty urinating.   Musculoskeletal:  Negative for arthralgias.  Skin:  Negative for itching and rash.  Neurological:  Negative for dizziness, extremity weakness, headaches and numbness.  Hematological:  Negative for adenopathy. Does not bruise/bleed easily.  Psychiatric/Behavioral:  Negative for depression. The patient is not nervous/anxious.      COVID 19 VACCINATION STATUS: Status post Moderna x2 followed by [employer] booster   HISTORY OF CURRENT ILLNESS: From the original intake note:  [name] (the daughter of my patient, Britnay Magnussen, whom I saw earlier today). has a history of medullary thyroid cancer, status post total thyroidectomy on 04/12/2019 under Dr. Harlow Asa.   More recently she presented to her GYN with a right supraclavicular lump, prompting neck and chest CT on 06/24/2020. Neck CT showed: no recurrent mass in thyroid bed, palpable abnormality corresponds to a cluster of 2 lymph nodes in right supraclavicular region; prominent lym ph nodes in neck bilaterally. Chest CT showed: enhancing 1.4 cm lesion in medial right breast with associated mildly enlarged right axillary lymph nodes; 1.2 cm lesion in left hepatic lobe with central hyperattenuation; enlargement of right infrahilar and right hilar nodal tissue is borderline suspicious; sclerotic lesion with spiculated margins at T7 level favored to represent a bone island.  She underwent bilateral diagnostic mammography with tomography and bilateral [facility]2020 showing: breast density category D; Right Breast 1. 2.6 cm irregular mass in inner breast, with associated microcalcifications spanning up to  5.1 cm, corresponding to chest CT finding. 2. Adjacent 0.9 cm mass in  inner breast, 2 cm away from dominant mass. 3. Two indeterminate masses in outer breast. 4. At least 3 enlarged lymph nodes in right axilla  Left Breast 1. Indeterminate 1.2 cm mass in lower-outer breast, possibly a fibroadenoma 2. No enlarged lymphadenopathy in left axilla.  Accordingly on 06/30/2020 she proceeded to biopsy of the right breast area in question. The pathology from this procedure ([id number]) showed:  1. Dominant mass  - invasive ductal carcinoma, grade 1  - ductal carcinoma in situ  - Prognostic indicators significant for: estrogen receptor, 90% positive and progesterone receptor, 80% positive, both with moderate staining intensity.  HER2 equivocal by immunohistochemistry (2+), but positive by FISH, with a signals ratio of 2.2 and the number per cell greater than 5.. 2. Smaller mass  - invasive ductal carcinoma, grade 1  - ductal carcinoma in situ  - Prognostic indicators significant for: estrogen receptor, 100% positive with strong staining intensity and progesterone receptor, 50% positive with moderate staining intensity.  HER2 positive by immunohistochemistry (3+). 3. Right axillary lymph node  - invasive ductal carcinoma, grade 1 Prognostic indicators significant for: estrogen receptor, 100% positive and progesterone receptor, 90% positive, both with strong staining intensity. HER2 equivocal by immunohistochemistry (2+), {but (positive or negative) by fluorescent in situ hybridization   Biopsy of the left breast area was performed the same day. Pathology from the procedure ([id number]) was benign.  She underwent breast MRI on 07/06/2020 showing: breast composition C; enhancing mass with adjacent non-mass enhancement in lower-inner right breast, which together measure 5.4 cm, corresponding with two biopsy-proven areas of malignancy; no other suspicious enhancement identified in right breast; one 4 mm right internal mammary lymph node with unclear significance; numerous abnormal  right axillary lymph nodes; no evidence of left breast malignancy.   Cancer Staging  Malignant neoplasm of upper-inner quadrant of right breast in female, estrogen receptor positive ([place]) Staging form: Breast, AJCC 8th Edition - Clinical stage from 07/08/2020: Stage IIIA (cT3, cN3, cM0, G1, ER+, PR+, HER2+) - Unsigned Stage prefix: Initial diagnosis Histologic grading system: 3 grade system - Pathologic stage from 01/07/2021: No Stage Recommended (ypT2, pN49m, cM0, G2) - Signed by CGardenia Phlegm NP on 01/15/2021 Stage prefix: Post-therapy Histologic grading system: 3 grade system  The patient's subsequent history is as detailed below.   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)    Hx of varicella    Kidney stone 2012   Medullary thyroid carcinoma ([facility]    Monoallelic mutation of CHEK2 gene in female patient    Postpartum care following vaginal delivery (7/27) 10/02/2014, June 16 , 2020   Second degree perineal laceration 08/23/2018   SVD (spontaneous vaginal delivery) 08/23/2018   Thyroid cancer ([facility] 2021    PAST SURGICAL HISTORY: Past Surgical History:  Procedure Laterality Date   IR IMAGING GUIDED PORT INSERTION  07/22/2020   MODIFIED MASTECTOMY Right 01/07/2021   Procedure: RIGHT MODIFIED RADICAL MASTECTOMY;  Surgeon: TJovita Kussmaul MD;  Location: [facility]  Service: General;  Laterality: Right;   NO PAST SURGERIES     SIMPLE MASTECTOMY WITH AXILLARY SENTINEL NODE BIOPSY Left 01/07/2021   Procedure: LEFT SIMPLE MASTECTOMY;  Surgeon: Jovita Kussmaul, MD;  Location: [facility];  Service: General;  Laterality:  Left;   THYROIDECTOMY N/A 04/12/2019   Procedure: TOTAL THYROIDECTOMY WITH LIMITED LYMPH NODE DISSECTION;  Surgeon: Armandina Gemma, MD;  Location: [facility];  Service: General;  Laterality: N/A;   UNILATERAL SALPINGECTOMY Right 07/09/2017   Procedure: RIGHT SALPINGECTOMY WITH REMOVAL OF ECTOPIC PREGNANCY;  Surgeon: Azucena Fallen, MD;  Location: [facility];  Service: Gynecology;  Laterality: Right;    FAMILY HISTORY: Family History  Problem Relation Age of Onset   Breast cancer Mother 32       2 types of cancer one side mastectomy with chemo   Endometriosis Mother    Endometriosis Maternal Grandmother    Breast cancer Maternal Grandmother        post menopausal   Diabetes Maternal Grandmother    Cancer Maternal Grandfather        prostate and lymphoma   Alzheimer's disease Paternal Grandmother    Cancer Father 72       prostate cancer   Aneurysm Paternal Grandfather        d. 53s   Please see genetic counseling note dated 11/23/2018 for full family information.   GYNECOLOGIC HISTORY:  No LMP recorded. (Menstrual status: IUD). Menarche: 35 years old Age at first live birth: 35 years old [place] P 2 Patient has a Mirena IUD in place HRT n/a  Hysterectomy? no BSO?  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  Her husband Barnabas Lister works for [employer].  Their children are Dylan 5 and Emma 2.  Barnabas Lister has a 81 year old daughter, the patient's stepdaughter Hildred Alamin, currently attending [employer].  The patient is a [employer]    ADVANCED DIRECTIVES: In the absence of any documents to the contrary the patient's husband is her healthcare power of attorney   HEALTH MAINTENANCE: Social History   Tobacco Use   Smoking status: Some Days    Packs/day: 0.50    Years: 5.00    Pack years: 2.50    Types: Cigarettes   Smokeless tobacco: Never   Tobacco comments:    Actively trying to quit.  Vaping Use    Vaping Use: Never used  Substance Use Topics   Alcohol use: Yes    Alcohol/week: 7.0 - 14.0 standard drinks    Types: 7 - 14 Glasses of wine per week    Comment: glass of wine each night    Drug use: Not Currently    Types: Marijuana    Comment: not during pregnancy, last use was 03/27/2019     Colonoscopy: n/a (age)  PAP:   Bone density: n/a (age)   No Known Allergies  Current Outpatient Medications  Medication Sig Dispense Refill   calcium carbonate (CALCIUM 600) 600 MG TABS tablet Take 1 tablet by mouth daily.     acetaminophen (TYLENOL) 325 MG tablet Take 2 tablets (650 mg total) by mouth every 4 (four) hours as needed (for pain scale < 4). 100 tablet 0   calcitRIOL (ROCALTROL) 0.5 MCG capsule Take 1 capsule (0.5 mcg total) by mouth 2 (two) times a week. 30 capsule 1   HYDROcodone-acetaminophen (NORCO/VICODIN) 5-325 MG tablet Take 1 tablet by mouth every 6 (six) hours as needed for moderate  pain. 15 tablet 0   levonorgestrel (MIRENA) 20 MCG/24HR IUD 1 each by Intrauterine route once.     levothyroxine (SYNTHROID) 175 MCG tablet Take 175 mcg by mouth daily before breakfast.     liothyronine (CYTOMEL) 5 MCG tablet Take 5 mcg by mouth daily.     loratadine (CLARITIN) 10 MG tablet Take 1 tablet (10 mg total) by mouth daily. 90 tablet 0   LORazepam (ATIVAN) 0.5 MG tablet Take 1 tablet (0.5 mg total) by mouth every 12 (twelve) hours as needed for anxiety. 30 tablet 0   methocarbamol (ROBAXIN) 500 MG tablet Take 1 tablet (500 mg total) by mouth every 6 (six) hours as needed for muscle spasms. 30 tablet 2   sertraline (ZOLOFT) 50 MG tablet Take 50 mg by mouth at bedtime.     VYVANSE 60 MG capsule Take 60 mg by mouth every morning.     No current facility-administered medications for this visit.   Facility-Administered Medications Ordered in Other Visits  Medication Dose Route Frequency Provider Last Rate Last Admin   sodium chloride flush (NS) 0.9 % injection 10 mL  10 mL Intravenous  PRN Magrinat, Virgie Dad, MD        OBJECTIVE:  Vitals:   01/23/21 0859  BP: 116/82  Pulse: 98  Resp: 16  Temp: 98.1 F (36.7 C)  SpO2: 100%      Body mass index is 26.41 kg/m.   Wt Readings from Last 3 Encounters:  01/23/21 163 lb 9.6 oz (74.2 kg)  01/15/21 168 lb 9.6 oz (76.5 kg)  01/07/21 166 lb 3.6 oz (75.4 kg)  ECOG FS:1 - Symptomatic but completely ambulatory GENERAL: Patient is a well appearing female in no acute distress HEENT:  Sclerae anicteric.  Mask in place. Neck is supple.  NODES:  No cervical, supraclavicular, or axillary lymphadenopathy palpated.  BREAST EXAM: Status post bilateral mastectomies.  She is wearing a bra and binder.  Underneath her mastectomy sites are healing well there is no erythema or swelling noted.  Right drain in place LUNGS:  Clear to auscultation bilaterally.  No wheezes or rhonchi. HEART:  Regular rate and rhythm. No murmur appreciated. ABDOMEN:  Soft, nontender.  Positive, normoactive bowel sounds. No organomegaly palpated. MSK:  No focal spinal tenderness to palpation. Full range of motion bilaterally in the upper extremities. EXTREMITIES:  No peripheral edema.   SKIN:  Clear with no obvious rashes or skin changes. No nail dyscrasia. NEURO:  Nonfocal. Well oriented.  Appropriate affect.    LAB RESULTS:  CMP     Component Value Date/Time   NA 136 01/23/2021 0844   NA 142 02/16/2017 1332   K 4.6 01/23/2021 0844   CL 104 01/23/2021 0844   CO2 20 (L) 01/23/2021 0844   GLUCOSE 101 (H) 01/23/2021 0844   BUN 8 01/23/2021 0844   BUN 9 02/16/2017 1332   CREATININE 0.82 01/23/2021 0844   CREATININE 0.73 12/18/2020 0946   CREATININE 0.82 12/04/2012 1732   CALCIUM 8.8 (L) 01/23/2021 0844   PROT 7.6 01/23/2021 0844   PROT 6.5 02/16/2017 1332   ALBUMIN 4.4 01/23/2021 0844   ALBUMIN 4.3 02/16/2017 1332   AST 31 01/23/2021 0844   AST 23 12/18/2020 0946   ALT 35 01/23/2021 0844   ALT 23 12/18/2020 0946   ALKPHOS 54 01/23/2021 0844    BILITOT 0.3 01/23/2021 0844   BILITOT 0.7 12/18/2020 0946   GFRNONAA >60 01/23/2021 0844   GFRNONAA >60 12/18/2020 0946   GFRAA >  60 04/15/2019 2145    No results found for: TOTALPROTELP, ALBUMINELP, A1GS, A2GS, BETS, BETA2SER, GAMS, MSPIKE, SPEI  Lab Results  Component Value Date   WBC 8.5 01/23/2021   NEUTROABS 4.8 01/23/2021   HGB 14.2 01/23/2021   HCT 42.2 01/23/2021   MCV 100.0 01/23/2021   PLT 416 (H) 01/23/2021    No results found for: LABCA2  No components found for: GOTLXB262  No results for input(s): INR in the last 168 hours.  No results found for: LABCA2  No results found for: CAN199  No results found for: CAN125  No results found for: MBT597  Lab Results  Component Value Date   CA2729 15.9 07/30/2020    No components found for: HGQUANT  Lab Results  Component Value Date   CEA1 5.17 (H) 07/30/2020   /  CEA ([facility])  Date Value Ref Range Status  07/30/2020 5.17 (H) 0.00 - 5.00 ng/mL Final    Comment:    (NOTE) This test was performed using [employer]'s Chemiluminescent Microparticle Immunoassay. Values obtained from different assay methods cannot be used interchangeably. Please note that 5-10% of patients who smoke may see CEA levels up to 6.9 ng/mL. Performed at [facility], [place] [address] [place], [place] [address]      No results found for: AFPTUMOR  No results found for: CHROMOGRNA  No results found for: KPAFRELGTCHN, LAMBDASER, KAPLAMBRATIO (kappa/lambda light chains)  No results found for: HGBA, HGBA2QUANT, HGBFQUANT, HGBSQUAN (Hemoglobinopathy evaluation)   No results found for: LDH  No results found for: IRON, TIBC, IRONPCTSAT (Iron and TIBC)  No results found for: FERRITIN  Urinalysis    Component Value Date/Time   COLORURINE RED (A) 10/18/2020 1107   APPEARANCEUR HAZY (A) 10/18/2020 1107   LABSPEC 1.009 10/18/2020 1107   PHURINE 8.0 10/18/2020 1107   GLUCOSEU NEGATIVE 10/18/2020 1107    HGBUR LARGE (A) 10/18/2020 1107   BILIRUBINUR NEGATIVE 10/18/2020 1107   BILIRUBINUR small (A) 02/16/2017 1216   BILIRUBINUR small 12/04/2012 [place] 10/18/2020 1107   PROTEINUR 30 (A) 10/18/2020 1107   UROBILINOGEN 0.2 02/16/2017 1216   UROBILINOGEN 0.2 09/18/2013 1555   NITRITE NEGATIVE 10/18/2020 1107   [place] 10/18/2020 1107    STUDIES: ECHOCARDIOGRAM COMPLETE  Result Date: 12/29/2020    ECHOCARDIOGRAM REPORT   Patient Name:   INGRI DIEMER Date of Exam: 12/29/2020 Medical Rec #:  [id number]    Height:       66.0 in Accession #:    [id number]   Weight:       170.8 lb Date of Birth:  June 15, 1985    BSA:          1.870 m Patient Age:    35 years     BP:           132/86 mmHg Patient Gender: F            HR:           97 bpm. Exam Location:  Outpatient Procedure: 2D Echo, 3D Echo, Cardiac Doppler, Color Doppler and Strain Analysis Indications:    Chemo Z09  History:        Patient has prior history of Echocardiogram examinations, most                 recent 11/04/2020.  Sonographer:    Darlina Sicilian RDCS Referring Phys: [place]  1. Left ventricular ejection fraction, by estimation, is 60 to  65%. The left ventricle has normal function. The left ventricle has no regional wall motion abnormalities. Left ventricular diastolic parameters were normal. The average left ventricular global longitudinal strain is -21.3 %.  2. Right ventricular systolic function is normal. The right ventricular size is normal.  3. The mitral valve is normal in structure. No evidence of mitral valve regurgitation. No evidence of mitral stenosis.  4. The aortic valve is normal in structure. Aortic valve regurgitation is not visualized. No aortic stenosis is present.  5. The inferior vena cava is normal in size with greater than 50% respiratory variability, suggesting right atrial pressure of 3 mmHg. FINDINGS  Left Ventricle: Left ventricular ejection fraction, by  estimation, is 60 to 65%. The left ventricle has normal function. The left ventricle has no regional wall motion abnormalities. The average left ventricular global longitudinal strain is -21.3 %. The left ventricular internal cavity size was normal in size. There is no left ventricular hypertrophy. Left ventricular diastolic parameters were normal. Right Ventricle: The right ventricular size is normal. No increase in right ventricular wall thickness. Right ventricular systolic function is normal. Left Atrium: Left atrial size was normal in size. Right Atrium: Right atrial size was normal in size. Pericardium: There is no evidence of pericardial effusion. Mitral Valve: The mitral valve is normal in structure. No evidence of mitral valve regurgitation. No evidence of mitral valve stenosis. Tricuspid Valve: The tricuspid valve is normal in structure. Tricuspid valve regurgitation is trivial. No evidence of tricuspid stenosis. Aortic Valve: The aortic valve is normal in structure. Aortic valve regurgitation is not visualized. No aortic stenosis is present. Pulmonic Valve: The pulmonic valve was normal in structure. Pulmonic valve regurgitation is not visualized. No evidence of pulmonic stenosis. Aorta: The aortic root is normal in size and structure. Venous: The inferior vena cava is normal in size with greater than 50% respiratory variability, suggesting right atrial pressure of 3 mmHg. IAS/Shunts: No atrial level shunt detected by color flow Doppler.  LEFT VENTRICLE PLAX 2D LVIDd:         5.10 cm LVIDs:         3.40 cm   2D Longitudinal Strain LV PW:         0.70 cm   2D Strain GLS Avg:     -21.3 % LV IVS:        0.70 cm LVOT diam:     2.10 cm LV SV:         67 LV SV Index:   36        3D Volume EF: LVOT Area:     3.46 cm  3D EF:        61 %                          LV EDV:       137 ml                          LV ESV:       54 ml                          LV SV:        83 ml RIGHT VENTRICLE RV S prime:     13.40 cm/s  TAPSE (M-mode): 2.6 cm LEFT ATRIUM             Index  RIGHT ATRIUM          Index LA diam:        3.20 cm 1.71 cm/m   RA Area:     8.00 cm LA Vol (A2C):   36.0 ml 19.25 ml/m  RA Volume:   13.30 ml 7.11 ml/m LA Vol (A4C):   33.2 ml 17.75 ml/m LA Biplane Vol: 34.7 ml 18.56 ml/m  AORTIC VALVE LVOT Vmax:   106.00 cm/s LVOT Vmean:  77.900 cm/s LVOT VTI:    0.192 m  AORTA Ao Root diam: 3.20 cm Ao Asc diam:  2.60 cm MITRAL VALVE MV Area (PHT): 4.02 cm    SHUNTS MV Decel Time: 189 msec    Systemic VTI:  0.19 m MV E velocity: 59.55 cm/s  Systemic Diam: 2.10 cm MV A velocity: 68.22 cm/s MV E/A ratio:  0.87 Glori Bickers MD Electronically signed by Glori Bickers MD Signature Date/Time: 12/29/2020/10:45:28 AM    Final       ELIGIBLE FOR AVAILABLE RESEARCH PROTOCOL:   ASSESSMENT: 35 y.o.  woman with a clinical mT2 N3, stage IIIA invasive ductal carcinoma, grade 1, triple positive  (a) breast MRI 07/06/2020 finds a 1.6 cm lower inner quadrant mass associated with 5.4 cm of non-mass-like enhancement, at least 8 enlarged right axillary lymph node and 1 right internal mammary lymph node  (b) CT scans of the neck and chest 06/24/2020 shows subcentimeter right cervical adenopathy, 2 right supraclavicular lymph nodes the larger 0.65 cm, also left cervical adenopathy largest measuring 1.02 cm; there is also a 1.2 cm lesion in the left hepatic lobe, and a sclerotic lesion in the mid thoracic spine at T7  (c) PET scan 07/21/2020 shows regional adenopathy (including 2 right subcentimeter supraclavicular lymph nodes and subcentimeter lymph nodes in the right hilar, subcarinal and lateral aortic region), no uptake in liver, and an indeterminate T12 lesion  (d) thoracic spine MRI 07/27/2020 shows the T12 lesion to be likely benign, there is also degenerative disc disease  (1) status post total thyroidectomy and central compartment lymph node biopsy 04/12/2019 for a pT1a pN0 medullary thyroid carcinoma,  with negative margins, no angioinvasion, and no extrathyroidal extension  (a) CEA and calcitonin   (2) genetics testing 10/27/2018 showed   (a) 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 21 days x 6,  07/23/2020-11/06/2020, followed by two cycles of Trastuzumab alone pre-surgery  (4) adjuvant T-DM1/Kadcyla every 21 days x 14 beginning 01/22/2021  (a) echo 07/22/2020 shows an ejection fraction in the 60-65% range.  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  Margins were negative.  (i) a total of 15 right axillary lymph nodes were removed.  (6) adjuvant radiation  (7) antiestrogens   PLAN:  Kayla Little is here today for follow-up of her breast cancer.  Her mastectomy sites continue to heal quite well.  She has her left drain that has been removed and her right drain is being removed today.  Kayla Little will return on Monday and start to receive TDM 1.  She will receive this every 3 weeks x 14 cycles.  Kayla Little also has her follow-up with radiation oncology on November 30.  She is going to talk to them about her radiation therapy and receive dates.  I anticipate that this will probably start in December because her mastectomy sites are healing quite well.  We will see Kayla Little back on December 12 for labs follow-up and her TDM 1.  I went ahead and sent some scheduling messages for  January.  She got to meet Dr. Payton Mccallum today who will be taking over her care when Dr. Dixie Dials.  Total encounter time: 30 minutes* in face to face visit time, chart review, lab review, coordination of care, and documentation of the encounter.   Wilber Bihari, NP 01/23/21 9:30 AM Medical Oncology and Hematology [facility] [place], [place] [address] Tel. [phone]    Fax. [phone]   *Total Encounter Time as defined by the Centers for Medicare and Medicaid Services includes, in addition to the face-to-face time of a patient visit (documented in the note above) non-face-to-face time: obtaining and reviewing outside history, ordering and reviewing medications, tests or procedures, care coordination (communications with other health care professionals or caregivers) and documentation in the medical record.

## 2021-01-23 ENCOUNTER — Other Ambulatory Visit: Payer: Self-pay | Admitting: Nurse Practitioner

## 2021-01-23 ENCOUNTER — Inpatient Hospital Stay: Payer: 59

## 2021-01-23 ENCOUNTER — Inpatient Hospital Stay (HOSPITAL_BASED_OUTPATIENT_CLINIC_OR_DEPARTMENT_OTHER): Payer: 59 | Admitting: Adult Health

## 2021-01-23 ENCOUNTER — Encounter: Payer: Self-pay | Admitting: Adult Health

## 2021-01-23 ENCOUNTER — Other Ambulatory Visit: Payer: Self-pay

## 2021-01-23 VITALS — BP 116/82 | HR 98 | Temp 98.1°F | Resp 16 | Ht 66.0 in | Wt 163.6 lb

## 2021-01-23 DIAGNOSIS — Z95828 Presence of other vascular implants and grafts: Secondary | ICD-10-CM

## 2021-01-23 DIAGNOSIS — F1911 Other psychoactive substance abuse, in remission: Secondary | ICD-10-CM | POA: Insufficient documentation

## 2021-01-23 DIAGNOSIS — E890 Postprocedural hypothyroidism: Secondary | ICD-10-CM | POA: Insufficient documentation

## 2021-01-23 DIAGNOSIS — F419 Anxiety disorder, unspecified: Secondary | ICD-10-CM | POA: Insufficient documentation

## 2021-01-23 DIAGNOSIS — E209 Hypoparathyroidism, unspecified: Secondary | ICD-10-CM | POA: Insufficient documentation

## 2021-01-23 DIAGNOSIS — Z85850 Personal history of malignant neoplasm of thyroid: Secondary | ICD-10-CM | POA: Insufficient documentation

## 2021-01-23 DIAGNOSIS — Z170 Estrogen receptor positive status [ER+]: Secondary | ICD-10-CM

## 2021-01-23 DIAGNOSIS — Z5112 Encounter for antineoplastic immunotherapy: Secondary | ICD-10-CM | POA: Diagnosis not present

## 2021-01-23 DIAGNOSIS — C50211 Malignant neoplasm of upper-inner quadrant of right female breast: Secondary | ICD-10-CM | POA: Diagnosis not present

## 2021-01-23 DIAGNOSIS — F988 Other specified behavioral and emotional disorders with onset usually occurring in childhood and adolescence: Secondary | ICD-10-CM | POA: Insufficient documentation

## 2021-01-23 LAB — COMPREHENSIVE METABOLIC PANEL
ALT: 35 U/L (ref 0–44)
AST: 31 U/L (ref 15–41)
Albumin: 4.4 g/dL (ref 3.5–5.0)
Alkaline Phosphatase: 54 U/L (ref 38–126)
Anion gap: 12 (ref 5–15)
BUN: 8 mg/dL (ref 6–20)
CO2: 20 mmol/L — ABNORMAL LOW (ref 22–32)
Calcium: 8.8 mg/dL — ABNORMAL LOW (ref 8.9–10.3)
Chloride: 104 mmol/L (ref 98–111)
Creatinine, Ser: 0.82 mg/dL (ref 0.44–1.00)
GFR, Estimated: >60 mL/min (ref >60)
Glucose, Bld: 101 mg/dL — ABNORMAL HIGH (ref 70–99)
Potassium: 4.6 mmol/L (ref 3.5–5.1)
Sodium: 136 mmol/L (ref 135–145)
Total Bilirubin: 0.3 mg/dL (ref 0.3–1.2)
Total Protein: 7.6 g/dL (ref 6.5–8.1)

## 2021-01-23 LAB — CBC WITH DIFFERENTIAL/PLATELET
Abs Immature Granulocytes: 0.02 10*3/uL (ref 0.00–0.07)
Basophils Absolute: 0.1 10*3/uL (ref 0.0–0.1)
Basophils Relative: 1 %
Eosinophils Absolute: 0.3 10*3/uL (ref 0.0–0.5)
Eosinophils Relative: 3 %
HCT: 42.2 % (ref 36.0–46.0)
Hemoglobin: 14.2 g/dL (ref 12.0–15.0)
Immature Granulocytes: 0 %
Lymphocytes Relative: 32 %
Lymphs Abs: 2.7 10*3/uL (ref 0.7–4.0)
MCH: 33.6 pg (ref 26.0–34.0)
MCHC: 33.6 g/dL (ref 30.0–36.0)
MCV: 100 fL (ref 80.0–100.0)
Monocytes Absolute: 0.7 10*3/uL (ref 0.1–1.0)
Monocytes Relative: 8 %
Neutro Abs: 4.8 10*3/uL (ref 1.7–7.7)
Neutrophils Relative %: 56 %
Platelets: 416 10*3/uL — ABNORMAL HIGH (ref 150–400)
RBC: 4.22 MIL/uL (ref 3.87–5.11)
RDW: 11.7 % (ref 11.5–15.5)
WBC: 8.5 10*3/uL (ref 4.0–10.5)
nRBC: 0 % (ref 0.0–0.2)

## 2021-01-23 MED ORDER — HEPARIN SOD (PORK) LOCK FLUSH 100 UNIT/ML IV SOLN
500.0000 [IU] | INTRAVENOUS | Status: AC | PRN
  Administered 2021-01-23: 500 [IU]

## 2021-01-23 MED ORDER — SODIUM CHLORIDE 0.9% FLUSH
10.0000 mL | INTRAVENOUS | Status: AC | PRN
  Administered 2021-01-23: 10 mL

## 2021-01-26 ENCOUNTER — Inpatient Hospital Stay: Payer: 59

## 2021-01-26 ENCOUNTER — Other Ambulatory Visit: Payer: Self-pay

## 2021-01-26 VITALS — BP 115/72 | HR 90 | Temp 98.5°F | Resp 18 | Wt 165.8 lb

## 2021-01-26 DIAGNOSIS — C50211 Malignant neoplasm of upper-inner quadrant of right female breast: Secondary | ICD-10-CM

## 2021-01-26 DIAGNOSIS — Z5112 Encounter for antineoplastic immunotherapy: Secondary | ICD-10-CM | POA: Diagnosis not present

## 2021-01-26 DIAGNOSIS — Z170 Estrogen receptor positive status [ER+]: Secondary | ICD-10-CM

## 2021-01-26 MED ORDER — SODIUM CHLORIDE 0.9% FLUSH
10.0000 mL | INTRAVENOUS | Status: DC | PRN
  Administered 2021-01-26: 10 mL

## 2021-01-26 MED ORDER — ACETAMINOPHEN 325 MG PO TABS
650.0000 mg | ORAL_TABLET | Freq: Once | ORAL | Status: AC
  Administered 2021-01-26: 650 mg via ORAL
  Filled 2021-01-26: qty 2

## 2021-01-26 MED ORDER — SODIUM CHLORIDE 0.9 % IV SOLN
3.4000 mg/kg | Freq: Once | INTRAVENOUS | Status: AC
  Administered 2021-01-26: 260 mg via INTRAVENOUS
  Filled 2021-01-26: qty 8

## 2021-01-26 MED ORDER — SODIUM CHLORIDE 0.9 % IV SOLN: Freq: Once | INTRAVENOUS | Status: AC

## 2021-01-26 MED ORDER — HEPARIN SOD (PORK) LOCK FLUSH 100 UNIT/ML IV SOLN
500.0000 [IU] | Freq: Once | INTRAVENOUS | Status: AC | PRN
  Administered 2021-01-26: 500 [IU]

## 2021-01-26 MED ORDER — DIPHENHYDRAMINE HCL 25 MG PO CAPS
25.0000 mg | ORAL_CAPSULE | Freq: Once | ORAL | Status: AC
  Administered 2021-01-26: 25 mg via ORAL
  Filled 2021-01-26: qty 1

## 2021-01-26 NOTE — Patient Instructions (Signed)
[facility]  Discharge Instructions: °Thank you for choosing [facility] to provide your oncology and hematology care.  ° °If you have a lab appointment with the [facility], please go directly to the [facility] and check in at the registration area. °  °Wear comfortable clothing and clothing appropriate for easy access to any Portacath or PICC line.  ° °We strive to give you quality time with your provider. You may need to reschedule your appointment if you arrive late (15 or more minutes).  Arriving late affects you and other patients whose appointments are after yours.  Also, if you miss three or more appointments without notifying the office, you may be dismissed from the clinic at the provider’s discretion.    °  °For prescription refill requests, have your pharmacy contact our office and allow 72 hours for refills to be completed.   ° °Today you received the following chemotherapy and/or immunotherapy agents Kadcyla    °  °To help prevent nausea and vomiting after your treatment, we encourage you to take your nausea medication as directed. ° °BELOW ARE SYMPTOMS THAT SHOULD BE REPORTED IMMEDIATELY: °*FEVER GREATER THAN 100.4 F (38 °C) OR HIGHER °*CHILLS OR SWEATING °*NAUSEA AND VOMITING THAT IS NOT CONTROLLED WITH YOUR NAUSEA MEDICATION °*UNUSUAL SHORTNESS OF BREATH °*UNUSUAL BRUISING OR BLEEDING °*URINARY PROBLEMS (pain or burning when urinating, or frequent urination) °*BOWEL PROBLEMS (unusual diarrhea, constipation, pain near the anus) °TENDERNESS IN MOUTH AND THROAT WITH OR WITHOUT PRESENCE OF ULCERS (sore throat, sores in mouth, or a toothache) °UNUSUAL RASH, SWELLING OR PAIN  °UNUSUAL VAGINAL DISCHARGE OR ITCHING  ° °Items with * indicate a potential emergency and should be followed up as soon as possible or go to the Emergency Department if any problems should occur. ° °Please show the CHEMOTHERAPY ALERT CARD or IMMUNOTHERAPY ALERT CARD at check-in to the  Emergency Department and triage nurse. ° °Should you have questions after your visit or need to cancel or reschedule your appointment, please contact  [facility]  Dept: [phone]  and follow the prompts.  Office hours are 8:00 a.m. to 4:30 p.m. Monday - Friday. Please note that voicemails left after 4:00 p.m. may not be returned until the following business day.  We are closed weekends and major holidays. You have access to a nurse at all times for urgent questions. Please call the main number to the clinic Dept: [phone] and follow the prompts. ° ° °For any non-urgent questions, you may also contact your provider using MyChart. We now offer e-Visits for anyone 18 and older to request care online for non-urgent symptoms. For details visit [email or web address]. °  °Also download the MyChart app! Go to the app store, search "MyChart", open the app, select , and log in with your MyChart username and password. ° °Due to Covid, a mask is required upon entering the hospital/clinic. If you do not have a mask, one will be given to you upon arrival. For doctor visits, patients may have 1 support person aged 18 or older with them. For treatment visits, patients cannot have anyone with them due to current Covid guidelines and our immunocompromised population.  ° °

## 2021-02-04 ENCOUNTER — Ambulatory Visit
Admission: RE | Admit: 2021-02-04 | Discharge: 2021-02-04 | Disposition: A | Discharge status: Home / Self Care | Payer: 59 | Source: Ambulatory Visit | Attending: Radiation Oncology | Admitting: Radiation Oncology

## 2021-02-04 ENCOUNTER — Encounter: Payer: Self-pay | Admitting: Radiation Oncology

## 2021-02-04 ENCOUNTER — Telehealth: Payer: Self-pay

## 2021-02-04 ENCOUNTER — Other Ambulatory Visit: Payer: Self-pay

## 2021-02-04 VITALS — BP 125/91 | HR 117 | Temp 97.8°F | Resp 20 | Ht 66.0 in | Wt 161.2 lb

## 2021-02-04 DIAGNOSIS — N6331 Unspecified lump in axillary tail of the right breast: Secondary | ICD-10-CM | POA: Diagnosis not present

## 2021-02-04 DIAGNOSIS — Z5111 Encounter for antineoplastic chemotherapy: Secondary | ICD-10-CM | POA: Insufficient documentation

## 2021-02-04 DIAGNOSIS — Z170 Estrogen receptor positive status [ER+]: Secondary | ICD-10-CM

## 2021-02-04 DIAGNOSIS — Z510 Encounter for antineoplastic radiation therapy: Secondary | ICD-10-CM | POA: Diagnosis present

## 2021-02-04 DIAGNOSIS — C50211 Malignant neoplasm of upper-inner quadrant of right female breast: Secondary | ICD-10-CM | POA: Diagnosis present

## 2021-02-04 DIAGNOSIS — Z806 Family history of leukemia: Secondary | ICD-10-CM | POA: Insufficient documentation

## 2021-02-04 DIAGNOSIS — Z1501 Genetic susceptibility to malignant neoplasm of breast: Secondary | ICD-10-CM | POA: Insufficient documentation

## 2021-02-04 DIAGNOSIS — Z8042 Family history of malignant neoplasm of prostate: Secondary | ICD-10-CM | POA: Insufficient documentation

## 2021-02-04 DIAGNOSIS — Z8041 Family history of malignant neoplasm of ovary: Secondary | ICD-10-CM | POA: Insufficient documentation

## 2021-02-04 DIAGNOSIS — Z79899 Other long term (current) drug therapy: Secondary | ICD-10-CM | POA: Insufficient documentation

## 2021-02-04 DIAGNOSIS — Z803 Family history of malignant neoplasm of breast: Secondary | ICD-10-CM | POA: Insufficient documentation

## 2021-02-04 DIAGNOSIS — F17210 Nicotine dependence, cigarettes, uncomplicated: Secondary | ICD-10-CM | POA: Diagnosis not present

## 2021-02-04 DIAGNOSIS — Z85850 Personal history of malignant neoplasm of thyroid: Secondary | ICD-10-CM | POA: Diagnosis not present

## 2021-02-04 DIAGNOSIS — Z1502 Genetic susceptibility to malignant neoplasm of ovary: Secondary | ICD-10-CM | POA: Insufficient documentation

## 2021-02-04 NOTE — Progress Notes (Signed)
Patient reports mild tenderness in center chest and some occasional insomnia. No other symptoms reported at this time.  Meaningful use complete.  Patient states "She is on the Mirena IUD birth control device, has not had a menses in over a year, had a negative pregnancy test on January 15, 2021, and that theres no chances of pregnancy."  BP (!) 125/91 (BP Location: Right Arm, Patient Position: Sitting, Cuff Size: Normal)   Pulse (!) 117   Temp 97.8 F (36.6 C)   Resp 20   Ht 5\' 6"  (1.676 m)   Wt 161 lb 3.2 oz (73.1 kg)   SpO2 100%   BMI 26.02 kg/m

## 2021-02-04 NOTE — Telephone Encounter (Signed)
Called patient to inquire about any chances of pregnancy. Verified patient w/ 2 forms of identification. Patient states "She is on the Mirena IUD birth control device, has not had a menses in over a year, had a negative pregnancy test on January 15, 2021, and that theres no chances of pregnancy." I advised patient that if she thinks there could be any chance of pregnancy that she may take an "at-home" pregnancy test and deliver [place] the results. Patient states "That will not be necessary." Told patient that if anything changes to let us know right away. Patient verbalized understanding of the information given.

## 2021-02-04 NOTE — Progress Notes (Signed)
Radiation Oncology         [phone] ________________________________  Name: [facility]        MRN: [id number]  Date of Service: 02/04/2021 DOB: [birth year]  WU:[id number], [facility]    REFERRING PHYSICIAN: Dr. Marlou Starks  DIAGNOSIS: The encounter diagnosis was Malignant neoplasm of upper-inner quadrant of right breast in female, estrogen receptor positive ([place]).   HISTORY OF PRESENT ILLNESS: [facility] is a 35 y.o. female seen with a diagnosis of right breast cancer. The patient presented with a palpable mass in the right breast.  The patient presented for further evaluation and outside imaging revealed an irregular mass in the inner right breast measuring up to 2.6 cm with associated microcalcifications spanning up to 5.1 cm.  There was a 9 mm lesion in the inner right breast, and 2 indeterminate masses in the outer breast totaling 4 sites.  There were at least 3 abnormal appearing lymph nodes in her right axilla.  There was also an ultrasound performed of the left breast showing a 1.3 cm mass in the lower outer left breast possibly a fibroadenoma and no evidence of adenopathy in the left axilla.  She underwent biopsies on 06/30/2020, her left breast biopsy was benign showing fibrocystic change, her right breast dominant lesion was a grade 1 invasive ductal carcinoma, her second right breast biopsy the smaller more medial lesion 4 cm from the nipple was also a grade 1 invasive ductal carcinoma, and an axillary lymph node was also positive and consistent with grade 1 invasive ductal carcinoma.  The tumors were ER/PR positive HER2 was amplified, and an MRI on 07/06/2020 showed the known right breast mass and associated nonmass enhancement measuring 5.4 cm in total. Numerous enlarged nodes in the right axilla, at least 8 in total and one internal mammary node were seen on the right. The left breast and axilla were negative for findings.   Since her last visit, she started systemic  chemotherapy on 07/23/20 and completed the chemotherapy portion on 11/06/20. Her post treatment MRI on 11/07/20 showed  new focal non mass enhancement  within the lower outer quadrant of the right breast measuring 73m, and there was also persistent non mass enhancement within the right breast but improvement since her prior MRI and no enlarged axillary nodes were noted. No left breast abnormalities were noted. She subsequently underwent a right MRM and left simple mastectomy on 01/07/21. Final pathology showed fibrocystic change and benign intramammary node in the left mastectomy specimen without carcinoma. 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 She continues on [place] and started Kadcyla on 01/26/21. She plans to delay reconstruction for at least a year. She's seen today to discuss adjuvant radiotherapy to the right chest wall and regional nodes.     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)  Hx of varicella    Kidney stone 2012   Medullary thyroid carcinoma ([place])    Monoallelic mutation of CHEK2 gene in female patient    Postpartum care following vaginal delivery (7/27) 10/02/2014, June 16 , 2020   Second degree perineal laceration 08/23/2018   SVD (spontaneous vaginal delivery) 08/23/2018   Thyroid cancer ([place]) 2021       PAST SURGICAL HISTORY: Past Surgical History:  Procedure Laterality Date   IR IMAGING GUIDED PORT  INSERTION  07/22/2020   MODIFIED MASTECTOMY Right 01/07/2021   Procedure: RIGHT MODIFIED RADICAL MASTECTOMY;  Surgeon: Jovita Kussmaul, MD;  Location: [facility];  Service: General;  Laterality: Right;   NO PAST SURGERIES     SIMPLE MASTECTOMY WITH AXILLARY SENTINEL NODE BIOPSY Left 01/07/2021   Procedure: LEFT SIMPLE MASTECTOMY;  Surgeon: Jovita Kussmaul, MD;  Location: [facility];  Service: General;  Laterality: Left;   THYROIDECTOMY N/A 04/12/2019   Procedure: TOTAL THYROIDECTOMY WITH LIMITED LYMPH NODE DISSECTION;  Surgeon: Armandina Gemma, MD;  Location: [facility];  Service: General;  Laterality: N/A;   UNILATERAL SALPINGECTOMY Right 07/09/2017   Procedure: RIGHT SALPINGECTOMY WITH REMOVAL OF ECTOPIC PREGNANCY;  Surgeon: Azucena Fallen, MD;  Location: [facility];  Service: Gynecology;  Laterality: Right;     FAMILY HISTORY:  Family History  Problem Relation Age of Onset   Breast cancer Mother 62       2 types of cancer one side mastectomy with chemo   Endometriosis Mother    Endometriosis Maternal Grandmother    Breast cancer Maternal Grandmother        post menopausal   Diabetes Maternal Grandmother    Cancer Maternal Grandfather        prostate and lymphoma   Alzheimer's disease Paternal Grandmother    Cancer Father 109       prostate cancer   Aneurysm Paternal Grandfather        d. 61s     SOCIAL HISTORY:  reports that she has been smoking cigarettes. She has a 2.50 pack-year smoking history. She has never used smokeless tobacco. She reports current alcohol use of about 7.0 - 14.0 standard drinks per week. She reports that she does not currently use drugs after having used the following drugs: Marijuana. The patient is married and lives in [place]. She has young children at home, and though she did go to school for nursing, she works in an [employer] firm.    ALLERGIES: Patient has no known allergies.   MEDICATIONS:  Current Outpatient Medications   Medication Sig Dispense Refill   acetaminophen (TYLENOL) 325 MG tablet Take 2 tablets (650 mg total) by mouth every 4 (four) hours as needed (for pain scale < 4). 100 tablet 0   calcitRIOL (ROCALTROL) 0.5 MCG capsule Take 1 capsule (0.5 mcg total) by mouth 2 (two) times a week. 30 capsule 1   calcium carbonate (CALCIUM 600) 600 MG TABS tablet Take 1 tablet by mouth daily.     HYDROcodone-acetaminophen (NORCO/VICODIN) 5-325 MG tablet Take 1 tablet by mouth every 6 (six) hours as needed for moderate pain. 15 tablet 0   levonorgestrel (MIRENA) 20 MCG/24HR IUD 1 each by Intrauterine route once.     levothyroxine (SYNTHROID) 175 MCG tablet Take 175 mcg by mouth daily before breakfast.     liothyronine (CYTOMEL) 5 MCG tablet Take 5 mcg by mouth daily.     loratadine (CLARITIN) 10 MG tablet Take 1 tablet (10 mg total) by  mouth daily. 90 tablet 0   LORazepam (ATIVAN) 0.5 MG tablet Take 1 tablet (0.5 mg total) by mouth every 12 (twelve) hours as needed for anxiety. 30 tablet 0   methocarbamol (ROBAXIN) 500 MG tablet Take 1 tablet (500 mg total) by mouth every 6 (six) hours as needed for muscle spasms. 30 tablet 2   sertraline (ZOLOFT) 50 MG tablet Take 50 mg by mouth at bedtime.     VYVANSE 60 MG capsule Take 60 mg by mouth every morning.     No current facility-administered medications for this encounter.   Facility-Administered Medications Ordered in Other Encounters  Medication Dose Route Frequency Provider Last Rate Last Admin   sodium chloride flush (NS) 0.9 % injection 10 mL  10 mL Intravenous PRN Magrinat, Virgie Dad, MD         REVIEW OF SYSTEMS: On review of systems, the patient reports that she is doing well overall. She is having some tightness in her right axilla with ROM. She has been stretching and doing exercises to try to help this and sees PT tomorrow. She has had numbness in the axilla and chest wall on the right. She otherwise has been doing well, but has had some stress with her  teenager at home and describes this as the reason for her elevated BP. No other complaints are noted.   PHYSICAL EXAM:  Wt Readings from Last 3 Encounters:  02/04/21 161 lb 3.2 oz (73.1 kg)  01/26/21 165 lb 12 oz (75.2 kg)  01/23/21 163 lb 9.6 oz (74.2 kg)   Temp Readings from Last 3 Encounters:  02/04/21 97.8 F (36.6 C)  01/26/21 98.5 F (36.9 C) (Oral)  01/23/21 98.1 F (36.7 C) (Temporal)   BP Readings from Last 3 Encounters:  02/04/21 (!) 125/91  01/26/21 115/72  01/23/21 116/82   Pulse Readings from Last 3 Encounters:  02/04/21 (!) 117  01/26/21 90  01/23/21 98    In general this is a well appearing caucasian female in no acute distress. She's alert and oriented x4 and appropriate throughout the examination. Cardiopulmonary assessment is negative for acute distress and she exhibits normal effort. Bilateral mastectomy scars are well healed without erythema, separation or drainage. There is thin palpable cording in the medial biceps region of the right arm this is tender to palpation.    ECOG = 1  0 - Asymptomatic (Fully active, able to carry on all predisease activities without restriction)  1 - Symptomatic but completely ambulatory (Restricted in physically strenuous activity but ambulatory and able to carry out work of a light or sedentary nature. For example, light housework, office work)  2 - Symptomatic, <50% in bed during the day (Ambulatory and capable of all self care but unable to carry out any work activities. Up and about more than 50% of waking hours)  3 - Symptomatic, >50% in bed, but not bedbound (Capable of only limited self-care, confined to bed or chair 50% or more of waking hours)  4 - Bedbound (Completely disabled. Cannot carry on any self-care. Totally confined to bed or chair)  5 - Death   Eustace Pen MM, Creech RH, Tormey DC, et al. [phone]). "Toxicity and response criteria of the [facility]". [place] Oncol. 5 (6):  649-55    LABORATORY DATA:  Lab Results  Component Value Date   WBC 8.5 01/23/2021   HGB 14.2 01/23/2021   HCT 42.2 01/23/2021   MCV 100.0 01/23/2021   PLT 416 (H) 01/23/2021  Lab Results  Component Value Date   NA 136 01/23/2021   K 4.6 01/23/2021   CL 104 01/23/2021   CO2 20 (L) 01/23/2021   Lab Results  Component Value Date   ALT 35 01/23/2021   AST 31 01/23/2021   ALKPHOS 54 01/23/2021   BILITOT 0.3 01/23/2021      RADIOGRAPHY: No results found.      IMPRESSION/PLAN: 1. Stage IIIA, cT3N3M0 grade 1, triple positive invasive ductal carcinoma of the right breast. Dr. Lisbeth Renshaw discusses the patient's course since her last visit, reviews her final pathology findings and  the nature of locally advanced breast disease. She has done well since surgery and is ready to proceed with external radiotherapy to the right chest wall as well as to the regional nodes  to reduce risks of local recurrence followed by antiestrogen therapy. We discussed the risks, benefits, short, and long term effects of radiotherapy, as well as the curative intent, and the patient is interested in proceeding. Dr. Lisbeth Renshaw discusses the delivery and logistics of radiotherapy and anticipates a course of 6 1/2 weeks of radiotherapy to the regional nodes and right chest wall. Written consent is obtained and placed in the chart, a copy was provided to the patient.  2.  Contraceptive Counseling. The patient has a Mirena IUD but has not been sexually active since surgery and had negative pregnancy testing prior to surgery. She is aware of the recommendation for avoiding pregnancy during radiation and will let me know if she needs guidance on methods. No pregnancy testing is needed prior to simulation. 3. RUE Cording. I will message PT so they're aware of her exam findings but hopefully this won't limit planning with simulation today.    In a visit lasting 45 minutes, greater than 50% of the time was spent face to face  reviewing her case, as well as in preparation of, discussing, and coordinating the patient's care.  The above documentation reflects my direct findings during this shared patient visit. Please see the separate note by Dr. Lisbeth Renshaw on this date for the remainder of the patient's plan of care.    Carola Rhine, [facility]    **Disclaimer: This note was dictated with voice recognition software. Similar sounding words can inadvertently be transcribed and this note may contain transcription errors which may not have been corrected upon publication of note.**

## 2021-02-05 ENCOUNTER — Ambulatory Visit: Payer: 59 | Admitting: Physical Therapy

## 2021-02-05 ENCOUNTER — Encounter: Payer: Self-pay | Admitting: Physical Therapy

## 2021-02-05 DIAGNOSIS — C50211 Malignant neoplasm of upper-inner quadrant of right female breast: Secondary | ICD-10-CM | POA: Diagnosis present

## 2021-02-05 DIAGNOSIS — Z483 Aftercare following surgery for neoplasm: Secondary | ICD-10-CM

## 2021-02-05 DIAGNOSIS — Z170 Estrogen receptor positive status [ER+]: Secondary | ICD-10-CM | POA: Insufficient documentation

## 2021-02-05 DIAGNOSIS — C50311 Malignant neoplasm of lower-inner quadrant of right female breast: Secondary | ICD-10-CM | POA: Insufficient documentation

## 2021-02-05 DIAGNOSIS — M25611 Stiffness of right shoulder, not elsewhere classified: Secondary | ICD-10-CM

## 2021-02-05 DIAGNOSIS — M25612 Stiffness of left shoulder, not elsewhere classified: Secondary | ICD-10-CM | POA: Insufficient documentation

## 2021-02-05 DIAGNOSIS — R293 Abnormal posture: Secondary | ICD-10-CM | POA: Insufficient documentation

## 2021-02-05 NOTE — Patient Instructions (Addendum)
     [facility]  [address]  [place] [address]  [phone]  After Breast Cancer Class It is recommended you attend the ABC class to be educated on lymphedema risk reduction. This class is free of charge and lasts for 1 hour. It is a 1-time class. You will need to download the Webex app either on your phone or computer. We will send you a link the night before or the morning of the class. You should be able to click on that link to join the class. This is not a confidential class. You don't have to turn your camera on, but other participants may be able to see your email address. You are scheduled for January 16th at 11:00.  Scar massage You can begin gentle scar massage to you incision sites. Gently place one hand on the incision and move the skin (without sliding on the skin) in various directions. Do this for a few minutes and then you can gently massage either coconut oil or vitamin E cream into the scars.  Compression garment You should continue wearing your compression bra until you feel like you no longer have swelling.  Home exercise Program Continue doing the exercises you were given until you feel like you can do them without feeling any tightness at the end.   Walking Program Studies show that 30 minutes of walking per day (fast enough to elevate your heart rate) can significantly reduce the risk of a cancer recurrence. If you can't walk due to other medical reasons, we encourage you to find another activity you could do (like a stationary bike or water exercise).  Posture After breast cancer surgery, people frequently sit with rounded shoulders posture because it puts their incisions on slack and feels better. If you sit like this and scar tissue forms in that position, you can become very tight and have pain sitting or standing with good posture. Try to be aware of your posture and sit and stand up tall to heal properly.  Follow up PT: It  is recommended you return every 3 months for the first 3 years following surgery to be assessed on the SOZO machine for an L-Dex score. This helps prevent clinically significant lymphedema in 95% of patients. These follow up screens are 10 minute appointments that you are not billed for. You are scheduled for February 13th 2023 at 3:40.

## 2021-02-05 NOTE — Therapy (Signed)
[facility] @ [address] [place], [place], [address] Phone: [phone]   Fax:  [phone]  Physical Therapy Treatment  Patient Details  Name: Kayla Little MRN: [id number] Date of Birth: Jan 14, 1986 Referring Provider (PT): Dr. Autumn Messing   Encounter Date: 02/05/2021   PT End of Session - 02/05/21 1222     Visit Number 2    Number of Visits 10    Date for PT Re-Evaluation 03/05/21    PT Start Time 1115    PT Stop Time 1205    PT Time Calculation (min) 50 min    Activity Tolerance Patient tolerated treatment well    Behavior During Therapy [facility] for tasks assessed/performed             Past Medical History:  Diagnosis Date   ADD (attention deficit disorder)    Breast cancer ([place]) 06/30/2020   Breast lump    R breast   Depression    Family history of breast cancer    Family history of lymphoma    Family history of ovarian cancer    Family history of prostate cancer    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)    Hx of varicella    Kidney stone 2012   Medullary thyroid carcinoma ([place])    Monoallelic mutation of CHEK2 gene in female patient    Postpartum care following vaginal delivery (7/27) 10/02/2014, June 16 , 2020   Second degree perineal laceration 08/23/2018   SVD (spontaneous vaginal delivery) 08/23/2018   Thyroid cancer ([place]) 2021    Past Surgical History:  Procedure Laterality Date   IR IMAGING GUIDED PORT INSERTION  07/22/2020   MODIFIED MASTECTOMY Right 01/07/2021   Procedure: RIGHT MODIFIED RADICAL MASTECTOMY;  Surgeon: Jovita Kussmaul, MD;  Location: [facility];  Service: General;  Laterality: Right;   NO PAST SURGERIES     SIMPLE MASTECTOMY WITH AXILLARY SENTINEL NODE BIOPSY Left 01/07/2021   Procedure: LEFT SIMPLE MASTECTOMY;  Surgeon: Jovita Kussmaul, MD;  Location: [facility];  Service: General;  Laterality: Left;   THYROIDECTOMY N/A 04/12/2019    Procedure: TOTAL THYROIDECTOMY WITH LIMITED LYMPH NODE DISSECTION;  Surgeon: Armandina Gemma, MD;  [facility];  Service: General;  Laterality: N/A;   UNILATERAL SALPINGECTOMY Right 07/09/2017   Procedure: RIGHT SALPINGECTOMY WITH REMOVAL OF ECTOPIC PREGNANCY;  Surgeon: Azucena Fallen, MD;  Location: [facility];  Service: Gynecology;  Laterality: Right;    There were no vitals filed for this visit.   Subjective Assessment - 02/05/21 1120     Subjective Patient reports she underwent neoadjuvant chemotherapy from 07/23/2020-11/06/2020 followed by a bilateral mastectomy with a right axillary lymph node dissection (1/15 nodes positive). She had radiation simulation yesterday and will start radiation soon followed by anti-estrogen therapy.    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 She underwent neoadjuvant chemotherapy from 07/23/2020-11/06/2020 followed by a bilateral mastectomy with a right axillary lymph node dissection (1/15 nodes positive). She has a hx of medullary thyroid cancer with a thyroidectomy 04/12/2019. She smokes cigarettes infrequently and drinks red wine daily with varying amounts 0-3 glasses.    Patient Stated Goals Get my arms ack to normal    Currently in Pain? Yes    Pain Score 2     Pain Location Axilla    Pain Orientation Right    Pain  Descriptors / Indicators Tightness    Pain Type Surgical pain    Pain Onset 1 to 4 weeks ago    Pain Frequency Intermittent    Aggravating Factors  Reaching up    Pain Relieving Factors Resting                [facility] PT Assessment - 02/05/21 0001       Assessment   Medical Diagnosis s/p bil mastectomy and right ALND    Referring Provider (PT) Dr. Autumn Messing    Onset Date/Surgical Date 01/07/21    Hand Dominance Right    Prior Therapy None      Precautions   Precautions Other (comment)    Precaution Comments recent surgery and right arm  lymphedema risk      Restrictions   Weight Bearing Restrictions No      Balance Screen   Has the patient fallen in the past 6 months No    Has the patient had a decrease in activity level because of a fear of falling?  No    Is the patient reluctant to leave their home because of a fear of falling?  No      Home Social worker Private residence    Living Arrangements Spouse/significant other;Children   Husband, 2 and 51 y.o. 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 Scar tissue tightness present. Palpable and visible axillary cording with multiple thin cords present.      Posture/Postural Control   Posture/Postural Control Postural limitations    Postural Limitations Rounded Shoulders;Forward head      ROM / Strength   AROM / PROM / Strength AROM;Strength      AROM   AROM Assessment Site Shoulder    Right/Left Shoulder Right;Left    Right Shoulder Extension 45 Degrees    Right Shoulder Flexion 128 Degrees   134 after manual tx   Right Shoulder ABduction 112 Degrees   132 after manual tx   Right Shoulder Internal Rotation 78 Degrees    Right Shoulder External Rotation 83 Degrees    Left Shoulder Extension 47 Degrees    Left Shoulder Flexion 152 Degrees    Left Shoulder ABduction 157 Degrees    Left Shoulder Internal Rotation 77 Degrees    Left Shoulder External Rotation 82 Degrees      Strength   Overall Strength Unable to assess;Due to precautions               LYMPHEDEMA/ONCOLOGY QUESTIONNAIRE - 02/05/21 0001       Type   Cancer Type Right breast cancer       Surgeries   Mastectomy Date 01/07/21    Axillary Lymph Node Dissection Date 01/07/21    Number Lymph Nodes Removed 15      Treatment   Active Chemotherapy Treatment No    Past Chemotherapy Treatment Yes    Date 11/06/20    Active Radiation Treatment No    Past Radiation Treatment No    Current Hormone Treatment No  Past Hormone Therapy No      What other symptoms do you have   Are you Having Heaviness or Tightness Yes    Are you having Pain Yes    Are you having pitting edema No    Is it Hard or Difficult finding clothes that fit No    Do you have infections No    Is there Decreased scar mobility Yes    [place] Sign No      Lymphedema Assessments   Lymphedema Assessments Upper extremities      Right Upper Extremity Lymphedema   10 cm Proximal to Olecranon Process 29.7 cm    Olecranon Process 24.5 cm    10 cm Proximal to Ulnar Styloid Process 23.4 cm    Just Proximal to Ulnar Styloid Process 16.2 cm    Across Hand at [employer] 20.4 cm    At [place] of 2nd Digit 6.4 cm      Left Upper Extremity Lymphedema   10 cm Proximal to Olecranon Process 30 cm    Olecranon Process 24.1 cm    10 cm Proximal to Ulnar Styloid Process 21.8 cm    Just Proximal to Ulnar Styloid Process 16.1 cm    Across Hand at [employer] 19.1 cm    At [place] of 2nd Digit 6.4 cm             L-DEX FLOWSHEETS - 02/05/21 1100       L-DEX LYMPHEDEMA SCREENING   Measurement Type Unilateral    L-DEX MEASUREMENT EXTREMITY Upper Extremity    POSITION  Standing    DOMINANT SIDE Right    At Risk Side Right    BASELINE SCORE (UNILATERAL) -0.7   Baseline taken at eval              Quick Dash - 02/05/21 0001     Open a tight or new jar Mild difficulty    Do heavy household chores (wash walls, wash floors) Moderate difficulty    Carry a shopping bag or briefcase No difficulty    Wash your back Moderate difficulty    Use a knife to cut food Mild difficulty    Recreational activities in  which you take some force or impact through your arm, shoulder, or hand (golf, hammering, tennis) Moderate difficulty    During the past week, to what extent has your arm, shoulder or hand problem interfered with your normal social activities with family, friends, neighbors, or groups? Slightly    During the past week, to what extent has your arm, shoulder or hand problem limited your work or other regular daily activities Slightly    Arm, shoulder, or hand pain. Mild    Tingling (pins and needles) in your arm, shoulder, or hand None    Difficulty Sleeping Moderate difficulty    DASH Score 29.55 %                    [facility] Adult PT Treatment/Exercise - 02/05/21 0001       Manual Therapy   Manual Therapy Myofascial release;Passive ROM    Myofascial Release To right axilla to reduce cording; heard/felt 2 axillary cords pop during treatment    Passive ROM PROM to right shoulder abduction in supine to tolerance                     PT Education - 02/05/21 1222     Education Details Aftercare; scar massage; HEP  Person(s) Educated Patient    Methods Explanation;Demonstration;Handout    Comprehension Verbalized understanding;Returned demonstration                 PT Long Term Goals - 02/05/21 1242       PT LONG TERM GOAL #1   Title Patient will demonstrate she has regained full shoulder ROM and function post operatively compared to baselines.    Time 4    Period Weeks    Status New    Target Date 03/05/21      PT LONG TERM GOAL #2   Title Patient will increase right shoulder AROM to be >/= 145 degrees for increased ease reaching.    Baseline 149 pre-op and 128 post op    Time 4    Period Weeks    Status New    Target Date 03/05/21      PT LONG TERM GOAL #3   Title Patient will increase right shoulder AROM abduction to >/= 160 degrees for increased ease obtaining radiation positioning.    Baseline 164 pre op and 112 post op    Time 4    Period  Weeks    Status New    Target Date 03/05/21      PT LONG TERM GOAL #4   Title Patient will improve her DASH score to zero for right UE function to return to baseline.    Time 4    Period Weeks    Status New    Target Date 03/05/21      PT LONG TERM GOAL #5   Title Patient will verbalize good understanding of lymphedema risk reduction.    Time 4    Period Weeks    Status New    Target Date 03/05/21                   Plan - 02/05/21 1237     Clinical Impression Statement Patient is doing very well s/p bilateral mastectomies and right axillary lympmh node dissection with 15 nodes removed (1 positive). She tolerated neoadjuvant chemotherapy well and will start radiation next week. She has very limited right shoulder ROM which appears to be mostly due to axillary cording with several thin cords. She had good success with manual therapy today releasing some cords and gained about 10 degrees of flexion and 20 degrees of abduction as a result. She will benefit from PT to address ROM limitations, pain from cording, and scar tissue tightness.    PT Frequency 2x / week    PT Duration 4 weeks    PT Treatment/Interventions ADLs/Self Care Home Management;Therapeutic exercise;Patient/family education;Manual techniques;Manual lymph drainage;Passive range of motion;Scar mobilization    PT Next Visit Plan Continue myofascial release for cording, PROM, scar tissue massage    PT Home Exercise Plan Post op HEP    Consulted and Agree with Plan of Care Patient             Patient will benefit from skilled therapeutic intervention in order to improve the following deficits and impairments:  Postural dysfunction, Decreased range of motion, Decreased knowledge of precautions, Impaired UE functional use, Pain, Increased fascial restricitons  Visit Diagnosis: Malignant neoplasm of lower-inner quadrant of right breast of female, estrogen receptor positive ([place]) - Plan: PT plan of care  cert/re-cert  Abnormal posture - Plan: PT plan of care cert/re-cert  Stiffness of right shoulder, not elsewhere classified - Plan: PT plan of care cert/re-cert  Stiffness of left shoulder, not elsewhere  classified - Plan: PT plan of care cert/re-cert  Aftercare following surgery for neoplasm - Plan: PT plan of care cert/re-cert     Problem List Patient Active Problem List   Diagnosis Date Noted   Anxiety 01/23/2021   Attention deficit disorder 01/23/2021   History of medullary carcinoma of thyroid 01/23/2021   History of substance abuse ([place]) 01/23/2021   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   Prolonged QT interval 07/30/2020   Bone lesion 07/23/2020   MEN 2A (multiple endocrine neoplasia, type 2A) ([place]) 04/12/2019   Multiple thyroid nodules 04/08/2019   Family history of breast cancer    Family history of prostate cancer    Family history of lymphoma    Family history of ovarian cancer    Monoallelic mutation of CHEK2 gene in female patient    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

## 2021-02-06 ENCOUNTER — Other Ambulatory Visit: Payer: Self-pay

## 2021-02-06 ENCOUNTER — Ambulatory Visit: Payer: 59 | Admitting: Rehabilitation

## 2021-02-06 ENCOUNTER — Encounter: Payer: Self-pay | Admitting: Rehabilitation

## 2021-02-06 DIAGNOSIS — C50211 Malignant neoplasm of upper-inner quadrant of right female breast: Secondary | ICD-10-CM | POA: Diagnosis not present

## 2021-02-06 DIAGNOSIS — M25612 Stiffness of left shoulder, not elsewhere classified: Secondary | ICD-10-CM

## 2021-02-06 DIAGNOSIS — R293 Abnormal posture: Secondary | ICD-10-CM

## 2021-02-06 DIAGNOSIS — Z483 Aftercare following surgery for neoplasm: Secondary | ICD-10-CM

## 2021-02-06 DIAGNOSIS — C50311 Malignant neoplasm of lower-inner quadrant of right female breast: Secondary | ICD-10-CM

## 2021-02-06 DIAGNOSIS — M25611 Stiffness of right shoulder, not elsewhere classified: Secondary | ICD-10-CM

## 2021-02-06 NOTE — Therapy (Signed)
[facility] @ [address] [place], [place], [address] Phone: [phone]   Fax:  [phone]  Physical Therapy Treatment  Patient Details  Name: Kayla Little MRN: [id number] Date of Birth: [birth year] Referring Provider (PT): Dr. Autumn Messing   Encounter Date: 02/06/2021   PT End of Session - 02/06/21 1144     Visit Number 3    Number of Visits 10    Date for PT Re-Evaluation 03/05/21    PT Start Time 1105    PT Stop Time 5102    PT Time Calculation (min) 39 min    Activity Tolerance Patient tolerated treatment well    Behavior During Therapy [facility] for tasks assessed/performed             Past Medical History:  Diagnosis Date   ADD (attention deficit disorder)    Breast cancer ([place]) 06/30/2020   Breast lump    R breast   Depression    Family history of breast cancer    Family history of lymphoma    Family history of ovarian cancer    Family history of prostate cancer    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)    Hx of varicella    Kidney stone 2012   Medullary thyroid carcinoma ([place])    Monoallelic mutation of CHEK2 gene in female patient    Postpartum care following vaginal delivery (7/27) 10/02/2014, June 16 , 2020   Second degree perineal laceration 08/23/2018   SVD (spontaneous vaginal delivery) 08/23/2018   Thyroid cancer ([place]) 2021    Past Surgical History:  Procedure Laterality Date   IR IMAGING GUIDED PORT INSERTION  07/22/2020   MODIFIED MASTECTOMY Right 01/07/2021   Procedure: RIGHT MODIFIED RADICAL MASTECTOMY;  Surgeon: Jovita Kussmaul, MD;  Location: [facility];  Service: General;  Laterality: Right;   NO PAST SURGERIES     SIMPLE MASTECTOMY WITH AXILLARY SENTINEL NODE BIOPSY Left 01/07/2021   Procedure: LEFT SIMPLE MASTECTOMY;  Surgeon: Jovita Kussmaul, MD;  Location: [facility];  Service: General;  Laterality: Left;   THYROIDECTOMY N/A 04/12/2019    Procedure: TOTAL THYROIDECTOMY WITH LIMITED LYMPH NODE DISSECTION;  Surgeon: Armandina Gemma, MD;  [facility];  Service: General;  Laterality: N/A;   UNILATERAL SALPINGECTOMY Right 07/09/2017   Procedure: RIGHT SALPINGECTOMY WITH REMOVAL OF ECTOPIC PREGNANCY;  Surgeon: Azucena Fallen, MD;  Location: [facility];  Service: Gynecology;  Laterality: Right;    There were no vitals filed for this visit.   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 She underwent neoadjuvant chemotherapy from 07/23/2020-11/06/2020 followed by a bilateral mastectomy with a right axillary lymph node dissection (1/15 nodes positive). She has a hx of medullary thyroid cancer with a thyroidectomy 04/12/2019. She smokes cigarettes infrequently and drinks red wine daily with varying amounts 0-3 glasses.    Currently in Pain? No/denies                               [facility] Adult PT Treatment/Exercise - 02/06/21 0001       Manual Therapy   Myofascial Release to the Rt axilla and upper arm following cords with opposite pressure and S stroke pressure    Passive ROM  to Rt shoulder into flexion, abd, and ER with and without axillary blocking for cord stretch                          PT Long Term Goals - 02/05/21 1242       PT LONG TERM GOAL #1   Title Patient will demonstrate she has regained full shoulder ROM and function post operatively compared to baselines.    Time 4    Period Weeks    Status New    Target Date 03/05/21      PT LONG TERM GOAL #2   Title Patient will increase right shoulder AROM to be >/= 145 degrees for increased ease reaching.    Baseline 149 pre-op and 128 post op    Time 4    Period Weeks    Status New    Target Date 03/05/21      PT LONG TERM GOAL #3   Title Patient will increase right shoulder AROM  abduction to >/= 160 degrees for increased ease obtaining radiation positioning.    Baseline 164 pre op and 112 post op    Time 4    Period Weeks    Status New    Target Date 03/05/21      PT LONG TERM GOAL #4   Title Patient will improve her DASH score to zero for right UE function to return to baseline.    Time 4    Period Weeks    Status New    Target Date 03/05/21      PT LONG TERM GOAL #5   Title Patient will verbalize good understanding of lymphedema risk reduction.    Time 4    Period Weeks    Status New    Target Date 03/05/21                   Plan - 02/06/21 1144     Clinical Impression Statement Pt reported feeling good after last session but then tight again today.  Continued Rt cording release and PROM.  Checked PROM of the left side with some tightness noted per patient but this seems like pectoralis tightness.    PT Frequency 2x / week    PT Duration 4 weeks    PT Treatment/Interventions ADLs/Self Care Home Management;Therapeutic exercise;Patient/family education;Manual techniques;Manual lymph drainage;Passive range of motion;Scar mobilization    PT Next Visit Plan Continue myofascial release for cording, PROM, pulleys    Consulted and Agree with Plan of Care Patient             Patient will benefit from skilled therapeutic intervention in order to improve the following deficits and impairments:     Visit Diagnosis: Malignant neoplasm of lower-inner quadrant of right breast of female, estrogen receptor positive ([facility])  Abnormal posture  Stiffness of right shoulder, not elsewhere classified  Stiffness of left shoulder, not elsewhere classified  Aftercare following surgery for neoplasm     Problem List Patient Active Problem List   Diagnosis Date Noted   Anxiety 01/23/2021   Attention deficit disorder 01/23/2021   History of medullary carcinoma of thyroid 01/23/2021   History of substance abuse ([place]) 01/23/2021   Hypoparathyroidism  ([place]) 01/23/2021   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   Prolonged QT interval 07/30/2020   Bone lesion 07/23/2020   MEN 2A (multiple endocrine neoplasia, type 2A) ([place]) 04/12/2019   Multiple thyroid nodules 04/08/2019   Family history of breast cancer  Family history of prostate cancer    Family history of lymphoma    Family history of ovarian cancer    Monoallelic mutation of CHEK2 gene in female patient    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

## 2021-02-10 ENCOUNTER — Ambulatory Visit: Payer: 59

## 2021-02-10 ENCOUNTER — Other Ambulatory Visit: Payer: Self-pay

## 2021-02-10 DIAGNOSIS — M25611 Stiffness of right shoulder, not elsewhere classified: Secondary | ICD-10-CM

## 2021-02-10 DIAGNOSIS — C50311 Malignant neoplasm of lower-inner quadrant of right female breast: Secondary | ICD-10-CM

## 2021-02-10 DIAGNOSIS — R293 Abnormal posture: Secondary | ICD-10-CM

## 2021-02-10 DIAGNOSIS — Z170 Estrogen receptor positive status [ER+]: Secondary | ICD-10-CM

## 2021-02-10 DIAGNOSIS — Z483 Aftercare following surgery for neoplasm: Secondary | ICD-10-CM

## 2021-02-10 DIAGNOSIS — C50211 Malignant neoplasm of upper-inner quadrant of right female breast: Secondary | ICD-10-CM | POA: Insufficient documentation

## 2021-02-10 DIAGNOSIS — M25612 Stiffness of left shoulder, not elsewhere classified: Secondary | ICD-10-CM

## 2021-02-10 LAB — SURGICAL PATHOLOGY

## 2021-02-10 NOTE — Therapy (Signed)
[facility] @ [place], [place], [address] Phone: [phone]   Fax:  [phone]  Physical Therapy Treatment  Patient Details  Name: Kayla Little MRN: [id number] Date of Birth: 12-27-1985 Referring Provider (PT): Dr. Autumn Messing   Encounter Date: 02/10/2021   PT End of Session - 02/10/21 1418     Visit Number 4    Number of Visits 10    Date for PT Re-Evaluation 03/05/21    PT Start Time 6222    PT Stop Time 9798    PT Time Calculation (min) 45 min    Activity Tolerance Patient tolerated treatment well    Behavior During Therapy [facility] for tasks assessed/performed             Past Medical History:  Diagnosis Date   ADD (attention deficit disorder)    Breast cancer ([place]) 06/30/2020   Breast lump    R breast   Depression    Family history of breast cancer    Family history of lymphoma    Family history of ovarian cancer    Family history of prostate cancer    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)    Hx of varicella    Kidney stone 2012   Medullary thyroid carcinoma ([place])    Monoallelic mutation of CHEK2 gene in female patient    Postpartum care following vaginal delivery (7/27) 10/02/2014, June 16 , 2020   Second degree perineal laceration 08/23/2018   SVD (spontaneous vaginal delivery) 08/23/2018   Thyroid cancer ([place]) 2021    Past Surgical History:  Procedure Laterality Date   IR IMAGING GUIDED PORT INSERTION  07/22/2020   MODIFIED MASTECTOMY Right 01/07/2021   Procedure: RIGHT MODIFIED RADICAL MASTECTOMY;  Surgeon: Jovita Kussmaul, MD;  Location: [facility];  Service: General;  Laterality: Right;   NO PAST SURGERIES     SIMPLE MASTECTOMY WITH AXILLARY SENTINEL NODE BIOPSY Left 01/07/2021   Procedure: LEFT SIMPLE MASTECTOMY;  Surgeon: Jovita Kussmaul, MD;  Location: [facility] [place] [facility];  Service: General;  Laterality: Left;   THYROIDECTOMY N/A 04/12/2019    Procedure: TOTAL THYROIDECTOMY WITH LIMITED LYMPH NODE DISSECTION;  Surgeon: Armandina Gemma, MD;  Location: [facility];  Service: General;  Laterality: N/A;   UNILATERAL SALPINGECTOMY Right 07/09/2017   Procedure: RIGHT SALPINGECTOMY WITH REMOVAL OF ECTOPIC PREGNANCY;  Surgeon: Azucena Fallen, MD;  Location: [facility];  Service: Gynecology;  Laterality: Right;    There were no vitals filed for this visit.   Subjective Assessment - 02/10/21 1413     Subjective 11 min late. I feel a little more swollen under my arm and my arm can't reach as high so it might be from the cording. Thursday is when I start radiation.    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 She underwent neoadjuvant chemotherapy from 07/23/2020-11/06/2020 followed by a bilateral mastectomy with a right axillary lymph node dissection (1/15 nodes positive). She has a hx of medullary thyroid cancer with a thyroidectomy 04/12/2019. She smokes cigarettes infrequently and drinks red wine daily with varying amounts 0-3 glasses.    Patient Stated Goals Get my arms back to normal    Currently in Pain? No/denies    Pain Score 2     Pain Location Axilla  [place] Adult PT Treatment/Exercise - 02/10/21 0001       Shoulder Exercises: Supine   Horizontal ABduction Strengthening;Both;10 reps    Theraband Level (Shoulder Horizontal ABduction) Level 1 (Yellow)      Manual Therapy   Manual Therapy Soft tissue mobilization;Myofascial release;Passive ROM    Soft tissue mobilization to right pectorals, lats, UT supine to decrease tissue tension    Myofascial Release to the Rt axilla and upper arm following cords with opposite pressure and S stroke pressure    Passive ROM to Rt shoulder into flexion, abd, and ER with and without axillary blocking for cord stretch                          PT Long Term  Goals - 02/05/21 1242       PT LONG TERM GOAL #1   Title Patient will demonstrate she has regained full shoulder ROM and function post operatively compared to baselines.    Time 4    Period Weeks    Status New    Target Date 03/05/21      PT LONG TERM GOAL #2   Title Patient will increase right shoulder AROM to be >/= 145 degrees for increased ease reaching.    Baseline 149 pre-op and 128 post op    Time 4    Period Weeks    Status New    Target Date 03/05/21      PT LONG TERM GOAL #3   Title Patient will increase right shoulder AROM abduction to >/= 160 degrees for increased ease obtaining radiation positioning.    Baseline 164 pre op and 112 post op    Time 4    Period Weeks    Status New    Target Date 03/05/21      PT LONG TERM GOAL #4   Title Patient will improve her DASH score to zero for right UE function to return to baseline.    Time 4    Period Weeks    Status New    Target Date 03/05/21      PT LONG TERM GOAL #5   Title Patient will verbalize good understanding of lymphedema risk reduction.    Time 4    Period Weeks    Status New    Target Date 03/05/21                   Plan - 02/10/21 1458     Clinical Impression Statement Therapy consisted of sof tissue mobilization to right pecs, lats, UT, MFR to right UE cording, PROM to bilateral shoulders.  Discussed prophylactic sleeve and sent Demo sheet to [facility] since she is [facility].  Tight and tender in pecs and lats.  3 pops noted with MFR techniques to cording.  Improving PROM    Stability/Clinical Decision Making Stable/Uncomplicated    Rehab Potential Good    PT Frequency 2x / week    PT Duration 4 weeks    PT Treatment/Interventions ADLs/Self Care Home Management;Therapeutic exercise;Patient/family education;Manual techniques;Manual lymph drainage;Passive range of motion;Scar mobilization    PT Next Visit Plan Continue myofascial release for cording, PROM, pulleys,  STM  measure for prophylactic  sleeve, (sent demo to [facility] today)    PT Home Exercise Plan Post op HEP    Consulted and Agree with Plan of Care Patient             Patient will benefit from skilled therapeutic  intervention in order to improve the following deficits and impairments:  Postural dysfunction, Decreased range of motion, Decreased knowledge of precautions, Impaired UE functional use, Pain, Increased fascial restricitons  Visit Diagnosis: Malignant neoplasm of lower-inner quadrant of right breast of female, estrogen receptor positive (HCC)  Abnormal posture  Stiffness of right shoulder, not elsewhere classified  Stiffness of left shoulder, not elsewhere classified  Aftercare following surgery for neoplasm     Problem List Patient Active Problem List   Diagnosis Date Noted   Anxiety 01/23/2021   Attention deficit disorder 01/23/2021   History of medullary carcinoma of thyroid 01/23/2021   History of substance abuse ([place]) 01/23/2021   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   Prolonged QT interval 07/30/2020   Bone lesion 07/23/2020   MEN 2A (multiple endocrine neoplasia, type 2A) ([place]) 04/12/2019   Multiple thyroid nodules 04/08/2019   Family history of breast cancer    Family history of prostate cancer    Family history of lymphoma    Family history of ovarian cancer    Monoallelic mutation of CHEK2 gene in female patient    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

## 2021-02-11 ENCOUNTER — Encounter: Payer: Self-pay | Admitting: *Deleted

## 2021-02-11 NOTE — Progress Notes (Incomplete)

## 2021-02-12 ENCOUNTER — Other Ambulatory Visit: Payer: Self-pay

## 2021-02-12 ENCOUNTER — Ambulatory Visit: Payer: 59 | Admitting: Rehabilitation

## 2021-02-12 ENCOUNTER — Encounter: Payer: Self-pay | Admitting: Rehabilitation

## 2021-02-12 ENCOUNTER — Ambulatory Visit
Admission: RE | Admit: 2021-02-12 | Discharge: 2021-02-12 | Disposition: A | Discharge status: Home / Self Care | Payer: 59 | Source: Ambulatory Visit | Attending: Radiation Oncology | Admitting: Radiation Oncology

## 2021-02-12 DIAGNOSIS — M25611 Stiffness of right shoulder, not elsewhere classified: Secondary | ICD-10-CM

## 2021-02-12 DIAGNOSIS — C50311 Malignant neoplasm of lower-inner quadrant of right female breast: Secondary | ICD-10-CM

## 2021-02-12 DIAGNOSIS — M25612 Stiffness of left shoulder, not elsewhere classified: Secondary | ICD-10-CM

## 2021-02-12 DIAGNOSIS — R293 Abnormal posture: Secondary | ICD-10-CM

## 2021-02-12 DIAGNOSIS — Z483 Aftercare following surgery for neoplasm: Secondary | ICD-10-CM

## 2021-02-12 DIAGNOSIS — Z170 Estrogen receptor positive status [ER+]: Secondary | ICD-10-CM

## 2021-02-12 DIAGNOSIS — C50211 Malignant neoplasm of upper-inner quadrant of right female breast: Secondary | ICD-10-CM | POA: Diagnosis not present

## 2021-02-12 NOTE — Therapy (Signed)
[facility] @ [place], [place], [address] Phone: [phone]   Fax:  [phone]  Physical Therapy Treatment  Patient Details  Name: Kayla Little MRN: [id number] Date of Birth: 12/04/1985 Referring Provider (PT): Dr. Autumn Messing   Encounter Date: 02/12/2021   PT End of Session - 02/12/21 1415     Visit Number 5    Number of Visits 10    Date for PT Re-Evaluation 03/05/21    PT Start Time 1331    PT Stop Time 1017    PT Time Calculation (min) 44 min    Activity Tolerance Patient tolerated treatment well    Behavior During Therapy [facility] for tasks assessed/performed             Past Medical History:  Diagnosis Date   ADD (attention deficit disorder)    Breast cancer ([place]) 06/30/2020   Breast lump    R breast   Depression    Family history of breast cancer    Family history of lymphoma    Family history of ovarian cancer    Family history of prostate cancer    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)    Hx of varicella    Kidney stone 2012   Medullary thyroid carcinoma ([place])    Monoallelic mutation of CHEK2 gene in female patient    Postpartum care following vaginal delivery (7/27) 10/02/2014, June 16 , 2020   Second degree perineal laceration 08/23/2018   SVD (spontaneous vaginal delivery) 08/23/2018   Thyroid cancer ([place]) 2021    Past Surgical History:  Procedure Laterality Date   IR IMAGING GUIDED PORT INSERTION  07/22/2020   MODIFIED MASTECTOMY Right 01/07/2021   Procedure: RIGHT MODIFIED RADICAL MASTECTOMY;  Surgeon: Jovita Kussmaul, MD;  Location: [facility];  Service: General;  Laterality: Right;   NO PAST SURGERIES     SIMPLE MASTECTOMY WITH AXILLARY SENTINEL NODE BIOPSY Left 01/07/2021   Procedure: LEFT SIMPLE MASTECTOMY;  Surgeon: Jovita Kussmaul, MD;  Location: [facility];  Service: General;  Laterality: Left;   THYROIDECTOMY N/A 04/12/2019    Procedure: TOTAL THYROIDECTOMY WITH LIMITED LYMPH NODE DISSECTION;  Surgeon: Armandina Gemma, MD;  Location: [facility];  Service: General;  Laterality: N/A;   UNILATERAL SALPINGECTOMY Right 07/09/2017   Procedure: RIGHT SALPINGECTOMY WITH REMOVAL OF ECTOPIC PREGNANCY;  Surgeon: Azucena Fallen, MD;  Location: [facility];  Service: Gynecology;  Laterality: Right;    There were no vitals filed for this visit.   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 She underwent neoadjuvant chemotherapy from 07/23/2020-11/06/2020 followed by a bilateral mastectomy with a right axillary lymph node dissection (1/15 nodes positive). She has a hx of medullary thyroid cancer with a thyroidectomy 04/12/2019. She smokes cigarettes infrequently and drinks red wine daily with varying amounts 0-3 glasses.    Currently in Pain? No/denies                               [facility] Adult PT Treatment/Exercise - 02/12/21 0001       Exercises   Exercises Shoulder      Manual Therapy   Manual therapy comments measured pt for juzo soft size 2  max sleeve and medium gauntlet    Soft tissue mobilization to right pectorals, lats, UT supine to decrease tissue tension    Myofascial Release to the Rt axilla and upper arm following cords with opposite pressure and S stroke pressure    Passive ROM to Rt shoulder into flexion, abd, and ER with and without axillary blocking for cord stretch                          PT Long Term Goals - 02/05/21 1242       PT LONG TERM GOAL #1   Title Patient will demonstrate she has regained full shoulder ROM and function post operatively compared to baselines.    Time 4    Period Weeks    Status New    Target Date 03/05/21      PT LONG TERM GOAL #2   Title Patient will increase right shoulder AROM  to be >/= 145 degrees for increased ease reaching.    Baseline 149 pre-op and 128 post op    Time 4    Period Weeks    Status New    Target Date 03/05/21      PT LONG TERM GOAL #3   Title Patient will increase right shoulder AROM abduction to >/= 160 degrees for increased ease obtaining radiation positioning.    Baseline 164 pre op and 112 post op    Time 4    Period Weeks    Status New    Target Date 03/05/21      PT LONG TERM GOAL #4   Title Patient will improve her DASH score to zero for right UE function to return to baseline.    Time 4    Period Weeks    Status New    Target Date 03/05/21      PT LONG TERM GOAL #5   Title Patient will verbalize good understanding of lymphedema risk reduction.    Time 4    Period Weeks    Status New    Target Date 03/05/21                   Plan - 02/12/21 1415     Clinical Impression Statement Gave pt information on compression sleeve ordering and she is going to check on inusrance coverage just to double check.  Continued MT to release cording and tightness in the Rt upper quadrant.    PT Frequency 2x / week    PT Duration 4 weeks    PT Treatment/Interventions ADLs/Self Care Home Management;Therapeutic exercise;Patient/family education;Manual techniques;Manual lymph drainage;Passive range of motion;Scar mobilization    PT Next Visit Plan Continue myofascial release for cording, PROM, pulleys    Consulted and Agree with Plan of Care Patient             Patient will benefit from skilled therapeutic intervention in order to improve the following deficits and impairments:     Visit Diagnosis: Malignant neoplasm of lower-inner quadrant of right breast of female, estrogen receptor positive ([place])  Abnormal posture  Stiffness of right shoulder, not elsewhere classified  Stiffness of left shoulder, not elsewhere classified  Aftercare following surgery for neoplasm     Problem List Patient Active Problem List    Diagnosis Date Noted   Anxiety 01/23/2021   Attention deficit disorder 01/23/2021   History of medullary carcinoma of thyroid 01/23/2021   History of substance abuse ([place]) 01/23/2021   Hypoparathyroidism (  [place]) 01/23/2021   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   Prolonged QT interval 07/30/2020   Bone lesion 07/23/2020   MEN 2A (multiple endocrine neoplasia, type 2A) ([place]) 04/12/2019   Multiple thyroid nodules 04/08/2019   Family history of breast cancer    Family history of prostate cancer    Family history of lymphoma    Family history of ovarian cancer    Monoallelic mutation of CHEK2 gene in female patient    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

## 2021-02-13 ENCOUNTER — Ambulatory Visit
Admission: RE | Admit: 2021-02-13 | Discharge: 2021-02-13 | Disposition: A | Discharge status: Home / Self Care | Payer: 59 | Source: Ambulatory Visit | Attending: Radiation Oncology | Admitting: Radiation Oncology

## 2021-02-13 DIAGNOSIS — C50211 Malignant neoplasm of upper-inner quadrant of right female breast: Secondary | ICD-10-CM

## 2021-02-13 DIAGNOSIS — Z170 Estrogen receptor positive status [ER+]: Secondary | ICD-10-CM

## 2021-02-13 MED ORDER — RADIAPLEXRX EX GEL: Freq: Once | CUTANEOUS | Status: AC

## 2021-02-13 NOTE — Progress Notes (Signed)
Pt here for patient teaching.  Pt given Radiation and You booklet, skin care instructions, Alra deodorant, and Radiaplex gel.  Reviewed areas of pertinence such as fatigue, hair loss, skin changes, breast tenderness, and breast swelling . Pt able to give teach back of to pat skin and use unscented/gentle soap,apply Radiaplex bid, avoid applying anything to skin within 4 hours of treatment, avoid wearing an under wire bra, and to use an electric razor if they must shave. Pt verbalizes understanding of information given and will contact nursing with any questions or concerns.    Taylia Berber M. Elsy Chiang RN, BSN      

## 2021-02-15 NOTE — Progress Notes (Signed)
[facility] Follow up:    [place], [place] @ [place] [address]   DIAGNOSIS:  Cancer Staging  Malignant neoplasm of upper-inner quadrant of right breast in female, estrogen receptor positive ([place]) Staging form: Breast, AJCC 8th Edition - Clinical stage from 07/08/2020: Stage IIIA (cT3, cN3, cM0, G1, ER+, PR+, HER2+) - Unsigned Stage prefix: Initial diagnosis Histologic grading system: 3 grade system - Pathologic stage from 01/07/2021: No Stage Recommended (ypT2, pN19m, cM0, G2) - Signed by CGardenia Phlegm NP on 01/15/2021 Stage prefix: Post-therapy Histologic grading system: 3 grade system   SUMMARY OF ONCOLOGIC HISTORY: Oncology History  Malignant neoplasm of upper-inner quadrant of right breast in female, estrogen receptor positive ([place]  07/08/2020 Initial Diagnosis   a clinical mT2 N3, stage IIIA invasive ductal carcinoma, grade 1, triple positive  (a) breast MRI 07/06/2020 finds a 1.6 cm lower inner quadrant mass associated with 5.4 cm of non-mass-like enhancement, at least 8 enlarged right axillary lymph node and 1 right internal mammary lymph node  (b) CT scans of the neck and chest 06/24/2020 shows subcentimeter right cervical adenopathy, 2 right supraclavicular lymph nodes the larger 0.65 cm, also left cervical adenopathy largest measuring 1.02 cm; there is also a 1.2 cm lesion in the left hepatic lobe, and a sclerotic lesion in the mid thoracic spine at T7  (c) PET scan 07/21/2020 shows regional adenopathy (including 2 right subcentimeter supraclavicular lymph nodes and subcentimeter lymph nodes in the right hilar, subcarinal and lateral aortic region), no uptake in liver, and an indeterminate T12 lesion  (d) thoracic spine MRI 07/27/2020 shows the T12 lesion to be likely benign, there is also degenerative disc disease   07/23/2020 - 12/18/2020 Neo-Adjuvant Chemotherapy   neoadjuvant chemotherapy will consist  of carboplatin, docetaxel, trastuzumab, and pertuzumab q. 21 days x 6 07/23/2020-11/06/2020 --two doses of Trastuzumab given on 9/22 and 10/13   01/07/2021 Cancer Staging   Staging form: Breast, AJCC 8th Edition - Pathologic stage from 01/07/2021: No Stage Recommended (ypT2, pN164m cM0, G2) - Signed by CaGardenia PhlegmNP on 01/15/2021 Stage prefix: Post-therapy Histologic grading system: 3 grade system    01/07/2021 Surgery   She underwent bilateral mastectomy on 01/07/2021 that showed: left breast no evidence of malignancy, and the right breast invasive ductal carcinoma grade 2, spanning fibrotic area of approximately 5 cm in patches.  Margins were negative.   (A) Microscopic metastatic carcinoma was noted in 1 out of 15 lymph nodes biopsied.   01/26/2021 -  Chemotherapy   Patient is on Treatment Plan : BREAST ADO-Trastuzumab Emtansine (Kadcyla) q21d      Radiation Therapy       Anti-estrogen oral therapy        CURRENT THERAPY: Kadcyla given every 3 weeks; adjuvant radiation therapy.  INTERVAL HISTORY: Kayla VARONE89.o. female returns for evaluation prior to receiving her next cycle of Kadcyla.  Please note her most recent echocardiogram was completed on December 29, 2020 which showed a left ventricular ejection fraction of 60 to 65%.  She also began radiation therapy on February 13, 2021.  Kayla Little notes that she tolerated treatment quite well.  She denies any concerns today.  She also has been undergoing radiation and has no concerns with this as well.  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   Prolonged QT interval 07/30/2020   Bone lesion 07/23/2020   MEN 2A (multiple endocrine neoplasia, type 2A) ([place]) 04/12/2019    Multiple thyroid nodules 04/08/2019   Family history of breast cancer    Family history of prostate cancer    Family history of lymphoma    Family history of ovarian cancer    Monoallelic mutation of CHEK2 gene in female patient    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)    Hx of varicella    Kidney stone 2012   Medullary thyroid carcinoma ([place])    Monoallelic mutation of CHEK2 gene in female patient    Postpartum care following vaginal delivery (7/27) 10/02/2014, June 16 , 2020   Second degree perineal laceration 08/23/2018   SVD (spontaneous vaginal delivery) 08/23/2018   Thyroid cancer ([place]) 2021    SURGICAL HISTORY: Past Surgical History:  Procedure Laterality Date   IR IMAGING GUIDED PORT INSERTION  07/22/2020   MODIFIED MASTECTOMY Right 01/07/2021   Procedure: RIGHT MODIFIED RADICAL MASTECTOMY;  Surgeon: Jovita Kussmaul, MD;  Location: [facility];  Service: General;  Laterality: Right;   NO PAST SURGERIES     SIMPLE MASTECTOMY WITH AXILLARY SENTINEL NODE BIOPSY Left 01/07/2021   Procedure: LEFT SIMPLE MASTECTOMY;  Surgeon: Jovita Kussmaul, MD;  Location: [facility];  Service: General;  Laterality: Left;   THYROIDECTOMY N/A 04/12/2019   Procedure: TOTAL THYROIDECTOMY WITH LIMITED LYMPH NODE DISSECTION;  Surgeon: Armandina Gemma, MD;  Location: [facility];  Service: General;  Laterality: N/A;    UNILATERAL SALPINGECTOMY Right 07/09/2017   Procedure: RIGHT SALPINGECTOMY WITH REMOVAL OF ECTOPIC PREGNANCY;  Surgeon: Azucena Fallen, MD;  Location: [facility];  Service: Gynecology;  Laterality: Right;    SOCIAL HISTORY: Social History   Socioeconomic History   Marital status: Married    Spouse name: Not on file   Number of children: Not on file   Years of education: Not on file   Highest education level: Not on file  Occupational History   Occupation: homemaker  Tobacco Use   Smoking status: Some Days    Packs/day: 0.50    Years: 5.00    Pack years: 2.50    Types: Cigarettes   Smokeless tobacco: Never   Tobacco comments:    Actively trying to quit.  Vaping Use   Vaping Use: Never used  Substance and Sexual Activity   Alcohol use: Yes    Alcohol/week: 7.0 - 14.0 standard drinks    Types: 7 - 14 Glasses of wine per week    Comment: glass of wine each night    Drug use: Not Currently    Types: Marijuana    Comment: not during pregnancy, last use was 03/27/2019  Sexual activity: Yes    Birth control/protection: None, I.U.D.  Other Topics Concern   Not on file  Social History Narrative   Not on file   Social Determinants of Health   Financial Resource Strain: Low Risk    Difficulty of Paying Living Expenses: Not hard at all  Food Insecurity: No Food Insecurity   Worried About Charity fundraiser in the Last Year: Never true   [place] in the Last Year: Never true  Transportation Needs: No Transportation Needs   Lack of Transportation (Medical): No   Lack of Transportation (Non-Medical): No  Physical Activity: Not on file  Stress: Not on file  Social Connections: Not on file  Intimate Partner Violence: Not on file   (updated 07/2020)  Tobin Chad" works in an Proofreader.  Her husband Barnabas Lister works for [employer].  Their children are Dylan 5 and Emma 2.  Barnabas Lister has a 64 year old daughter, the patient's stepdaughter Hildred Alamin, currently attending [employer].   The patient is a [employer]  FAMILY HISTORY: Family History  Problem Relation Age of Onset   Breast cancer Mother 78       2 types of cancer one side mastectomy with chemo   Endometriosis Mother    Endometriosis Maternal Grandmother    Breast cancer Maternal Grandmother        post menopausal   Diabetes Maternal Grandmother    Cancer Maternal Grandfather        prostate and lymphoma   Alzheimer's disease Paternal Grandmother    Cancer Father 61       prostate cancer   Aneurysm Paternal Grandfather        d. 47s    Review of Systems  Constitutional:  Positive for fatigue (mild). Negative for appetite change, chills, fever and unexpected weight change.  HENT:   Negative for hearing loss, lump/mass and trouble swallowing.   Eyes:  Negative for eye problems and icterus.  Respiratory:  Negative for chest tightness, cough and shortness of breath.   Cardiovascular:  Negative for chest pain, leg swelling and palpitations.  Gastrointestinal:  Negative for abdominal distention, abdominal pain, constipation, diarrhea, nausea and vomiting.  Endocrine: Negative for hot flashes.  Genitourinary:  Negative for difficulty urinating.   Musculoskeletal:  Negative for arthralgias.  Skin:  Negative for itching and rash.  Neurological:  Negative for dizziness, extremity weakness, headaches and numbness.  Hematological:  Negative for adenopathy. Does not bruise/bleed easily.  Psychiatric/Behavioral:  Negative for depression. The patient is not nervous/anxious.      PHYSICAL EXAMINATION  ECOG PERFORMANCE STATUS: 1 - Symptomatic but completely ambulatory  Vitals:   02/16/21 0923  BP: 116/60  Pulse: 77  Resp: 18  Temp: (!) 96.8 F (36 C)  SpO2: 99%    Physical Exam Constitutional:      General: She is not in acute distress.    Appearance: Normal appearance. She is not toxic-appearing.  HENT:     Head: Normocephalic and atraumatic.  Eyes:     General: No scleral  icterus. Cardiovascular:     Rate and Rhythm: Normal rate and regular rhythm.     Pulses: Normal pulses.     Heart sounds: Normal heart sounds.  Pulmonary:     Effort: Pulmonary effort is normal.     Breath sounds: Normal breath sounds.  Abdominal:     General: Abdomen is flat. Bowel sounds are normal. There is no distension.     Palpations: Abdomen is  soft.     Tenderness: There is no abdominal tenderness.  Musculoskeletal:        General: No swelling.     Cervical back: Neck supple.  Lymphadenopathy:     Cervical: No cervical adenopathy.  Skin:    General: Skin is warm and dry.     Findings: No rash.  Neurological:     General: No focal deficit present.     Mental Status: She is alert.  Psychiatric:        Mood and Affect: Mood normal.        Behavior: Behavior normal.    LABORATORY DATA:  CBC    Component Value Date/Time   WBC 6.7 02/16/2021 0844   RBC 4.06 02/16/2021 0844   HGB 13.6 02/16/2021 0844   HGB 13.6 07/08/2020 1400   HGB 12.2 02/16/2017 1332   HCT 40.5 02/16/2021 0844   HCT 37.0 02/16/2017 1332   PLT 313 02/16/2021 0844   PLT 333 07/08/2020 1400   PLT 315 02/16/2017 1332   MCV 99.8 02/16/2021 0844   MCV 98 (H) 02/16/2017 1332   MCH 33.5 02/16/2021 0844   MCHC 33.6 02/16/2021 0844   RDW 11.9 02/16/2021 0844   RDW 12.8 02/16/2017 1332   LYMPHSABS 2.2 02/16/2021 0844   LYMPHSABS 2.2 02/16/2017 1332   MONOABS 0.6 02/16/2021 0844   EOSABS 0.1 02/16/2021 0844   EOSABS 0.1 02/16/2017 1332   BASOSABS 0.1 02/16/2021 0844   BASOSABS 0.0 02/16/2017 1332    CMP     Component Value Date/Time   NA 140 02/16/2021 0844   NA 142 02/16/2017 1332   K 4.1 02/16/2021 0844   CL 106 02/16/2021 0844   CO2 25 02/16/2021 0844   GLUCOSE 98 02/16/2021 0844   BUN 9 02/16/2021 0844   BUN 9 02/16/2017 1332   CREATININE 0.85 02/16/2021 0844   CREATININE 0.73 12/18/2020 0946   CREATININE 0.82 12/04/2012 1732   CALCIUM 8.8 (L) 02/16/2021 0844   PROT 6.7 02/16/2021  0844   PROT 6.5 02/16/2017 1332   ALBUMIN 4.0 02/16/2021 0844   ALBUMIN 4.3 02/16/2017 1332   AST 46 (H) 02/16/2021 0844   AST 23 12/18/2020 0946   ALT 36 02/16/2021 0844   ALT 23 12/18/2020 0946   ALKPHOS 58 02/16/2021 0844   BILITOT 0.3 02/16/2021 0844   BILITOT 0.7 12/18/2020 0946   GFRNONAA >60 02/16/2021 0844   GFRNONAA >60 12/18/2020 0946   GFRAA >60 04/15/2019 2145     ASSESSMENT and THERAPY PLAN:   Malignant neoplasm of upper-inner quadrant of right breast in female, estrogen receptor positive ([place]) Kayla Little is a 35 year old [place] woman who is here today for follow-up of her stage IIIa triple positive breast cancer.  Treatment history: 1.  Initial diagnosis with T2N3 triple positive breast cancer PET scan showed regional adenopathy with no distant metastasis. 2.  Neoadjuvant chemotherapy with TCHP x6 from 07/23/2020 through 11/06/2020 3.  2 doses of Herceptin on 11/27/2020 and 12/18/2020 4.  Bilateral mastectomies on January 07, 2021 which showed T2 N1 MI residual cancer. 5.  Patient began adjuvant Kadcyla x14 cycles beginning January 26, 2021 6.  Adjuvant radiation beginning February 13, 2021 7.  To be followed by adjuvant antiestrogen therapy.  ___________________________________________________________________________________________________________  Kayla Little continues on Kadcyla today along with her adjuvant radiation therapy Echo completed 12/29/2020 showing a normal EF of 50 to 55% Toxicities: none  She met briefly with Dr. Jana Hakim today and will see dr. Lindi Adie in 6 weeks.  She will return every 3 weeks for treatment, and every 6 weeks for provider follow-up in addition to her treatment.   All questions were answered. The patient knows to call the clinic with any problems, questions or concerns. We can certainly see the patient much sooner if necessary.  Total encounter time: 30 minutes in face-to-face visit time, chart review, lab review, order entry, care  coordination, discussion with other providers, and documentation of the encounter.  Wilber Bihari, NP 02/16/21 6:34 PM Medical Oncology and Hematology [facility] [place], [place] [address] Tel. [phone]    Fax. [phone]  *Total Encounter Time as defined by the Centers for Medicare and Medicaid Services includes, in addition to the face-to-face time of a patient visit (documented in the note above) non-face-to-face time: obtaining and reviewing outside history, ordering and reviewing medications, tests or procedures, care coordination (communications with other health care professionals or caregivers) and documentation in the medical record.

## 2021-02-15 NOTE — Assessment & Plan Note (Addendum)
Kayla Little is a 35 year old [place] woman who is here today for follow-up of her stage IIIa triple positive breast cancer.  Treatment history: 1.  Initial diagnosis with T2N3 triple positive breast cancer PET scan showed regional adenopathy with no distant metastasis. 2.  Neoadjuvant chemotherapy with TCHP x6 from 07/23/2020 through 11/06/2020 3.  2 doses of Herceptin on 11/27/2020 and 12/18/2020 4.  Bilateral mastectomies on January 07, 2021 which showed T2 N1 MI residual cancer. 5.  Patient began adjuvant Kadcyla x14 cycles beginning January 26, 2021 6.  Adjuvant radiation beginning February 13, 2021 7.  To be followed by adjuvant antiestrogen therapy.  ___________________________________________________________________________________________________________  Kayla Little continues on Kadcyla today along with her adjuvant radiation therapy Echo completed 12/29/2020 showing a normal EF of 50 to 55% Toxicities: none  She met briefly with Kayla Little today and will see dr. Lindi Adie in 6 weeks.    She will return every 3 weeks for treatment, and every 6 weeks for provider follow-up in addition to her treatment.

## 2021-02-16 ENCOUNTER — Inpatient Hospital Stay: Payer: 59

## 2021-02-16 ENCOUNTER — Ambulatory Visit
Admission: RE | Admit: 2021-02-16 | Discharge: 2021-02-16 | Disposition: A | Discharge status: Home / Self Care | Payer: 59 | Source: Ambulatory Visit | Attending: Radiation Oncology | Admitting: Radiation Oncology

## 2021-02-16 ENCOUNTER — Inpatient Hospital Stay (HOSPITAL_BASED_OUTPATIENT_CLINIC_OR_DEPARTMENT_OTHER): Payer: 59 | Admitting: Adult Health

## 2021-02-16 ENCOUNTER — Encounter: Payer: Self-pay | Admitting: Adult Health

## 2021-02-16 ENCOUNTER — Other Ambulatory Visit: Payer: Self-pay

## 2021-02-16 VITALS — BP 116/60 | HR 77 | Temp 96.8°F | Resp 18 | Wt 161.9 lb

## 2021-02-16 DIAGNOSIS — C50211 Malignant neoplasm of upper-inner quadrant of right female breast: Secondary | ICD-10-CM | POA: Insufficient documentation

## 2021-02-16 DIAGNOSIS — Z5112 Encounter for antineoplastic immunotherapy: Secondary | ICD-10-CM | POA: Insufficient documentation

## 2021-02-16 DIAGNOSIS — Z170 Estrogen receptor positive status [ER+]: Secondary | ICD-10-CM

## 2021-02-16 DIAGNOSIS — Z79899 Other long term (current) drug therapy: Secondary | ICD-10-CM | POA: Insufficient documentation

## 2021-02-16 DIAGNOSIS — Z9013 Acquired absence of bilateral breasts and nipples: Secondary | ICD-10-CM | POA: Insufficient documentation

## 2021-02-16 DIAGNOSIS — Z85850 Personal history of malignant neoplasm of thyroid: Secondary | ICD-10-CM | POA: Insufficient documentation

## 2021-02-16 DIAGNOSIS — Z9079 Acquired absence of other genital organ(s): Secondary | ICD-10-CM | POA: Insufficient documentation

## 2021-02-16 DIAGNOSIS — F17210 Nicotine dependence, cigarettes, uncomplicated: Secondary | ICD-10-CM | POA: Insufficient documentation

## 2021-02-16 DIAGNOSIS — E3122 Multiple endocrine neoplasia [MEN] type IIA: Secondary | ICD-10-CM

## 2021-02-16 DIAGNOSIS — Z95828 Presence of other vascular implants and grafts: Secondary | ICD-10-CM

## 2021-02-16 LAB — CBC WITH DIFFERENTIAL/PLATELET
Abs Immature Granulocytes: 0.02 10*3/uL (ref 0.00–0.07)
Basophils Absolute: 0.1 10*3/uL (ref 0.0–0.1)
Basophils Relative: 1 %
Eosinophils Absolute: 0.1 10*3/uL (ref 0.0–0.5)
Eosinophils Relative: 2 %
HCT: 40.5 % (ref 36.0–46.0)
Hemoglobin: 13.6 g/dL (ref 12.0–15.0)
Immature Granulocytes: 0 %
Lymphocytes Relative: 33 %
Lymphs Abs: 2.2 10*3/uL (ref 0.7–4.0)
MCH: 33.5 pg (ref 26.0–34.0)
MCHC: 33.6 g/dL (ref 30.0–36.0)
MCV: 99.8 fL (ref 80.0–100.0)
Monocytes Absolute: 0.6 10*3/uL (ref 0.1–1.0)
Monocytes Relative: 9 %
Neutro Abs: 3.7 10*3/uL (ref 1.7–7.7)
Neutrophils Relative %: 55 %
Platelets: 313 10*3/uL (ref 150–400)
RBC: 4.06 MIL/uL (ref 3.87–5.11)
RDW: 11.9 % (ref 11.5–15.5)
WBC: 6.7 10*3/uL (ref 4.0–10.5)
nRBC: 0 % (ref 0.0–0.2)

## 2021-02-16 LAB — COMPREHENSIVE METABOLIC PANEL
ALT: 36 U/L (ref 0–44)
AST: 46 U/L — ABNORMAL HIGH (ref 15–41)
Albumin: 4 g/dL (ref 3.5–5.0)
Alkaline Phosphatase: 58 U/L (ref 38–126)
Anion gap: 9 (ref 5–15)
BUN: 9 mg/dL (ref 6–20)
CO2: 25 mmol/L (ref 22–32)
Calcium: 8.8 mg/dL — ABNORMAL LOW (ref 8.9–10.3)
Chloride: 106 mmol/L (ref 98–111)
Creatinine, Ser: 0.85 mg/dL (ref 0.44–1.00)
GFR, Estimated: >60 mL/min (ref >60)
Glucose, Bld: 98 mg/dL (ref 70–99)
Potassium: 4.1 mmol/L (ref 3.5–5.1)
Sodium: 140 mmol/L (ref 135–145)
Total Bilirubin: 0.3 mg/dL (ref 0.3–1.2)
Total Protein: 6.7 g/dL (ref 6.5–8.1)

## 2021-02-16 LAB — PREGNANCY, URINE: Preg Test, Ur: NEGATIVE

## 2021-02-16 MED ORDER — DIPHENHYDRAMINE HCL 25 MG PO CAPS
25.0000 mg | ORAL_CAPSULE | Freq: Once | ORAL | Status: AC
  Administered 2021-02-16: 25 mg via ORAL
  Filled 2021-02-16: qty 1

## 2021-02-16 MED ORDER — ACETAMINOPHEN 325 MG PO TABS
650.0000 mg | ORAL_TABLET | Freq: Once | ORAL | Status: AC
  Administered 2021-02-16: 650 mg via ORAL
  Filled 2021-02-16: qty 2

## 2021-02-16 MED ORDER — SODIUM CHLORIDE 0.9 % IV SOLN: Freq: Once | INTRAVENOUS | Status: AC

## 2021-02-16 MED ORDER — SODIUM CHLORIDE 0.9% FLUSH
10.0000 mL | INTRAVENOUS | Status: DC | PRN
  Administered 2021-02-16: 10 mL

## 2021-02-16 MED ORDER — SODIUM CHLORIDE 0.9 % IV SOLN
3.4000 mg/kg | Freq: Once | INTRAVENOUS | Status: AC
  Administered 2021-02-16: 260 mg via INTRAVENOUS
  Filled 2021-02-16: qty 5

## 2021-02-16 MED ORDER — HEPARIN SOD (PORK) LOCK FLUSH 100 UNIT/ML IV SOLN
500.0000 [IU] | Freq: Once | INTRAVENOUS | Status: AC | PRN
  Administered 2021-02-16: 500 [IU]

## 2021-02-16 MED ORDER — SODIUM CHLORIDE 0.9% FLUSH
10.0000 mL | INTRAVENOUS | Status: DC | PRN
  Administered 2021-02-16: 10 mL via INTRAVENOUS

## 2021-02-16 NOTE — Progress Notes (Signed)
Pt not able to stay for 30 min. Observation because she has to go to radiation.

## 2021-02-16 NOTE — Progress Notes (Signed)
Pt scheduled her for echo at [facility] 03/10/21 at 0900. If that does not work for her, she can call to r/s [phone]. Kenney Houseman, RN aware and will let pt know

## 2021-02-16 NOTE — Patient Instructions (Signed)
[facility] MEDICAL ONCOLOGY  Discharge Instructions: °Thank you for choosing [facility] to provide your oncology and hematology care.  ° °If you have a lab appointment with the Cancer Center, please go directly to the Cancer Center and check in at the registration area. °  °Wear comfortable clothing and clothing appropriate for easy access to any Portacath or PICC line.  ° °We strive to give you quality time with your provider. You may need to reschedule your appointment if you arrive late (15 or more minutes).  Arriving late affects you and other patients whose appointments are after yours.  Also, if you miss three or more appointments without notifying the office, you may be dismissed from the clinic at the provider’s discretion.    °  °For prescription refill requests, have your pharmacy contact our office and allow 72 hours for refills to be completed.   ° °Today you received the following chemotherapy and/or immunotherapy agents Kadcyla    °  °To help prevent nausea and vomiting after your treatment, we encourage you to take your nausea medication as directed. ° °BELOW ARE SYMPTOMS THAT SHOULD BE REPORTED IMMEDIATELY: °*FEVER GREATER THAN 100.4 F (38 °C) OR HIGHER °*CHILLS OR SWEATING °*NAUSEA AND VOMITING THAT IS NOT CONTROLLED WITH YOUR NAUSEA MEDICATION °*UNUSUAL SHORTNESS OF BREATH °*UNUSUAL BRUISING OR BLEEDING °*URINARY PROBLEMS (pain or burning when urinating, or frequent urination) °*BOWEL PROBLEMS (unusual diarrhea, constipation, pain near the anus) °TENDERNESS IN MOUTH AND THROAT WITH OR WITHOUT PRESENCE OF ULCERS (sore throat, sores in mouth, or a toothache) °UNUSUAL RASH, SWELLING OR PAIN  °UNUSUAL VAGINAL DISCHARGE OR ITCHING  ° °Items with * indicate a potential emergency and should be followed up as soon as possible or go to the Emergency Department if any problems should occur. ° °Please show the CHEMOTHERAPY ALERT CARD or IMMUNOTHERAPY ALERT CARD at check-in to the  Emergency Department and triage nurse. ° °Should you have questions after your visit or need to cancel or reschedule your appointment, please contact [facility]ICAL [facility]COL[facility]  Dept: [phone]  and follow the prompts.  Office hours are 8:00 a.m. to 4:30 p.m. Monday - Friday. Please note that voicemails left after 4:00 p.m. may not be returned until the following business day.  We are closed weekends and major holidays. You have access to a nurse at all times for urgent questions. Please call the main number to the clinic Dept: [phone] and follow the prompts. ° ° °For any non-urgent questions, you may also contact your provider using MyChart. We now offer e-Visits for anyone 18 and older to request care online for non-urgent symptoms. For details visit [email or web address]. °  °Also download the MyChart app! Go to the app store, search "MyChart", open the app, select [facility], and log in with your MyChart username and password. ° °Due to Covid, a mask is required upon entering the hospital/clinic. If you do not have a mask, one will be given to you upon arrival. For doctor visits, patients may have 1 support person aged 18 or older with them. For treatment visits, patients cannot have anyone with them due to current Covid guidelines and our immunocompromised population.  ° °

## 2021-02-17 ENCOUNTER — Ambulatory Visit: Payer: 59

## 2021-02-17 ENCOUNTER — Ambulatory Visit
Admission: RE | Admit: 2021-02-17 | Discharge: 2021-02-17 | Disposition: A | Discharge status: Home / Self Care | Payer: 59 | Source: Ambulatory Visit | Attending: Radiation Oncology | Admitting: Radiation Oncology

## 2021-02-17 ENCOUNTER — Other Ambulatory Visit: Payer: Self-pay

## 2021-02-17 DIAGNOSIS — C50311 Malignant neoplasm of lower-inner quadrant of right female breast: Secondary | ICD-10-CM

## 2021-02-17 DIAGNOSIS — M25611 Stiffness of right shoulder, not elsewhere classified: Secondary | ICD-10-CM

## 2021-02-17 DIAGNOSIS — C50211 Malignant neoplasm of upper-inner quadrant of right female breast: Secondary | ICD-10-CM | POA: Diagnosis not present

## 2021-02-17 DIAGNOSIS — M25612 Stiffness of left shoulder, not elsewhere classified: Secondary | ICD-10-CM

## 2021-02-17 DIAGNOSIS — R293 Abnormal posture: Secondary | ICD-10-CM

## 2021-02-17 DIAGNOSIS — Z483 Aftercare following surgery for neoplasm: Secondary | ICD-10-CM

## 2021-02-17 LAB — CALCIUM, IONIZED: Calcium, Ionized, Serum: 4.9 mg/dL (ref 4.5–5.6)

## 2021-02-17 NOTE — Patient Instructions (Signed)
Over Head Pull: Narrow and Wide Grip   Cancer Rehab [phone]   On back, knees bent, feet flat, band across thighs, elbows straight but relaxed. Pull hands apart (start). Keeping elbows straight, bring arms up and over head, hands toward floor. Keep pull steady on band. Hold momentarily. Return slowly, keeping pull steady, back to start. Then do same with a wider grip on the band (past shoulder width) Repeat _5-10__ times. Band color __yellow____   Side Pull: Double Arm   On back, knees bent, feet flat. Arms perpendicular to body, shoulder level, elbows straight but relaxed. Pull arms out to sides, elbows straight. Resistance band comes across collarbones, hands toward floor. Hold momentarily. Slowly return to starting position. Repeat _5-10__ times. Band color _yellow____   [facility] and end  with thumbs up  On back, knees bent, feet flat, left hand on left hip, right hand above left. Pull right arm DIAGONALLY (hip to shoulder) across chest. Bring right arm along head toward floor. Hold momentarily. Slowly return to starting position. Repeat _5-10__ times. Do with left arm. Band color _yellow_____   Shoulder Rotation: Double Arm  Go halfway, about 45 degrees  On back, knees bent, feet flat, elbows tucked at sides, bent 90, hands palms up. Pull hands apart and down toward floor, keeping elbows near sides. Hold momentarily. Slowly return to starting position. Repeat _5-10__ times. Band color __yellow____

## 2021-02-17 NOTE — Therapy (Signed)
[facility] @ [place], [place], [address] Phone: [phone]   Fax:  [phone]  Physical Therapy Treatment  Patient Details  Name: Kayla Little MRN: [id number] Date of Birth: 01/21/1986 Referring Provider (PT): Dr. Autumn Messing   Encounter Date: 02/17/2021   PT End of Session - 02/17/21 1408     Visit Number 6    Number of Visits 10    Date for PT Re-Evaluation 03/05/21    PT Start Time 2263    PT Stop Time 1450    PT Time Calculation (min) 46 min    Activity Tolerance Patient tolerated treatment well    Behavior During Therapy [facility] for tasks assessed/performed             Past Medical History:  Diagnosis Date   ADD (attention deficit disorder)    Breast cancer (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)    Hx of varicella    Kidney stone 2012   Medullary thyroid carcinoma ([place])    Monoallelic mutation of CHEK2 gene in female patient    Postpartum care following vaginal delivery (7/27) 10/02/2014, June 16 , 2020   Second degree perineal laceration 08/23/2018   SVD (spontaneous vaginal delivery) 08/23/2018   Thyroid cancer ([place]) 2021    Past Surgical History:  Procedure Laterality Date   IR IMAGING GUIDED PORT INSERTION  07/22/2020   MODIFIED MASTECTOMY Right 01/07/2021   Procedure: RIGHT MODIFIED RADICAL MASTECTOMY;  Surgeon: Jovita Kussmaul, MD;  Location: [place];  Service: General;  Laterality: Right;   NO PAST SURGERIES     SIMPLE MASTECTOMY WITH AXILLARY SENTINEL NODE BIOPSY Left 01/07/2021   Procedure: LEFT SIMPLE MASTECTOMY;  Surgeon: Jovita Kussmaul, MD;  Location: [facility];  Service: General;  Laterality: Left;   THYROIDECTOMY N/A 04/12/2019    Procedure: TOTAL THYROIDECTOMY WITH LIMITED LYMPH NODE DISSECTION;  Surgeon: Armandina Gemma, MD;  [facility];  Service: General;  Laterality: N/A;   UNILATERAL SALPINGECTOMY Right 07/09/2017   Procedure: RIGHT SALPINGECTOMY WITH REMOVAL OF ECTOPIC PREGNANCY;  Surgeon: Azucena Fallen, MD;  Location: [facility];  Service: Gynecology;  Laterality: Right;    There were no vitals filed for this visit.   Subjective Assessment - 02/17/21 1403     Subjective Have had 3 radiation treatments and it is going well. Have some tightness in the forearm but it feels better since she worked on it.    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 She underwent neoadjuvant chemotherapy from 07/23/2020-11/06/2020 followed by a bilateral mastectomy with a right axillary lymph node dissection (1/15 nodes positive). She has a hx of medullary thyroid cancer with a thyroidectomy 04/12/2019. She smokes cigarettes infrequently and drinks red wine daily with varying amounts 0-3 glasses.    Patient Stated Goals Get my arms ack to normal    Currently in Pain? No/denies    Pain Score 0-No pain  [facility] Adult PT Treatment/Exercise - 02/17/21 0001       Shoulder Exercises: Supine   Horizontal ABduction Strengthening;Both;10 reps    Theraband Level (Shoulder Horizontal ABduction) Level 1 (Yellow)    External Rotation Strengthening;Both;5 reps    Theraband Level (Shoulder External Rotation) Level 1 (Yellow)    Flexion Strengthening;Both;5 reps    Theraband Level (Shoulder Flexion) Level 1 (Yellow)    Diagonals Strengthening;Both;5 reps    Theraband Level (Shoulder Diagonals) Level 1 (Yellow)      Manual Therapy   Soft tissue mobilization to right pectorals, lats, UT supine to decrease tissue tension    Myofascial Release to the Rt axilla and upper arm following cords with opposite pressure  and S stroke pressure    Passive ROM to Rt shoulder into flexion, abd, and ER with and without axillary blocking for cord stretch. PROM check on left in all ROM                     PT Education - 02/17/21 1445     Education Details supine scapular series x 5 reps    Person(s) Educated Patient    Methods Explanation;Handout    Comprehension Returned demonstration                 PT Long Term Goals - 02/05/21 1242       PT LONG TERM GOAL #1   Title Patient will demonstrate she has regained full shoulder ROM and function post operatively compared to baselines.    Time 4    Period Weeks    Status New    Target Date 03/05/21      PT LONG TERM GOAL #2   Title Patient will increase right shoulder AROM to be >/= 145 degrees for increased ease reaching.    Baseline 149 pre-op and 128 post op    Time 4    Period Weeks    Status New    Target Date 03/05/21      PT LONG TERM GOAL #3   Title Patient will increase right shoulder AROM abduction to >/= 160 degrees for increased ease obtaining radiation positioning.    Baseline 164 pre op and 112 post op    Time 4    Period Weeks    Status New    Target Date 03/05/21      PT LONG TERM GOAL #4   Title Patient will improve her DASH score to zero for right UE function to return to baseline.    Time 4    Period Weeks    Status New    Target Date 03/05/21      PT LONG TERM GOAL #5   Title Patient will verbalize good understanding of lymphedema risk reduction.    Time 4    Period Weeks    Status New    Target Date 03/05/21                   Plan - 02/17/21 1452     Clinical Impression Statement Pt is going to radiation from therapy so no lotions used today. Continued MFR techniques to cording in right axilla and forearm,gentle MFR to bilateral chest region and PROM bilateral shoulders. Pt was instructed in supine scapular series with yellow x 5 reps ea. She did well without any complaints of pain.   Modified ROM for comfort and did sword with thumbs up position only. Cording still present in right UE upper arm  and forearm. 1 small pop noted with MFR techniques today.    Stability/Clinical Decision Making Stable/Uncomplicated    Rehab Potential Good    PT Frequency 2x / week    PT Duration 4 weeks    PT Treatment/Interventions ADLs/Self Care Home Management;Therapeutic exercise;Patient/family education;Manual techniques;Manual lymph drainage;Passive range of motion;Scar mobilization    PT Next Visit Plan Check ROM,Continue myofascial release for cording, PROM, pulleys    PT Home Exercise Plan Post op HEP    Consulted and Agree with Plan of Care Patient             Patient will benefit from skilled therapeutic intervention in order to improve the following deficits and impairments:  Postural dysfunction, Decreased range of motion, Decreased knowledge of precautions, Impaired UE functional use, Pain, Increased fascial restricitons  Visit Diagnosis: Malignant neoplasm of lower-inner quadrant of right breast of female, estrogen receptor positive (HCC)  Abnormal posture  Stiffness of right shoulder, not elsewhere classified  Stiffness of left shoulder, not elsewhere classified  Aftercare following surgery for neoplasm     Problem List Patient Active Problem List   Diagnosis Date Noted   Anxiety 01/23/2021   Attention deficit disorder 01/23/2021   History of medullary carcinoma of thyroid 01/23/2021   History of substance abuse ([place]) 01/23/2021   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   Prolonged QT interval 07/30/2020   Bone lesion 07/23/2020   MEN 2A (multiple endocrine neoplasia, type 2A) ([place]) 04/12/2019   Multiple thyroid nodules 04/08/2019   Family history of breast cancer    Family history of prostate cancer    Family history of lymphoma    Family history of ovarian cancer    Monoallelic mutation of CHEK2 gene in female  patient    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

## 2021-02-18 ENCOUNTER — Ambulatory Visit
Admission: RE | Admit: 2021-02-18 | Discharge: 2021-02-18 | Disposition: A | Discharge status: Home / Self Care | Payer: 59 | Source: Ambulatory Visit | Attending: Radiation Oncology | Admitting: Radiation Oncology

## 2021-02-18 DIAGNOSIS — C50211 Malignant neoplasm of upper-inner quadrant of right female breast: Secondary | ICD-10-CM | POA: Diagnosis not present

## 2021-02-19 ENCOUNTER — Ambulatory Visit
Admission: RE | Admit: 2021-02-19 | Discharge: 2021-02-19 | Disposition: A | Discharge status: Home / Self Care | Payer: 59 | Source: Ambulatory Visit | Attending: Radiation Oncology | Admitting: Radiation Oncology

## 2021-02-19 ENCOUNTER — Other Ambulatory Visit: Payer: Self-pay

## 2021-02-19 ENCOUNTER — Ambulatory Visit: Payer: 59

## 2021-02-19 DIAGNOSIS — C50211 Malignant neoplasm of upper-inner quadrant of right female breast: Secondary | ICD-10-CM | POA: Diagnosis not present

## 2021-02-19 DIAGNOSIS — Z483 Aftercare following surgery for neoplasm: Secondary | ICD-10-CM

## 2021-02-19 DIAGNOSIS — M25612 Stiffness of left shoulder, not elsewhere classified: Secondary | ICD-10-CM

## 2021-02-19 DIAGNOSIS — M25611 Stiffness of right shoulder, not elsewhere classified: Secondary | ICD-10-CM

## 2021-02-19 DIAGNOSIS — R293 Abnormal posture: Secondary | ICD-10-CM

## 2021-02-19 DIAGNOSIS — Z170 Estrogen receptor positive status [ER+]: Secondary | ICD-10-CM

## 2021-02-19 NOTE — Therapy (Signed)
[facility] @ [place], [place], [address] Phone: [phone]   Fax:  [phone]  Physical Therapy Treatment  Patient Details  Name: Kayla Little MRN: [id number] Date of Birth: 1986/03/06 Referring Provider (PT): Dr. Autumn Messing   Encounter Date: 02/19/2021   PT End of Session - 02/19/21 1440     Visit Number 7    Number of Visits 10    Date for PT Re-Evaluation 03/05/21    PT Start Time 1401    PT Stop Time 1450    PT Time Calculation (min) 49 min    Activity Tolerance Patient tolerated treatment well    Behavior During Therapy [facility] for tasks assessed/performed             Past Medical History:  Diagnosis Date   ADD (attention deficit disorder)    Breast cancer ([place]) 06/30/2020   Breast lump    R breast   Depression    Family history of breast cancer    Family history of lymphoma    Family history of ovarian cancer    Family history of prostate cancer    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)    Hx of varicella    Kidney stone 2012   Medullary thyroid carcinoma ([place])    Monoallelic mutation of CHEK2 gene in female patient    Postpartum care following vaginal delivery (7/27) 10/02/2014, June 16 , 2020   Second degree perineal laceration 08/23/2018   SVD (spontaneous vaginal delivery) 08/23/2018   Thyroid cancer ([place]) 2021    Past Surgical History:  Procedure Laterality Date   IR IMAGING GUIDED PORT INSERTION  07/22/2020   MODIFIED MASTECTOMY Right 01/07/2021   Procedure: RIGHT MODIFIED RADICAL MASTECTOMY;  Surgeon: Jovita Kussmaul, MD;  Location: [facility];  Service: General;  Laterality: Right;   NO PAST SURGERIES     SIMPLE MASTECTOMY WITH AXILLARY SENTINEL NODE BIOPSY Left 01/07/2021   Procedure: LEFT SIMPLE MASTECTOMY;  Surgeon: Jovita Kussmaul, MD;  Location: [facility];  Service: General;  Laterality: Left;   THYROIDECTOMY N/A 04/12/2019    Procedure: TOTAL THYROIDECTOMY WITH LIMITED LYMPH NODE DISSECTION;  Surgeon: Armandina Gemma, MD;  [facility];  Service: General;  Laterality: N/A;   UNILATERAL SALPINGECTOMY Right 07/09/2017   Procedure: RIGHT SALPINGECTOMY WITH REMOVAL OF ECTOPIC PREGNANCY;  Surgeon: Azucena Fallen, MD;  Location: [facility];  Service: Gynecology;  Laterality: Right;    There were no vitals filed for this visit.   Subjective Assessment - 02/19/21 1401     Subjective Radiation is going well.  Cording remains about the same. Still feel all the way from axilla to wrist with wall slides.    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 She underwent neoadjuvant chemotherapy from 07/23/2020-11/06/2020 followed by a bilateral mastectomy with a right axillary lymph node dissection (1/15 nodes positive). She has a hx of medullary thyroid cancer with a thyroidectomy 04/12/2019. She smokes cigarettes infrequently and drinks red wine daily with varying amounts 0-3 glasses.    Patient Stated Goals Get my arms back to normal    Currently in Pain? No/denies    Pain Score 0-No pain    Pain Location Axilla    Pain Orientation Right  [facility] Adult PT Treatment/Exercise - 02/19/21 0001       Shoulder Exercises: Supine   Horizontal ABduction Strengthening;Both;5 reps    Theraband Level (Shoulder Horizontal ABduction) Level 1 (Yellow)    External Rotation Strengthening;5 reps    Theraband Level (Shoulder External Rotation) Level 1 (Yellow)    Flexion Strengthening;Both;5 reps    Theraband Level (Shoulder Flexion) Level 1 (Yellow)    Diagonals Strengthening;Both;5 reps    Theraband Level (Shoulder Diagonals) Level 1 (Yellow)      Shoulder Exercises: Therapy Ball   Flexion 5 reps;Both    ABduction Right;5 reps      Manual Therapy   Soft tissue mobilization to right pectorals, lats, UT  supine to decrease tissue tension    Myofascial Release to the Rt axilla and upper arm following cords with opposite pressure and S stroke pressure    Passive ROM to Rt shoulder into flexion, abd, and ER with and without axillary blocking for cord stretch. PROM check on left in all ROM                          PT Long Term Goals - 02/05/21 1242       PT LONG TERM GOAL #1   Title Patient will demonstrate she has regained full shoulder ROM and function post operatively compared to baselines.    Time 4    Period Weeks    Status New    Target Date 03/05/21      PT LONG TERM GOAL #2   Title Patient will increase right shoulder AROM to be >/= 145 degrees for increased ease reaching.    Baseline 149 pre-op and 128 post op    Time 4    Period Weeks    Status New    Target Date 03/05/21      PT LONG TERM GOAL #3   Title Patient will increase right shoulder AROM abduction to >/= 160 degrees for increased ease obtaining radiation positioning.    Baseline 164 pre op and 112 post op    Time 4    Period Weeks    Status New    Target Date 03/05/21      PT LONG TERM GOAL #4   Title Patient will improve her DASH score to zero for right UE function to return to baseline.    Time 4    Period Weeks    Status New    Target Date 03/05/21      PT LONG TERM GOAL #5   Title Patient will verbalize good understanding of lymphedema risk reduction.    Time 4    Period Weeks    Status New    Target Date 03/05/21                   Plan - 02/19/21 1441     Clinical Impression Statement Continued soft tissue mobilization, MFR techniques and PROM, as well as ROM and strengthening activities.  1 pop noted with MFR techniques.  Pt is progressing nicely with ROM and gentle strengthening exs.  Cording still fairly prominenet. Advised pt to order her sleeve since she had 15 LN removed    Stability/Clinical Decision Making Stable/Uncomplicated    Rehab Potential Good    PT  Frequency 2x / week    PT Duration 4 weeks    PT Treatment/Interventions ADLs/Self Care Home Management;Therapeutic exercise;Patient/family education;Manual techniques;Manual lymph drainage;Passive range of motion;Scar mobilization  PT Next Visit Plan Continue myofascial release for cording, PROM, pulleys, gentle strength. encouraged pt to order her sleeve    PT Home Exercise Plan Post op HEP, supine scap series x5 reps    Consulted and Agree with Plan of Care Patient             Patient will benefit from skilled therapeutic intervention in order to improve the following deficits and impairments:  Postural dysfunction, Decreased range of motion, Decreased knowledge of precautions, Impaired UE functional use, Pain, Increased fascial restricitons  Visit Diagnosis: Malignant neoplasm of lower-inner quadrant of right breast of female, estrogen receptor positive (HCC)  Abnormal posture  Stiffness of right shoulder, not elsewhere classified  Stiffness of left shoulder, not elsewhere classified  Aftercare following surgery for neoplasm     Problem List Patient Active Problem List   Diagnosis Date Noted   Anxiety 01/23/2021   Attention deficit disorder 01/23/2021   History of medullary carcinoma of thyroid 01/23/2021   History of substance abuse ([place]) 01/23/2021   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   Prolonged QT interval 07/30/2020   Bone lesion 07/23/2020   MEN 2A (multiple endocrine neoplasia, type 2A) ([place]) 04/12/2019   Multiple thyroid nodules 04/08/2019   Family history of breast cancer    Family history of prostate cancer    Family history of lymphoma    Family history of ovarian cancer    Monoallelic mutation of CHEK2 gene in female patient    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

## 2021-02-20 ENCOUNTER — Ambulatory Visit
Admission: RE | Admit: 2021-02-20 | Discharge: 2021-02-20 | Disposition: A | Discharge status: Home / Self Care | Payer: 59 | Source: Ambulatory Visit | Attending: Radiation Oncology | Admitting: Radiation Oncology

## 2021-02-20 DIAGNOSIS — C50211 Malignant neoplasm of upper-inner quadrant of right female breast: Secondary | ICD-10-CM | POA: Diagnosis not present

## 2021-02-23 ENCOUNTER — Ambulatory Visit
Admission: RE | Admit: 2021-02-23 | Discharge: 2021-02-23 | Disposition: A | Discharge status: Home / Self Care | Payer: 59 | Source: Ambulatory Visit | Attending: Radiation Oncology | Admitting: Radiation Oncology

## 2021-02-23 DIAGNOSIS — C50211 Malignant neoplasm of upper-inner quadrant of right female breast: Secondary | ICD-10-CM | POA: Diagnosis not present

## 2021-02-24 ENCOUNTER — Ambulatory Visit: Payer: 59 | Admitting: Rehabilitation

## 2021-02-24 ENCOUNTER — Ambulatory Visit
Admission: RE | Admit: 2021-02-24 | Discharge: 2021-02-24 | Disposition: A | Discharge status: Home / Self Care | Payer: 59 | Source: Ambulatory Visit | Attending: Radiation Oncology | Admitting: Radiation Oncology

## 2021-02-24 ENCOUNTER — Encounter: Payer: Self-pay | Admitting: Rehabilitation

## 2021-02-24 ENCOUNTER — Other Ambulatory Visit: Payer: Self-pay

## 2021-02-24 DIAGNOSIS — M25611 Stiffness of right shoulder, not elsewhere classified: Secondary | ICD-10-CM

## 2021-02-24 DIAGNOSIS — M25612 Stiffness of left shoulder, not elsewhere classified: Secondary | ICD-10-CM

## 2021-02-24 DIAGNOSIS — C50211 Malignant neoplasm of upper-inner quadrant of right female breast: Secondary | ICD-10-CM | POA: Diagnosis not present

## 2021-02-24 DIAGNOSIS — Z170 Estrogen receptor positive status [ER+]: Secondary | ICD-10-CM

## 2021-02-24 DIAGNOSIS — R293 Abnormal posture: Secondary | ICD-10-CM

## 2021-02-24 DIAGNOSIS — Z483 Aftercare following surgery for neoplasm: Secondary | ICD-10-CM

## 2021-02-24 NOTE — Therapy (Signed)
[facility] @ [place], [place], [address] Phone: [phone]   Fax:  [phone]  Physical Therapy Treatment  Patient Details  Name: Kayla Little MRN: [id number] Date of Birth: [birth year] Referring Provider (PT): Dr. Autumn Messing   Encounter Date: 02/24/2021   PT End of Session - 02/24/21 1446     Visit Number 8    Number of Visits 10    Date for PT Re-Evaluation 03/05/21    PT Start Time 1400    PT Stop Time 1446    PT Time Calculation (min) 46 min    Activity Tolerance Patient tolerated treatment well    Behavior During Therapy [facility] for tasks assessed/performed             Past Medical History:  Diagnosis Date   ADD (attention deficit disorder)    Breast cancer ([place]) 06/30/2020   Breast lump    R breast   Depression    Family history of breast cancer    Family history of lymphoma    Family history of ovarian cancer    Family history of prostate cancer    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)    Hx of varicella    Kidney stone 2012   Medullary thyroid carcinoma ([place])    Monoallelic mutation of CHEK2 gene in female patient    Postpartum care following vaginal delivery (7/27) 10/02/2014, June 16 , 2020   Second degree perineal laceration 08/23/2018   SVD (spontaneous vaginal delivery) 08/23/2018   Thyroid cancer ([place]) 2021    Past Surgical History:  Procedure Laterality Date   IR IMAGING GUIDED PORT INSERTION  07/22/2020   MODIFIED MASTECTOMY Right 01/07/2021   Procedure: RIGHT MODIFIED RADICAL MASTECTOMY;  Surgeon: Jovita Kussmaul, MD;  Location: [facility];  Service: General;  Laterality: Right;   NO PAST SURGERIES     SIMPLE MASTECTOMY WITH AXILLARY SENTINEL NODE BIOPSY Left 01/07/2021   Procedure: LEFT SIMPLE MASTECTOMY;  Surgeon: Jovita Kussmaul, MD;  Location: [facility];  Service: General;  Laterality: Left;   THYROIDECTOMY N/A 04/12/2019    Procedure: TOTAL THYROIDECTOMY WITH LIMITED LYMPH NODE DISSECTION;  Surgeon: Armandina Gemma, MD;  [facility];  Service: General;  Laterality: N/A;   UNILATERAL SALPINGECTOMY Right 07/09/2017   Procedure: RIGHT SALPINGECTOMY WITH REMOVAL OF ECTOPIC PREGNANCY;  Surgeon: Azucena Fallen, MD;  Location: [facility];  Service: Gynecology;  Laterality: Right;    There were no vitals filed for this visit.   Subjective Assessment - 02/24/21 1400     Subjective nothing new.  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 She underwent neoadjuvant chemotherapy from 07/23/2020-11/06/2020 followed by a bilateral mastectomy with a right axillary lymph node dissection (1/15 nodes positive). She has a hx of medullary thyroid cancer with a thyroidectomy 04/12/2019. She smokes cigarettes infrequently and drinks red wine daily with varying amounts 0-3 glasses.    Currently in Pain? No/denies                               [facility] Adult PT Treatment/Exercise - 02/24/21 0001       Shoulder Exercises: Pulleys   Flexion 2 minutes    Scaption 2 minutes  Shoulder Exercises: Therapy Ball   Flexion 5 reps;Both      Manual Therapy   Soft tissue mobilization held on STM due to radiation after this appt    Myofascial Release to the Rt axilla and upper arm following cords with opposite pressure and S stroke pressure    Passive ROM to Rt shoulder into flexion, abd, and ER with and without axillary blocking for cord stretch. PROM check on left in all ROM                          PT Long Term Goals - 02/05/21 1242       PT LONG TERM GOAL #1   Title Patient will demonstrate she has regained full shoulder ROM and function post operatively compared to baselines.    Time 4    Period Weeks    Status New    Target Date 03/05/21       PT LONG TERM GOAL #2   Title Patient will increase right shoulder AROM to be >/= 145 degrees for increased ease reaching.    Baseline 149 pre-op and 128 post op    Time 4    Period Weeks    Status New    Target Date 03/05/21      PT LONG TERM GOAL #3   Title Patient will increase right shoulder AROM abduction to >/= 160 degrees for increased ease obtaining radiation positioning.    Baseline 164 pre op and 112 post op    Time 4    Period Weeks    Status New    Target Date 03/05/21      PT LONG TERM GOAL #4   Title Patient will improve her DASH score to zero for right UE function to return to baseline.    Time 4    Period Weeks    Status New    Target Date 03/05/21      PT LONG TERM GOAL #5   Title Patient will verbalize good understanding of lymphedema risk reduction.    Time 4    Period Weeks    Status New    Target Date 03/05/21                   Plan - 02/24/21 1446     Clinical Impression Statement pt had heard from her own insurance that compression was covered but [employer] had stated her plan did not cover so Kathlee [place] was emailed again to verify.  otherwise pt may just go to a special place and submit her own insurance claim.    PT Frequency 2x / week    PT Duration 4 weeks    PT Treatment/Interventions ADLs/Self Care Home Management;Therapeutic exercise;Patient/family education;Manual techniques;Manual lymph drainage;Passive range of motion;Scar mobilization    PT Next Visit Plan Continue myofascial release for cording, PROM, pulleys, gentle strength. encouraged pt to order her sleeve             Patient will benefit from skilled therapeutic intervention in order to improve the following deficits and impairments:  Postural dysfunction, Decreased range of motion, Decreased knowledge of precautions, Impaired UE functional use, Pain, Increased fascial restricitons  Visit Diagnosis: Malignant neoplasm of lower-inner quadrant of right breast of female,  estrogen receptor positive (HCC)  Abnormal posture  Stiffness of right shoulder, not elsewhere classified  Stiffness of left shoulder, not elsewhere classified  Aftercare following surgery for neoplasm     Problem List  Patient Active Problem List   Diagnosis Date Noted   Anxiety 01/23/2021   Attention deficit disorder 01/23/2021   History of medullary carcinoma of thyroid 01/23/2021   History of substance abuse ([place]) 01/23/2021   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/23/2021   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   Prolonged QT interval 07/30/2020   Bone lesion 07/23/2020   MEN 2A (multiple endocrine neoplasia, type 2A) ([place]) 04/12/2019   Multiple thyroid nodules 04/08/2019   Family history of breast cancer    Family history of prostate cancer    Family history of lymphoma    Family history of ovarian cancer    Monoallelic mutation of CHEK2 gene in female patient    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

## 2021-02-25 ENCOUNTER — Ambulatory Visit
Admission: RE | Admit: 2021-02-25 | Discharge: 2021-02-25 | Disposition: A | Discharge status: Home / Self Care | Payer: 59 | Source: Ambulatory Visit | Attending: Radiation Oncology | Admitting: Radiation Oncology

## 2021-02-25 DIAGNOSIS — C50211 Malignant neoplasm of upper-inner quadrant of right female breast: Secondary | ICD-10-CM | POA: Diagnosis not present

## 2021-02-26 ENCOUNTER — Ambulatory Visit: Payer: 59

## 2021-02-26 ENCOUNTER — Other Ambulatory Visit: Payer: Self-pay

## 2021-02-26 ENCOUNTER — Ambulatory Visit
Admission: RE | Admit: 2021-02-26 | Discharge: 2021-02-26 | Disposition: A | Discharge status: Home / Self Care | Payer: 59 | Source: Ambulatory Visit | Attending: Radiation Oncology | Admitting: Radiation Oncology

## 2021-02-26 DIAGNOSIS — C50211 Malignant neoplasm of upper-inner quadrant of right female breast: Secondary | ICD-10-CM | POA: Diagnosis not present

## 2021-02-26 DIAGNOSIS — R293 Abnormal posture: Secondary | ICD-10-CM

## 2021-02-26 DIAGNOSIS — M25611 Stiffness of right shoulder, not elsewhere classified: Secondary | ICD-10-CM

## 2021-02-26 DIAGNOSIS — Z483 Aftercare following surgery for neoplasm: Secondary | ICD-10-CM

## 2021-02-26 DIAGNOSIS — M25612 Stiffness of left shoulder, not elsewhere classified: Secondary | ICD-10-CM

## 2021-02-26 DIAGNOSIS — Z170 Estrogen receptor positive status [ER+]: Secondary | ICD-10-CM

## 2021-02-26 NOTE — Therapy (Signed)
[facility] @ [place] [place], [place], [address] Phone: [phone]   Fax:  [phone]  Physical Therapy Treatment  Patient Details  Name: Kayla Little MRN: [id number] Date of Birth: 08-29-1985 Referring Provider (PT): Dr. Autumn Messing   Encounter Date: 02/26/2021   PT End of Session - 02/26/21 1423     Visit Number 9    Number of Visits 10    Date for PT Re-Evaluation 03/05/21    PT Start Time 1404    PT Stop Time 2706    PT Time Calculation (min) 51 min    Activity Tolerance Patient tolerated treatment well    Behavior During Therapy [facility] for tasks assessed/performed             Past Medical History:  Diagnosis Date   ADD (attention deficit disorder)    Breast cancer ([place]) 06/30/2020   Breast lump    R breast   Depression    Family history of breast cancer    Family history of lymphoma    Family history of ovarian cancer    Family history of prostate cancer    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)    Hx of varicella    Kidney stone 2012   Medullary thyroid carcinoma ([place])    Monoallelic mutation of CHEK2 gene in female patient    Postpartum care following vaginal delivery (7/27) 10/02/2014, June 16 , 2020   Second degree perineal laceration 08/23/2018   SVD (spontaneous vaginal delivery) 08/23/2018   Thyroid cancer ([place]) 2021    Past Surgical History:  Procedure Laterality Date   IR IMAGING GUIDED PORT INSERTION  07/22/2020   MODIFIED MASTECTOMY Right 01/07/2021   Procedure: RIGHT MODIFIED RADICAL MASTECTOMY;  Surgeon: Jovita Kussmaul, MD;  Location: [facility];  Service: General;  Laterality: Right;   NO PAST SURGERIES     SIMPLE MASTECTOMY WITH AXILLARY SENTINEL NODE BIOPSY Left 01/07/2021   Procedure: LEFT SIMPLE MASTECTOMY;  Surgeon: Jovita Kussmaul, MD;  Location: [facility];  Service: General;  Laterality: Left;   THYROIDECTOMY N/A 04/12/2019    Procedure: TOTAL THYROIDECTOMY WITH LIMITED LYMPH NODE DISSECTION;  Surgeon: Armandina Gemma, MD;  Location: [facility];  Service: General;  Laterality: N/A;   UNILATERAL SALPINGECTOMY Right 07/09/2017   Procedure: RIGHT SALPINGECTOMY WITH REMOVAL OF ECTOPIC PREGNANCY;  Surgeon: Azucena Fallen, MD;  Location: [facility];  Service: Gynecology;  Laterality: Right;    There were no vitals filed for this visit.   Subjective Assessment - 02/26/21 1409     Subjective I have been feeling better overall. I can empty the dishwasher now without any problem. I have mopped floors and used the magic eraser on the walls.   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 She underwent neoadjuvant chemotherapy from 07/23/2020-11/06/2020 followed by a bilateral mastectomy with a right axillary lymph node dissection (1/15 nodes positive). She has a hx of medullary thyroid cancer with a thyroidectomy 04/12/2019. She smokes cigarettes infrequently and drinks red wine daily with varying amounts 0-3 glasses.    Patient Stated Goals Get my arms back to normal    Currently in Pain? No/denies    Pain Score 0-No pain    Multiple Pain Sites No                [facility] PT Assessment - 02/26/21  0001       AROM   Right Shoulder Extension 60 Degrees    Right Shoulder Flexion 170 Degrees    Right Shoulder ABduction 180 Degrees    Right Shoulder Internal Rotation 77 Degrees    Right Shoulder External Rotation 103 Degrees    Left Shoulder Extension 50 Degrees    Left Shoulder Flexion 170 Degrees    Left Shoulder ABduction 180 Degrees    Left Shoulder Internal Rotation 75 Degrees    Left Shoulder External Rotation 98 Degrees                   Quick Dash - 02/26/21 0001     Open a tight or new jar Mild difficulty    Do heavy household chores (wash walls, wash floors) No difficulty    Carry a shopping bag or briefcase No difficulty     Wash your back Mild difficulty    Use a knife to cut food No difficulty    Recreational activities in which you take some force or impact through your arm, shoulder, or hand (golf, hammering, tennis) Moderate difficulty    During the past week, to what extent has your arm, shoulder or hand problem interfered with your normal social activities with family, friends, neighbors, or groups? Not at all    During the past week, to what extent has your arm, shoulder or hand problem limited your work or other regular daily activities Not at all    Arm, shoulder, or hand pain. None    Tingling (pins and needles) in your arm, shoulder, or hand None    Difficulty Sleeping Mild difficulty    DASH Score 11.36 %                    [facility] Adult PT Treatment/Exercise - 02/26/21 0001       Shoulder Exercises: Supine   Other Supine Exercises AROM bilateral shoulder flexion, scaption, horizontal abduction x 5      Shoulder Exercises: Pulleys   Flexion 2 minutes    Scaption 2 minutes      Manual Therapy   Soft tissue mobilization to pectoralis, lats in supine to decrease tissue tension    Myofascial Release to the Rt axilla and upper arm following cords with opposite pressure and S stroke pressure    Passive ROM to Rt shoulder into flexion, abd,D2 flex, and ER with and without axillary blocking for cord stretch.                          PT Long Term Goals - 02/26/21 1512       PT LONG TERM GOAL #1   Title Patient will demonstrate she has regained full shoulder ROM and function post operatively compared to baselines.    Time 4    Period Weeks    Status Achieved    Target Date 02/26/21      PT LONG TERM GOAL #2   Title Patient will increase right shoulder AROM to be >/= 145 degrees for increased ease reaching.    Time 4    Period Weeks    Status Achieved    Target Date 02/26/21      PT LONG TERM GOAL #3   Title Patient will increase right shoulder AROM abduction to >/=  160 degrees for increased ease obtaining radiation positioning.    Time 4    Period Weeks    Status  Achieved    Target Date 02/26/21      PT LONG TERM GOAL #4   Title Patient will improve her DASH score to zero for right UE function to return to baseline.    Time 4    Period Weeks    Status On-going    Target Date 03/05/21      PT LONG TERM GOAL #5   Title Patient will verbalize good understanding of lymphedema risk reduction.    Time 4    Period Weeks    Status On-going    Target Date 03/05/21                   Plan - 02/26/21 1502     Clinical Impression Statement Continued soft tissue mobilization, MFR, PROM and AROM of bilateral shoulders, gentle scar massage.  Assessed pts goals with pt demonstrating exxellent improvement in AROM bilaterally.  She has achieved ROM goals, but has not yet receive quick dash goal, and lymphedema precautions not yet assessed.  Pt will benefit from learning ABC strength handout.  After looking at pts Medical benefits it appears she must get her sleeve from an approved provider.  She will call Insurance to find someone in this area so she may get her sleeve ASAP.    Stability/Clinical Decision Making Stable/Uncomplicated    Rehab Potential Good    PT Frequency 2x / week    PT Duration 4 weeks    PT Treatment/Interventions ADLs/Self Care Home Management;Therapeutic exercise;Patient/family education;Manual techniques;Manual lymph drainage;Passive range of motion;Scar mobilization    PT Next Visit Plan recert for 1 more visit?ABC strength handout low repetitions, MFR for cording prn,did she find out about provider for sleeve?    PT Home Exercise Plan Post op HEP, supine scap series x5 reps    Consulted and Agree with Plan of Care Patient             Patient will benefit from skilled therapeutic intervention in order to improve the following deficits and impairments:  Postural dysfunction, Decreased range of motion, Decreased knowledge of  precautions, Impaired UE functional use, Pain, Increased fascial restricitons  Visit Diagnosis: Malignant neoplasm of lower-inner quadrant of right breast of female, estrogen receptor positive (HCC)  Abnormal posture  Stiffness of right shoulder, not elsewhere classified  Stiffness of left shoulder, not elsewhere classified  Aftercare following surgery for neoplasm     Problem List Patient Active Problem List   Diagnosis Date Noted   Anxiety 01/23/2021   Attention deficit disorder 01/23/2021   History of medullary carcinoma of thyroid 01/23/2021   History of substance abuse ([place]) 01/23/2021   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   Prolonged QT interval 07/30/2020   Bone lesion 07/23/2020   MEN 2A (multiple endocrine neoplasia, type 2A) ([place]) 04/12/2019   Multiple thyroid nodules 04/08/2019   Family history of breast cancer    Family history of prostate cancer    Family history of lymphoma    Family history of ovarian cancer    Monoallelic mutation of CHEK2 gene in female patient    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

## 2021-02-27 ENCOUNTER — Ambulatory Visit
Admission: RE | Admit: 2021-02-27 | Discharge: 2021-02-27 | Disposition: A | Discharge status: Home / Self Care | Payer: 59 | Source: Ambulatory Visit | Attending: Radiation Oncology | Admitting: Radiation Oncology

## 2021-02-27 DIAGNOSIS — C50211 Malignant neoplasm of upper-inner quadrant of right female breast: Secondary | ICD-10-CM | POA: Diagnosis not present

## 2021-03-03 ENCOUNTER — Ambulatory Visit: Payer: 59

## 2021-03-03 ENCOUNTER — Other Ambulatory Visit: Payer: Self-pay

## 2021-03-03 ENCOUNTER — Ambulatory Visit
Admission: RE | Admit: 2021-03-03 | Discharge: 2021-03-03 | Disposition: A | Discharge status: Home / Self Care | Payer: 59 | Source: Ambulatory Visit | Attending: Radiation Oncology | Admitting: Radiation Oncology

## 2021-03-03 DIAGNOSIS — C50211 Malignant neoplasm of upper-inner quadrant of right female breast: Secondary | ICD-10-CM | POA: Diagnosis not present

## 2021-03-03 DIAGNOSIS — Z483 Aftercare following surgery for neoplasm: Secondary | ICD-10-CM

## 2021-03-03 DIAGNOSIS — M25611 Stiffness of right shoulder, not elsewhere classified: Secondary | ICD-10-CM

## 2021-03-03 DIAGNOSIS — Z170 Estrogen receptor positive status [ER+]: Secondary | ICD-10-CM

## 2021-03-03 DIAGNOSIS — M25612 Stiffness of left shoulder, not elsewhere classified: Secondary | ICD-10-CM

## 2021-03-03 DIAGNOSIS — R293 Abnormal posture: Secondary | ICD-10-CM

## 2021-03-03 NOTE — Therapy (Signed)
[facility] @ [place], [place], [address] Phone: [phone]   Fax:  [phone]  Physical Therapy Treatment  Patient Details  Name: Kayla Little MRN: [id number] Date of Birth: 03/16/1985 Referring Provider (PT): Dr. Autumn Messing   Encounter Date: 03/03/2021   PT End of Session - 03/03/21 1458     Visit Number 10    Number of Visits 22    Date for PT Re-Evaluation 04/14/21    PT Start Time 7510    PT Stop Time 2585    PT Time Calculation (min) 46 min    Activity Tolerance Patient tolerated treatment well    Behavior During Therapy [facility] for tasks assessed/performed             Past Medical History:  Diagnosis Date   ADD (attention deficit disorder)    Breast cancer ([place]) 06/30/2020   Breast lump    R breast   Depression    Family history of breast cancer    Family history of lymphoma    Family history of ovarian cancer    Family history of prostate cancer    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)    Hx of varicella    Kidney stone 2012   Medullary thyroid carcinoma ([place])    Monoallelic mutation of CHEK2 gene in female patient    Postpartum care following vaginal delivery (7/27) 10/02/2014, June 16 , 2020   Second degree perineal laceration 08/23/2018   SVD (spontaneous vaginal delivery) 08/23/2018   Thyroid cancer ([place]) 2021    Past Surgical History:  Procedure Laterality Date   IR IMAGING GUIDED PORT INSERTION  07/22/2020   MODIFIED MASTECTOMY Right 01/07/2021   Procedure: RIGHT MODIFIED RADICAL MASTECTOMY;  Surgeon: Jovita Kussmaul, MD;  Location: [facility];  Service: General;  Laterality: Right;   NO PAST SURGERIES     SIMPLE MASTECTOMY WITH AXILLARY SENTINEL NODE BIOPSY Left 01/07/2021   Procedure: LEFT SIMPLE MASTECTOMY;  Surgeon: Jovita Kussmaul, MD;  Location: [facility];  Service: General;  Laterality: Left;   THYROIDECTOMY N/A 04/12/2019    Procedure: TOTAL THYROIDECTOMY WITH LIMITED LYMPH NODE DISSECTION;  Surgeon: Armandina Gemma, MD;  [facility];  Service: General;  Laterality: N/A;   UNILATERAL SALPINGECTOMY Right 07/09/2017   Procedure: RIGHT SALPINGECTOMY WITH REMOVAL OF ECTOPIC PREGNANCY;  Surgeon: Azucena Fallen, MD;  Location: [facility];  Service: Gynecology;  Laterality: Right;    There were no vitals filed for this visit.   Subjective Assessment - 03/03/21 1410     Subjective I spoke to [employer] and she said there are a number of places I can order  my sleeve from. Cording is still there but not too uncomfortable today.  I have radiation after this.   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 She underwent neoadjuvant chemotherapy from 07/23/2020-11/06/2020 followed by a bilateral mastectomy with a right axillary lymph node dissection (1/15 nodes positive). She has a hx of medullary thyroid cancer with a thyroidectomy 04/12/2019. She smokes cigarettes infrequently and drinks red wine daily with varying amounts 0-3 glasses.    Patient Stated Goals Get my arms back to normal    Currently in Pain? No/denies    Pain Score 0-No pain  [facility] Adult PT Treatment/Exercise - 03/03/21 0001       Exercises   Other Exercises  ABC strength handout flexibility ending at core strength. 1 rep ea with 15 sec holds bilaterally      Manual Therapy   Myofascial Release to the Rt axilla and upper arm following cords with opposite pressure and S stroke pressure    Passive ROM briefly to right shoulder. Good ROM noted                          PT Long Term Goals - 03/03/21 1732       PT LONG TERM GOAL #1   Title Patient will demonstrate she has regained full shoulder ROM and function post operatively compared to baselines.    Time 4    Period Weeks    Status Achieved     Target Date 02/26/21      PT LONG TERM GOAL #2   Title Patient will increase right shoulder AROM to be >/= 145 degrees for increased ease reaching.    Time 4    Period Weeks    Status Achieved      PT LONG TERM GOAL #3   Title Patient will increase right shoulder AROM abduction to >/= 160 degrees for increased ease obtaining radiation positioning.    Time 4    Status Achieved    Target Date 02/26/21      PT LONG TERM GOAL #4   Title Patient will improve her DASH score to zero for right UE function to return to baseline.    Time 4    Period Weeks    Status On-going    Target Date 04/14/21      PT LONG TERM GOAL #5   Title Patient will verbalize good understanding of lymphedema risk reduction.    Time 4    Period Weeks    Status Achieved    Target Date 03/03/21      PT LONG TERM GOAL #6   Title Pt will be independent in ABC strength program with reduced reps until she receives her sleeve    Time 6    Period Weeks    Status New    Target Date 04/14/21      PT LONG TERM GOAL #7   Title pt will recieve her compression sleeve/gauntlet and will be independent with donning and doffing.    Time 6    Period Weeks    Status New    Target Date 04/14/21                   Plan - 03/03/21 1738     Clinical Impression Statement Pt was reassessed today for recertification.  We assessed all pt goals and she is demonstrating excellent improvement in shoulder ROM. She has a good understanding of lymphedema precautions, but has not yet been able to find a supplier for her compression garments. She has not yet achieved her quick dash goal.  We initiated the [facility] handout today for stretching/flexibility but have not yet addressed strength. She continues to have cording present which can be uncomfortable at times but is not presently limiting her ROM. She will benefit from continued PT to address strength, soft tissue limitations,cording,and  check compression garments, We will continue  1-2 x/week decreasing frequency as she improves    Stability/Clinical Decision Making Stable/Uncomplicated    Rehab Potential Good    PT Frequency 2x /  week   1-2x/week   PT Duration 6 weeks    PT Treatment/Interventions ADLs/Self Care Home Management;Therapeutic exercise;Patient/family education;Manual techniques;Manual lymph drainage;Passive range of motion;Scar mobilization    PT Next Visit Plan MFR for cording prn, ABC strength starting next with core strength, list of suppliers for sleeve yet?    PT Home Exercise Plan Post op HEP, supine scap series x5 reps    Consulted and Agree with Plan of Care Patient             Patient will benefit from skilled therapeutic intervention in order to improve the following deficits and impairments:  Postural dysfunction, Decreased range of motion, Decreased knowledge of precautions, Impaired UE functional use, Pain, Increased fascial restricitons, Decreased scar mobility, Decreased strength  Visit Diagnosis: Malignant neoplasm of lower-inner quadrant of right breast of female, estrogen receptor positive (HCC)  Abnormal posture  Stiffness of right shoulder, not elsewhere classified  Stiffness of left shoulder, not elsewhere classified  Aftercare following surgery for neoplasm     Problem List Patient Active Problem List   Diagnosis Date Noted   Anxiety 01/23/2021   Attention deficit disorder 01/23/2021   History of medullary carcinoma of thyroid 01/23/2021   History of substance abuse ([place]) 01/23/2021   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   Prolonged QT interval 07/30/2020   Bone lesion 07/23/2020   MEN 2A (multiple endocrine neoplasia, type 2A) ([place]) 04/12/2019   Multiple thyroid nodules 04/08/2019   Family history of breast cancer    Family history of prostate cancer    Family history of lymphoma    Family history of ovarian cancer    Monoallelic mutation of CHEK2 gene in female  patient    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

## 2021-03-04 ENCOUNTER — Ambulatory Visit
Admission: RE | Admit: 2021-03-04 | Discharge: 2021-03-04 | Disposition: A | Discharge status: Home / Self Care | Payer: 59 | Source: Ambulatory Visit | Attending: Radiation Oncology | Admitting: Radiation Oncology

## 2021-03-04 DIAGNOSIS — C50211 Malignant neoplasm of upper-inner quadrant of right female breast: Secondary | ICD-10-CM | POA: Diagnosis not present

## 2021-03-05 ENCOUNTER — Ambulatory Visit: Payer: 59

## 2021-03-05 ENCOUNTER — Ambulatory Visit
Admission: RE | Admit: 2021-03-05 | Discharge: 2021-03-05 | Disposition: A | Discharge status: Home / Self Care | Payer: 59 | Source: Ambulatory Visit | Attending: Radiation Oncology | Admitting: Radiation Oncology

## 2021-03-05 ENCOUNTER — Other Ambulatory Visit: Payer: Self-pay

## 2021-03-05 DIAGNOSIS — M25612 Stiffness of left shoulder, not elsewhere classified: Secondary | ICD-10-CM

## 2021-03-05 DIAGNOSIS — C50211 Malignant neoplasm of upper-inner quadrant of right female breast: Secondary | ICD-10-CM | POA: Diagnosis not present

## 2021-03-05 DIAGNOSIS — R293 Abnormal posture: Secondary | ICD-10-CM

## 2021-03-05 DIAGNOSIS — Z483 Aftercare following surgery for neoplasm: Secondary | ICD-10-CM

## 2021-03-05 DIAGNOSIS — M25611 Stiffness of right shoulder, not elsewhere classified: Secondary | ICD-10-CM

## 2021-03-05 DIAGNOSIS — Z170 Estrogen receptor positive status [ER+]: Secondary | ICD-10-CM

## 2021-03-05 DIAGNOSIS — C50311 Malignant neoplasm of lower-inner quadrant of right female breast: Secondary | ICD-10-CM

## 2021-03-05 NOTE — Therapy (Signed)
[facility] @ [place], [place], [address] Phone: [phone]   Fax:  [phone]  Physical Therapy Treatment  Patient Details  Name: Kayla Little MRN: [id number] Date of Birth: [birth year] Referring Provider (PT): Dr. Autumn Messing   Encounter Date: 03/05/2021   PT End of Session - 03/05/21 1457     Visit Number 11    Number of Visits 22    Date for PT Re-Evaluation 04/14/21    PT Start Time 2694    PT Stop Time 8546    PT Time Calculation (min) 52 min    Activity Tolerance Patient tolerated treatment well    Behavior During Therapy [facility] for tasks assessed/performed             Past Medical History:  Diagnosis Date   ADD (attention deficit disorder)    Breast cancer ([place]) 06/30/2020   Breast lump    R breast   Depression    Family history of breast cancer    Family history of lymphoma    Family history of ovarian cancer    Family history of prostate cancer    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)    Hx of varicella    Kidney stone 2012   Medullary thyroid carcinoma ([place])    Monoallelic mutation of CHEK2 gene in female patient    Postpartum care following vaginal delivery (7/27) 10/02/2014, June 16 , 2020   Second degree perineal laceration 08/23/2018   SVD (spontaneous vaginal delivery) 08/23/2018   Thyroid cancer ([place]) 2021    Past Surgical History:  Procedure Laterality Date   IR IMAGING GUIDED PORT INSERTION  07/22/2020   MODIFIED MASTECTOMY Right 01/07/2021   Procedure: RIGHT MODIFIED RADICAL MASTECTOMY;  Surgeon: Jovita Kussmaul, MD;  Location: [facility];  Service: General;  Laterality: Right;   NO PAST SURGERIES     SIMPLE MASTECTOMY WITH AXILLARY SENTINEL NODE BIOPSY Left 01/07/2021   Procedure: LEFT SIMPLE MASTECTOMY;  Surgeon: Jovita Kussmaul, MD;  Location: [facility];  Service: General;  Laterality: Left;   THYROIDECTOMY N/A 04/12/2019    Procedure: TOTAL THYROIDECTOMY WITH LIMITED LYMPH NODE DISSECTION;  Surgeon: Armandina Gemma, MD;  [facility];  Service: General;  Laterality: N/A;   UNILATERAL SALPINGECTOMY Right 07/09/2017   Procedure: RIGHT SALPINGECTOMY WITH REMOVAL OF ECTOPIC PREGNANCY;  Surgeon: Azucena Fallen, MD;  Location: [facility];  Service: Gynecology;  Laterality: Right;    There were no vitals filed for this visit.   Subjective Assessment - 03/05/21 1403     Subjective Insurance sent me the information so I can get the sleeve at [place] is still there, but I haven't stretched yet today.    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 She underwent neoadjuvant chemotherapy from 07/23/2020-11/06/2020 followed by a bilateral mastectomy with a right axillary lymph node dissection (1/15 nodes positive). She has a hx of medullary thyroid cancer with a thyroidectomy 04/12/2019. She smokes cigarettes infrequently and drinks red wine daily with varying amounts 0-3 glasses.    Patient Stated Goals Get my arms back to normal    Currently in Pain? No/denies    Pain Score 0-No pain  [facility] Adult PT Treatment/Exercise - 03/05/21 0001       Exercises   Other Exercises  ABC strngth handout; superwoman to shoulder elevation. Wt exercises with 2 #, all x 5 reps except elevation 5 reps with 1#      Manual Therapy   Soft tissue mobilization pt going to [facility] to get sleeve today    Myofascial Release to the Rt axilla and upper arm following cords with opposite pressure and S stroke pressure    Passive ROM briefly to right shoulder. Good ROM noted                          PT Long Term Goals - 03/03/21 1732       PT LONG TERM GOAL #1   Title Patient will demonstrate she has regained full shoulder ROM and function post operatively compared to baselines.     Time 4    Period Weeks    Status Achieved    Target Date 02/26/21      PT LONG TERM GOAL #2   Title Patient will increase right shoulder AROM to be >/= 145 degrees for increased ease reaching.    Time 4    Period Weeks    Status Achieved      PT LONG TERM GOAL #3   Title Patient will increase right shoulder AROM abduction to >/= 160 degrees for increased ease obtaining radiation positioning.    Time 4    Status Achieved    Target Date 02/26/21      PT LONG TERM GOAL #4   Title Patient will improve her DASH score to zero for right UE function to return to baseline.    Time 4    Period Weeks    Status On-going    Target Date 04/14/21      PT LONG TERM GOAL #5   Title Patient will verbalize good understanding of lymphedema risk reduction.    Time 4    Period Weeks    Status Achieved    Target Date 03/03/21      PT LONG TERM GOAL #6   Title Pt will be independent in ABC strength program with reduced reps until she receives her sleeve    Time 6    Period Weeks    Status New    Target Date 04/14/21      PT LONG TERM GOAL #7   Title pt will recieve her compression sleeve/gauntlet and will be independent with donning and doffing.    Time 6    Period Weeks    Status New    Target Date 04/14/21                   Plan - 03/05/21 1457     Clinical Impression Statement Pt continues to have several small cords mainly in axilla.  She was progressed today through the [facility] strength handout from superwoman in quadruped through shoulder elevation. Perofrmed 5 reps of each and used 2 # for wt exercises except for elevation done with 1#. Pt is going to get her sleeve ordered at [facility] today. Making good progress.    Stability/Clinical Decision Making Stable/Uncomplicated    Rehab Potential Good    PT Frequency 2x / week    PT Duration 6 weeks    PT Treatment/Interventions ADLs/Self Care Home Management;Therapeutic exercise;Patient/family education;Manual  techniques;Manual lymph drainage;Passive range of motion;Scar mobilization    PT Next  Visit Plan MFR for cording prn, ABC strength starting next with core strength,get sleeve yet    PT Home Exercise Plan Post op HEP, supine scap series x5 reps    Consulted and Agree with Plan of Care Patient             Patient will benefit from skilled therapeutic intervention in order to improve the following deficits and impairments:  Postural dysfunction, Decreased range of motion, Decreased knowledge of precautions, Impaired UE functional use, Pain, Increased fascial restricitons, Decreased scar mobility, Decreased strength  Visit Diagnosis: Malignant neoplasm of lower-inner quadrant of right breast of female, estrogen receptor positive (HCC)  Abnormal posture  Stiffness of right shoulder, not elsewhere classified  Stiffness of left shoulder, not elsewhere classified  Aftercare following surgery for neoplasm     Problem List Patient Active Problem List   Diagnosis Date Noted   Anxiety 01/23/2021   Attention deficit disorder 01/23/2021   History of medullary carcinoma of thyroid 01/23/2021   History of substance abuse ([place]) 01/23/2021   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   Prolonged QT interval 07/30/2020   Bone lesion 07/23/2020   MEN 2A (multiple endocrine neoplasia, type 2A) ([place]) 04/12/2019   Multiple thyroid nodules 04/08/2019   Family history of breast cancer    Family history of prostate cancer    Family history of lymphoma    Family history of ovarian cancer    Monoallelic mutation of CHEK2 gene in female patient    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

## 2021-03-06 ENCOUNTER — Ambulatory Visit
Admission: RE | Admit: 2021-03-06 | Discharge: 2021-03-06 | Disposition: A | Discharge status: Home / Self Care | Payer: 59 | Source: Ambulatory Visit | Attending: Radiation Oncology | Admitting: Radiation Oncology

## 2021-03-06 DIAGNOSIS — C50211 Malignant neoplasm of upper-inner quadrant of right female breast: Secondary | ICD-10-CM | POA: Diagnosis not present

## 2021-03-10 ENCOUNTER — Ambulatory Visit
Admission: RE | Admit: 2021-03-10 | Discharge: 2021-03-10 | Disposition: A | Discharge status: Home / Self Care | Payer: 59 | Source: Ambulatory Visit | Attending: Radiation Oncology | Admitting: Radiation Oncology

## 2021-03-10 ENCOUNTER — Inpatient Hospital Stay: Payer: 59

## 2021-03-10 ENCOUNTER — Other Ambulatory Visit: Payer: Self-pay | Admitting: Hematology and Oncology

## 2021-03-10 ENCOUNTER — Ambulatory Visit (HOSPITAL_COMMUNITY)
Admission: RE | Admit: 2021-03-10 | Discharge: 2021-03-10 | Disposition: A | Discharge status: Home / Self Care | Payer: 59 | Source: Ambulatory Visit | Attending: Adult Health | Admitting: Adult Health

## 2021-03-10 ENCOUNTER — Other Ambulatory Visit: Payer: Self-pay

## 2021-03-10 VITALS — BP 124/81 | HR 82 | Temp 98.9°F | Resp 18 | Ht 66.0 in | Wt 161.2 lb

## 2021-03-10 DIAGNOSIS — Z95828 Presence of other vascular implants and grafts: Secondary | ICD-10-CM | POA: Insufficient documentation

## 2021-03-10 DIAGNOSIS — Z483 Aftercare following surgery for neoplasm: Secondary | ICD-10-CM | POA: Insufficient documentation

## 2021-03-10 DIAGNOSIS — R293 Abnormal posture: Secondary | ICD-10-CM | POA: Diagnosis present

## 2021-03-10 DIAGNOSIS — C50211 Malignant neoplasm of upper-inner quadrant of right female breast: Secondary | ICD-10-CM

## 2021-03-10 DIAGNOSIS — C50311 Malignant neoplasm of lower-inner quadrant of right female breast: Secondary | ICD-10-CM | POA: Insufficient documentation

## 2021-03-10 DIAGNOSIS — Z5112 Encounter for antineoplastic immunotherapy: Secondary | ICD-10-CM | POA: Insufficient documentation

## 2021-03-10 DIAGNOSIS — Z0189 Encounter for other specified special examinations: Secondary | ICD-10-CM | POA: Diagnosis not present

## 2021-03-10 DIAGNOSIS — E3122 Multiple endocrine neoplasia [MEN] type IIA: Secondary | ICD-10-CM

## 2021-03-10 DIAGNOSIS — Z170 Estrogen receptor positive status [ER+]: Secondary | ICD-10-CM

## 2021-03-10 DIAGNOSIS — Z01818 Encounter for other preprocedural examination: Secondary | ICD-10-CM | POA: Insufficient documentation

## 2021-03-10 DIAGNOSIS — M25612 Stiffness of left shoulder, not elsewhere classified: Secondary | ICD-10-CM | POA: Insufficient documentation

## 2021-03-10 DIAGNOSIS — M25611 Stiffness of right shoulder, not elsewhere classified: Secondary | ICD-10-CM | POA: Insufficient documentation

## 2021-03-10 LAB — CBC WITH DIFFERENTIAL/PLATELET
Abs Immature Granulocytes: 0.03 10*3/uL (ref 0.00–0.07)
Basophils Absolute: 0.1 10*3/uL (ref 0.0–0.1)
Basophils Relative: 1 %
Eosinophils Absolute: 0.1 10*3/uL (ref 0.0–0.5)
Eosinophils Relative: 2 %
HCT: 41.7 % (ref 36.0–46.0)
Hemoglobin: 14 g/dL (ref 12.0–15.0)
Immature Granulocytes: 0 %
Lymphocytes Relative: 17 %
Lymphs Abs: 1.5 10*3/uL (ref 0.7–4.0)
MCH: 33.1 pg (ref 26.0–34.0)
MCHC: 33.6 g/dL (ref 30.0–36.0)
MCV: 98.6 fL (ref 80.0–100.0)
Monocytes Absolute: 0.7 10*3/uL (ref 0.1–1.0)
Monocytes Relative: 8 %
Neutro Abs: 6.3 10*3/uL (ref 1.7–7.7)
Neutrophils Relative %: 72 %
Platelets: 273 10*3/uL (ref 150–400)
RBC: 4.23 MIL/uL (ref 3.87–5.11)
RDW: 12.8 % (ref 11.5–15.5)
WBC: 8.7 10*3/uL (ref 4.0–10.5)
nRBC: 0 % (ref 0.0–0.2)

## 2021-03-10 LAB — COMPREHENSIVE METABOLIC PANEL
ALT: 44 U/L (ref 0–44)
AST: 49 U/L — ABNORMAL HIGH (ref 15–41)
Albumin: 4.3 g/dL (ref 3.5–5.0)
Alkaline Phosphatase: 53 U/L (ref 38–126)
Anion gap: 8 (ref 5–15)
BUN: 12 mg/dL (ref 6–20)
CO2: 25 mmol/L (ref 22–32)
Calcium: 8.6 mg/dL — ABNORMAL LOW (ref 8.9–10.3)
Chloride: 103 mmol/L (ref 98–111)
Creatinine, Ser: 0.88 mg/dL (ref 0.44–1.00)
GFR, Estimated: >60 mL/min (ref >60)
Glucose, Bld: 100 mg/dL — ABNORMAL HIGH (ref 70–99)
Potassium: 3.9 mmol/L (ref 3.5–5.1)
Sodium: 136 mmol/L (ref 135–145)
Total Bilirubin: 0.5 mg/dL (ref 0.3–1.2)
Total Protein: 7.1 g/dL (ref 6.5–8.1)

## 2021-03-10 LAB — ECHOCARDIOGRAM COMPLETE
Area-P 1/2: 5.02 cm2
S' Lateral: 3.4 cm

## 2021-03-10 MED ORDER — SODIUM CHLORIDE 0.9 % IV SOLN
3.4000 mg/kg | Freq: Once | INTRAVENOUS | Status: AC
  Administered 2021-03-10: 260 mg via INTRAVENOUS
  Filled 2021-03-10: qty 8

## 2021-03-10 MED ORDER — SODIUM CHLORIDE 0.9 % IV SOLN: Freq: Once | INTRAVENOUS | Status: AC

## 2021-03-10 MED ORDER — HEPARIN SOD (PORK) LOCK FLUSH 100 UNIT/ML IV SOLN
500.0000 [IU] | Freq: Once | INTRAVENOUS | Status: AC | PRN
  Administered 2021-03-10: 500 [IU]

## 2021-03-10 MED ORDER — SODIUM CHLORIDE 0.9% FLUSH
10.0000 mL | Freq: Once | INTRAVENOUS | Status: AC
  Administered 2021-03-10: 10 mL

## 2021-03-10 MED ORDER — DIPHENHYDRAMINE HCL 25 MG PO CAPS
25.0000 mg | ORAL_CAPSULE | Freq: Once | ORAL | Status: AC
  Administered 2021-03-10: 25 mg via ORAL
  Filled 2021-03-10: qty 1

## 2021-03-10 MED ORDER — ACETAMINOPHEN 325 MG PO TABS
650.0000 mg | ORAL_TABLET | Freq: Once | ORAL | Status: AC
  Administered 2021-03-10: 650 mg via ORAL
  Filled 2021-03-10: qty 2

## 2021-03-10 MED ORDER — SODIUM CHLORIDE 0.9% FLUSH
10.0000 mL | INTRAVENOUS | Status: DC | PRN
  Administered 2021-03-10: 10 mL

## 2021-03-10 NOTE — Progress Notes (Signed)
Echocardiogram 2D Echocardiogram has been performed.  Kayla Little M 03/10/2021, 10:45 AM

## 2021-03-10 NOTE — Progress Notes (Signed)
Pt declined to stay for 30 min observation. VSS and pt ambulatory to radiation appointment. No further concerns at time of discharge.

## 2021-03-10 NOTE — Patient Instructions (Signed)
[facility] MEDICAL [facility]COLOGY  Discharge Instructions: °Thank you for choosing [facility] to provide your oncology and hematology care.  ° °If you have a lab appointment with the Cancer Center, please go directly to the Cancer Center and check in at the registration area. °  °Wear comfortable clothing and clothing appropriate for easy access to any Portacath or PICC line.  ° °We strive to give you quality time with your provider. You may need to reschedule your appointment if you arrive late (15 or more minutes).  Arriving late affects you and other patients whose appointments are after yours.  Also, if you miss three or more appointments without notifying the office, you may be dismissed from the clinic at the provider’s discretion.    °  °For prescription refill requests, have your pharmacy contact our office and allow 72 hours for refills to be completed.   ° °Today you received the following chemotherapy and/or immunotherapy agents [place]    °  °To help prevent nausea and vomiting after your treatment, we encourage you to take your nausea medication as directed. ° °BELOW ARE SYMPTOMS THAT SHOULD BE REPORTED IMMEDIATELY: °*FEVER GREATER THAN 100.4 F (38 °C) OR HIGHER °*CHILLS OR SWEATING °*NAUSEA AND VOMITING THAT IS NOT CONTROLLED WITH YOUR NAUSEA MEDICATION °*UNUSUAL SHORTNESS OF BREATH °*UNUSUAL BRUISING OR BLEEDING °*URINARY PROBLEMS (pain or burning when urinating, or frequent urination) °*BOWEL PROBLEMS (unusual diarrhea, constipation, pain near the anus) °TENDERNESS IN MOUTH AND THROAT WITH OR WITHOUT PRESENCE OF ULCERS (sore throat, sores in mouth, or a toothache) °UNUSUAL RASH, SWELLING OR PAIN  °UNUSUAL VAGINAL DISCHARGE OR ITCHING  ° °Items with * indicate a potential emergency and should be followed up as soon as possible or go to the Emergency Department if any problems should occur. ° °Please show the CHEMOTHERAPY ALERT CARD or IMMUNOTHERAPY ALERT CARD at check-in to the  Emergency Department and triage nurse. ° °Should you have questions after your visit or need to cancel or reschedule your appointment, please contact [facility]  Dept: [phone]  and follow the prompts.  Office hours are 8:00 a.m. to 4:30 p.m. Monday - Friday. Please note that voicemails left after 4:00 p.m. may not be returned until the following business day.  We are closed weekends and major holidays. You have access to a nurse at all times for urgent questions. Please call the main number to the clinic Dept: [phone] and follow the prompts. ° ° °For any non-urgent questions, you may also contact your provider using MyChart. We now offer e-Visits for anyone 18 and older to request care online for non-urgent symptoms. For details visit [email or web address]. °  °Also download the MyChart app! Go to the app store, search "MyChart", open the app, select [facility], and log in with your MyChart username and password. ° °Due to Covid, a mask is required upon entering the hospital/clinic. If you do not have a mask, one will be given to you upon arrival. For doctor visits, patients may have 1 support person aged 18 or older with them. For treatment visits, patients cannot have anyone with them due to current Covid guidelines and our immunocompromised population.  ° °

## 2021-03-10 NOTE — Progress Notes (Signed)
Per Dr. Lindi Adie- ok to treat today without pregnancy test. Last negative test was 3 weeks ago.

## 2021-03-11 ENCOUNTER — Ambulatory Visit
Admission: RE | Admit: 2021-03-11 | Discharge: 2021-03-11 | Disposition: A | Discharge status: Home / Self Care | Payer: 59 | Source: Ambulatory Visit | Attending: Radiation Oncology | Admitting: Radiation Oncology

## 2021-03-11 DIAGNOSIS — C50311 Malignant neoplasm of lower-inner quadrant of right female breast: Secondary | ICD-10-CM | POA: Diagnosis not present

## 2021-03-11 LAB — CALCIUM, IONIZED: Calcium, Ionized, Serum: 4.7 mg/dL (ref 4.5–5.6)

## 2021-03-12 ENCOUNTER — Ambulatory Visit
Admission: RE | Admit: 2021-03-12 | Discharge: 2021-03-12 | Disposition: A | Discharge status: Home / Self Care | Payer: 59 | Source: Ambulatory Visit | Attending: Radiation Oncology | Admitting: Radiation Oncology

## 2021-03-12 DIAGNOSIS — C50311 Malignant neoplasm of lower-inner quadrant of right female breast: Secondary | ICD-10-CM | POA: Diagnosis not present

## 2021-03-13 ENCOUNTER — Ambulatory Visit: Payer: 59 | Admitting: Radiation Oncology

## 2021-03-13 ENCOUNTER — Other Ambulatory Visit: Payer: Self-pay

## 2021-03-13 ENCOUNTER — Ambulatory Visit
Admission: RE | Admit: 2021-03-13 | Discharge: 2021-03-13 | Disposition: A | Discharge status: Home / Self Care | Payer: 59 | Source: Ambulatory Visit | Attending: Radiation Oncology | Admitting: Radiation Oncology

## 2021-03-13 DIAGNOSIS — C50311 Malignant neoplasm of lower-inner quadrant of right female breast: Secondary | ICD-10-CM | POA: Diagnosis not present

## 2021-03-16 ENCOUNTER — Ambulatory Visit
Admission: RE | Admit: 2021-03-16 | Discharge: 2021-03-16 | Disposition: A | Discharge status: Home / Self Care | Payer: 59 | Source: Ambulatory Visit | Attending: Radiation Oncology | Admitting: Radiation Oncology

## 2021-03-16 DIAGNOSIS — C50311 Malignant neoplasm of lower-inner quadrant of right female breast: Secondary | ICD-10-CM | POA: Diagnosis not present

## 2021-03-17 ENCOUNTER — Other Ambulatory Visit: Payer: Self-pay

## 2021-03-17 ENCOUNTER — Ambulatory Visit
Admission: RE | Admit: 2021-03-17 | Discharge: 2021-03-17 | Disposition: A | Discharge status: Home / Self Care | Payer: 59 | Source: Ambulatory Visit | Attending: Radiation Oncology | Admitting: Radiation Oncology

## 2021-03-17 DIAGNOSIS — C50311 Malignant neoplasm of lower-inner quadrant of right female breast: Secondary | ICD-10-CM | POA: Diagnosis not present

## 2021-03-18 ENCOUNTER — Ambulatory Visit
Admission: RE | Admit: 2021-03-18 | Discharge: 2021-03-18 | Disposition: A | Discharge status: Home / Self Care | Payer: 59 | Source: Ambulatory Visit | Attending: Radiation Oncology | Admitting: Radiation Oncology

## 2021-03-18 ENCOUNTER — Ambulatory Visit: Payer: 59 | Attending: General Surgery

## 2021-03-18 DIAGNOSIS — R293 Abnormal posture: Secondary | ICD-10-CM

## 2021-03-18 DIAGNOSIS — Z170 Estrogen receptor positive status [ER+]: Secondary | ICD-10-CM | POA: Insufficient documentation

## 2021-03-18 DIAGNOSIS — M25612 Stiffness of left shoulder, not elsewhere classified: Secondary | ICD-10-CM

## 2021-03-18 DIAGNOSIS — Z483 Aftercare following surgery for neoplasm: Secondary | ICD-10-CM

## 2021-03-18 DIAGNOSIS — C50311 Malignant neoplasm of lower-inner quadrant of right female breast: Secondary | ICD-10-CM | POA: Diagnosis not present

## 2021-03-18 DIAGNOSIS — M25611 Stiffness of right shoulder, not elsewhere classified: Secondary | ICD-10-CM

## 2021-03-18 NOTE — Therapy (Signed)
[facility] @ [place], [place], [address] Phone: [phone]   Fax:  [phone]  Physical Therapy Treatment  Patient Details  Name: Kayla Little MRN: [id number] Date of Birth: January 19, 1986 Referring Provider (PT): Dr. Autumn Messing   Encounter Date: 03/18/2021   PT End of Session - 03/18/21 1947     Visit Number 12    Number of Visits 22    Date for PT Re-Evaluation 04/14/21    PT Start Time 1416   pt late   PT Stop Time 1445    PT Time Calculation (min) 29 min    Activity Tolerance Patient tolerated treatment well    Behavior During Therapy [facility] for tasks assessed/performed             Past Medical History:  Diagnosis Date   ADD (attention deficit disorder)    Breast cancer ([place]) 06/30/2020   Breast lump    R breast   Depression    Family history of breast cancer    Family history of lymphoma    Family history of ovarian cancer    Family history of prostate cancer    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)    Hx of varicella    Kidney stone 2012   Medullary thyroid carcinoma ([place])    Monoallelic mutation of CHEK2 gene in female patient    Postpartum care following vaginal delivery (7/27) 10/02/2014, June 16 , 2020   Second degree perineal laceration 08/23/2018   SVD (spontaneous vaginal delivery) 08/23/2018   Thyroid cancer ([place]) 2021    Past Surgical History:  Procedure Laterality Date   IR IMAGING GUIDED PORT INSERTION  07/22/2020   MODIFIED MASTECTOMY Right 01/07/2021   Procedure: RIGHT MODIFIED RADICAL MASTECTOMY;  Surgeon: Jovita Kussmaul, MD;  Location: [facility];  Service: General;  Laterality: Right;   NO PAST SURGERIES     SIMPLE MASTECTOMY WITH AXILLARY SENTINEL NODE BIOPSY Left 01/07/2021   Procedure: LEFT SIMPLE MASTECTOMY;  Surgeon: Jovita Kussmaul, MD;  Location: [facility];  Service: General;  Laterality: Left;   THYROIDECTOMY N/A  04/12/2019   Procedure: TOTAL THYROIDECTOMY WITH LIMITED LYMPH NODE DISSECTION;  Surgeon: Armandina Gemma, MD;  [facility];  Service: General;  Laterality: N/A;   UNILATERAL SALPINGECTOMY Right 07/09/2017   Procedure: RIGHT SALPINGECTOMY WITH REMOVAL OF ECTOPIC PREGNANCY;  Surgeon: Azucena Fallen, MD;  Location: [facility];  Service: Gynecology;  Laterality: Right;    There were no vitals filed for this visit.   Subjective Assessment - 03/18/21 1945     Subjective I got my sleeve over the weekend but the hand piece is attached to it.  The hand feels tight and was making my fingers start to feel a little numb.  Cording is still there, but is feeling better. They have marked my chest for the boost treatment.  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 She underwent neoadjuvant chemotherapy from 07/23/2020-11/06/2020 followed by a bilateral mastectomy with a right axillary lymph node dissection (1/15 nodes positive). She has a hx of medullary thyroid cancer with a thyroidectomy 04/12/2019. She smokes cigarettes infrequently and drinks red wine daily with varying amounts 0-3 glasses.    Patient Stated Goals Get my arms back to normal    Currently in Pain? No/denies  Pain Score 0-No pain                               [facility] Adult PT Treatment/Exercise - 03/18/21 0001       Manual Therapy   Manual therapy comments assessed new sleeve/gauntlet combo.  Fits at upper arm, but tight at thumb/hand. suggested returning for separate sleeve/gauntlet    Myofascial Release MFR to right axillary region    Passive ROM PROM to right shoulder during MFR techniques to cording                          PT Long Term Goals - 03/03/21 1732       PT LONG TERM GOAL #1   Title Patient will demonstrate she has regained full shoulder ROM and function post operatively compared  to baselines.    Time 4    Period Weeks    Status Achieved    Target Date 02/26/21      PT LONG TERM GOAL #2   Title Patient will increase right shoulder AROM to be >/= 145 degrees for increased ease reaching.    Time 4    Period Weeks    Status Achieved      PT LONG TERM GOAL #3   Title Patient will increase right shoulder AROM abduction to >/= 160 degrees for increased ease obtaining radiation positioning.    Time 4    Status Achieved    Target Date 02/26/21      PT LONG TERM GOAL #4   Title Patient will improve her DASH score to zero for right UE function to return to baseline.    Time 4    Period Weeks    Status On-going    Target Date 04/14/21      PT LONG TERM GOAL #5   Title Patient will verbalize good understanding of lymphedema risk reduction.    Time 4    Period Weeks    Status Achieved    Target Date 03/03/21      PT LONG TERM GOAL #6   Title Pt will be independent in ABC strength program with reduced reps until she receives her sleeve    Time 6    Period Weeks    Status New    Target Date 04/14/21      PT LONG TERM GOAL #7   Title pt will recieve her compression sleeve/gauntlet and will be independent with donning and doffing.    Time 6    Period Weeks    Status New    Target Date 04/14/21                   Plan - 03/18/21 1948     Clinical Impression Statement Pt was late coming from another appt. She was instructed in the proper way to don her new sleeve.  The sleeve fit well but the thumb and hand piece did feel tight.  suggested she consider returning to the store to get the sleeve with separate gauntlet if they will return it. Continued MFR on right axillary cording and PROM.  Cording is greatly improved, but still present. Pts skin very red from radiation, but still intact.    Stability/Clinical Decision Making Stable/Uncomplicated    Rehab Potential Good    PT Frequency 2x / week    PT Duration 6 weeks    PT Treatment/Interventions  ADLs/Self Care Home Management;Therapeutic exercise;Patient/family education;Manual techniques;Manual lymph drainage;Passive range of motion;Scar mobilization    PT Next Visit Plan MFR for cording prn, ABC strength starting next with step ups,get sleeve returned for separate gauntlet?    PT Home Exercise Plan Post op HEP, supine scap series x5 reps    Consulted and Agree with Plan of Care Patient             Patient will benefit from skilled therapeutic intervention in order to improve the following deficits and impairments:  Postural dysfunction, Decreased range of motion, Decreased knowledge of precautions, Impaired UE functional use, Pain, Increased fascial restricitons, Decreased scar mobility, Decreased strength  Visit Diagnosis: Malignant neoplasm of lower-inner quadrant of right breast of female, estrogen receptor positive (HCC)  Abnormal posture  Stiffness of right shoulder, not elsewhere classified  Stiffness of left shoulder, not elsewhere classified  Aftercare following surgery for neoplasm     Problem List Patient Active Problem List   Diagnosis Date Noted   Port-A-Cath in place 03/10/2021   Anxiety 01/23/2021   Attention deficit disorder 01/23/2021   History of medullary carcinoma of thyroid 01/23/2021   History of substance abuse ([place]) 01/23/2021   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   Prolonged QT interval 07/30/2020   Bone lesion 07/23/2020   MEN 2A (multiple endocrine neoplasia, type 2A) ([place]) 04/12/2019   Multiple thyroid nodules 04/08/2019   Family history of breast cancer    Family history of prostate cancer    Family history of lymphoma    Family history of ovarian cancer    Monoallelic mutation of CHEK2 gene in female patient    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

## 2021-03-19 ENCOUNTER — Ambulatory Visit
Admission: RE | Admit: 2021-03-19 | Discharge: 2021-03-19 | Disposition: A | Discharge status: Home / Self Care | Payer: 59 | Source: Ambulatory Visit | Attending: Radiation Oncology | Admitting: Radiation Oncology

## 2021-03-19 DIAGNOSIS — C50311 Malignant neoplasm of lower-inner quadrant of right female breast: Secondary | ICD-10-CM | POA: Diagnosis not present

## 2021-03-20 ENCOUNTER — Ambulatory Visit
Admission: RE | Admit: 2021-03-20 | Discharge: 2021-03-20 | Disposition: A | Discharge status: Home / Self Care | Payer: 59 | Source: Ambulatory Visit | Attending: Radiation Oncology | Admitting: Radiation Oncology

## 2021-03-20 ENCOUNTER — Ambulatory Visit: Payer: 59 | Admitting: Radiation Oncology

## 2021-03-20 ENCOUNTER — Other Ambulatory Visit: Payer: Self-pay

## 2021-03-20 DIAGNOSIS — C50311 Malignant neoplasm of lower-inner quadrant of right female breast: Secondary | ICD-10-CM | POA: Diagnosis not present

## 2021-03-23 ENCOUNTER — Other Ambulatory Visit: Payer: Self-pay

## 2021-03-23 ENCOUNTER — Ambulatory Visit
Admission: RE | Admit: 2021-03-23 | Discharge: 2021-03-23 | Disposition: A | Discharge status: Home / Self Care | Payer: 59 | Source: Ambulatory Visit | Attending: Radiation Oncology | Admitting: Radiation Oncology

## 2021-03-23 DIAGNOSIS — C50311 Malignant neoplasm of lower-inner quadrant of right female breast: Secondary | ICD-10-CM | POA: Diagnosis not present

## 2021-03-24 ENCOUNTER — Ambulatory Visit
Admission: RE | Admit: 2021-03-24 | Discharge: 2021-03-24 | Disposition: A | Discharge status: Home / Self Care | Payer: 59 | Source: Ambulatory Visit | Attending: Radiation Oncology | Admitting: Radiation Oncology

## 2021-03-24 DIAGNOSIS — C50311 Malignant neoplasm of lower-inner quadrant of right female breast: Secondary | ICD-10-CM | POA: Diagnosis not present

## 2021-03-25 ENCOUNTER — Ambulatory Visit
Admission: RE | Admit: 2021-03-25 | Discharge: 2021-03-25 | Disposition: A | Discharge status: Home / Self Care | Payer: 59 | Source: Ambulatory Visit | Attending: Radiation Oncology | Admitting: Radiation Oncology

## 2021-03-25 DIAGNOSIS — C50311 Malignant neoplasm of lower-inner quadrant of right female breast: Secondary | ICD-10-CM | POA: Diagnosis not present

## 2021-03-26 ENCOUNTER — Ambulatory Visit
Admission: RE | Admit: 2021-03-26 | Discharge: 2021-03-26 | Disposition: A | Discharge status: Home / Self Care | Payer: 59 | Source: Ambulatory Visit | Attending: Radiation Oncology | Admitting: Radiation Oncology

## 2021-03-26 DIAGNOSIS — C50311 Malignant neoplasm of lower-inner quadrant of right female breast: Secondary | ICD-10-CM | POA: Diagnosis not present

## 2021-03-27 ENCOUNTER — Ambulatory Visit
Admission: RE | Admit: 2021-03-27 | Discharge: 2021-03-27 | Disposition: A | Discharge status: Home / Self Care | Payer: 59 | Source: Ambulatory Visit | Attending: Radiation Oncology | Admitting: Radiation Oncology

## 2021-03-27 DIAGNOSIS — C50311 Malignant neoplasm of lower-inner quadrant of right female breast: Secondary | ICD-10-CM | POA: Diagnosis not present

## 2021-03-30 ENCOUNTER — Other Ambulatory Visit: Payer: Self-pay

## 2021-03-30 ENCOUNTER — Ambulatory Visit
Admission: RE | Admit: 2021-03-30 | Discharge: 2021-03-30 | Disposition: A | Discharge status: Home / Self Care | Payer: 59 | Source: Ambulatory Visit | Attending: Radiation Oncology | Admitting: Radiation Oncology

## 2021-03-30 DIAGNOSIS — C50311 Malignant neoplasm of lower-inner quadrant of right female breast: Secondary | ICD-10-CM | POA: Diagnosis not present

## 2021-03-30 NOTE — Progress Notes (Signed)
Patient Care Team: [facility], [facility] as PCP - General (Family Medicine) Kyung Rudd, MD as Consulting Physician (Radiation Oncology) Armandina Gemma, MD as Consulting Physician (General Surgery) Rockwell Germany, RN as Registered Nurse Mauro Kaufmann, RN as Registered Nurse Nicholas Lose, MD as Consulting Physician (Hematology and Oncology) Jovita Kussmaul, MD as Consulting Physician (General Surgery)  DIAGNOSIS:    ICD-10-CM   1. MEN 2A (multiple endocrine neoplasia, type 2A) (HCC)  E31.22 Calcitonin    2. Malignant neoplasm of upper-inner quadrant of right breast in female, estrogen receptor positive ([place])  C50.211 LORazepam (ATIVAN) 0.5 MG tablet   Z17.0       SUMMARY OF ONCOLOGIC HISTORY: Oncology History  Malignant neoplasm of upper-inner quadrant of right breast in female, estrogen receptor positive ([place])  07/08/2020 Initial Diagnosis   a clinical mT2 N3, stage IIIA invasive ductal carcinoma, grade 1, triple positive  (a) breast MRI 07/06/2020 finds a 1.6 cm lower inner quadrant mass associated with 5.4 cm of non-mass-like enhancement, at least 8 enlarged right axillary lymph node and 1 right internal mammary lymph node  (b) CT scans of the neck and chest 06/24/2020 shows subcentimeter right cervical adenopathy, 2 right supraclavicular lymph nodes the larger 0.65 cm, also left cervical adenopathy largest measuring 1.02 cm; there is also a 1.2 cm lesion in the left hepatic lobe, and a sclerotic lesion in the mid thoracic spine at T7  (c) PET scan 07/21/2020 shows regional adenopathy (including 2 right subcentimeter supraclavicular lymph nodes and subcentimeter lymph nodes in the right hilar, subcarinal and lateral aortic region), no uptake in liver, and an indeterminate T12 lesion  (d) thoracic spine MRI 07/27/2020 shows the T12 lesion to be likely benign, there is also degenerative disc disease   07/23/2020 - 12/18/2020 Neo-Adjuvant Chemotherapy   neoadjuvant  chemotherapy will consist of carboplatin, docetaxel, trastuzumab, and pertuzumab q. 21 days x 6 07/23/2020-11/06/2020 --two doses of Trastuzumab given on 9/22 and 10/13   01/07/2021 Cancer Staging   Staging form: Breast, AJCC 8th Edition - Pathologic stage from 01/07/2021: No Stage Recommended (ypT2, pN60mi, cM0, G2) - Signed by Gardenia Phlegm, NP on 01/15/2021 Stage prefix: Post-therapy Histologic grading system: 3 grade system    01/07/2021 Surgery   She underwent bilateral mastectomy on 01/07/2021 that showed: left breast no evidence of malignancy, and the right breast invasive ductal carcinoma grade 2, spanning fibrotic area of approximately 5 cm in patches.  Margins were negative.   (A) Microscopic metastatic carcinoma was noted in 1 out of 15 lymph nodes biopsied.   01/26/2021 -  Chemotherapy   Patient is on Treatment Plan : BREAST ADO-Trastuzumab Emtansine (Kadcyla) q21d     02/13/2021 - 04/01/2021 Radiation Therapy   Adjuvant radiation   04/08/2021 -  Anti-estrogen oral therapy   Antiestrogen therapy with Zolodex and AI starting 04/21/21     CHIEF COMPLIANT: Follow-up of Kadcyla  INTERVAL HISTORY: Kayla Little is a 36 y.o. with above-mentioned history of triple positive locally advanced breast cancer, currently on treatment with Kadcyla. She presents to the clinic today for follow-up and treatment.  She is tolerating Kadcyla extremely well without any major problems or concerns.  Denies peripheral neuropathy.  ALLERGIES:  has No Known Allergies.  MEDICATIONS:  Current Outpatient Medications  Medication Sig Dispense Refill   acetaminophen (TYLENOL) 325 MG tablet Take 2 tablets (650 mg total) by mouth every 4 (four) hours as needed (for pain scale < 4). 100 tablet 0  calcitRIOL (ROCALTROL) 0.5 MCG capsule Take 1 capsule (0.5 mcg total) by mouth 2 (two) times a week. 30 capsule 1   calcium carbonate (OS-CAL) 600 MG TABS tablet Take 1 tablet by mouth daily.      HYDROcodone-acetaminophen (NORCO/VICODIN) 5-325 MG tablet Take 1 tablet by mouth every 6 (six) hours as needed for moderate pain. 15 tablet 0   levonorgestrel (MIRENA) 20 MCG/24HR IUD 1 each by Intrauterine route once.     levothyroxine (SYNTHROID) 175 MCG tablet Take 175 mcg by mouth daily before breakfast.     liothyronine (CYTOMEL) 5 MCG tablet Take 5 mcg by mouth daily.     loratadine (CLARITIN) 10 MG tablet Take 1 tablet (10 mg total) by mouth daily. 90 tablet 0   LORazepam (ATIVAN) 0.5 MG tablet Take 1 tablet (0.5 mg total) by mouth every 12 (twelve) hours as needed for anxiety. 30 tablet 0   methocarbamol (ROBAXIN) 500 MG tablet Take 1 tablet (500 mg total) by mouth every 6 (six) hours as needed for muscle spasms. 30 tablet 2   sertraline (ZOLOFT) 50 MG tablet Take 50 mg by mouth at bedtime.     VYVANSE 60 MG capsule Take 60 mg by mouth every morning.     No current facility-administered medications for this visit.   Facility-Administered Medications Ordered in Other Visits  Medication Dose Route Frequency Provider Last Rate Last Admin   sodium chloride flush (NS) 0.9 % injection 10 mL  10 mL Intravenous PRN Magrinat, Virgie Dad, MD        PHYSICAL EXAMINATION: ECOG PERFORMANCE STATUS: 1 - Symptomatic but completely ambulatory  Vitals:   03/31/21 0904  BP: 135/72  Pulse: (!) 113  Resp: 18  Temp: 97.8 F (36.6 C)  SpO2: 100%   Filed Weights   03/31/21 0904  Weight: 162 lb 8 oz (73.7 kg)      LABORATORY DATA:  I have reviewed the data as listed CMP Latest Ref Rng & Units 03/31/2021 03/10/2021 02/16/2021  Glucose 70 - 99 mg/dL 156(H) 100(H) 98  BUN 6 - 20 mg/dL 9 12 9   Creatinine 0.44 - 1.00 mg/dL 0.79 0.88 0.85  Sodium 135 - 145 mmol/L 136 136 140  Potassium 3.5 - 5.1 mmol/L 3.6 3.9 4.1  Chloride 98 - 111 mmol/L 103 103 106  CO2 22 - 32 mmol/L 26 25 25   Calcium 8.9 - 10.3 mg/dL 8.6(L) 8.6(L) 8.8(L)  Total Protein 6.5 - 8.1 g/dL 7.2 7.1 6.7  Total Bilirubin 0.3 - 1.2  mg/dL 0.5 0.5 0.3  Alkaline Phos 38 - 126 U/L 50 53 58  AST 15 - 41 U/L 36 49(H) 46(H)  ALT 0 - 44 U/L 32 44 36    Lab Results  Component Value Date   WBC 8.4 03/31/2021   HGB 13.7 03/31/2021   HCT 41.0 03/31/2021   MCV 98.6 03/31/2021   PLT 269 03/31/2021   NEUTROABS 6.4 03/31/2021    ASSESSMENT & PLAN:  Malignant neoplasm of upper-inner quadrant of right breast in female, estrogen receptor positive (HCC) stage IIIa triple positive breast cancer.   Treatment history: 1.  Initial diagnosis with T2N3 triple positive breast cancer PET scan showed regional adenopathy with no distant metastasis. 2.  Neoadjuvant chemotherapy with TCHP x6 from 07/23/2020 through 11/06/2020 3.  2 doses of Herceptin on 11/27/2020 and 12/18/2020 4.  Bilateral mastectomies on January 07, 2021 which showed T2 N1 MI residual cancer. 5.  Patient began adjuvant Kadcyla x14 cycles beginning January 26, 2021 6.  Adjuvant radiation beginning February 13, 2021 7.  To be followed by adjuvant antiestrogen therapy. _______________________________________________________________________________________________ Current treatment: Kadcyla maintenance Kadcyla toxicities: Echocardiogram completed 12/29/2020: EF 50 to 55% I discussed with her about antiestrogen therapy and recommended Zoladex with aromatase inhibitor She also be counseled about neratinib once she finishes with Kadcyla in August 2023. She is okay with coming for Zoladex injections monthly  History of medullary thyroid cancer with a slight increase in calcitonin levels: I will add calcitonin level with the next blood work in 3 weeks with  Return to clinic every 3 weeks for Kadcyla and every 6 weeks to follow-up with me.    Orders Placed This Encounter  Procedures   Calcitonin    Standing Status:   Future    Standing Expiration Date:   03/31/2022   The patient has a good understanding of the overall plan. she agrees with it. she will call with any  problems that may develop before the next visit here.  Total time spent: 60 mins including face to face time and time spent for planning, charting and coordination of care  Rulon Eisenmenger, MD, MPH 03/31/2021  I, Thana Ates, am acting as scribe for Dr. Nicholas Lose.  I have reviewed the above documentation for accuracy and completeness, and I agree with the above.

## 2021-03-31 ENCOUNTER — Ambulatory Visit
Admission: RE | Admit: 2021-03-31 | Discharge: 2021-03-31 | Disposition: A | Discharge status: Home / Self Care | Payer: 59 | Source: Ambulatory Visit | Attending: Radiation Oncology | Admitting: Radiation Oncology

## 2021-03-31 ENCOUNTER — Inpatient Hospital Stay: Payer: 59 | Admitting: Hematology and Oncology

## 2021-03-31 ENCOUNTER — Encounter: Payer: Self-pay | Admitting: *Deleted

## 2021-03-31 ENCOUNTER — Inpatient Hospital Stay: Payer: 59

## 2021-03-31 VITALS — BP 135/72 | HR 113 | Temp 97.8°F | Resp 18 | Ht 66.0 in | Wt 162.5 lb

## 2021-03-31 DIAGNOSIS — Z170 Estrogen receptor positive status [ER+]: Secondary | ICD-10-CM

## 2021-03-31 DIAGNOSIS — E3122 Multiple endocrine neoplasia [MEN] type IIA: Secondary | ICD-10-CM

## 2021-03-31 DIAGNOSIS — Z95828 Presence of other vascular implants and grafts: Secondary | ICD-10-CM

## 2021-03-31 DIAGNOSIS — C50211 Malignant neoplasm of upper-inner quadrant of right female breast: Secondary | ICD-10-CM

## 2021-03-31 DIAGNOSIS — C50311 Malignant neoplasm of lower-inner quadrant of right female breast: Secondary | ICD-10-CM | POA: Diagnosis not present

## 2021-03-31 LAB — CBC WITH DIFFERENTIAL/PLATELET
Abs Immature Granulocytes: 0.02 10*3/uL (ref 0.00–0.07)
Basophils Absolute: 0 10*3/uL (ref 0.0–0.1)
Basophils Relative: 1 %
Eosinophils Absolute: 0.2 10*3/uL (ref 0.0–0.5)
Eosinophils Relative: 2 %
HCT: 41 % (ref 36.0–46.0)
Hemoglobin: 13.7 g/dL (ref 12.0–15.0)
Immature Granulocytes: 0 %
Lymphocytes Relative: 12 %
Lymphs Abs: 1 10*3/uL (ref 0.7–4.0)
MCH: 32.9 pg (ref 26.0–34.0)
MCHC: 33.4 g/dL (ref 30.0–36.0)
MCV: 98.6 fL (ref 80.0–100.0)
Monocytes Absolute: 0.7 10*3/uL (ref 0.1–1.0)
Monocytes Relative: 9 %
Neutro Abs: 6.4 10*3/uL (ref 1.7–7.7)
Neutrophils Relative %: 76 %
Platelets: 269 10*3/uL (ref 150–400)
RBC: 4.16 MIL/uL (ref 3.87–5.11)
RDW: 14.2 % (ref 11.5–15.5)
WBC: 8.4 10*3/uL (ref 4.0–10.5)
nRBC: 0 % (ref 0.0–0.2)

## 2021-03-31 LAB — COMPREHENSIVE METABOLIC PANEL
ALT: 32 U/L (ref 0–44)
AST: 36 U/L (ref 15–41)
Albumin: 4.3 g/dL (ref 3.5–5.0)
Alkaline Phosphatase: 50 U/L (ref 38–126)
Anion gap: 7 (ref 5–15)
BUN: 9 mg/dL (ref 6–20)
CO2: 26 mmol/L (ref 22–32)
Calcium: 8.6 mg/dL — ABNORMAL LOW (ref 8.9–10.3)
Chloride: 103 mmol/L (ref 98–111)
Creatinine, Ser: 0.79 mg/dL (ref 0.44–1.00)
GFR, Estimated: >60 mL/min (ref >60)
Glucose, Bld: 156 mg/dL — ABNORMAL HIGH (ref 70–99)
Potassium: 3.6 mmol/L (ref 3.5–5.1)
Sodium: 136 mmol/L (ref 135–145)
Total Bilirubin: 0.5 mg/dL (ref 0.3–1.2)
Total Protein: 7.2 g/dL (ref 6.5–8.1)

## 2021-03-31 LAB — PREGNANCY, URINE: Preg Test, Ur: NEGATIVE

## 2021-03-31 MED ORDER — LORAZEPAM 0.5 MG PO TABS: 0.5000 mg | ORAL_TABLET | Freq: Two times a day (BID) | ORAL | 0 refills | Status: AC | PRN

## 2021-03-31 MED ORDER — DIPHENHYDRAMINE HCL 25 MG PO CAPS
25.0000 mg | ORAL_CAPSULE | Freq: Once | ORAL | Status: AC
  Administered 2021-03-31: 11:00:00 25 mg via ORAL
  Filled 2021-03-31: qty 1

## 2021-03-31 MED ORDER — HEPARIN SOD (PORK) LOCK FLUSH 100 UNIT/ML IV SOLN
500.0000 [IU] | Freq: Once | INTRAVENOUS | Status: AC | PRN
  Administered 2021-03-31: 13:00:00 500 [IU]

## 2021-03-31 MED ORDER — SODIUM CHLORIDE 0.9% FLUSH
10.0000 mL | INTRAVENOUS | Status: DC | PRN
  Administered 2021-03-31: 13:00:00 10 mL

## 2021-03-31 MED ORDER — SODIUM CHLORIDE 0.9 % IV SOLN
3.4000 mg/kg | Freq: Once | INTRAVENOUS | Status: AC
  Administered 2021-03-31: 12:00:00 260 mg via INTRAVENOUS
  Filled 2021-03-31: qty 8

## 2021-03-31 MED ORDER — SODIUM CHLORIDE 0.9 % IV SOLN: Freq: Once | INTRAVENOUS | Status: AC

## 2021-03-31 MED ORDER — SODIUM CHLORIDE 0.9% FLUSH
10.0000 mL | Freq: Once | INTRAVENOUS | Status: AC
  Administered 2021-03-31: 09:00:00 10 mL

## 2021-03-31 MED ORDER — ACETAMINOPHEN 325 MG PO TABS
650.0000 mg | ORAL_TABLET | Freq: Once | ORAL | Status: AC
  Administered 2021-03-31: 11:00:00 650 mg via ORAL
  Filled 2021-03-31: qty 2

## 2021-03-31 NOTE — Patient Instructions (Signed)
[facility]   Discharge Instructions: Thank you for choosing [facility] to provide your oncology and hematology care.   If you have a lab appointment with the [facility], please go directly to the [facility] and check in at the registration area.   Wear comfortable clothing and clothing appropriate for easy access to any Portacath or PICC line.   We strive to give you quality time with your provider. You may need to reschedule your appointment if you arrive late (15 or more minutes).  Arriving late affects you and other patients whose appointments are after yours.  Also, if you miss three or more appointments without notifying the office, you may be dismissed from the clinic at the providers discretion.      For prescription refill requests, have your pharmacy contact our office and allow 72 hours for refills to be completed.    Today you received the following chemotherapy and/or immunotherapy agents: trastuzumab-emtansine      To help prevent nausea and vomiting after your treatment, we encourage you to take your nausea medication as directed.  BELOW ARE SYMPTOMS THAT SHOULD BE REPORTED IMMEDIATELY: *FEVER GREATER THAN 100.4 F (38 C) OR HIGHER *CHILLS OR SWEATING *NAUSEA AND VOMITING THAT IS NOT CONTROLLED WITH YOUR NAUSEA MEDICATION *UNUSUAL SHORTNESS OF BREATH *UNUSUAL BRUISING OR BLEEDING *URINARY PROBLEMS (pain or burning when urinating, or frequent urination) *BOWEL PROBLEMS (unusual diarrhea, constipation, pain near the anus) TENDERNESS IN MOUTH AND THROAT WITH OR WITHOUT PRESENCE OF ULCERS (sore throat, sores in mouth, or a toothache) UNUSUAL RASH, SWELLING OR PAIN  UNUSUAL VAGINAL DISCHARGE OR ITCHING   Items with * indicate a potential emergency and should be followed up as soon as possible or go to the Emergency Department if any problems should occur.  Please show the CHEMOTHERAPY ALERT CARD or IMMUNOTHERAPY ALERT CARD  at check-in to the Emergency Department and triage nurse.  Should you have questions after your visit or need to cancel or reschedule your appointment, please contact [facility]  Dept: [phone]  and follow the prompts.  Office hours are 8:00 a.m. to 4:30 p.m. Monday - Friday. Please note that voicemails left after 4:00 p.m. may not be returned until the following business day.  We are closed weekends and major holidays. You have access to a nurse at all times for urgent questions. Please call the main number to the clinic Dept: [phone] and follow the prompts.   For any non-urgent questions, you may also contact your provider using MyChart. We now offer e-Visits for anyone 33 and older to request care online for non-urgent symptoms. For details visit [email or web address].   Also download the MyChart app! Go to the app store, search "MyChart", open the app, select [facility], and log in with your MyChart username and password.  Due to Covid, a mask is required upon entering the hospital/clinic. If you do not have a mask, one will be given to you upon arrival. For doctor visits, patients may have 1 support person aged 81 or older with them. For treatment visits, patients cannot have anyone with them due to current Covid guidelines and our immunocompromised population.

## 2021-03-31 NOTE — Progress Notes (Signed)
Patient declined to stay for 30 minutes post infusion for observation.

## 2021-03-31 NOTE — Assessment & Plan Note (Signed)
stage IIIa triple positive breast cancer.  Treatment history: 1.  Initial diagnosis with T2N3 triple positive breast cancer PET scan showed regional adenopathy with no distant metastasis. 2.  Neoadjuvant chemotherapy with TCHP x6 from 07/23/2020 through 11/06/2020 3.  2 doses of Herceptin on 11/27/2020 and 12/18/2020 4.  Bilateral mastectomies on January 07, 2021 which showed T2 N1 MI residual cancer. 5.  Patient began adjuvant Kadcyla x14 cycles beginning January 26, 2021 6.  Adjuvant radiation beginning February 13, 2021 7.  To be followed by adjuvant antiestrogen therapy. _______________________________________________________________________________________________ Current treatment: Kadcyla maintenance Kadcyla toxicities: Echocardiogram completed 12/29/2020: EF 50 to 55%  Return to clinic every 3 weeks for Kadcyla and every 6 weeks to follow-up with me.

## 2021-03-31 NOTE — Progress Notes (Signed)
° °                                                                                                                                                          °  Patient Name: [name] MRN: [id number] DOB: 09-Dec-1985 Referring Physician: Chipper Herb Date of Service: 04/01/2021 [facility], [place]                                                        End Of Treatment Note  Diagnoses: C50.211-Malignant neoplasm of upper-inner quadrant of right female breast  Cancer Staging: Stage IIIA, [id number] grade 1, triple positive invasive ductal carcinoma of the right breast.  Intent: Curative  Radiation Treatment Dates: 02/12/2021 through 04/01/2021 Site Technique Total Dose (Gy) Dose per Fx (Gy) Completed Fx Beam Energies  Chest Wall, Right: CW_R 3D 50.4/50.4 1.8 28/28 6XFFF, 10X  Chest Wall, Right: CW_R_SCLV 3D 50.4/50.4 1.8 28/28 6X, 10X  Chest Wall, Right: CW_R_Bst Electron 10/10 2 5/5 6E   Narrative: The patient tolerated radiation therapy relatively well. She developed fatigue and anticipated skin changes in the treatment field, she had dry desquamation at the conclusion of treatment, but no moist desquamation in the upper axilla.   Plan: The patient will receive a call in about one month from the radiation oncology department. She will continue follow up with Dr. Lindi Adie as well.  ________________________________________________    Carola Rhine, [facility]

## 2021-04-01 ENCOUNTER — Telehealth: Payer: Self-pay | Admitting: Hematology and Oncology

## 2021-04-01 ENCOUNTER — Ambulatory Visit: Payer: 59

## 2021-04-01 ENCOUNTER — Encounter: Payer: Self-pay | Admitting: Radiation Oncology

## 2021-04-01 ENCOUNTER — Ambulatory Visit
Admission: RE | Admit: 2021-04-01 | Discharge: 2021-04-01 | Disposition: A | Discharge status: Home / Self Care | Payer: 59 | Source: Ambulatory Visit | Attending: Radiation Oncology | Admitting: Radiation Oncology

## 2021-04-01 DIAGNOSIS — C50311 Malignant neoplasm of lower-inner quadrant of right female breast: Secondary | ICD-10-CM | POA: Diagnosis not present

## 2021-04-01 NOTE — Telephone Encounter (Signed)
Scheduled appointment per 1/24 los. Left message.

## 2021-04-02 ENCOUNTER — Encounter: Payer: Self-pay | Admitting: Hematology and Oncology

## 2021-04-02 LAB — CALCIUM, IONIZED: Calcium, Ionized, Serum: 4.5 mg/dL (ref 4.5–5.6)

## 2021-04-08 ENCOUNTER — Other Ambulatory Visit: Payer: Self-pay

## 2021-04-08 ENCOUNTER — Ambulatory Visit: Payer: 59 | Attending: General Surgery

## 2021-04-08 DIAGNOSIS — Z483 Aftercare following surgery for neoplasm: Secondary | ICD-10-CM

## 2021-04-08 DIAGNOSIS — C50311 Malignant neoplasm of lower-inner quadrant of right female breast: Secondary | ICD-10-CM

## 2021-04-08 DIAGNOSIS — M25612 Stiffness of left shoulder, not elsewhere classified: Secondary | ICD-10-CM | POA: Diagnosis present

## 2021-04-08 DIAGNOSIS — M25611 Stiffness of right shoulder, not elsewhere classified: Secondary | ICD-10-CM | POA: Diagnosis present

## 2021-04-08 DIAGNOSIS — Z170 Estrogen receptor positive status [ER+]: Secondary | ICD-10-CM | POA: Diagnosis present

## 2021-04-08 DIAGNOSIS — R293 Abnormal posture: Secondary | ICD-10-CM | POA: Diagnosis present

## 2021-04-08 NOTE — Therapy (Signed)
[facility] [place], [place], [address] Phone: [phone]   Fax:  [phone]  Physical Therapy Treatment  Patient Details  Name: Kayla Little MRN: [id number] Date of Birth: 1986/03/07 Referring Provider (PT): Dr. Autumn Messing   Encounter Date: 04/08/2021   PT End of Session - 04/08/21 1611     Visit Number 13    Number of Visits 22    Date for PT Re-Evaluation 04/14/21    PT Start Time 2376    PT Stop Time 1550    PT Time Calculation (min) 45 min    Activity Tolerance Patient tolerated treatment well    Behavior During Therapy [facility] for tasks assessed/performed             Past Medical History:  Diagnosis Date   ADD (attention deficit disorder)    Breast cancer ([place]) 06/30/2020   Breast lump    R breast   Depression    Family history of breast cancer    Family history of lymphoma    Family history of ovarian cancer    Family history of prostate cancer    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)    Hx of varicella    Kidney stone 2012   Medullary thyroid carcinoma ([place])    Monoallelic mutation of CHEK2 gene in female patient    Postpartum care following vaginal delivery (7/27) 10/02/2014, June 16 , 2020   Second degree perineal laceration 08/23/2018   SVD (spontaneous vaginal delivery) 08/23/2018   Thyroid cancer ([place]) 2021    Past Surgical History:  Procedure Laterality Date   IR IMAGING GUIDED PORT INSERTION  07/22/2020   MODIFIED MASTECTOMY Right 01/07/2021   Procedure: RIGHT MODIFIED RADICAL MASTECTOMY;  Surgeon: Jovita Kussmaul, MD;  Location: [facility];  Service: General;  Laterality: Right;   NO PAST SURGERIES     SIMPLE MASTECTOMY WITH AXILLARY SENTINEL NODE BIOPSY Left 01/07/2021   Procedure: LEFT SIMPLE MASTECTOMY;  Surgeon: Jovita Kussmaul, MD;  Location: [facility];  Service: General;  Laterality: Left;   THYROIDECTOMY N/A 04/12/2019    Procedure: TOTAL THYROIDECTOMY WITH LIMITED LYMPH NODE DISSECTION;  Surgeon: Armandina Gemma, MD;  Location: [facility];  Service: General;  Laterality: N/A;   UNILATERAL SALPINGECTOMY Right 07/09/2017   Procedure: RIGHT SALPINGECTOMY WITH REMOVAL OF ECTOPIC PREGNANCY;  Surgeon: Azucena Fallen, MD;  Location: [facility];  Service: Gynecology;  Laterality: Right;    There were no vitals filed for this visit.   Subjective Assessment - 04/08/21 1505     Subjective My skin got irritated with my final treatment, but then with the boost I got really burned and got really itchy and peeled alot. Its much better now.  I fell over a bookshelf that was laying on the floor and caught myself with the left hand and then tucked right shoulder and landed on the posterior upper arm.  It got bruised, but I don't think its swollen.  I am still fatigued from the radiation so I haven't done the ABC strength yet. I would like to go through it with you 1 time all the way through.  I feel like there is a cord in the right axillary region that is limiting my ROM    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 She underwent neoadjuvant chemotherapy from 07/23/2020-11/06/2020 followed by  a bilateral mastectomy with a right axillary lymph node dissection (1/15 nodes positive). She has a hx of medullary thyroid cancer with a thyroidectomy 04/12/2019. She smokes cigarettes infrequently and drinks red wine daily with varying amounts 0-3 glasses.    Patient Stated Goals Get my arms back to normal    Currently in Pain? No/denies    Pain Score 0-No pain                   LYMPHEDEMA/ONCOLOGY QUESTIONNAIRE - 04/08/21 0001       Right Upper Extremity Lymphedema   15 cm Proximal to Olecranon Process 31.4 cm    10 cm Proximal to Olecranon Process 28.9 cm    Olecranon Process 24.6 cm      Left Upper Extremity Lymphedema   15 cm Proximal to Olecranon  Process 31.9 cm    10 cm Proximal to Olecranon Process 29.1 cm    Olecranon Process 24 cm                        [facility] Adult PT Treatment/Exercise - 04/08/21 0001       Exercises   Other Exercises  ABC strength step ups to end. 10 reps each side, 2# wt for exercises      Manual Therapy   Manual therapy comments measured bilateral upper arms after pt fall. No noted edema    Myofascial Release MFR to right axillary region area of cording with arm in varying degrees of abd    Passive ROM PROM to right shoulder during MFR techniques to cording                          PT Long Term Goals - 04/08/21 1616       PT LONG TERM GOAL #1   Title Patient will demonstrate she has regained full shoulder ROM and function post operatively compared to baselines.    Time 4    Period Weeks    Status Achieved    Target Date 02/26/21      PT LONG TERM GOAL #2   Title Patient will increase right shoulder AROM to be >/= 145 degrees for increased ease reaching.    Time 4    Period Weeks    Status Achieved    Target Date 02/26/21      PT LONG TERM GOAL #3   Title Patient will increase right shoulder AROM abduction to >/= 160 degrees for increased ease obtaining radiation positioning.    Baseline 164 pre op and 112 post op    Time 4    Period Weeks    Status Achieved    Target Date 02/26/21      PT LONG TERM GOAL #4   Title Patient will improve her DASH score to zero for right UE function to return to baseline.    Time 4    Period Weeks    Status On-going    Target Date 04/14/21      PT LONG TERM GOAL #5   Title Patient will verbalize good understanding of lymphedema risk reduction.    Time 4    Period Weeks    Status Achieved    Target Date 03/03/21      PT LONG TERM GOAL #6   Title Pt will be independent in ABC strength program with reduced reps until she receives her sleeve    Time 6  Period Weeks    Status On-going      PT LONG TERM GOAL #7    Title pt will recieve her compression sleeve/gauntlet and will be independent with donning and doffing.    Time 6    Status New    Target Date 04/08/21                   Plan - 04/08/21 1618     Clinical Impression Statement Pt missed several appts secondary to a work conflict and skin irritation from radiation.  Pts skin improved now with dry desquamation. Performed MFR techniques to right axillary cording.  Pt felt much better afterwards and had noted improvement in ROM. Measured bilateral upper arms secondary to concerns after pt fall and landing on right arm. No significant difference in circumference noted. Instructed remainder of ABC handout.  Pt is progressing nicely toward goals. She will benefit from 1-2 more visits  to go through entire ABC booklet and address cording prn. Pt with mild trunk swelling on right since radiation and will try wearing her sports bra to get a little compression on it to reduce swelling if comfortable for her skin.    Stability/Clinical Decision Making Stable/Uncomplicated    Rehab Potential Good    PT Frequency 2x / week    PT Duration 6 weeks    PT Treatment/Interventions ADLs/Self Care Home Management;Therapeutic exercise;Patient/family education;Manual techniques;Manual lymph drainage;Passive range of motion;Scar mobilization    PT Next Visit Plan pt would like to go through [facility] strength handout in full, MFR to cording prn, SOZO screen?, swelling at lateral trunk? Quick dash/DC   PT Home Exercise Plan Post op HEP, supine scap series x5 reps, ABC handout    Consulted and Agree with Plan of Care Patient             Patient will benefit from skilled therapeutic intervention in order to improve the following deficits and impairments:  Postural dysfunction, Decreased range of motion, Decreased knowledge of precautions, Impaired UE functional use, Pain, Increased fascial restricitons, Decreased scar mobility, Decreased strength  Visit  Diagnosis: Malignant neoplasm of lower-inner quadrant of right breast of female, estrogen receptor positive (HCC)  Abnormal posture  Stiffness of right shoulder, not elsewhere classified  Stiffness of left shoulder, not elsewhere classified  Aftercare following surgery for neoplasm     Problem List Patient Active Problem List   Diagnosis Date Noted   Port-A-Cath in place 03/10/2021   Anxiety 01/23/2021   Attention deficit disorder 01/23/2021   History of medullary carcinoma of thyroid 01/23/2021   History of substance abuse ([place]) 01/23/2021   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   Prolonged QT interval 07/30/2020   Bone lesion 07/23/2020   MEN 2A (multiple endocrine neoplasia, type 2A) ([place]) 04/12/2019   Multiple thyroid nodules 04/08/2019   Family history of breast cancer    Family history of prostate cancer    Family history of lymphoma    Family history of ovarian cancer    Monoallelic mutation of CHEK2 gene in female patient    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

## 2021-04-13 ENCOUNTER — Encounter: Payer: Self-pay | Admitting: *Deleted

## 2021-04-16 ENCOUNTER — Ambulatory Visit: Payer: 59

## 2021-04-16 ENCOUNTER — Other Ambulatory Visit: Payer: Self-pay

## 2021-04-16 DIAGNOSIS — C50311 Malignant neoplasm of lower-inner quadrant of right female breast: Secondary | ICD-10-CM | POA: Diagnosis not present

## 2021-04-16 DIAGNOSIS — M25612 Stiffness of left shoulder, not elsewhere classified: Secondary | ICD-10-CM

## 2021-04-16 DIAGNOSIS — M25611 Stiffness of right shoulder, not elsewhere classified: Secondary | ICD-10-CM

## 2021-04-16 DIAGNOSIS — R293 Abnormal posture: Secondary | ICD-10-CM

## 2021-04-16 DIAGNOSIS — Z170 Estrogen receptor positive status [ER+]: Secondary | ICD-10-CM

## 2021-04-16 DIAGNOSIS — Z483 Aftercare following surgery for neoplasm: Secondary | ICD-10-CM

## 2021-04-16 NOTE — Therapy (Signed)
[facility] @ [address] [place], [place], [address] Phone: [phone]   Fax:  [phone]  Physical Therapy Treatment  Patient Details  Name: Kayla Little MRN: [id number] Date of Birth: December 01, 1985 Referring Provider (PT): Dr. Autumn Messing   Encounter Date: 04/16/2021   PT End of Session - 04/16/21 1541     Visit Number 14    Number of Visits 22    Date for PT Re-Evaluation 04/16/21    PT Start Time 0211    PT Stop Time 1552    PT Time Calculation (min) 47 min    Activity Tolerance Patient tolerated treatment well    Behavior During Therapy [facility] for tasks assessed/performed             Past Medical History:  Diagnosis Date   ADD (attention deficit disorder)    Breast cancer ([place]) 06/30/2020   Breast lump    R breast   Depression    Family history of breast cancer    Family history of lymphoma    Family history of ovarian cancer    Family history of prostate cancer    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)    Hx of varicella    Kidney stone 2012   Medullary thyroid carcinoma ([place])    Monoallelic mutation of CHEK2 gene in female patient    Postpartum care following vaginal delivery (7/27) 10/02/2014, June 16 , 2020   Second degree perineal laceration 08/23/2018   SVD (spontaneous vaginal delivery) 08/23/2018   Thyroid cancer ([place]) 2021    Past Surgical History:  Procedure Laterality Date   IR IMAGING GUIDED PORT INSERTION  07/22/2020   MODIFIED MASTECTOMY Right 01/07/2021   Procedure: RIGHT MODIFIED RADICAL MASTECTOMY;  Surgeon: Jovita Kussmaul, MD;  Location: [facility];  Service: General;  Laterality: Right;   NO PAST SURGERIES     SIMPLE MASTECTOMY WITH AXILLARY SENTINEL NODE BIOPSY Left 01/07/2021   Procedure: LEFT SIMPLE MASTECTOMY;  Surgeon: Jovita Kussmaul, MD;  Location: [facility];  Service: General;  Laterality: Left;   THYROIDECTOMY N/A 04/12/2019    Procedure: TOTAL THYROIDECTOMY WITH LIMITED LYMPH NODE DISSECTION;  Surgeon: Armandina Gemma, MD;  Location: [facility];  Service: General;  Laterality: N/A;   UNILATERAL SALPINGECTOMY Right 07/09/2017   Procedure: RIGHT SALPINGECTOMY WITH REMOVAL OF ECTOPIC PREGNANCY;  Surgeon: Azucena Fallen, MD;  Location: [facility];  Service: Gynecology;  Laterality: Right;    There were no vitals filed for this visit.   Subjective Assessment - 04/16/21 1517     Subjective Cording is bothering me some today. I feel comfortable with the exercises. I played tennis the other day for about an hour and I did pretty well, and went rollerblading and playing soccer with my son.    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 She underwent neoadjuvant chemotherapy from 07/23/2020-11/06/2020 followed by a bilateral mastectomy with a right axillary lymph node dissection (1/15 nodes positive). She has a hx of medullary thyroid cancer with a thyroidectomy 04/12/2019. She smokes cigarettes infrequently and drinks red wine daily with varying amounts 0-3 glasses.                    L-DEX FLOWSHEETS - 04/16/21 1500       L-DEX LYMPHEDEMA SCREENING   Measurement Type Unilateral    L-DEX MEASUREMENT  EXTREMITY Upper Extremity    POSITION  Standing    DOMINANT SIDE Right    At Risk Side Right    BASELINE SCORE (UNILATERAL) -0.7    L-DEX SCORE (UNILATERAL) -0.3    VALUE CHANGE (UNILAT) 0.4               Quick Dash - 04/16/21 0001     Open a tight or new jar No difficulty    Do heavy household chores (wash walls, wash floors) No difficulty    Carry a shopping bag or briefcase No difficulty    Wash your back Mild difficulty    Use a knife to cut food No difficulty    Recreational activities in which you take some force or impact through your arm, shoulder, or hand (golf, hammering, tennis) No difficulty    During the past week,  to what extent has your arm, shoulder or hand problem interfered with your normal social activities with family, friends, neighbors, or groups? Not at all    During the past week, to what extent has your arm, shoulder or hand problem limited your work or other regular daily activities Not at all    Arm, shoulder, or hand pain. None    Tingling (pins and needles) in your arm, shoulder, or hand None    Difficulty Sleeping No difficulty    DASH Score 2.27 %                    [facility] Adult PT Treatment/Exercise - 04/16/21 0001       Exercises   Other Exercises  Pt was taken through [facility] strength handout wearing her compression sleeve and with 3 # wts x 10 reps for all strength exercises.  pt used very good form and required very few VC's                          PT Long Term Goals - 04/16/21 1612       PT LONG TERM GOAL #1   Title Patient will demonstrate she has regained full shoulder ROM and function post operatively compared to baselines.    Time 4    Period Weeks    Status Achieved    Target Date 02/26/21      PT LONG TERM GOAL #2   Title Patient will increase right shoulder AROM to be >/= 145 degrees for increased ease reaching.    Time 4    Period Weeks    Status Achieved    Target Date 02/26/21      PT LONG TERM GOAL #3   Title Patient will increase right shoulder AROM abduction to >/= 160 degrees for increased ease obtaining radiation positioning.    Time 4    Period Weeks    Status Achieved    Target Date 02/26/21      PT LONG TERM GOAL #4   Title Patient will improve her DASH score to zero for right UE function to return to baseline.    Baseline 2.27 today    Period Weeks    Status Not Met    Target Date 04/16/21      PT LONG TERM GOAL #5   Title Patient will verbalize good understanding of lymphedema risk reduction.    Time 4    Period Weeks    Status Achieved    Target Date 03/03/21      PT LONG TERM GOAL #6  Title Pt will be  independent in ABC strength program with reduced reps until she receives her sleeve    Time 6    Period Weeks    Status Achieved    Target Date 04/16/21      PT LONG TERM GOAL #7   Title pt will recieve her compression sleeve/gauntlet and will be independent with donning and doffing.    Time 6    Period Weeks    Status Achieved                   Plan - 04/16/21 1614     Clinical Impression Statement Pt was late today secondary to work. Performed SOZO with result very near her baseline.  Pt was taken through all of the ABC strength handout strengthening exercises with 3# x 10 repetitions ea. She used very good form and required only occasional VC's for proper form. We discussed progression of exercises and she has a good understanding of the proper way to progress reps and resistance. She achieved all but one goal established which was for quick dash of 0. She scored 2.27% disability so nearly achieved that goal. She is compliant with HEP, and wearing compression sleeve for repetitive activities. She has achieved full shoulder ROM, but does occasionally feel end range tightness secondary to cording which she has been able to stretch out herself. She feels ready for discharge to HEP. She knows to call with any questions or concerns.    Stability/Clinical Decision Making Stable/Uncomplicated    Rehab Potential Good    PT Frequency 2x / week    PT Duration 6 weeks    PT Treatment/Interventions ADLs/Self Care Home Management;Therapeutic exercise;Patient/family education;Manual techniques;Manual lymph drainage;Passive range of motion;Scar mobilization    PT Next Visit Plan pt is discharged to independent self management.    PT Home Exercise Plan Post op HEP, supine scap series x5 reps, ABC strength handout , compression sleeve   Consulted and Agree with Plan of Care Patient             Patient will benefit from skilled therapeutic intervention in order to improve the following  deficits and impairments:  Postural dysfunction, Decreased range of motion, Decreased knowledge of precautions, Impaired UE functional use, Pain, Increased fascial restricitons, Decreased scar mobility, Decreased strength  Visit Diagnosis: Malignant neoplasm of lower-inner quadrant of right breast of female, estrogen receptor positive (HCC)  Abnormal posture  Stiffness of right shoulder, not elsewhere classified  Stiffness of left shoulder, not elsewhere classified  Aftercare following surgery for neoplasm     Problem List Patient Active Problem List   Diagnosis Date Noted   Port-A-Cath in place 03/10/2021   Anxiety 01/23/2021   Attention deficit disorder 01/23/2021   History of medullary carcinoma of thyroid 01/23/2021   History of substance abuse ([place]) 01/23/2021   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   Prolonged QT interval 07/30/2020   Bone lesion 07/23/2020   MEN 2A (multiple endocrine neoplasia, type 2A) ([place]) 04/12/2019   Multiple thyroid nodules 04/08/2019   Family history of breast cancer    Family history of prostate cancer    Family history of lymphoma    Family history of ovarian cancer    Monoallelic mutation of CHEK2 gene in female patient    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 Patient goals were met. Patient is being discharged due to being pleased with the current functional  level.   Claris Pong, PT 04/16/2021, 5:11 PM  [facility] @ [address] [place], [place], [address] Phone: [phone]   Fax:  [phone]  Name: Kayla Little MRN: [id number] Date of Birth: 1985/03/20

## 2021-04-20 ENCOUNTER — Ambulatory Visit: Payer: Self-pay

## 2021-04-24 ENCOUNTER — Other Ambulatory Visit: Payer: Self-pay

## 2021-04-24 ENCOUNTER — Inpatient Hospital Stay: Payer: 59 | Attending: Hematology and Oncology

## 2021-04-24 ENCOUNTER — Inpatient Hospital Stay: Payer: 59

## 2021-04-24 VITALS — BP 117/88 | HR 97 | Temp 97.5°F | Resp 18 | Wt 158.8 lb

## 2021-04-24 DIAGNOSIS — Z170 Estrogen receptor positive status [ER+]: Secondary | ICD-10-CM

## 2021-04-24 DIAGNOSIS — C50211 Malignant neoplasm of upper-inner quadrant of right female breast: Secondary | ICD-10-CM | POA: Diagnosis present

## 2021-04-24 DIAGNOSIS — Z5112 Encounter for antineoplastic immunotherapy: Secondary | ICD-10-CM | POA: Diagnosis not present

## 2021-04-24 DIAGNOSIS — Z95828 Presence of other vascular implants and grafts: Secondary | ICD-10-CM

## 2021-04-24 DIAGNOSIS — E3122 Multiple endocrine neoplasia [MEN] type IIA: Secondary | ICD-10-CM

## 2021-04-24 LAB — CBC WITH DIFFERENTIAL/PLATELET
Abs Immature Granulocytes: 0.02 10*3/uL (ref 0.00–0.07)
Basophils Absolute: 0 10*3/uL (ref 0.0–0.1)
Basophils Relative: 1 %
Eosinophils Absolute: 0.2 10*3/uL (ref 0.0–0.5)
Eosinophils Relative: 3 %
HCT: 41.1 % (ref 36.0–46.0)
Hemoglobin: 13.9 g/dL (ref 12.0–15.0)
Immature Granulocytes: 0 %
Lymphocytes Relative: 18 %
Lymphs Abs: 1.1 10*3/uL (ref 0.7–4.0)
MCH: 33.7 pg (ref 26.0–34.0)
MCHC: 33.8 g/dL (ref 30.0–36.0)
MCV: 99.5 fL (ref 80.0–100.0)
Monocytes Absolute: 0.7 10*3/uL (ref 0.1–1.0)
Monocytes Relative: 12 %
Neutro Abs: 4 10*3/uL (ref 1.7–7.7)
Neutrophils Relative %: 66 %
Platelets: 251 10*3/uL (ref 150–400)
RBC: 4.13 MIL/uL (ref 3.87–5.11)
RDW: 14 % (ref 11.5–15.5)
WBC: 6 10*3/uL (ref 4.0–10.5)
nRBC: 0 % (ref 0.0–0.2)

## 2021-04-24 LAB — COMPREHENSIVE METABOLIC PANEL
ALT: 27 U/L (ref 0–44)
AST: 36 U/L (ref 15–41)
Albumin: 4.2 g/dL (ref 3.5–5.0)
Alkaline Phosphatase: 57 U/L (ref 38–126)
Anion gap: 6 (ref 5–15)
BUN: 7 mg/dL (ref 6–20)
CO2: 26 mmol/L (ref 22–32)
Calcium: 8 mg/dL — ABNORMAL LOW (ref 8.9–10.3)
Chloride: 103 mmol/L (ref 98–111)
Creatinine, Ser: 0.82 mg/dL (ref 0.44–1.00)
GFR, Estimated: >60 mL/min (ref >60)
Glucose, Bld: 99 mg/dL (ref 70–99)
Potassium: 3.9 mmol/L (ref 3.5–5.1)
Sodium: 135 mmol/L (ref 135–145)
Total Bilirubin: 0.3 mg/dL (ref 0.3–1.2)
Total Protein: 6.9 g/dL (ref 6.5–8.1)

## 2021-04-24 MED ORDER — SODIUM CHLORIDE 0.9 % IV SOLN
3.4000 mg/kg | Freq: Once | INTRAVENOUS | Status: AC
  Administered 2021-04-24: 260 mg via INTRAVENOUS
  Filled 2021-04-24: qty 8

## 2021-04-24 MED ORDER — SODIUM CHLORIDE 0.9% FLUSH
10.0000 mL | Freq: Once | INTRAVENOUS | Status: AC
  Administered 2021-04-24: 10 mL

## 2021-04-24 MED ORDER — HEPARIN SOD (PORK) LOCK FLUSH 100 UNIT/ML IV SOLN
500.0000 [IU] | Freq: Once | INTRAVENOUS | Status: AC | PRN
  Administered 2021-04-24: 500 [IU]

## 2021-04-24 MED ORDER — DIPHENHYDRAMINE HCL 25 MG PO CAPS
25.0000 mg | ORAL_CAPSULE | Freq: Once | ORAL | Status: AC
  Administered 2021-04-24: 25 mg via ORAL
  Filled 2021-04-24: qty 1

## 2021-04-24 MED ORDER — SODIUM CHLORIDE 0.9 % IV SOLN: Freq: Once | INTRAVENOUS | Status: AC

## 2021-04-24 MED ORDER — ACETAMINOPHEN 325 MG PO TABS
650.0000 mg | ORAL_TABLET | Freq: Once | ORAL | Status: AC
  Administered 2021-04-24: 650 mg via ORAL
  Filled 2021-04-24: qty 2

## 2021-04-24 MED ORDER — SODIUM CHLORIDE 0.9% FLUSH
10.0000 mL | INTRAVENOUS | Status: DC | PRN
  Administered 2021-04-24: 10 mL

## 2021-04-24 NOTE — Progress Notes (Signed)
Per MD, okay to proceed with treatment today based on labs from 1/24 due to delays in pt arrival and labwork resulting this AM

## 2021-04-24 NOTE — Progress Notes (Signed)
Per Lindi Adie MD, ok to proceed with treatment with lab results from 1/24

## 2021-04-24 NOTE — Progress Notes (Signed)
Pt refused 30 minute observation period post Kadcyla. VSS. No complaints upon discharge.  \

## 2021-04-26 LAB — CALCIUM, IONIZED: Calcium, Ionized, Serum: 4.4 mg/dL — ABNORMAL LOW (ref 4.5–5.6)

## 2021-04-26 LAB — CALCITONIN: Calcitonin: <2 pg/mL (ref 0.0–5.0)

## 2021-04-30 ENCOUNTER — Telehealth: Payer: Self-pay | Admitting: Adult Health

## 2021-04-30 NOTE — Telephone Encounter (Signed)
Called patient to update her on the changes made to her appointment. Left message. Patient will be mailed an updated calendar.

## 2021-05-04 ENCOUNTER — Ambulatory Visit
Admission: RE | Admit: 2021-05-04 | Discharge: 2021-05-04 | Disposition: A | Discharge status: Home / Self Care | Payer: 59 | Source: Ambulatory Visit | Attending: Radiation Oncology | Admitting: Radiation Oncology

## 2021-05-04 DIAGNOSIS — C50211 Malignant neoplasm of upper-inner quadrant of right female breast: Secondary | ICD-10-CM | POA: Insufficient documentation

## 2021-05-04 DIAGNOSIS — Z170 Estrogen receptor positive status [ER+]: Secondary | ICD-10-CM | POA: Insufficient documentation

## 2021-05-04 NOTE — Progress Notes (Signed)
°  Radiation Oncology         [phone] ________________________________  Name: Kayla Little MRN: [id number]  Date of Service: 05/04/2021  DOB: 07/03/1985  Post Treatment Telephone Note  Diagnosis:   Stage IIIA, [id number] grade 1, triple positive invasive ductal carcinoma of the right breast.  Intent: Curative  Radiation Treatment Dates: 02/12/2021 through 04/01/2021 Site Technique Total Dose (Gy) Dose per Fx (Gy) Completed Fx Beam Energies  Chest Wall, Right: CW_R 3D 50.4/50.4 1.8 28/28 6XFFF, 10X  Chest Wall, Right: CW_R_SCLV 3D 50.4/50.4 1.8 28/28 6X, 10X  Chest Wall, Right: CW_R_Bst Electron 10/10 2 5/5 6E   Narrative: The patient tolerated radiation therapy relatively well. She developed fatigue and anticipated skin changes in the treatment field, she had dry desquamation at the conclusion of treatment, but no moist desquamation in the upper axilla.      Impression/Plan: 1. Stage IIIA, [id number] grade 1, triple positive invasive ductal carcinoma of the right breast. I was unable to reach the patient but left a voicemail and on the message, I  discussed that we would be happy to continue to follow her as needed, but she will also continue to follow up with Dr. Lindi Adie in medical oncology.  I encouraged her to call back if she has questions or concerns about skin care.     Carola Rhine, PAC

## 2021-05-15 ENCOUNTER — Encounter: Payer: Self-pay | Admitting: Adult Health

## 2021-05-15 ENCOUNTER — Inpatient Hospital Stay: Payer: 59

## 2021-05-15 ENCOUNTER — Inpatient Hospital Stay: Payer: 59 | Attending: Hematology and Oncology

## 2021-05-15 ENCOUNTER — Inpatient Hospital Stay: Payer: 59 | Admitting: Adult Health

## 2021-05-15 ENCOUNTER — Other Ambulatory Visit: Payer: Self-pay

## 2021-05-15 VITALS — BP 119/69 | HR 99 | Temp 97.7°F | Resp 16 | Ht 66.0 in | Wt 161.3 lb

## 2021-05-15 VITALS — BP 120/76 | HR 86 | Temp 98.1°F | Resp 16

## 2021-05-15 DIAGNOSIS — Z170 Estrogen receptor positive status [ER+]: Secondary | ICD-10-CM

## 2021-05-15 DIAGNOSIS — C50211 Malignant neoplasm of upper-inner quadrant of right female breast: Secondary | ICD-10-CM

## 2021-05-15 DIAGNOSIS — Z5112 Encounter for antineoplastic immunotherapy: Secondary | ICD-10-CM | POA: Insufficient documentation

## 2021-05-15 DIAGNOSIS — Z79818 Long term (current) use of other agents affecting estrogen receptors and estrogen levels: Secondary | ICD-10-CM | POA: Diagnosis not present

## 2021-05-15 DIAGNOSIS — Z95828 Presence of other vascular implants and grafts: Secondary | ICD-10-CM

## 2021-05-15 LAB — COMPREHENSIVE METABOLIC PANEL
ALT: 26 U/L (ref 0–44)
AST: 27 U/L (ref 15–41)
Albumin: 4.2 g/dL (ref 3.5–5.0)
Alkaline Phosphatase: 52 U/L (ref 38–126)
Anion gap: 5 (ref 5–15)
BUN: 11 mg/dL (ref 6–20)
CO2: 27 mmol/L (ref 22–32)
Calcium: 8.4 mg/dL — ABNORMAL LOW (ref 8.9–10.3)
Chloride: 106 mmol/L (ref 98–111)
Creatinine, Ser: 0.77 mg/dL (ref 0.44–1.00)
GFR, Estimated: >60 mL/min (ref >60)
Glucose, Bld: 133 mg/dL — ABNORMAL HIGH (ref 70–99)
Potassium: 3.5 mmol/L (ref 3.5–5.1)
Sodium: 138 mmol/L (ref 135–145)
Total Bilirubin: 0.5 mg/dL (ref 0.3–1.2)
Total Protein: 6.8 g/dL (ref 6.5–8.1)

## 2021-05-15 LAB — CBC WITH DIFFERENTIAL/PLATELET
Abs Immature Granulocytes: 0.02 10*3/uL (ref 0.00–0.07)
Basophils Absolute: 0.1 10*3/uL (ref 0.0–0.1)
Basophils Relative: 1 %
Eosinophils Absolute: 0.2 10*3/uL (ref 0.0–0.5)
Eosinophils Relative: 3 %
HCT: 42.8 % (ref 36.0–46.0)
Hemoglobin: 14.3 g/dL (ref 12.0–15.0)
Immature Granulocytes: 0 %
Lymphocytes Relative: 21 %
Lymphs Abs: 1.5 10*3/uL (ref 0.7–4.0)
MCH: 33.6 pg (ref 26.0–34.0)
MCHC: 33.4 g/dL (ref 30.0–36.0)
MCV: 100.7 fL — ABNORMAL HIGH (ref 80.0–100.0)
Monocytes Absolute: 0.6 10*3/uL (ref 0.1–1.0)
Monocytes Relative: 8 %
Neutro Abs: 5.1 10*3/uL (ref 1.7–7.7)
Neutrophils Relative %: 67 %
Platelets: 273 10*3/uL (ref 150–400)
RBC: 4.25 MIL/uL (ref 3.87–5.11)
RDW: 13.6 % (ref 11.5–15.5)
WBC: 7.5 10*3/uL (ref 4.0–10.5)
nRBC: 0 % (ref 0.0–0.2)

## 2021-05-15 MED ORDER — SODIUM CHLORIDE 0.9% FLUSH
10.0000 mL | INTRAVENOUS | Status: DC | PRN
  Administered 2021-05-15: 10 mL

## 2021-05-15 MED ORDER — ACETAMINOPHEN 325 MG PO TABS
650.0000 mg | ORAL_TABLET | Freq: Once | ORAL | Status: AC
  Administered 2021-05-15: 650 mg via ORAL
  Filled 2021-05-15: qty 2

## 2021-05-15 MED ORDER — SODIUM CHLORIDE 0.9 % IV SOLN
3.4000 mg/kg | Freq: Once | INTRAVENOUS | Status: AC
  Administered 2021-05-15: 260 mg via INTRAVENOUS
  Filled 2021-05-15: qty 8

## 2021-05-15 MED ORDER — SODIUM CHLORIDE 0.9 % IV SOLN: Freq: Once | INTRAVENOUS | Status: AC

## 2021-05-15 MED ORDER — SODIUM CHLORIDE 0.9% FLUSH
10.0000 mL | Freq: Once | INTRAVENOUS | Status: AC
  Administered 2021-05-15: 10 mL

## 2021-05-15 MED ORDER — ANASTROZOLE 1 MG PO TABS: 1.0000 mg | ORAL_TABLET | Freq: Every day | ORAL | 3 refills | Status: DC

## 2021-05-15 MED ORDER — GOSERELIN ACETATE 3.6 MG ~~LOC~~ IMPL
3.6000 mg | DRUG_IMPLANT | Freq: Once | SUBCUTANEOUS | Status: AC
  Administered 2021-05-15: 3.6 mg via SUBCUTANEOUS
  Filled 2021-05-15: qty 3.6

## 2021-05-15 MED ORDER — DIPHENHYDRAMINE HCL 25 MG PO CAPS
25.0000 mg | ORAL_CAPSULE | Freq: Once | ORAL | Status: AC
  Administered 2021-05-15: 25 mg via ORAL
  Filled 2021-05-15: qty 1

## 2021-05-15 MED ORDER — HEPARIN SOD (PORK) LOCK FLUSH 100 UNIT/ML IV SOLN
500.0000 [IU] | Freq: Once | INTRAVENOUS | Status: AC | PRN
  Administered 2021-05-15: 500 [IU]

## 2021-05-15 NOTE — Assessment & Plan Note (Signed)
stage IIIa triple positive breast cancer. ?? ?Treatment history: ?1.  Initial diagnosis with T2N3 triple positive breast cancer PET scan showed regional adenopathy with no distant metastasis. ?2.  Neoadjuvant chemotherapy with TCHP x6 from 07/23/2020 through 11/06/2020 ?3.  2 doses of Herceptin on 11/27/2020 and 12/18/2020 ?4.  Bilateral mastectomies on January 07, 2021 which showed T2 N1 MI residual cancer. ?5.  Patient began adjuvant Kadcyla x14 cycles beginning January 26, 2021 ?6.  Adjuvant radiation beginning February 13, 2021 ?7.  To be followed by adjuvant antiestrogen therapy. ?_______________________________________________________________________________________________ ?Current treatment: Kadcyla maintenance ?Kadcyla toxicities: ?Echocardiogram completed 03/10/2020 EF 60-65% ? ?Kayla Little is tolerating treatment well.  She does have some mild fatigue.  This has not impacted her activity level and she remains active.  We discussed her appointment times and she has requested to come in at 915 or later in the day.   ? ?I asked nursing to please give her Zoladex injection today through the epic chat.  I also sent in anastrozole for her to take and we discussed possible side effects of the medication. ? ?Kayla Little will return in 3 weeks for labs, follow-up, Kadcyla.  She will then return in 4 weeks for an injection only.  She will see the provider with her [facility] that is due in 6 weeks. ? ?Return to clinic every 3 weeks for Kadcyla and every 6 weeks to follow-up with me. ?

## 2021-05-15 NOTE — Patient Instructions (Signed)
[facility]  Discharge Instructions: Thank you for choosing [facility] to provide your oncology and hematology care.   If you have a lab appointment with the [facility], please go directly to the [facility] and check in at the registration area.   Wear comfortable clothing and clothing appropriate for easy access to any Portacath or PICC line.   We strive to give you quality time with your provider. You may need to reschedule your appointment if you arrive late (15 or more minutes).  Arriving late affects you and other patients whose appointments are after yours.  Also, if you miss three or more appointments without notifying the office, you may be dismissed from the clinic at the providers discretion.      For prescription refill requests, have your pharmacy contact our office and allow 72 hours for refills to be completed.    Today you received the following chemotherapy and/or immunotherapy agents: Kadcyla      To help prevent nausea and vomiting after your treatment, we encourage you to take your nausea medication as directed.  BELOW ARE SYMPTOMS THAT SHOULD BE REPORTED IMMEDIATELY: *FEVER GREATER THAN 100.4 F (38 C) OR HIGHER *CHILLS OR SWEATING *NAUSEA AND VOMITING THAT IS NOT CONTROLLED WITH YOUR NAUSEA MEDICATION *UNUSUAL SHORTNESS OF BREATH *UNUSUAL BRUISING OR BLEEDING *URINARY PROBLEMS (pain or burning when urinating, or frequent urination) *BOWEL PROBLEMS (unusual diarrhea, constipation, pain near the anus) TENDERNESS IN MOUTH AND THROAT WITH OR WITHOUT PRESENCE OF ULCERS (sore throat, sores in mouth, or a toothache) UNUSUAL RASH, SWELLING OR PAIN  UNUSUAL VAGINAL DISCHARGE OR ITCHING   Items with * indicate a potential emergency and should be followed up as soon as possible or go to the Emergency Department if any problems should occur.  Please show the CHEMOTHERAPY ALERT CARD or IMMUNOTHERAPY ALERT CARD at check-in to  the Emergency Department and triage nurse.  Should you have questions after your visit or need to cancel or reschedule your appointment, please contact [facility]  Dept: [phone]  and follow the prompts.  Office hours are 8:00 a.m. to 4:30 p.m. Monday - Friday. Please note that voicemails left after 4:00 p.m. may not be returned until the following business day.  We are closed weekends and major holidays. You have access to a nurse at all times for urgent questions. Please call the main number to the clinic Dept: [phone] and follow the prompts.   For any non-urgent questions, you may also contact your provider using MyChart. We now offer e-Visits for anyone 7 and older to request care online for non-urgent symptoms. For details visit [email or web address].   Also download the MyChart app! Go to the app store, search "MyChart", open the app, select [facility], and log in with your MyChart username and password.  Due to Covid, a mask is required upon entering the hospital/clinic. If you do not have a mask, one will be given to you upon arrival. For doctor visits, patients may have 1 support person aged 52 or older with them. For treatment visits, patients cannot have anyone with them due to current Covid guidelines and our immunocompromised population.   Goserelin injection What is this medication? GOSERELIN (GOE se rel in) is similar to a hormone found in the body. It lowers the amount of sex hormones that the body makes. Men will have lower testosterone levels and women will have lower estrogen levels while taking this medicine. In  men, this medicine is used to treat prostate cancer; the injection is either given once per month or once every 12 weeks. A once per month injection (only) is used to treat women with endometriosis, dysfunctional uterine bleeding, or advanced breast cancer. This medicine may be used for other purposes; ask your health care provider  or pharmacist if you have questions. COMMON BRAND NAME(S): Zoladex, Zoladex 51-Month What should I tell my care team before I take this medication? They need to know if you have any of these conditions: bone problems diabetes heart disease history of irregular heartbeat an unusual or allergic reaction to goserelin, other medicines, foods, dyes, or preservatives pregnant or trying to get pregnant breast-feeding How should I use this medication? This medicine is for injection under the skin. It is given by a health care professional in a hospital or clinic setting. Talk to your pediatrician regarding the use of this medicine in children. Special care may be needed. Overdosage: If you think you have taken too much of this medicine contact a poison control center or emergency room at once. NOTE: This medicine is only for you. Do not share this medicine with others. What if I miss a dose? It is important not to miss your dose. Call your doctor or health care professional if you are unable to keep an appointment. What may interact with this medication? Do not take this medicine with any of the following medications: cisapride dronedarone pimozide thioridazine This medicine may also interact with the following medications: other medicines that prolong the QT interval (an abnormal heart rhythm) This list may not describe all possible interactions. Give your health care provider a list of all the medicines, herbs, non-prescription drugs, or dietary supplements you use. Also tell them if you smoke, drink alcohol, or use illegal drugs. Some items may interact with your medicine. What should I watch for while using this medication? Visit your doctor or health care provider for regular checks on your progress. Your symptoms may appear to get worse during the first weeks of this therapy. Tell your doctor or healthcare provider if your symptoms do not start to get better or if they get worse after this  time. Your bones may get weaker if you take this medicine for a long time. If you smoke or frequently drink alcohol you may increase your risk of bone loss. A family history of osteoporosis, chronic use of drugs for seizures (convulsions), or corticosteroids can also increase your risk of bone loss. Talk to your doctor about how to keep your bones strong. This medicine should stop regular monthly menstruation in women. Tell your doctor if you continue to menstruate. Women should not become pregnant while taking this medicine or for 12 weeks after stopping this medicine. Women should inform their doctor if they wish to become pregnant or think they might be pregnant. There is a potential for serious side effects to an unborn child. Talk to your health care professional or pharmacist for more information. Do not breast-feed an infant while taking this medicine. Men should inform their doctors if they wish to father a child. This medicine may lower sperm counts. Talk to your health care professional or pharmacist for more information. This medicine may increase blood sugar. Ask your healthcare provider if changes in diet or medicines are needed if you have diabetes. What side effects may I notice from receiving this medication? Side effects that you should report to your doctor or health care professional as soon as possible:  allergic reactions like skin rash, itching or hives, swelling of the face, lips, or tongue bone pain breathing problems changes in vision chest pain feeling faint or lightheaded, falls fever, chills pain, swelling, warmth in the leg pain, tingling, numbness in the hands or feet signs and symptoms of high blood sugar such as being more thirsty or hungry or having to urinate more than normal. You may also feel very tired or have blurry vision signs and symptoms of low blood pressure like dizziness; feeling faint or lightheaded, falls; unusually weak or tired stomach pain swelling  of the ankles, feet, hands trouble passing urine or change in the amount of urine unusually high or low blood pressure unusually weak or tired Side effects that usually do not require medical attention (report to your doctor or health care professional if they continue or are bothersome): change in sex drive or performance changes in breast size in both males and females changes in emotions or moods headache hot flashes irritation at site where injected loss of appetite skin problems like acne, dry skin vaginal dryness This list may not describe all possible side effects. Call your doctor for medical advice about side effects. You may report side effects to FDA at 1-800-FDA-1088. Where should I keep my medication? This drug is given in a hospital or clinic and will not be stored at home. NOTE: This sheet is a summary. It may not cover all possible information. If you have questions about this medicine, talk to your doctor, pharmacist, or health care provider.  2022 Elsevier/Gold Standard (2018-06-23 00:00:00)

## 2021-05-15 NOTE — Progress Notes (Signed)
[facility] Follow up:    [place], [place] @ [place] [address]   DIAGNOSIS:  Cancer Staging  Malignant neoplasm of upper-inner quadrant of right breast in female, estrogen receptor positive ([place]) Staging form: Breast, AJCC 8th Edition - Clinical stage from 07/08/2020: Stage IIIA (cT3, cN3, cM0, G1, ER+, PR+, HER2+) - Unsigned Stage prefix: Initial diagnosis Histologic grading system: 3 grade system - Pathologic stage from 01/07/2021: No Stage Recommended (ypT2, pN31m, cM0, G2) - Signed by CGardenia Phlegm NP on 01/15/2021 Stage prefix: Post-therapy Histologic grading system: 3 grade system   SUMMARY OF ONCOLOGIC HISTORY: Oncology History  Malignant neoplasm of upper-inner quadrant of right breast in female, estrogen receptor positive ([place]  07/08/2020 Initial Diagnosis   a clinical mT2 N3, stage IIIA invasive ductal carcinoma, grade 1, triple positive  (a) breast MRI 07/06/2020 finds a 1.6 cm lower inner quadrant mass associated with 5.4 cm of non-mass-like enhancement, at least 8 enlarged right axillary lymph node and 1 right internal mammary lymph node  (b) CT scans of the neck and chest 06/24/2020 shows subcentimeter right cervical adenopathy, 2 right supraclavicular lymph nodes the larger 0.65 cm, also left cervical adenopathy largest measuring 1.02 cm; there is also a 1.2 cm lesion in the left hepatic lobe, and a sclerotic lesion in the mid thoracic spine at T7  (c) PET scan 07/21/2020 shows regional adenopathy (including 2 right subcentimeter supraclavicular lymph nodes and subcentimeter lymph nodes in the right hilar, subcarinal and lateral aortic region), no uptake in liver, and an indeterminate T12 lesion  (d) thoracic spine MRI 07/27/2020 shows the T12 lesion to be likely benign, there is also degenerative disc disease   07/23/2020 - 12/18/2020 Neo-Adjuvant Chemotherapy   neoadjuvant chemotherapy will consist  of carboplatin, docetaxel, trastuzumab, and pertuzumab q. 21 days x 6 07/23/2020-11/06/2020 --two doses of Trastuzumab given on 9/22 and 10/13   01/07/2021 Cancer Staging   Staging form: Breast, AJCC 8th Edition - Pathologic stage from 01/07/2021: No Stage Recommended (ypT2, pN144m cM0, G2) - Signed by CaGardenia PhlegmNP on 01/15/2021 Stage prefix: Post-therapy Histologic grading system: 3 grade system    01/07/2021 Surgery   She underwent bilateral mastectomy on 01/07/2021 that showed: left breast no evidence of malignancy, and the right breast invasive ductal carcinoma grade 2, spanning fibrotic area of approximately 5 cm in patches.  Margins were negative.   (A) Microscopic metastatic carcinoma was noted in 1 out of 15 lymph nodes biopsied.   01/26/2021 -  Chemotherapy   Patient is on Treatment Plan : BREAST ADO-Trastuzumab Emtansine (Kadcyla) q21d     02/13/2021 - 04/01/2021 Radiation Therapy   Adjuvant radiation   04/08/2021 -  Anti-estrogen oral therapy   Antiestrogen therapy with Zolodex and AI starting 04/21/21     CURRENT THERAPY: Kadcyla, Zoladex, anastrozole  INTERVAL HISTORY: Kayla 89. female returns for evaluation prior to receiving Kadcyla, Zoladex today.  She was supposed to have started on Zoladex 3 weeks ago however did not receive this injection.  She has not yet started on anastrozole.  Overall [name] feeling well.  She completed radiation on February 27.  She says her skin is intact.  She still does not have feeling at her right mastectomy site.   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   Prolonged QT interval 07/30/2020   Bone lesion 07/23/2020   MEN 2A  (multiple endocrine neoplasia, type 2A) ([place]) 04/12/2019   Multiple thyroid nodules 04/08/2019   Family history of breast cancer    Family history of prostate cancer    Family history of lymphoma    Family history of ovarian cancer    Monoallelic mutation of CHEK2 gene in female patient    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)    Hx of varicella    Kidney stone 2012   Medullary thyroid carcinoma ([place])    Monoallelic mutation of CHEK2 gene in female patient    Postpartum care following vaginal delivery (7/27) 10/02/2014, June 16 , 2020   Second degree perineal laceration 08/23/2018   SVD (spontaneous vaginal delivery) 08/23/2018   Thyroid cancer ([place]) 2021    SURGICAL HISTORY: Past Surgical History:  Procedure Laterality Date   IR IMAGING GUIDED PORT INSERTION  07/22/2020   MODIFIED MASTECTOMY Right 01/07/2021   Procedure: RIGHT MODIFIED RADICAL MASTECTOMY;  Surgeon: Jovita Kussmaul, MD;  Location: [facility];  Service: General;  Laterality: Right;   NO PAST SURGERIES     SIMPLE MASTECTOMY WITH AXILLARY SENTINEL NODE BIOPSY Left 01/07/2021   Procedure: LEFT SIMPLE MASTECTOMY;  Surgeon: Jovita Kussmaul, MD;  Location: [facility];  Service: General;  Laterality: Left;   THYROIDECTOMY N/A 04/12/2019   Procedure: TOTAL THYROIDECTOMY WITH LIMITED LYMPH NODE DISSECTION;  Surgeon: Armandina Gemma,  MD;  Location: [facility];  Service: General;  Laterality: N/A;   UNILATERAL SALPINGECTOMY Right 07/09/2017   Procedure: RIGHT SALPINGECTOMY WITH REMOVAL OF ECTOPIC PREGNANCY;  Surgeon: Azucena Fallen, MD;  Location: [facility];  Service: Gynecology;  Laterality: Right;    SOCIAL HISTORY: Social History   Socioeconomic History   Marital status: Married    Spouse name: Not on file   Number of children: Not on file   Years of education: Not on file   Highest education level: Not on file  Occupational History   Occupation: homemaker  Tobacco Use   Smoking status: Some Days    Packs/day: 0.50    Years: 5.00    Pack years: 2.50    Types: Cigarettes   Smokeless tobacco: Never   Tobacco comments:    Actively trying to quit.  Vaping Use   Vaping Use: Never used  Substance and Sexual Activity   Alcohol use: Yes    Alcohol/week: 7.0 - 14.0 standard drinks    Types: 7 - 14 Glasses of wine per week    Comment: glass of wine each night    Drug use: Not Currently    Types: Marijuana    Comment: not during  pregnancy, last use was 03/27/2019   Sexual activity: Yes    Birth control/protection: None, I.U.D.  Other Topics Concern   Not on file  Social History Narrative   Not on file   Social Determinants of Health   Financial Resource Strain: Low Risk    Difficulty of Paying Living Expenses: Not hard at all  Food Insecurity: No Food Insecurity   Worried About Charity fundraiser in the Last Year: Never true   [place] in the Last Year: Never true  Transportation Needs: No Transportation Needs   Lack of Transportation (Medical): No   Lack of Transportation (Non-Medical): No  Physical Activity: Not on file  Stress: Not on file  Social Connections: Not on file  Intimate Partner Violence: Not on file    FAMILY HISTORY: Family History  Problem Relation Age of Onset   Breast cancer Mother 2       2 types of cancer one side mastectomy with chemo   Endometriosis Mother     Endometriosis Maternal Grandmother    Breast cancer Maternal Grandmother        post menopausal   Diabetes Maternal Grandmother    Cancer Maternal Grandfather        prostate and lymphoma   Alzheimer's disease Paternal Grandmother    Cancer Father 38       prostate cancer   Aneurysm Paternal Grandfather        d. 77s    Review of Systems  Constitutional:  Negative for appetite change, chills, fatigue, fever and unexpected weight change.  HENT:   Negative for hearing loss, lump/mass and trouble swallowing.   Eyes:  Negative for eye problems and icterus.  Respiratory:  Negative for chest tightness, cough and shortness of breath.   Cardiovascular:  Negative for chest pain, leg swelling and palpitations.  Gastrointestinal:  Negative for abdominal distention, abdominal pain, constipation, diarrhea, nausea and vomiting.  Endocrine: Negative for hot flashes.  Genitourinary:  Negative for difficulty urinating.   Musculoskeletal:  Negative for arthralgias.  Skin:  Negative for itching and rash.  Neurological:  Negative for dizziness, extremity weakness, headaches and numbness.  Hematological:  Negative for adenopathy. Does not bruise/bleed easily.  Psychiatric/Behavioral:  Negative for depression. The patient is not nervous/anxious.      PHYSICAL EXAMINATION  ECOG PERFORMANCE STATUS: 1 - Symptomatic but completely ambulatory  Vitals:   05/15/21 0956  BP: 119/69  Pulse: 99  Resp: 16  Temp: 97.7 F (36.5 C)  SpO2: 98%    Physical Exam Constitutional:      General: She is not in acute distress.    Appearance: Normal appearance. She is not toxic-appearing.  HENT:     Head: Normocephalic and atraumatic.  Eyes:     General: No scleral icterus. Cardiovascular:     Rate and Rhythm: Normal rate and regular rhythm.     Pulses: Normal pulses.     Heart sounds: Normal heart sounds.  Pulmonary:     Effort: Pulmonary effort is normal.     Breath sounds: Normal breath sounds.   Chest:     Comments: Inspected right mastectomy site since she completed radiation skin is intact that there is hyperpigmentation no visual signs of local recurrence. Abdominal:     General: Abdomen is flat. Bowel sounds are normal. There is no distension.     Palpations: Abdomen is soft.     Tenderness: There is no abdominal tenderness.  Musculoskeletal:  General: No swelling.     Cervical back: Neck supple.  Lymphadenopathy:     Cervical: No cervical adenopathy.  Skin:    General: Skin is warm and dry.     Findings: No rash.  Neurological:     General: No focal deficit present.     Mental Status: She is alert.  Psychiatric:        Mood and Affect: Mood normal.        Behavior: Behavior normal.    LABORATORY DATA:  CBC    Component Value Date/Time   WBC 7.5 05/15/2021 0941   RBC 4.25 05/15/2021 0941   HGB 14.3 05/15/2021 0941   HGB 13.6 07/08/2020 1400   HGB 12.2 02/16/2017 1332   HCT 42.8 05/15/2021 0941   HCT 37.0 02/16/2017 1332   PLT 273 05/15/2021 0941   PLT 333 07/08/2020 1400   PLT 315 02/16/2017 1332   MCV 100.7 (H) 05/15/2021 0941   MCV 98 (H) 02/16/2017 1332   MCH 33.6 05/15/2021 0941   MCHC 33.4 05/15/2021 0941   RDW 13.6 05/15/2021 0941   RDW 12.8 02/16/2017 1332   LYMPHSABS 1.5 05/15/2021 0941   LYMPHSABS 2.2 02/16/2017 1332   MONOABS 0.6 05/15/2021 0941   EOSABS 0.2 05/15/2021 0941   EOSABS 0.1 02/16/2017 1332   BASOSABS 0.1 05/15/2021 0941   BASOSABS 0.0 02/16/2017 1332    CMP     Component Value Date/Time   NA 138 05/15/2021 0941   NA 142 02/16/2017 1332   K 3.5 05/15/2021 0941   CL 106 05/15/2021 0941   CO2 27 05/15/2021 0941   GLUCOSE 133 (H) 05/15/2021 0941   BUN 11 05/15/2021 0941   BUN 9 02/16/2017 1332   CREATININE 0.77 05/15/2021 0941   CREATININE 0.73 12/18/2020 0946   CREATININE 0.82 12/04/2012 1732   CALCIUM 8.4 (L) 05/15/2021 0941   PROT 6.8 05/15/2021 0941   PROT 6.5 02/16/2017 1332   ALBUMIN 4.2 05/15/2021 0941    ALBUMIN 4.3 02/16/2017 1332   AST 27 05/15/2021 0941   AST 23 12/18/2020 0946   ALT 26 05/15/2021 0941   ALT 23 12/18/2020 0946   ALKPHOS 52 05/15/2021 0941   BILITOT 0.5 05/15/2021 0941   BILITOT 0.7 12/18/2020 0946   GFRNONAA >60 05/15/2021 0941   GFRNONAA >60 12/18/2020 0946   GFRAA >60 04/15/2019 2145      ASSESSMENT and THERAPY PLAN:   Malignant neoplasm of upper-inner quadrant of right breast in female, estrogen receptor positive ([facility]) stage IIIa triple positive breast cancer.   Treatment history: 1.  Initial diagnosis with T2N3 triple positive breast cancer PET scan showed regional adenopathy with no distant metastasis. 2.  Neoadjuvant chemotherapy with TCHP x6 from 07/23/2020 through 11/06/2020 3.  2 doses of Herceptin on 11/27/2020 and 12/18/2020 4.  Bilateral mastectomies on January 07, 2021 which showed T2 N1 MI residual cancer. 5.  Patient began adjuvant Kadcyla x14 cycles beginning January 26, 2021 6.  Adjuvant radiation beginning February 13, 2021 7.  To be followed by adjuvant antiestrogen therapy. _______________________________________________________________________________________________ Current treatment: Kadcyla maintenance Kadcyla toxicities: Echocardiogram completed 03/10/2020 EF 60-65%  Kayla Little is tolerating treatment well.  She does have some mild fatigue.  This has not impacted her activity level and she remains active.  We discussed her appointment times and she has requested to come in at 915 or later in the day.    I asked nursing to please give her Zoladex injection today through the epic chat.  I also sent in anastrozole for her to take and we discussed possible side effects of the medication.  Kayla Little will return in 3 weeks for labs, follow-up, Kadcyla.  She will then return in 4 weeks for an injection only.  She will see the provider with her Steward Drone that is due in 6 weeks.  Return to clinic every 3 weeks for Kadcyla and every 6 weeks to follow-up  with me.   All questions were answered. The patient knows to call the clinic with any problems, questions or concerns. We can certainly see the patient much sooner if necessary.  Total encounter time: 20 minutes in face-to-face visit time, chart review, lab review, care coordination, order entry, and documentation of the encounter.  Wilber Bihari, NP 05/15/21 10:33 AM Medical Oncology and Hematology [facility] [place], [place] [address] Tel. [phone]    Fax. [phone]  *Total Encounter Time as defined by the Centers for Medicare and Medicaid Services includes, in addition to the face-to-face time of a patient visit (documented in the note above) non-face-to-face time: obtaining and reviewing outside history, ordering and reviewing medications, tests or procedures, care coordination (communications with other health care professionals or caregivers) and documentation in the medical record.

## 2021-05-18 ENCOUNTER — Telehealth: Payer: Self-pay | Admitting: Adult Health

## 2021-05-18 NOTE — Telephone Encounter (Signed)
Scheduled appointment per 3/10 los. Patient is aware. ?

## 2021-05-22 ENCOUNTER — Inpatient Hospital Stay: Payer: 59

## 2021-06-02 ENCOUNTER — Other Ambulatory Visit (HOSPITAL_COMMUNITY): Payer: 59

## 2021-06-04 ENCOUNTER — Other Ambulatory Visit: Payer: Self-pay

## 2021-06-04 DIAGNOSIS — C50211 Malignant neoplasm of upper-inner quadrant of right female breast: Secondary | ICD-10-CM

## 2021-06-05 ENCOUNTER — Inpatient Hospital Stay: Payer: 59

## 2021-06-05 ENCOUNTER — Ambulatory Visit (HOSPITAL_COMMUNITY)
Admission: RE | Admit: 2021-06-05 | Discharge: 2021-06-05 | Disposition: A | Discharge status: Home / Self Care | Payer: 59 | Source: Ambulatory Visit | Attending: Adult Health | Admitting: Adult Health

## 2021-06-05 ENCOUNTER — Ambulatory Visit: Payer: 59

## 2021-06-05 ENCOUNTER — Other Ambulatory Visit: Payer: Self-pay

## 2021-06-05 ENCOUNTER — Other Ambulatory Visit: Payer: 59

## 2021-06-05 VITALS — BP 115/75 | HR 93 | Temp 98.1°F | Resp 16 | Wt 161.5 lb

## 2021-06-05 DIAGNOSIS — Z0189 Encounter for other specified special examinations: Secondary | ICD-10-CM

## 2021-06-05 DIAGNOSIS — Z170 Estrogen receptor positive status [ER+]: Secondary | ICD-10-CM | POA: Diagnosis not present

## 2021-06-05 DIAGNOSIS — Z95828 Presence of other vascular implants and grafts: Secondary | ICD-10-CM

## 2021-06-05 DIAGNOSIS — Z01818 Encounter for other preprocedural examination: Secondary | ICD-10-CM | POA: Diagnosis present

## 2021-06-05 DIAGNOSIS — C50211 Malignant neoplasm of upper-inner quadrant of right female breast: Secondary | ICD-10-CM | POA: Insufficient documentation

## 2021-06-05 DIAGNOSIS — Z5112 Encounter for antineoplastic immunotherapy: Secondary | ICD-10-CM | POA: Diagnosis not present

## 2021-06-05 LAB — CMP (CANCER CENTER ONLY)
ALT: 28 U/L (ref 0–44)
AST: 32 U/L (ref 15–41)
Albumin: 4.2 g/dL (ref 3.5–5.0)
Alkaline Phosphatase: 52 U/L (ref 38–126)
Anion gap: 6 (ref 5–15)
BUN: 8 mg/dL (ref 6–20)
CO2: 26 mmol/L (ref 22–32)
Calcium: 8.4 mg/dL — ABNORMAL LOW (ref 8.9–10.3)
Chloride: 107 mmol/L (ref 98–111)
Creatinine: 0.73 mg/dL (ref 0.44–1.00)
GFR, Estimated: >60 mL/min (ref >60)
Glucose, Bld: 119 mg/dL — ABNORMAL HIGH (ref 70–99)
Potassium: 4 mmol/L (ref 3.5–5.1)
Sodium: 139 mmol/L (ref 135–145)
Total Bilirubin: 0.4 mg/dL (ref 0.3–1.2)
Total Protein: 7.2 g/dL (ref 6.5–8.1)

## 2021-06-05 LAB — CBC WITH DIFFERENTIAL (CANCER CENTER ONLY)
Abs Immature Granulocytes: 0.03 10*3/uL (ref 0.00–0.07)
Basophils Absolute: 0.1 10*3/uL (ref 0.0–0.1)
Basophils Relative: 1 %
Eosinophils Absolute: 0.2 10*3/uL (ref 0.0–0.5)
Eosinophils Relative: 3 %
HCT: 42.8 % (ref 36.0–46.0)
Hemoglobin: 14.3 g/dL (ref 12.0–15.0)
Immature Granulocytes: 0 %
Lymphocytes Relative: 26 %
Lymphs Abs: 1.9 10*3/uL (ref 0.7–4.0)
MCH: 33.4 pg (ref 26.0–34.0)
MCHC: 33.4 g/dL (ref 30.0–36.0)
MCV: 100 fL (ref 80.0–100.0)
Monocytes Absolute: 0.8 10*3/uL (ref 0.1–1.0)
Monocytes Relative: 10 %
Neutro Abs: 4.5 10*3/uL (ref 1.7–7.7)
Neutrophils Relative %: 60 %
Platelet Count: 277 10*3/uL (ref 150–400)
RBC: 4.28 MIL/uL (ref 3.87–5.11)
RDW: 13 % (ref 11.5–15.5)
WBC Count: 7.5 10*3/uL (ref 4.0–10.5)
nRBC: 0 % (ref 0.0–0.2)

## 2021-06-05 LAB — ECHOCARDIOGRAM COMPLETE
Area-P 1/2: 3.12 cm2
S' Lateral: 3.5 cm

## 2021-06-05 MED ORDER — ACETAMINOPHEN 325 MG PO TABS
650.0000 mg | ORAL_TABLET | Freq: Once | ORAL | Status: AC
  Administered 2021-06-05: 650 mg via ORAL
  Filled 2021-06-05: qty 2

## 2021-06-05 MED ORDER — DIPHENHYDRAMINE HCL 25 MG PO CAPS
25.0000 mg | ORAL_CAPSULE | Freq: Once | ORAL | Status: AC
  Administered 2021-06-05: 25 mg via ORAL
  Filled 2021-06-05: qty 1

## 2021-06-05 MED ORDER — SODIUM CHLORIDE 0.9 % IV SOLN
3.4000 mg/kg | Freq: Once | INTRAVENOUS | Status: AC
  Administered 2021-06-05: 260 mg via INTRAVENOUS
  Filled 2021-06-05: qty 5

## 2021-06-05 MED ORDER — HEPARIN SOD (PORK) LOCK FLUSH 100 UNIT/ML IV SOLN
500.0000 [IU] | Freq: Once | INTRAVENOUS | Status: AC | PRN
  Administered 2021-06-05: 500 [IU]

## 2021-06-05 MED ORDER — SODIUM CHLORIDE 0.9% FLUSH
10.0000 mL | INTRAVENOUS | Status: DC | PRN
  Administered 2021-06-05: 10 mL

## 2021-06-05 MED ORDER — SODIUM CHLORIDE 0.9% FLUSH
10.0000 mL | Freq: Once | INTRAVENOUS | Status: AC
  Administered 2021-06-05: 10 mL

## 2021-06-05 MED ORDER — SODIUM CHLORIDE 0.9 % IV SOLN: Freq: Once | INTRAVENOUS | Status: AC

## 2021-06-05 NOTE — Progress Notes (Signed)
Pt informed RN that she has had new onset light spotting off and on since receiving the Zoldex injection at her last inf appt. Pt stated "it is not heavy enough for a pad". ?RN made MD aware of this change. ?

## 2021-06-05 NOTE — Progress Notes (Signed)
?  Echocardiogram ?2D Echocardiogram has been performed. ? Kayla Little ?06/05/2021, 9:59 AM ?

## 2021-06-05 NOTE — Progress Notes (Signed)
Patient declined to stay for 30 minutes observation period following administration of Kadcyla infusion. Vital signs retaken and remained stable. Patient showed no signs of distress upon discharge. ? ?

## 2021-06-05 NOTE — Patient Instructions (Signed)
[facility] MEDICAL ONCOLOGY  Discharge Instructions: °Thank you for choosing [facility] to provide your oncology and hematology care.  ° °If you have a lab appointment with the Cancer Center, please go directly to the Cancer Center and check in at the registration area. °  °Wear comfortable clothing and clothing appropriate for easy access to any Portacath or PICC line.  ° °We strive to give you quality time with your provider. You may need to reschedule your appointment if you arrive late (15 or more minutes).  Arriving late affects you and other patients whose appointments are after yours.  Also, if you miss three or more appointments without notifying the office, you may be dismissed from the clinic at the provider’s discretion.    °  °For prescription refill requests, have your pharmacy contact our office and allow 72 hours for refills to be completed.   ° °Today you received the following chemotherapy and/or immunotherapy agents Kadcyla    °  °To help prevent nausea and vomiting after your treatment, we encourage you to take your nausea medication as directed. ° °BELOW ARE SYMPTOMS THAT SHOULD BE REPORTED IMMEDIATELY: °*FEVER GREATER THAN 100.4 F (38 °C) OR HIGHER °*CHILLS OR SWEATING °*NAUSEA AND VOMITING THAT IS NOT CONTROLLED WITH YOUR NAUSEA MEDICATION °*UNUSUAL SHORTNESS OF BREATH °*UNUSUAL BRUISING OR BLEEDING °*URINARY PROBLEMS (pain or burning when urinating, or frequent urination) °*BOWEL PROBLEMS (unusual diarrhea, constipation, pain near the anus) °TENDERNESS IN MOUTH AND THROAT WITH OR WITHOUT PRESENCE OF ULCERS (sore throat, sores in mouth, or a toothache) °UNUSUAL RASH, SWELLING OR PAIN  °UNUSUAL VAGINAL DISCHARGE OR ITCHING  ° °Items with * indicate a potential emergency and should be followed up as soon as possible or go to the Emergency Department if any problems should occur. ° °Please show the CHEMOTHERAPY ALERT CARD or IMMUNOTHERAPY ALERT CARD at check-in to the  Emergency Department and triage nurse. ° °Should you have questions after your visit or need to cancel or reschedule your appointment, please contact [facility] MEDICAL ONCOLOGY  Dept: [phone]  and follow the prompts.  Office hours are 8:00 a.m. to 4:30 p.m. Monday - Friday. Please note that voicemails left after 4:00 p.m. may not be returned until the following business day.  We are closed weekends and major holidays. You have access to a nurse at all times for urgent questions. Please call the main number to the clinic Dept: [phone] and follow the prompts. ° ° °For any non-urgent questions, you may also contact your provider using MyChart. We now offer e-Visits for anyone 18 and older to request care online for non-urgent symptoms. For details visit [email or web address]. °  °Also download the MyChart app! Go to the app store, search "MyChart", open the app, select [facility], and log in with your MyChart username and password. ° °Due to Covid, a mask is required upon entering the hospital/clinic. If you do not have a mask, one will be given to you upon arrival. For doctor visits, patients may have 1 support person aged 18 or older with them. For treatment visits, patients cannot have anyone with them due to current Covid guidelines and our immunocompromised population.  ° °

## 2021-06-06 LAB — CALCIUM, IONIZED: Calcium, Ionized, Serum: 4.6 mg/dL (ref 4.5–5.6)

## 2021-06-12 ENCOUNTER — Inpatient Hospital Stay: Payer: 59 | Attending: Hematology and Oncology

## 2021-06-12 DIAGNOSIS — Z923 Personal history of irradiation: Secondary | ICD-10-CM | POA: Insufficient documentation

## 2021-06-12 DIAGNOSIS — Z9221 Personal history of antineoplastic chemotherapy: Secondary | ICD-10-CM | POA: Insufficient documentation

## 2021-06-12 DIAGNOSIS — F17210 Nicotine dependence, cigarettes, uncomplicated: Secondary | ICD-10-CM | POA: Insufficient documentation

## 2021-06-12 DIAGNOSIS — C50211 Malignant neoplasm of upper-inner quadrant of right female breast: Secondary | ICD-10-CM | POA: Insufficient documentation

## 2021-06-12 DIAGNOSIS — Z85850 Personal history of malignant neoplasm of thyroid: Secondary | ICD-10-CM | POA: Insufficient documentation

## 2021-06-12 DIAGNOSIS — Z9013 Acquired absence of bilateral breasts and nipples: Secondary | ICD-10-CM | POA: Insufficient documentation

## 2021-06-12 DIAGNOSIS — Z170 Estrogen receptor positive status [ER+]: Secondary | ICD-10-CM | POA: Insufficient documentation

## 2021-06-12 DIAGNOSIS — Z5111 Encounter for antineoplastic chemotherapy: Secondary | ICD-10-CM | POA: Insufficient documentation

## 2021-06-19 ENCOUNTER — Ambulatory Visit: Payer: 59

## 2021-06-24 ENCOUNTER — Other Ambulatory Visit: Payer: Self-pay | Admitting: *Deleted

## 2021-06-24 DIAGNOSIS — C50211 Malignant neoplasm of upper-inner quadrant of right female breast: Secondary | ICD-10-CM

## 2021-06-25 ENCOUNTER — Encounter: Payer: Self-pay | Admitting: *Deleted

## 2021-06-26 ENCOUNTER — Ambulatory Visit: Payer: 59 | Admitting: Hematology and Oncology

## 2021-06-26 ENCOUNTER — Other Ambulatory Visit: Payer: 59

## 2021-06-26 ENCOUNTER — Inpatient Hospital Stay (HOSPITAL_BASED_OUTPATIENT_CLINIC_OR_DEPARTMENT_OTHER): Payer: 59 | Admitting: Adult Health

## 2021-06-26 ENCOUNTER — Ambulatory Visit: Payer: 59

## 2021-06-26 ENCOUNTER — Inpatient Hospital Stay: Payer: 59

## 2021-06-26 ENCOUNTER — Encounter: Payer: Self-pay | Admitting: Adult Health

## 2021-06-26 ENCOUNTER — Other Ambulatory Visit: Payer: Self-pay

## 2021-06-26 VITALS — HR 95

## 2021-06-26 VITALS — BP 126/89 | HR 101 | Temp 97.2°F | Resp 17 | Wt 162.2 lb

## 2021-06-26 DIAGNOSIS — C50211 Malignant neoplasm of upper-inner quadrant of right female breast: Secondary | ICD-10-CM

## 2021-06-26 DIAGNOSIS — Z170 Estrogen receptor positive status [ER+]: Secondary | ICD-10-CM

## 2021-06-26 DIAGNOSIS — Z95828 Presence of other vascular implants and grafts: Secondary | ICD-10-CM

## 2021-06-26 DIAGNOSIS — Z85850 Personal history of malignant neoplasm of thyroid: Secondary | ICD-10-CM | POA: Diagnosis not present

## 2021-06-26 DIAGNOSIS — Z5112 Encounter for antineoplastic immunotherapy: Secondary | ICD-10-CM | POA: Diagnosis present

## 2021-06-26 DIAGNOSIS — F17210 Nicotine dependence, cigarettes, uncomplicated: Secondary | ICD-10-CM | POA: Diagnosis not present

## 2021-06-26 DIAGNOSIS — Z9013 Acquired absence of bilateral breasts and nipples: Secondary | ICD-10-CM | POA: Diagnosis not present

## 2021-06-26 DIAGNOSIS — Z9221 Personal history of antineoplastic chemotherapy: Secondary | ICD-10-CM | POA: Diagnosis not present

## 2021-06-26 DIAGNOSIS — Z923 Personal history of irradiation: Secondary | ICD-10-CM | POA: Diagnosis not present

## 2021-06-26 DIAGNOSIS — Z5111 Encounter for antineoplastic chemotherapy: Secondary | ICD-10-CM | POA: Diagnosis not present

## 2021-06-26 LAB — CMP (CANCER CENTER ONLY)
ALT: 29 U/L (ref 0–44)
AST: 34 U/L (ref 15–41)
Albumin: 4.3 g/dL (ref 3.5–5.0)
Alkaline Phosphatase: 56 U/L (ref 38–126)
Anion gap: 7 (ref 5–15)
BUN: 11 mg/dL (ref 6–20)
CO2: 26 mmol/L (ref 22–32)
Calcium: 8.5 mg/dL — ABNORMAL LOW (ref 8.9–10.3)
Chloride: 103 mmol/L (ref 98–111)
Creatinine: 0.89 mg/dL (ref 0.44–1.00)
GFR, Estimated: >60 mL/min (ref >60)
Glucose, Bld: 133 mg/dL — ABNORMAL HIGH (ref 70–99)
Potassium: 3.4 mmol/L — ABNORMAL LOW (ref 3.5–5.1)
Sodium: 136 mmol/L (ref 135–145)
Total Bilirubin: 0.4 mg/dL (ref 0.3–1.2)
Total Protein: 7.2 g/dL (ref 6.5–8.1)

## 2021-06-26 LAB — CBC WITH DIFFERENTIAL (CANCER CENTER ONLY)
Abs Immature Granulocytes: 0.01 10*3/uL (ref 0.00–0.07)
Basophils Absolute: 0.1 10*3/uL (ref 0.0–0.1)
Basophils Relative: 1 %
Eosinophils Absolute: 0.1 10*3/uL (ref 0.0–0.5)
Eosinophils Relative: 2 %
HCT: 42.4 % (ref 36.0–46.0)
Hemoglobin: 14.6 g/dL (ref 12.0–15.0)
Immature Granulocytes: 0 %
Lymphocytes Relative: 21 %
Lymphs Abs: 1.8 10*3/uL (ref 0.7–4.0)
MCH: 34 pg (ref 26.0–34.0)
MCHC: 34.4 g/dL (ref 30.0–36.0)
MCV: 98.6 fL (ref 80.0–100.0)
Monocytes Absolute: 0.8 10*3/uL (ref 0.1–1.0)
Monocytes Relative: 9 %
Neutro Abs: 5.8 10*3/uL (ref 1.7–7.7)
Neutrophils Relative %: 67 %
Platelet Count: 262 10*3/uL (ref 150–400)
RBC: 4.3 MIL/uL (ref 3.87–5.11)
RDW: 13.1 % (ref 11.5–15.5)
WBC Count: 8.6 10*3/uL (ref 4.0–10.5)
nRBC: 0 % (ref 0.0–0.2)

## 2021-06-26 MED ORDER — HEPARIN SOD (PORK) LOCK FLUSH 100 UNIT/ML IV SOLN
500.0000 [IU] | Freq: Once | INTRAVENOUS | Status: AC | PRN
  Administered 2021-06-26: 500 [IU]

## 2021-06-26 MED ORDER — SODIUM CHLORIDE 0.9% FLUSH
10.0000 mL | INTRAVENOUS | Status: DC | PRN
  Administered 2021-06-26: 10 mL

## 2021-06-26 MED ORDER — GOSERELIN ACETATE 3.6 MG ~~LOC~~ IMPL
3.6000 mg | DRUG_IMPLANT | Freq: Once | SUBCUTANEOUS | Status: AC
  Administered 2021-06-26: 3.6 mg via SUBCUTANEOUS
  Filled 2021-06-26: qty 3.6

## 2021-06-26 MED ORDER — DIPHENHYDRAMINE HCL 25 MG PO CAPS
25.0000 mg | ORAL_CAPSULE | Freq: Once | ORAL | Status: AC
  Administered 2021-06-26: 25 mg via ORAL
  Filled 2021-06-26: qty 1

## 2021-06-26 MED ORDER — SODIUM CHLORIDE 0.9 % IV SOLN: Freq: Once | INTRAVENOUS | Status: AC

## 2021-06-26 MED ORDER — SODIUM CHLORIDE 0.9% FLUSH
10.0000 mL | Freq: Once | INTRAVENOUS | Status: AC
  Administered 2021-06-26: 10 mL

## 2021-06-26 MED ORDER — ACETAMINOPHEN 325 MG PO TABS
650.0000 mg | ORAL_TABLET | Freq: Once | ORAL | Status: AC
  Administered 2021-06-26: 650 mg via ORAL
  Filled 2021-06-26: qty 2

## 2021-06-26 MED ORDER — SODIUM CHLORIDE 0.9 % IV SOLN
3.4000 mg/kg | Freq: Once | INTRAVENOUS | Status: AC
  Administered 2021-06-26: 260 mg via INTRAVENOUS
  Filled 2021-06-26: qty 8

## 2021-06-26 NOTE — Assessment & Plan Note (Signed)
stage IIIa triple positive breast cancer. ?? ?Treatment history: ?1.  Initial diagnosis with T2N3 triple positive breast cancer PET scan showed regional adenopathy with no distant metastasis. ?2.  Neoadjuvant chemotherapy with TCHP x6 from 07/23/2020 through 11/06/2020 ?3.  2 doses of Herceptin on 11/27/2020 and 12/18/2020 ?4.  Bilateral mastectomies on January 07, 2021 which showed T2 N1 MI residual cancer. ?5.  Patient began adjuvant Kadcyla x14 cycles beginning January 26, 2021 ?6.  Adjuvant radiation beginning February 13, 2021 ?7.  To be followed by adjuvant antiestrogen therapy. ?_______________________________________________________________________________________________ ?Current treatment: Kadcyla maintenance; Zoladex; anastrozole ?Kadcyla toxicities: ?Echocardiogram completed 06/05/2021 EF 60-65% ? ?Joellen Jersey continues on anastrozole daily.  She also is taking Zoladex and tolerates this well.  She will continue with her current treatment.  We reviewed her labs today which remained stable. ? ?Return to clinic every 3 weeks for Kadcyla and every 6 weeks to follow-up. ?

## 2021-06-26 NOTE — Progress Notes (Signed)
[facility]: ?  ? ?[facility] ?[place] [address] ? ? ?DIAGNOSIS:  Cancer Staging  ?Malignant neoplasm of upper-inner quadrant of right breast in female, estrogen receptor positive ([facility]) ?Staging form: Breast, AJCC 8th Edition ?- Clinical stage from 07/08/2020: Stage IIIA (cT3, cN3, cM0, G1, ER+, PR+, HER2+) - Unsigned ?Stage prefix: Initial diagnosis ?Histologic grading system: 3 grade system ?- Pathologic stage from 01/07/2021: No Stage Recommended (ypT2, pN58m, cM0, G2) - Signed by CGardenia Phlegm NP on 01/15/2021 ?Stage prefix: Post-therapy ?Histologic grading system: 3 grade system ? ? ?SUMMARY OF ONCOLOGIC HISTORY: ?Oncology History  ?Malignant neoplasm of upper-inner quadrant of right breast in female, estrogen receptor positive ([facility]  ?07/08/2020 Initial Diagnosis  ? a clinical mT2 N3, stage IIIA invasive ductal carcinoma, grade 1, triple positive ? (a) breast MRI 07/06/2020 finds a 1.6 cm lower inner quadrant mass associated with 5.4 cm of non-mass-like enhancement, at least 8 enlarged right axillary lymph node and 1 right internal mammary lymph node ? (b) CT scans of the neck and chest 06/24/2020 shows subcentimeter right cervical adenopathy, 2 right supraclavicular lymph nodes the larger 0.65 cm, also left cervical adenopathy largest measuring 1.02 cm; there is also a 1.2 cm lesion in the left hepatic lobe, and a sclerotic lesion in the mid thoracic spine at T7 ? (c) PET scan 07/21/2020 shows regional adenopathy (including 2 right subcentimeter supraclavicular lymph nodes and subcentimeter lymph nodes in the right hilar, subcarinal and lateral aortic region), no uptake in liver, and an indeterminate T12 lesion ? (d) thoracic spine MRI 07/27/2020 shows the T12 lesion to be likely benign, there is also degenerative disc disease ?  ?07/23/2020 - 12/18/2020 Neo-Adjuvant Chemotherapy  ? neoadjuvant chemotherapy will consist  of carboplatin, docetaxel, trastuzumab, and pertuzumab q. 21 days x 6 07/23/2020-11/06/2020 ?--two doses of Trastuzumab given on 9/22 and 10/13 ?  ?01/07/2021 Cancer Staging  ? Staging form: Breast, AJCC 8th Edition ?- Pathologic stage from 01/07/2021: No Stage Recommended (ypT2, pN136m cM0, G2) - Signed by CaGardenia PhlegmNP on 01/15/2021 ?Stage prefix: Post-therapy ?Histologic grading system: 3 grade system ? ?  ?01/07/2021 Surgery  ? 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  Margins were negative.   ?(A) Microscopic metastatic carcinoma was noted in 1 out of 15 lymph nodes biopsied. ?  ?01/26/2021 -  Chemotherapy  ? Patient is on Treatment Plan : BREAST ADO-Trastuzumab Emtansine (Kadcyla) q21d  ? ?  ?  ?02/13/2021 - 04/01/2021 Radiation Therapy  ? Adjuvant radiation ?  ?04/08/2021 -  Anti-estrogen oral therapy  ? Antiestrogen therapy with Zolodex and AI starting 04/21/21 ?  ? ? ?CURRENT THERAPY: Anastrozole/Kadcyla/Zoladex ? ?INTERVAL HISTORY: ?Kayla COYNE89.o. female returns for follow-up and evaluation prior to receiving her Kadcyla treatment.  She continues on this every 3 weeks with good tolerance.  She also continues taking anastrozole and denies any significant issues with this.  She notes that she is feeling well overall. ? ?Her most recent echocardiogram was completed on June 05, 2021 and showed a normal ejection fraction of 60 to 65%. ? ? ?Patient Active Problem List  ? Diagnosis Date Noted  ? Port-A-Cath in place 03/10/2021  ? Anxiety 01/23/2021  ? Attention deficit disorder 01/23/2021  ? History of medullary carcinoma of thyroid  01/23/2021  ? History of substance abuse ([place]) 01/23/2021  ? Hypoparathyroidism ([place]) 01/23/2021  ? 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  ? Prolonged QT interval 07/30/2020  ? Bone lesion 07/23/2020  ?  MEN 2A (multiple endocrine neoplasia, type 2A) ([place]) 04/12/2019  ? Multiple thyroid nodules 04/08/2019  ? Family history of breast cancer   ? Family history of prostate cancer   ? Family history of lymphoma   ? Family history of ovarian cancer   ? Monoallelic mutation of CHEK2 gene in female patient   ? 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 Malignant neoplasm of upper-inner quadrant of right breast in female, estrogen receptor positive ([facility]) 08/22/2018  ? Type O blood, Rh negative 10/08/2014  ? ? ?has No Known Allergies. ? ?MEDICAL HISTORY: ?Past Medical History:  ?Diagnosis Date  ? ADD (attention deficit disorder)   ? Breast cancer ([facility]) 06/30/2020  ? Breast lump   ? R breast  ? Depression   ? Family history of breast cancer   ? Family history of lymphoma   ? Family history of ovarian cancer   ? Family history of prostate cancer   ? 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 Hx of varicella   ? Kidney stone 2012  ? Medullary thyroid carcinoma ([place])   ? Monoallelic mutation of CHEK2 gene in female patient   ? Postpartum care following vaginal delivery (7/27) 10/02/2014, June 16 , 2020  ? Second degree perineal laceration 08/23/2018  ? SVD (spontaneous vaginal delivery) 08/23/2018  ? Thyroid cancer ([place]) 2021  ? ? ?SURGICAL HISTORY: ?Past Surgical History:  ?Procedure Laterality Date  ? IR IMAGING GUIDED PORT INSERTION  07/22/2020  ? MODIFIED MASTECTOMY Right 01/07/2021  ? Procedure: RIGHT MODIFIED RADICAL MASTECTOMY;  Surgeon: Jovita Kussmaul, MD;  Location: [facility];  Service: General;  Laterality: Right;  ? NO PAST SURGERIES    ? SIMPLE MASTECTOMY WITH AXILLARY SENTINEL NODE BIOPSY Left 01/07/2021  ? Procedure: LEFT SIMPLE MASTECTOMY;  Surgeon: Jovita Kussmaul, MD;  Location: [facility];  Service: General;  Laterality: Left;  ? THYROIDECTOMY N/A 04/12/2019  ? Procedure: TOTAL THYROIDECTOMY WITH LIMITED LYMPH NODE DISSECTION;  Surgeon:  Armandina Gemma, MD;  [facility];  Service: General;  Laterality: N/A;  ? UNILATERAL SALPINGECTOMY Right 07/09/2017  ? Procedure: RIGHT SALPINGECTOMY WITH REMOVAL OF ECTOPIC PREGNANCY;  Surgeon: Azucena Fallen, MD;  Location: [facility];  Service: Gynecology;  Laterality: Right;  ? ? ?SOCIAL HISTORY: ?Social History  ? ?Socioeconomic History  ? Marital status: Married  ?  Spouse name: Not on file  ? Number of children: Not on file  ? Years of education: Not on file  ? Highest education level: Not on file  ?Occupational History  ? Occupation: homemaker  ?Tobacco Use  ? Smoking status: Some Days  ?  Packs/day: 0.50  ?  Years: 5.00  ?  Pack years: 2.50  ?  Types: Cigarettes  ? Smokeless tobacco: Never  ? Tobacco comments:  ?  Actively trying to quit.  ?Vaping Use  ? Vaping Use: Never used  ?Substance and Sexual Activity  ? Alcohol use: Yes  ?  Alcohol/week: 7.0 - 14.0 standard drinks  ?  Types: 7 - 14 Glasses of wine per week  ?  Comment: glass of wine each night   ? Drug use: Not Currently  ?  Types: Marijuana  ?  Comment: not during pregnancy, last  use was 03/27/2019  ? Sexual activity: Yes  ?  Birth control/protection: None, I.U.D.  ?Other Topics Concern  ? Not on file  ?Social History Narrative  ? Not on file  ? ?Social Determinants of Health  ? ?Financial Resource Strain: Low Risk   ? Difficulty of Paying Living Expenses: Not hard at all  ?Food Insecurity: No Food Insecurity  ? Worried About Charity fundraiser in the Last Year: Never true  ? Ran Out of Food in the Last Year: Never true  ?Transportation Needs: No Transportation Needs  ? Lack of Transportation (Medical): No  ? Lack of Transportation (Non-Medical): No  ?Physical Activity: Not on file  ?Stress: Not on file  ?Social Connections: Not on file  ?Intimate Partner Violence: Not on file  ? ? ?FAMILY HISTORY: ?Family History  ?Problem Relation Age of Onset  ? Breast cancer Mother 70  ?     2 types of cancer one side mastectomy with chemo  ? Endometriosis Mother    ? Endometriosis Maternal Grandmother   ? Breast cancer Maternal Grandmother   ?     post menopausal  ? Diabetes Maternal Grandmother   ? Cancer Maternal Grandfather   ?     prostate and lymphoma  ? [place]

## 2021-06-26 NOTE — Patient Instructions (Signed)
[facility]  Discharge Instructions: °Thank you for choosing [facility] to provide your oncology and hematology care.  ° °If you have a lab appointment with the Cancer Center, please go directly to the Cancer Center and check in at the registration area. °  °Wear comfortable clothing and clothing appropriate for easy access to any Portacath or PICC line.  ° °We strive to give you quality time with your provider. You may need to reschedule your appointment if you arrive late (15 or more minutes).  Arriving late affects you and other patients whose appointments are after yours.  Also, if you miss three or more appointments without notifying the office, you may be dismissed from the clinic at the provider’s discretion.    °  °For prescription refill requests, have your pharmacy contact our office and allow 72 hours for refills to be completed.   ° °Today you received the following chemotherapy and/or immunotherapy agents Kadcyla    °  °To help prevent nausea and vomiting after your treatment, we encourage you to take your nausea medication as directed. ° °BELOW ARE SYMPTOMS THAT SHOULD BE REPORTED IMMEDIATELY: °*FEVER GREATER THAN 100.4 F (38 °C) OR HIGHER °*CHILLS OR SWEATING °*NAUSEA AND VOMITING THAT IS NOT CONTROLLED WITH YOUR NAUSEA MEDICATION °*UNUSUAL SHORTNESS OF BREATH °*UNUSUAL BRUISING OR BLEEDING °*URINARY PROBLEMS (pain or burning when urinating, or frequent urination) °*BOWEL PROBLEMS (unusual diarrhea, constipation, pain near the anus) °TENDERNESS IN MOUTH AND THROAT WITH OR WITHOUT PRESENCE OF ULCERS (sore throat, sores in mouth, or a toothache) °UNUSUAL RASH, SWELLING OR PAIN  °UNUSUAL VAGINAL DISCHARGE OR ITCHING  ° °Items with * indicate a potential emergency and should be followed up as soon as possible or go to the Emergency Department if any problems should occur. ° °Please show the CHEMOTHERAPY ALERT CARD or IMMUNOTHERAPY ALERT CARD at check-in to the  Emergency Department and triage nurse. ° °Should you have questions after your visit or need to cancel or reschedule your appointment, please contact [facility]  Dept: [phone]  and follow the prompts.  Office hours are 8:00 a.m. to 4:30 p.m. Monday - Friday. Please note that voicemails left after 4:00 p.m. may not be returned until the following business day.  We are closed weekends and major holidays. You have access to a nurse at all times for urgent questions. Please call the main number to the clinic Dept: [phone] and follow the prompts. ° ° °For any non-urgent questions, you may also contact your provider using MyChart. We now offer e-Visits for anyone 18 and older to request care online for non-urgent symptoms. For details visit [email or web address]. °  °Also download the MyChart app! Go to the app store, search "MyChart", open the app, select [facility], and log in with your MyChart username and password. ° °Due to Covid, a mask is required upon entering the hospital/clinic. If you do not have a mask, one will be given to you upon arrival. For doctor visits, patients may have 1 support Jaiya Mooradian aged 38 or older with them. For treatment visits, patients cannot have anyone with them due to current Covid guidelines and our immunocompromised population.  ° °

## 2021-06-28 LAB — CALCIUM, IONIZED: Calcium, Ionized, Serum: 4.6 mg/dL (ref 4.5–5.6)

## 2021-07-03 ENCOUNTER — Telehealth: Payer: Self-pay | Admitting: *Deleted

## 2021-07-03 NOTE — Telephone Encounter (Signed)
Received call from pharmacy stating pt upcoming Zoladex injection 07/10/21 will be too soon.  Pt last received Zoladex 06/26/21.  RN placed call to pt to alert pt that 07/10/21 appt will be canceled.  RN offered to schedule pt for Zoladex injection on 07/27/21.  Pt states she will be out of town that entire week and wants to wait to receive the Zoladex injection when she receives her tx on 08/07/21.  MD notified and verbalized okay to proceed with 08/07/21 date.  ?

## 2021-07-10 ENCOUNTER — Inpatient Hospital Stay: Payer: 59

## 2021-07-13 ENCOUNTER — Ambulatory Visit: Payer: 59 | Attending: General Surgery

## 2021-07-13 VITALS — Wt 162.0 lb

## 2021-07-13 DIAGNOSIS — Z483 Aftercare following surgery for neoplasm: Secondary | ICD-10-CM | POA: Insufficient documentation

## 2021-07-13 NOTE — Therapy (Signed)
?OUTPATIENT PHYSICAL THERAPY SOZO SCREENING NOTE ? ? ?Patient Name: Kayla Little ?MRN: [id number] ?DOB:[birth year], 36 y.o., female ?Today's Date: 07/13/2021 ? ?PCP: [name] ?REFERRING PROVIDER: Jovita Kussmaul, MD ? ? PT End of Session - 07/13/21 1613   ? ? Visit Number 14   # unchanged due to screen only  ? PT Start Time 1611   ? PT Stop Time 4010   ? PT Time Calculation (min) 7 min   ? Activity Tolerance Patient tolerated treatment well   ? Behavior During Therapy [facility] for tasks assessed/performed   ? ?  ?  ? ?  ? ? ?Past Medical History:  ?Diagnosis Date  ? ADD (attention deficit disorder)   ? Breast cancer ([place]) 06/30/2020  ? Breast lump   ? R breast  ? Depression   ? Family history of breast cancer   ? Family history of lymphoma   ? Family history of ovarian cancer   ? Family history of prostate cancer   ? 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 Hx of varicella   ? Kidney stone 2012  ? Medullary thyroid carcinoma ([place])   ? Monoallelic mutation of CHEK2 gene in female patient   ? Postpartum care following vaginal delivery (7/27) 10/02/2014, June 16 , 2020  ? Second degree perineal laceration 08/23/2018  ? SVD (spontaneous vaginal delivery) 08/23/2018  ? Thyroid cancer ([place]) 2021  ? ?Past Surgical History:  ?Procedure Laterality Date  ? IR IMAGING GUIDED PORT INSERTION  07/22/2020  ? MODIFIED MASTECTOMY Right 01/07/2021  ? Procedure: RIGHT MODIFIED RADICAL MASTECTOMY;  Surgeon: Jovita Kussmaul, MD;  Location: [facility];  Service: General;  Laterality: Right;  ? NO PAST SURGERIES    ? SIMPLE MASTECTOMY WITH AXILLARY SENTINEL NODE BIOPSY Left 01/07/2021  ? Procedure: LEFT SIMPLE MASTECTOMY;  Surgeon: Jovita Kussmaul, MD;  Location: [facility];  Service: General;  Laterality: Left;  ? THYROIDECTOMY N/A 04/12/2019  ? Procedure: TOTAL THYROIDECTOMY WITH LIMITED LYMPH NODE DISSECTION;  Surgeon: Armandina Gemma, MD;  [facility];  Service:  General;  Laterality: N/A;  ? UNILATERAL SALPINGECTOMY Right 07/09/2017  ? Procedure: RIGHT SALPINGECTOMY WITH REMOVAL OF ECTOPIC PREGNANCY;  Surgeon: Azucena Fallen, MD;  Location: [facility];  Service: Gynecology;  Laterality: Right;  ? ?Patient Active Problem List  ? Diagnosis Date Noted  ? Port-A-Cath in place 03/10/2021  ? Anxiety 01/23/2021  ? Attention deficit disorder 01/23/2021  ? History of medullary carcinoma of thyroid 01/23/2021  ? History of substance abuse ([place]) 01/23/2021  ? Hypoparathyroidism ([facility]) 01/23/2021  ? 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  ? Prolonged QT interval 07/30/2020  ? Bone lesion 07/23/2020  ? MEN 2A (multiple endocrine neoplasia, type 2A) ([facility]) 04/12/2019  ? Multiple thyroid nodules 04/08/2019  ? Family history of breast cancer   ? Family history of prostate cancer   ? Family history of lymphoma   ? Family history of ovarian cancer   ? Monoallelic mutation of CHEK2 gene in female patient   ? 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 Malignant neoplasm of upper-inner quadrant of right breast in female, estrogen receptor positive ([facility] [place]) 08/22/2018  ? Type O blood, Rh negative 10/08/2014  ? ? ?REFERRING DIAG: right breast cancer at risk for lymphedema ? ?THERAPY DIAG:  ?Aftercare following surgery for neoplasm ? ?PERTINENT HISTORY: Patient was diagnosed on 06/27/2020 with right grade I invasive ductal carcinoma  breast cancer. It is triple positive. She underwent neoadjuvant chemotherapy from 07/23/2020-11/06/2020 followed by a bilateral mastectomy with a right axillary lymph node dissection (1/15 nodes positive). She has a hx of medullary thyroid cancer with a thyroidectomy 04/12/2019. She smokes cigarettes infrequently and drinks red wine daily with varying amounts 0-3 glasses.  ? ?PRECAUTIONS: right UE Lymphedema risk, None ? ?SUBJECTIVE: Pt returns for her 3 month L-Dex screen.  ? ?PAIN:  ?Are you having pain? No ? ?SOZO SCREENING: ?Patient was assessed today using  the SOZO machine to determine the lymphedema index score. This was compared to her baseline score. It was determined that she is within the recommended range when compared to her baseline and no further action is needed at this time. She will continue SOZO screenings. These are done every 3 months for 2 years post operatively followed by every 6 months for 2 years, and then annually. ? ? ? ?Otelia Limes, PTA ?07/13/2021, 4:19 PM ? ?  ? ?

## 2021-07-17 ENCOUNTER — Other Ambulatory Visit: Payer: Self-pay

## 2021-07-17 ENCOUNTER — Inpatient Hospital Stay: Payer: 59 | Attending: Hematology and Oncology

## 2021-07-17 ENCOUNTER — Inpatient Hospital Stay: Payer: 59

## 2021-07-17 VITALS — BP 124/81 | HR 81 | Temp 98.4°F | Resp 16 | Wt 163.2 lb

## 2021-07-17 DIAGNOSIS — Z170 Estrogen receptor positive status [ER+]: Secondary | ICD-10-CM

## 2021-07-17 DIAGNOSIS — Z5112 Encounter for antineoplastic immunotherapy: Secondary | ICD-10-CM | POA: Insufficient documentation

## 2021-07-17 DIAGNOSIS — Z95828 Presence of other vascular implants and grafts: Secondary | ICD-10-CM

## 2021-07-17 DIAGNOSIS — C50211 Malignant neoplasm of upper-inner quadrant of right female breast: Secondary | ICD-10-CM | POA: Diagnosis present

## 2021-07-17 LAB — CMP (CANCER CENTER ONLY)
ALT: 23 U/L (ref 0–44)
AST: 28 U/L (ref 15–41)
Albumin: 4.1 g/dL (ref 3.5–5.0)
Alkaline Phosphatase: 48 U/L (ref 38–126)
Anion gap: 6 (ref 5–15)
BUN: 10 mg/dL (ref 6–20)
CO2: 28 mmol/L (ref 22–32)
Calcium: 8.1 mg/dL — ABNORMAL LOW (ref 8.9–10.3)
Chloride: 104 mmol/L (ref 98–111)
Creatinine: 0.72 mg/dL (ref 0.44–1.00)
GFR, Estimated: >60 mL/min (ref >60)
Glucose, Bld: 124 mg/dL — ABNORMAL HIGH (ref 70–99)
Potassium: 3.6 mmol/L (ref 3.5–5.1)
Sodium: 138 mmol/L (ref 135–145)
Total Bilirubin: 0.4 mg/dL (ref 0.3–1.2)
Total Protein: 7.1 g/dL (ref 6.5–8.1)

## 2021-07-17 LAB — CBC WITH DIFFERENTIAL (CANCER CENTER ONLY)
Abs Immature Granulocytes: 0.02 10*3/uL (ref 0.00–0.07)
Basophils Absolute: 0.1 10*3/uL (ref 0.0–0.1)
Basophils Relative: 1 %
Eosinophils Absolute: 0.2 10*3/uL (ref 0.0–0.5)
Eosinophils Relative: 2 %
HCT: 42.7 % (ref 36.0–46.0)
Hemoglobin: 14.8 g/dL (ref 12.0–15.0)
Immature Granulocytes: 0 %
Lymphocytes Relative: 22 %
Lymphs Abs: 1.8 10*3/uL (ref 0.7–4.0)
MCH: 34.2 pg — ABNORMAL HIGH (ref 26.0–34.0)
MCHC: 34.7 g/dL (ref 30.0–36.0)
MCV: 98.6 fL (ref 80.0–100.0)
Monocytes Absolute: 0.7 10*3/uL (ref 0.1–1.0)
Monocytes Relative: 8 %
Neutro Abs: 5.3 10*3/uL (ref 1.7–7.7)
Neutrophils Relative %: 67 %
Platelet Count: 265 10*3/uL (ref 150–400)
RBC: 4.33 MIL/uL (ref 3.87–5.11)
RDW: 13.4 % (ref 11.5–15.5)
WBC Count: 8 10*3/uL (ref 4.0–10.5)
nRBC: 0 % (ref 0.0–0.2)

## 2021-07-17 MED ORDER — SODIUM CHLORIDE 0.9% FLUSH
10.0000 mL | Freq: Once | INTRAVENOUS | Status: AC
  Administered 2021-07-17: 10 mL

## 2021-07-17 MED ORDER — ACETAMINOPHEN 325 MG PO TABS
650.0000 mg | ORAL_TABLET | Freq: Once | ORAL | Status: AC
  Administered 2021-07-17: 650 mg via ORAL
  Filled 2021-07-17: qty 2

## 2021-07-17 MED ORDER — SODIUM CHLORIDE 0.9% FLUSH
10.0000 mL | INTRAVENOUS | Status: DC | PRN
  Administered 2021-07-17: 10 mL

## 2021-07-17 MED ORDER — HEPARIN SOD (PORK) LOCK FLUSH 100 UNIT/ML IV SOLN
500.0000 [IU] | Freq: Once | INTRAVENOUS | Status: AC | PRN
  Administered 2021-07-17: 500 [IU]

## 2021-07-17 MED ORDER — SODIUM CHLORIDE 0.9 % IV SOLN
3.4000 mg/kg | Freq: Once | INTRAVENOUS | Status: AC
  Administered 2021-07-17: 260 mg via INTRAVENOUS
  Filled 2021-07-17: qty 8

## 2021-07-17 MED ORDER — DIPHENHYDRAMINE HCL 25 MG PO CAPS
25.0000 mg | ORAL_CAPSULE | Freq: Once | ORAL | Status: AC
  Administered 2021-07-17: 25 mg via ORAL
  Filled 2021-07-17: qty 1

## 2021-07-17 MED ORDER — SODIUM CHLORIDE 0.9 % IV SOLN: Freq: Once | INTRAVENOUS | Status: AC

## 2021-07-17 NOTE — Patient Instructions (Signed)
[facility]  Discharge Instructions: °Thank you for choosing [facility] to provide your oncology and hematology care.  ° °If you have a lab appointment with the Cancer Center, please go directly to the Cancer Center and check in at the registration area. °  °Wear comfortable clothing and clothing appropriate for easy access to any Portacath or PICC line.  ° °We strive to give you quality time with your provider. You may need to reschedule your appointment if you arrive late (15 or more minutes).  Arriving late affects you and other patients whose appointments are after yours.  Also, if you miss three or more appointments without notifying the office, you may be dismissed from the clinic at the provider’s discretion.    °  °For prescription refill requests, have your pharmacy contact our office and allow 72 hours for refills to be completed.   ° °Today you received the following chemotherapy and/or immunotherapy agents Kadcyla    °  °To help prevent nausea and vomiting after your treatment, we encourage you to take your nausea medication as directed. ° °BELOW ARE SYMPTOMS THAT SHOULD BE REPORTED IMMEDIATELY: °*FEVER GREATER THAN 100.4 F (38 °C) OR HIGHER °*CHILLS OR SWEATING °*NAUSEA AND VOMITING THAT IS NOT CONTROLLED WITH YOUR NAUSEA MEDICATION °*UNUSUAL SHORTNESS OF BREATH °*UNUSUAL BRUISING OR BLEEDING °*URINARY PROBLEMS (pain or burning when urinating, or frequent urination) °*BOWEL PROBLEMS (unusual diarrhea, constipation, pain near the anus) °TENDERNESS IN MOUTH AND THROAT WITH OR WITHOUT PRESENCE OF ULCERS (sore throat, sores in mouth, or a toothache) °UNUSUAL RASH, SWELLING OR PAIN  °UNUSUAL VAGINAL DISCHARGE OR ITCHING  ° °Items with * indicate a potential emergency and should be followed up as soon as possible or go to the Emergency Department if any problems should occur. ° °Please show the CHEMOTHERAPY ALERT CARD or IMMUNOTHERAPY ALERT CARD at check-in to the  Emergency Department and triage nurse. ° °Should you have questions after your visit or need to cancel or reschedule your appointment, please contact [facility]  Dept: [phone]  and follow the prompts.  Office hours are 8:00 a.m. to 4:30 p.m. Monday - Friday. Please note that voicemails left after 4:00 p.m. may not be returned until the following business day.  We are closed weekends and major holidays. You have access to a nurse at all times for urgent questions. Please call the main number to the clinic Dept: [phone] and follow the prompts. ° ° °For any non-urgent questions, you may also contact your provider using MyChart. We now offer e-Visits for anyone 18 and older to request care online for non-urgent symptoms. For details visit [email or web address]. °  °Also download the MyChart app! Go to the app store, search "MyChart", open the app, select [facility], and log in with your MyChart username and password. ° °Due to Covid, a mask is required upon entering the hospital/clinic. If you do not have a mask, one will be given to you upon arrival. For doctor visits, patients may have 1 support person aged 18 or older with them. For treatment visits, patients cannot have anyone with them due to current Covid guidelines and our immunocompromised population.  ° °

## 2021-07-18 LAB — CALCIUM, IONIZED: Calcium, Ionized, Serum: 4.3 mg/dL — ABNORMAL LOW (ref 4.5–5.6)

## 2021-08-05 ENCOUNTER — Other Ambulatory Visit: Payer: Self-pay | Admitting: *Deleted

## 2021-08-05 DIAGNOSIS — C50211 Malignant neoplasm of upper-inner quadrant of right female breast: Secondary | ICD-10-CM

## 2021-08-07 ENCOUNTER — Inpatient Hospital Stay: Payer: 59

## 2021-08-07 ENCOUNTER — Encounter: Payer: Self-pay | Admitting: Adult Health

## 2021-08-07 ENCOUNTER — Inpatient Hospital Stay: Payer: 59 | Attending: Hematology and Oncology | Admitting: Adult Health

## 2021-08-07 ENCOUNTER — Other Ambulatory Visit: Payer: Self-pay

## 2021-08-07 VITALS — BP 116/86 | HR 95 | Temp 98.1°F | Resp 18 | Ht 66.0 in | Wt 162.8 lb

## 2021-08-07 DIAGNOSIS — Z803 Family history of malignant neoplasm of breast: Secondary | ICD-10-CM | POA: Diagnosis not present

## 2021-08-07 DIAGNOSIS — Z1502 Genetic susceptibility to malignant neoplasm of ovary: Secondary | ICD-10-CM | POA: Diagnosis not present

## 2021-08-07 DIAGNOSIS — Z170 Estrogen receptor positive status [ER+]: Secondary | ICD-10-CM | POA: Diagnosis not present

## 2021-08-07 DIAGNOSIS — Z5112 Encounter for antineoplastic immunotherapy: Secondary | ICD-10-CM | POA: Diagnosis present

## 2021-08-07 DIAGNOSIS — F17210 Nicotine dependence, cigarettes, uncomplicated: Secondary | ICD-10-CM | POA: Diagnosis not present

## 2021-08-07 DIAGNOSIS — C7951 Secondary malignant neoplasm of bone: Secondary | ICD-10-CM | POA: Insufficient documentation

## 2021-08-07 DIAGNOSIS — Z95828 Presence of other vascular implants and grafts: Secondary | ICD-10-CM

## 2021-08-07 DIAGNOSIS — C50211 Malignant neoplasm of upper-inner quadrant of right female breast: Secondary | ICD-10-CM

## 2021-08-07 DIAGNOSIS — Z8042 Family history of malignant neoplasm of prostate: Secondary | ICD-10-CM | POA: Diagnosis not present

## 2021-08-07 DIAGNOSIS — E3122 Multiple endocrine neoplasia [MEN] type IIA: Secondary | ICD-10-CM | POA: Insufficient documentation

## 2021-08-07 DIAGNOSIS — Z1509 Genetic susceptibility to other malignant neoplasm: Secondary | ICD-10-CM | POA: Diagnosis not present

## 2021-08-07 DIAGNOSIS — Z79818 Long term (current) use of other agents affecting estrogen receptors and estrogen levels: Secondary | ICD-10-CM | POA: Insufficient documentation

## 2021-08-07 DIAGNOSIS — Z1501 Genetic susceptibility to malignant neoplasm of breast: Secondary | ICD-10-CM | POA: Diagnosis not present

## 2021-08-07 DIAGNOSIS — Z9013 Acquired absence of bilateral breasts and nipples: Secondary | ICD-10-CM | POA: Diagnosis not present

## 2021-08-07 DIAGNOSIS — Z79811 Long term (current) use of aromatase inhibitors: Secondary | ICD-10-CM | POA: Insufficient documentation

## 2021-08-07 LAB — CMP (CANCER CENTER ONLY)
ALT: 43 U/L (ref 0–44)
AST: 50 U/L — ABNORMAL HIGH (ref 15–41)
Albumin: 4.5 g/dL (ref 3.5–5.0)
Alkaline Phosphatase: 64 U/L (ref 38–126)
Anion gap: 7 (ref 5–15)
BUN: 9 mg/dL (ref 6–20)
CO2: 27 mmol/L (ref 22–32)
Calcium: 9.1 mg/dL (ref 8.9–10.3)
Chloride: 104 mmol/L (ref 98–111)
Creatinine: 0.72 mg/dL (ref 0.44–1.00)
GFR, Estimated: >60 mL/min (ref >60)
Glucose, Bld: 109 mg/dL — ABNORMAL HIGH (ref 70–99)
Potassium: 3.7 mmol/L (ref 3.5–5.1)
Sodium: 138 mmol/L (ref 135–145)
Total Bilirubin: 0.7 mg/dL (ref 0.3–1.2)
Total Protein: 7.4 g/dL (ref 6.5–8.1)

## 2021-08-07 LAB — CBC WITH DIFFERENTIAL (CANCER CENTER ONLY)
Abs Immature Granulocytes: 0.03 10*3/uL (ref 0.00–0.07)
Basophils Absolute: 0.1 10*3/uL (ref 0.0–0.1)
Basophils Relative: 1 %
Eosinophils Absolute: 0.2 10*3/uL (ref 0.0–0.5)
Eosinophils Relative: 2 %
HCT: 43.4 % (ref 36.0–46.0)
Hemoglobin: 15.1 g/dL — ABNORMAL HIGH (ref 12.0–15.0)
Immature Granulocytes: 0 %
Lymphocytes Relative: 22 %
Lymphs Abs: 1.8 10*3/uL (ref 0.7–4.0)
MCH: 34.2 pg — ABNORMAL HIGH (ref 26.0–34.0)
MCHC: 34.8 g/dL (ref 30.0–36.0)
MCV: 98.2 fL (ref 80.0–100.0)
Monocytes Absolute: 0.7 10*3/uL (ref 0.1–1.0)
Monocytes Relative: 9 %
Neutro Abs: 5.1 10*3/uL (ref 1.7–7.7)
Neutrophils Relative %: 66 %
Platelet Count: 256 10*3/uL (ref 150–400)
RBC: 4.42 MIL/uL (ref 3.87–5.11)
RDW: 13.8 % (ref 11.5–15.5)
WBC Count: 7.8 10*3/uL (ref 4.0–10.5)
nRBC: 0 % (ref 0.0–0.2)

## 2021-08-07 MED ORDER — SODIUM CHLORIDE 0.9 % IV SOLN
3.4000 mg/kg | Freq: Once | INTRAVENOUS | Status: AC
  Administered 2021-08-07: 260 mg via INTRAVENOUS
  Filled 2021-08-07: qty 8

## 2021-08-07 MED ORDER — SODIUM CHLORIDE 0.9% FLUSH
10.0000 mL | Freq: Once | INTRAVENOUS | Status: AC
  Administered 2021-08-07: 10 mL

## 2021-08-07 MED ORDER — HEPARIN SOD (PORK) LOCK FLUSH 100 UNIT/ML IV SOLN
500.0000 [IU] | Freq: Once | INTRAVENOUS | Status: AC | PRN
  Administered 2021-08-07: 500 [IU]

## 2021-08-07 MED ORDER — GOSERELIN ACETATE 3.6 MG ~~LOC~~ IMPL
3.6000 mg | DRUG_IMPLANT | Freq: Once | SUBCUTANEOUS | Status: AC
  Administered 2021-08-07: 3.6 mg via SUBCUTANEOUS
  Filled 2021-08-07: qty 3.6

## 2021-08-07 MED ORDER — SODIUM CHLORIDE 0.9 % IV SOLN: Freq: Once | INTRAVENOUS | Status: AC

## 2021-08-07 MED ORDER — SODIUM CHLORIDE 0.9% FLUSH
10.0000 mL | INTRAVENOUS | Status: DC | PRN
  Administered 2021-08-07: 10 mL

## 2021-08-07 MED ORDER — DIPHENHYDRAMINE HCL 25 MG PO CAPS
25.0000 mg | ORAL_CAPSULE | Freq: Once | ORAL | Status: AC
  Administered 2021-08-07: 25 mg via ORAL
  Filled 2021-08-07: qty 1

## 2021-08-07 MED ORDER — ACETAMINOPHEN 325 MG PO TABS
650.0000 mg | ORAL_TABLET | Freq: Once | ORAL | Status: AC
  Administered 2021-08-07: 650 mg via ORAL
  Filled 2021-08-07: qty 2

## 2021-08-07 NOTE — Patient Instructions (Signed)
[facility]  Discharge Instructions: °Thank you for choosing [facility] to provide your oncology and hematology care.  ° °If you have a lab appointment with the Cancer Center, please go directly to the Cancer Center and check in at the registration area. °  °Wear comfortable clothing and clothing appropriate for easy access to any Portacath or PICC line.  ° °We strive to give you quality time with your provider. You may need to reschedule your appointment if you arrive late (15 or more minutes).  Arriving late affects you and other patients whose appointments are after yours.  Also, if you miss three or more appointments without notifying the office, you may be dismissed from the clinic at the provider’s discretion.    °  °For prescription refill requests, have your pharmacy contact our office and allow 72 hours for refills to be completed.   ° °Today you received the following chemotherapy and/or immunotherapy agents Kadcyla    °  °To help prevent nausea and vomiting after your treatment, we encourage you to take your nausea medication as directed. ° °BELOW ARE SYMPTOMS THAT SHOULD BE REPORTED IMMEDIATELY: °*FEVER GREATER THAN 100.4 F (38 °C) OR HIGHER °*CHILLS OR SWEATING °*NAUSEA AND VOMITING THAT IS NOT CONTROLLED WITH YOUR NAUSEA MEDICATION °*UNUSUAL SHORTNESS OF BREATH °*UNUSUAL BRUISING OR BLEEDING °*URINARY PROBLEMS (pain or burning when urinating, or frequent urination) °*BOWEL PROBLEMS (unusual diarrhea, constipation, pain near the anus) °TENDERNESS IN MOUTH AND THROAT WITH OR WITHOUT PRESENCE OF ULCERS (sore throat, sores in mouth, or a toothache) °UNUSUAL RASH, SWELLING OR PAIN  °UNUSUAL VAGINAL DISCHARGE OR ITCHING  ° °Items with * indicate a potential emergency and should be followed up as soon as possible or go to the Emergency Department if any problems should occur. ° °Please show the CHEMOTHERAPY ALERT CARD or IMMUNOTHERAPY ALERT CARD at check-in to the  Emergency Department and triage nurse. ° °Should you have questions after your visit or need to cancel or reschedule your appointment, please contact [facility]  Dept: [phone]  and follow the prompts.  Office hours are 8:00 a.m. to 4:30 p.m. Monday - Friday. Please note that voicemails left after 4:00 p.m. may not be returned until the following business day.  We are closed weekends and major holidays. You have access to a nurse at all times for urgent questions. Please call the main number to the clinic Dept: [phone] and follow the prompts. ° ° °For any non-urgent questions, you may also contact your provider using MyChart. We now offer e-Visits for anyone 18 and older to request care online for non-urgent symptoms. For details visit [email or web address]. °  °Also download the MyChart app! Go to the app store, search "MyChart", open the app, select , and log in with your MyChart username and password. ° °Due to Covid, a mask is required upon entering the hospital/clinic. If you do not have a mask, one will be given to you upon arrival. For doctor visits, patients may have 1 support person aged 18 or older with them. For treatment visits, patients cannot have anyone with them due to current Covid guidelines and our immunocompromised population.  ° °

## 2021-08-07 NOTE — Progress Notes (Signed)
[facility]:    [place], [place] @ [place] [address]   DIAGNOSIS:  Cancer Staging  Malignant neoplasm of upper-inner quadrant of right breast in female, estrogen receptor positive ([place]) Staging form: Breast, AJCC 8th Edition - Clinical stage from 07/08/2020: Stage IIIA (cT3, cN3, cM0, G1, ER+, PR+, HER2+) - Unsigned Stage prefix: Initial diagnosis Histologic grading system: 3 grade system - Pathologic stage from 01/07/2021: No Stage Recommended (ypT2, pN85m, cM0, G2) - Signed by CGardenia Phlegm NP on 01/15/2021 Stage prefix: Post-therapy Histologic grading system: 3 grade system   SUMMARY OF ONCOLOGIC HISTORY: Oncology History  Malignant neoplasm of upper-inner quadrant of right breast in female, estrogen receptor positive ([place]  07/08/2020 Initial Diagnosis   a clinical mT2 N3, stage IIIA invasive ductal carcinoma, grade 1, triple positive  (a) breast MRI 07/06/2020 finds a 1.6 cm lower inner quadrant mass associated with 5.4 cm of non-mass-like enhancement, at least 8 enlarged right axillary lymph node and 1 right internal mammary lymph node  (b) CT scans of the neck and chest 06/24/2020 shows subcentimeter right cervical adenopathy, 2 right supraclavicular lymph nodes the larger 0.65 cm, also left cervical adenopathy largest measuring 1.02 cm; there is also a 1.2 cm lesion in the left hepatic lobe, and a sclerotic lesion in the mid thoracic spine at T7  (c) PET scan 07/21/2020 shows regional adenopathy (including 2 right subcentimeter supraclavicular lymph nodes and subcentimeter lymph nodes in the right hilar, subcarinal and lateral aortic region), no uptake in liver, and an indeterminate T12 lesion  (d) thoracic spine MRI 07/27/2020 shows the T12 lesion to be likely benign, there is also degenerative disc disease   07/23/2020 - 12/18/2020 Neo-Adjuvant Chemotherapy   neoadjuvant chemotherapy will consist  of carboplatin, docetaxel, trastuzumab, and pertuzumab q. 21 days x 6 07/23/2020-11/06/2020 --two doses of Trastuzumab given on 9/22 and 10/13   01/07/2021 Cancer Staging   Staging form: Breast, AJCC 8th Edition - Pathologic stage from 01/07/2021: No Stage Recommended (ypT2, pN154m cM0, G2) - Signed by CaGardenia PhlegmNP on 01/15/2021 Stage prefix: Post-therapy Histologic grading system: 3 grade system    01/07/2021 Surgery   She underwent bilateral mastectomy on 01/07/2021 that showed: left breast no evidence of malignancy, and the right breast invasive ductal carcinoma grade 2, spanning fibrotic area of approximately 5 cm in patches.  Margins were negative.   (A) Microscopic metastatic carcinoma was noted in 1 out of 15 lymph nodes biopsied.   01/26/2021 -  Chemotherapy   Patient is on Treatment Plan : BREAST ADO-Trastuzumab Emtansine (Kadcyla) q21d      02/13/2021 - 04/01/2021 Radiation Therapy   Adjuvant radiation   04/08/2021 -  Anti-estrogen oral therapy   Antiestrogen therapy with Zolodex and AI starting 04/21/21     CURRENT THERAPY: Kadcyla, Zoladex, anastrozole  INTERVAL HISTORY: Kayla MARTENSON87.o. female returns for follow-up and evaluation prior to receiving Kadcyla therapy.  She continues to do well.  Her most recent echocardiogram was completed on June 05, 2021 and this showed normal left ventricular ejection fraction of 60 to 65%.  Her global longitudinal strain was normal.  She is also receiving Zoladex every [redacted] weeks along with anastrozole taken daily.  She is tolerating these quite well.  She does experience some generalized bone aches however otherwise denies any significant concerns.  Kayla Little changed her schedule around a little bit because her brother is having a wedding reception  and she plans on attending this.   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   Prolonged QT interval 07/30/2020   Bone lesion 07/23/2020   MEN 2A (multiple endocrine neoplasia, type 2A) ([place]) 04/12/2019   Multiple thyroid nodules 04/08/2019   Family history of breast cancer    Family history of prostate cancer    Family history of lymphoma    Family history of ovarian cancer    Monoallelic mutation of CHEK2 gene in female patient    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)    Hx of varicella    Kidney stone 2012   Medullary thyroid carcinoma ([place])    Monoallelic mutation of CHEK2 gene in female patient    Postpartum care following vaginal delivery (7/27) 10/02/2014, June 16 , 2020   Second degree perineal laceration 08/23/2018   SVD (spontaneous vaginal delivery) 08/23/2018   Thyroid cancer ([place]) 2021    SURGICAL HISTORY: Past Surgical History:  Procedure Laterality Date   IR IMAGING GUIDED PORT INSERTION  07/22/2020   MODIFIED MASTECTOMY Right 01/07/2021   Procedure: RIGHT MODIFIED RADICAL MASTECTOMY;  Surgeon: Jovita Kussmaul, MD;  Location: [facility];  Service: General;  Laterality: Right;   NO PAST SURGERIES     SIMPLE MASTECTOMY WITH AXILLARY SENTINEL NODE BIOPSY Left 01/07/2021    Procedure: LEFT SIMPLE MASTECTOMY;  Surgeon: Jovita Kussmaul, MD;  Location: [facility];  Service: General;  Laterality: Left;   THYROIDECTOMY N/A 04/12/2019   Procedure: TOTAL THYROIDECTOMY WITH LIMITED LYMPH NODE DISSECTION;  Surgeon: Armandina Gemma, MD;  Location: [facility];  Service: General;  Laterality: N/A;   UNILATERAL SALPINGECTOMY Right 07/09/2017   Procedure: RIGHT SALPINGECTOMY WITH REMOVAL OF ECTOPIC PREGNANCY;  Surgeon: Azucena Fallen, MD;  Location: [facility];  Service: Gynecology;  Laterality: Right;    SOCIAL HISTORY: Social History   Socioeconomic History   Marital status: Married    Spouse name: Not on file   Number of children: Not on file   Years of education: Not on file   Highest education level: Not on file  Occupational History   Occupation: homemaker  Tobacco Use   Smoking status: Some Days    Packs/day: 0.50    Years: 5.00    Pack years: 2.50    Types: Cigarettes   Smokeless tobacco: Never   Tobacco comments:    Actively trying to quit.  Vaping Use   Vaping Use: Never used  Substance and Sexual Activity   Alcohol use: Yes    Alcohol/week: 7.0 - 14.0 standard drinks  Types: 7 - 14 Glasses of wine per week    Comment: glass of wine each night    Drug use: Not Currently    Types: Marijuana    Comment: not during pregnancy, last use was 03/27/2019   Sexual activity: Yes    Birth control/protection: None, I.U.D.  Other Topics Concern   Not on file  Social History Narrative   Not on file   Social Determinants of Health   Financial Resource Strain: Not on file  Food Insecurity: Not on file  Transportation Needs: Not on file  Physical Activity: Not on file  Stress: Not on file  Social Connections: Not on file  Intimate Partner Violence: Not on file    FAMILY HISTORY: Family History  Problem Relation Age of Onset   Breast cancer Mother 26       2 types of cancer one side mastectomy with chemo   Endometriosis Mother    Endometriosis  Maternal Grandmother    Breast cancer Maternal Grandmother        post menopausal   Diabetes Maternal Grandmother    Cancer Maternal Grandfather        prostate and lymphoma   Alzheimer's disease Paternal Grandmother    Cancer Father 37       prostate cancer   Aneurysm Paternal Grandfather        d. 48s    Review of Systems  Constitutional:  Negative for appetite change, chills, fatigue, fever and unexpected weight change.  HENT:   Negative for hearing loss, lump/mass and trouble swallowing.   Eyes:  Negative for eye problems and icterus.  Respiratory:  Negative for chest tightness, cough and shortness of breath.   Cardiovascular:  Negative for chest pain, leg swelling and palpitations.  Gastrointestinal:  Negative for abdominal distention, abdominal pain, constipation, diarrhea, nausea and vomiting.  Endocrine: Negative for hot flashes.  Genitourinary:  Negative for difficulty urinating.   Musculoskeletal:  Positive for arthralgias (Per interval history).  Skin:  Negative for itching and rash.  Neurological:  Negative for dizziness, extremity weakness, headaches and numbness.  Hematological:  Negative for adenopathy. Does not bruise/bleed easily.  Psychiatric/Behavioral:  Negative for depression. The patient is not nervous/anxious.      PHYSICAL EXAMINATION  ECOG PERFORMANCE STATUS: 1 - Symptomatic but completely ambulatory  Vitals:   08/07/21 0826  BP: 116/86  Pulse: 95  Resp: 18  Temp: 98.1 F (36.7 C)  SpO2: 98%    Physical Exam Constitutional:      General: She is not in acute distress.    Appearance: Normal appearance. She is not toxic-appearing.  HENT:     Head: Normocephalic and atraumatic.  Eyes:     General: No scleral icterus. Cardiovascular:     Rate and Rhythm: Normal rate and regular rhythm.     Pulses: Normal pulses.     Heart sounds: Normal heart sounds.  Pulmonary:     Effort: Pulmonary effort is normal.     Breath sounds: Normal breath  sounds.  Abdominal:     General: Abdomen is flat. Bowel sounds are normal. There is no distension.     Palpations: Abdomen is soft.     Tenderness: There is no abdominal tenderness.  Musculoskeletal:        General: No swelling.     Cervical back: Neck supple.  Lymphadenopathy:     Cervical: No cervical adenopathy.  Skin:    General: Skin is warm and dry.  Findings: No rash.  Neurological:     General: No focal deficit present.     Mental Status: She is alert.  Psychiatric:        Mood and Affect: Mood normal.        Behavior: Behavior normal.    LABORATORY DATA:  CBC    Component Value Date/Time   WBC 7.8 08/07/2021 0820   WBC 7.5 05/15/2021 0941   RBC 4.42 08/07/2021 0820   HGB 15.1 (H) 08/07/2021 0820   HGB 12.2 02/16/2017 1332   HCT 43.4 08/07/2021 0820   HCT 37.0 02/16/2017 1332   PLT 256 08/07/2021 0820   PLT 315 02/16/2017 1332   MCV 98.2 08/07/2021 0820   MCV 98 (H) 02/16/2017 1332   MCH 34.2 (H) 08/07/2021 0820   MCHC 34.8 08/07/2021 0820   RDW 13.8 08/07/2021 0820   RDW 12.8 02/16/2017 1332   LYMPHSABS 1.8 08/07/2021 0820   LYMPHSABS 2.2 02/16/2017 1332   MONOABS 0.7 08/07/2021 0820   EOSABS 0.2 08/07/2021 0820   EOSABS 0.1 02/16/2017 1332   BASOSABS 0.1 08/07/2021 0820   BASOSABS 0.0 02/16/2017 1332    CMP     Component Value Date/Time   NA 138 07/17/2021 1034   NA 142 02/16/2017 1332   K 3.6 07/17/2021 1034   CL 104 07/17/2021 1034   CO2 28 07/17/2021 1034   GLUCOSE 124 (H) 07/17/2021 1034   BUN 10 07/17/2021 1034   BUN 9 02/16/2017 1332   CREATININE 0.72 07/17/2021 1034   CREATININE 0.82 12/04/2012 1732   CALCIUM 8.1 (L) 07/17/2021 1034   PROT 7.1 07/17/2021 1034   PROT 6.5 02/16/2017 1332   ALBUMIN 4.1 07/17/2021 1034   ALBUMIN 4.3 02/16/2017 1332   AST 28 07/17/2021 1034   ALT 23 07/17/2021 1034   ALKPHOS 48 07/17/2021 1034   BILITOT 0.4 07/17/2021 1034   GFRNONAA >60 07/17/2021 1034   GFRAA >60 04/15/2019 2145      ASSESSMENT and THERAPY PLAN:   Malignant neoplasm of upper-inner quadrant of right breast in female, estrogen receptor positive (HCC) stage IIIa triple positive breast cancer.   Treatment history: 1.  Initial diagnosis with T2N3 triple positive breast cancer PET scan showed regional adenopathy with no distant metastasis. 2.  Neoadjuvant chemotherapy with TCHP x6 from 07/23/2020 through 11/06/2020 3.  2 doses of Herceptin on 11/27/2020 and 12/18/2020 4.  Bilateral mastectomies on January 07, 2021 which showed T2 N1 MI residual cancer. 5.  Patient began adjuvant Kadcyla x14 cycles beginning January 26, 2021 through August 25 6.  Adjuvant radiation completed January 25 7.  Followed by adjuvant antiestrogen therapy with Zoladex and anastrozole _______________________________________________________________________________________________ Current treatment: Kadcyla maintenance; Zoladex; anastrozole Kadcyla toxicities: Echocardiogram completed 06/05/2021 EF 60-65%  Joellen Jersey is doing quite well today.  She continues on Kadcyla with good tolerance.  Her labs thus far are stable.  She will proceed with treatment today so long as her CMP is within normal parameters.  She also continues on Zoladex and anastrozole which she will continue.  She is tolerating this well.  We reviewed her overall plan and she will return every 3 weeks for labs and treatment and we will see Dr. Lindi Adie again in 6 weeks.   All questions were answered. The patient knows to call the clinic with any problems, questions or concerns. We can certainly see the patient much sooner if necessary.  Total encounter time:20 minutes*in face-to-face visit time, chart review, lab review, care coordination, order entry, and  documentation of the encounter time.  Wilber Bihari, NP 08/07/21 8:54 AM Medical Oncology and Hematology [facility] [place], [place] [address] Tel. [phone]    Fax.  [phone]  *Total Encounter Time as defined by the Centers for Medicare and Medicaid Services includes, in addition to the face-to-face time of a patient visit (documented in the note above) non-face-to-face time: obtaining and reviewing outside history, ordering and reviewing medications, tests or procedures, care coordination (communications with other health care professionals or caregivers) and documentation in the medical record.

## 2021-08-07 NOTE — Assessment & Plan Note (Signed)
stage IIIa triple positive breast cancer.  Treatment history: 1.  Initial diagnosis with T2N3 triple positive breast cancer PET scan showed regional adenopathy with no distant metastasis. 2.  Neoadjuvant chemotherapy with TCHP x6 from 07/23/2020 through 11/06/2020 3.  2 doses of Herceptin on 11/27/2020 and 12/18/2020 4.  Bilateral mastectomies on January 07, 2021 which showed T2 N1 MI residual cancer. 5.  Patient began adjuvant Kadcyla x14 cycles beginning January 26, 2021 through August 25 6.  Adjuvant radiation completed January 25 7.  Followed by adjuvant antiestrogen therapy with Zoladex and anastrozole _______________________________________________________________________________________________ Current treatment: Kadcyla maintenance; Zoladex; anastrozole Kadcyla toxicities: Echocardiogram completed 06/05/2021 EF 60-65%  Kayla Little is doing quite well today.  She continues on Kadcyla with good tolerance.  Her labs thus far are stable.  She will proceed with treatment today so long as her CMP is within normal parameters.  She also continues on Zoladex and anastrozole which she will continue.  She is tolerating this well.  We reviewed her overall plan and she will return every 3 weeks for labs and treatment and we will see Dr. Lindi Adie again in 6 weeks.

## 2021-08-08 LAB — CALCIUM, IONIZED: Calcium, Ionized, Serum: 4.7 mg/dL (ref 4.5–5.6)

## 2021-08-26 ENCOUNTER — Other Ambulatory Visit: Payer: Self-pay | Admitting: *Deleted

## 2021-08-26 DIAGNOSIS — Z170 Estrogen receptor positive status [ER+]: Secondary | ICD-10-CM

## 2021-08-28 ENCOUNTER — Ambulatory Visit (HOSPITAL_COMMUNITY)
Admission: RE | Admit: 2021-08-28 | Discharge: 2021-08-28 | Disposition: A | Discharge status: Home / Self Care | Payer: 59 | Source: Ambulatory Visit | Attending: Adult Health | Admitting: Adult Health

## 2021-08-28 ENCOUNTER — Inpatient Hospital Stay: Payer: 59

## 2021-08-28 ENCOUNTER — Other Ambulatory Visit: Payer: Self-pay

## 2021-08-28 VITALS — BP 128/90 | HR 88 | Temp 98.6°F | Resp 17 | Ht 66.0 in | Wt 162.2 lb

## 2021-08-28 DIAGNOSIS — Z170 Estrogen receptor positive status [ER+]: Secondary | ICD-10-CM | POA: Diagnosis not present

## 2021-08-28 DIAGNOSIS — Z5111 Encounter for antineoplastic chemotherapy: Secondary | ICD-10-CM | POA: Diagnosis not present

## 2021-08-28 DIAGNOSIS — Z5112 Encounter for antineoplastic immunotherapy: Secondary | ICD-10-CM | POA: Diagnosis not present

## 2021-08-28 DIAGNOSIS — Z0189 Encounter for other specified special examinations: Secondary | ICD-10-CM | POA: Diagnosis not present

## 2021-08-28 DIAGNOSIS — Z95828 Presence of other vascular implants and grafts: Secondary | ICD-10-CM

## 2021-08-28 DIAGNOSIS — C50211 Malignant neoplasm of upper-inner quadrant of right female breast: Secondary | ICD-10-CM | POA: Insufficient documentation

## 2021-08-28 LAB — CBC WITH DIFFERENTIAL (CANCER CENTER ONLY)
Abs Immature Granulocytes: 0.04 10*3/uL (ref 0.00–0.07)
Basophils Absolute: 0.1 10*3/uL (ref 0.0–0.1)
Basophils Relative: 1 %
Eosinophils Absolute: 0.2 10*3/uL (ref 0.0–0.5)
Eosinophils Relative: 3 %
HCT: 44.1 % (ref 36.0–46.0)
Hemoglobin: 15.4 g/dL — ABNORMAL HIGH (ref 12.0–15.0)
Immature Granulocytes: 1 %
Lymphocytes Relative: 20 %
Lymphs Abs: 1.5 10*3/uL (ref 0.7–4.0)
MCH: 34.7 pg — ABNORMAL HIGH (ref 26.0–34.0)
MCHC: 34.9 g/dL (ref 30.0–36.0)
MCV: 99.3 fL (ref 80.0–100.0)
Monocytes Absolute: 0.6 10*3/uL (ref 0.1–1.0)
Monocytes Relative: 8 %
Neutro Abs: 5.1 10*3/uL (ref 1.7–7.7)
Neutrophils Relative %: 67 %
Platelet Count: 236 10*3/uL (ref 150–400)
RBC: 4.44 MIL/uL (ref 3.87–5.11)
RDW: 13.4 % (ref 11.5–15.5)
WBC Count: 7.5 10*3/uL (ref 4.0–10.5)
nRBC: 0 % (ref 0.0–0.2)

## 2021-08-28 LAB — CMP (CANCER CENTER ONLY)
ALT: 36 U/L (ref 0–44)
AST: 45 U/L — ABNORMAL HIGH (ref 15–41)
Albumin: 4.3 g/dL (ref 3.5–5.0)
Alkaline Phosphatase: 62 U/L (ref 38–126)
Anion gap: 6 (ref 5–15)
BUN: 8 mg/dL (ref 6–20)
CO2: 27 mmol/L (ref 22–32)
Calcium: 8.7 mg/dL — ABNORMAL LOW (ref 8.9–10.3)
Chloride: 105 mmol/L (ref 98–111)
Creatinine: 0.81 mg/dL (ref 0.44–1.00)
GFR, Estimated: >60 mL/min (ref >60)
Glucose, Bld: 101 mg/dL — ABNORMAL HIGH (ref 70–99)
Potassium: 3.9 mmol/L (ref 3.5–5.1)
Sodium: 138 mmol/L (ref 135–145)
Total Bilirubin: 0.7 mg/dL (ref 0.3–1.2)
Total Protein: 7.3 g/dL (ref 6.5–8.1)

## 2021-08-28 LAB — ECHOCARDIOGRAM COMPLETE
AR max vel: 2.33 cm2
AV Peak grad: 7 mmHg
Ao pk vel: 1.32 m/s
Area-P 1/2: 3.83 cm2
Calc EF: 62.7 %
S' Lateral: 3.3 cm
Single Plane A2C EF: 62.9 %
Single Plane A4C EF: 62.1 %

## 2021-08-28 MED ORDER — SODIUM CHLORIDE 0.9 % IV SOLN: 3.6000 mg/kg | Freq: Once | INTRAVENOUS | Status: DC

## 2021-08-28 MED ORDER — SODIUM CHLORIDE 0.9% FLUSH
10.0000 mL | Freq: Once | INTRAVENOUS | Status: AC
  Administered 2021-08-28: 10 mL

## 2021-08-28 MED ORDER — SODIUM CHLORIDE 0.9 % IV SOLN: Freq: Once | INTRAVENOUS | Status: AC

## 2021-08-28 MED ORDER — ACETAMINOPHEN 325 MG PO TABS
650.0000 mg | ORAL_TABLET | Freq: Once | ORAL | Status: AC
  Administered 2021-08-28: 650 mg via ORAL
  Filled 2021-08-28: qty 2

## 2021-08-28 MED ORDER — SODIUM CHLORIDE 0.9% FLUSH
10.0000 mL | INTRAVENOUS | Status: DC | PRN
  Administered 2021-08-28: 10 mL

## 2021-08-28 MED ORDER — HEPARIN SOD (PORK) LOCK FLUSH 100 UNIT/ML IV SOLN
500.0000 [IU] | Freq: Once | INTRAVENOUS | Status: AC | PRN
  Administered 2021-08-28: 500 [IU]

## 2021-08-28 MED ORDER — DIPHENHYDRAMINE HCL 25 MG PO CAPS
25.0000 mg | ORAL_CAPSULE | Freq: Once | ORAL | Status: AC
  Administered 2021-08-28: 25 mg via ORAL
  Filled 2021-08-28: qty 1

## 2021-08-28 MED ORDER — SODIUM CHLORIDE 0.9 % IV SOLN
260.0000 mg | Freq: Once | INTRAVENOUS | Status: AC
  Administered 2021-08-28: 260 mg via INTRAVENOUS
  Filled 2021-08-28: qty 5

## 2021-08-29 LAB — CALCIUM, IONIZED: Calcium, Ionized, Serum: 4.5 mg/dL (ref 4.5–5.6)

## 2021-09-11 ENCOUNTER — Inpatient Hospital Stay: Payer: 59 | Attending: Hematology and Oncology

## 2021-09-11 DIAGNOSIS — Z79899 Other long term (current) drug therapy: Secondary | ICD-10-CM | POA: Insufficient documentation

## 2021-09-11 DIAGNOSIS — Z170 Estrogen receptor positive status [ER+]: Secondary | ICD-10-CM | POA: Insufficient documentation

## 2021-09-11 DIAGNOSIS — Z5112 Encounter for antineoplastic immunotherapy: Secondary | ICD-10-CM | POA: Insufficient documentation

## 2021-09-11 DIAGNOSIS — Z79811 Long term (current) use of aromatase inhibitors: Secondary | ICD-10-CM | POA: Insufficient documentation

## 2021-09-11 DIAGNOSIS — Z5111 Encounter for antineoplastic chemotherapy: Secondary | ICD-10-CM | POA: Insufficient documentation

## 2021-09-11 DIAGNOSIS — C50211 Malignant neoplasm of upper-inner quadrant of right female breast: Secondary | ICD-10-CM | POA: Insufficient documentation

## 2021-09-16 ENCOUNTER — Telehealth: Payer: Self-pay | Admitting: Hematology and Oncology

## 2021-09-16 NOTE — Telephone Encounter (Signed)
Rescheduled appointment per patient request to reschedule. Left voicemail with the changes made to the patients upcoming appointments.

## 2021-09-17 ENCOUNTER — Encounter: Payer: Self-pay | Admitting: *Deleted

## 2021-09-17 ENCOUNTER — Inpatient Hospital Stay: Payer: 59

## 2021-09-17 ENCOUNTER — Inpatient Hospital Stay: Payer: 59 | Admitting: Hematology and Oncology

## 2021-09-18 ENCOUNTER — Inpatient Hospital Stay: Payer: 59

## 2021-09-18 ENCOUNTER — Inpatient Hospital Stay: Payer: 59 | Admitting: Hematology and Oncology

## 2021-09-21 ENCOUNTER — Other Ambulatory Visit: Payer: Self-pay

## 2021-09-21 DIAGNOSIS — Z170 Estrogen receptor positive status [ER+]: Secondary | ICD-10-CM

## 2021-09-22 ENCOUNTER — Inpatient Hospital Stay: Payer: 59

## 2021-09-22 ENCOUNTER — Inpatient Hospital Stay (HOSPITAL_BASED_OUTPATIENT_CLINIC_OR_DEPARTMENT_OTHER): Payer: 59 | Admitting: Hematology and Oncology

## 2021-09-22 ENCOUNTER — Other Ambulatory Visit: Payer: Self-pay

## 2021-09-22 ENCOUNTER — Other Ambulatory Visit: Payer: Self-pay | Admitting: *Deleted

## 2021-09-22 VITALS — BP 117/81 | HR 92 | Temp 98.0°F | Resp 18

## 2021-09-22 DIAGNOSIS — Z170 Estrogen receptor positive status [ER+]: Secondary | ICD-10-CM

## 2021-09-22 DIAGNOSIS — Z95828 Presence of other vascular implants and grafts: Secondary | ICD-10-CM

## 2021-09-22 DIAGNOSIS — Z5111 Encounter for antineoplastic chemotherapy: Secondary | ICD-10-CM | POA: Diagnosis present

## 2021-09-22 DIAGNOSIS — Z79811 Long term (current) use of aromatase inhibitors: Secondary | ICD-10-CM | POA: Diagnosis not present

## 2021-09-22 DIAGNOSIS — Z5112 Encounter for antineoplastic immunotherapy: Secondary | ICD-10-CM | POA: Diagnosis not present

## 2021-09-22 DIAGNOSIS — C50211 Malignant neoplasm of upper-inner quadrant of right female breast: Secondary | ICD-10-CM

## 2021-09-22 DIAGNOSIS — Z79899 Other long term (current) drug therapy: Secondary | ICD-10-CM | POA: Diagnosis not present

## 2021-09-22 LAB — CBC WITH DIFFERENTIAL (CANCER CENTER ONLY)
Abs Immature Granulocytes: 0.03 10*3/uL (ref 0.00–0.07)
Basophils Absolute: 0.1 10*3/uL (ref 0.0–0.1)
Basophils Relative: 1 %
Eosinophils Absolute: 0.2 10*3/uL (ref 0.0–0.5)
Eosinophils Relative: 2 %
HCT: 43.5 % (ref 36.0–46.0)
Hemoglobin: 15.1 g/dL — ABNORMAL HIGH (ref 12.0–15.0)
Immature Granulocytes: 0 %
Lymphocytes Relative: 22 %
Lymphs Abs: 2.1 10*3/uL (ref 0.7–4.0)
MCH: 34.4 pg — ABNORMAL HIGH (ref 26.0–34.0)
MCHC: 34.7 g/dL (ref 30.0–36.0)
MCV: 99.1 fL (ref 80.0–100.0)
Monocytes Absolute: 0.7 10*3/uL (ref 0.1–1.0)
Monocytes Relative: 7 %
Neutro Abs: 6.7 10*3/uL (ref 1.7–7.7)
Neutrophils Relative %: 68 %
Platelet Count: 229 10*3/uL (ref 150–400)
RBC: 4.39 MIL/uL (ref 3.87–5.11)
RDW: 13.5 % (ref 11.5–15.5)
WBC Count: 9.8 10*3/uL (ref 4.0–10.5)
nRBC: 0 % (ref 0.0–0.2)

## 2021-09-22 LAB — CMP (CANCER CENTER ONLY)
ALT: 42 U/L (ref 0–44)
AST: 44 U/L — ABNORMAL HIGH (ref 15–41)
Albumin: 4.4 g/dL (ref 3.5–5.0)
Alkaline Phosphatase: 63 U/L (ref 38–126)
Anion gap: 6 (ref 5–15)
BUN: 7 mg/dL (ref 6–20)
CO2: 27 mmol/L (ref 22–32)
Calcium: 8.9 mg/dL (ref 8.9–10.3)
Chloride: 104 mmol/L (ref 98–111)
Creatinine: 0.71 mg/dL (ref 0.44–1.00)
GFR, Estimated: >60 mL/min (ref >60)
Glucose, Bld: 101 mg/dL — ABNORMAL HIGH (ref 70–99)
Potassium: 3.6 mmol/L (ref 3.5–5.1)
Sodium: 137 mmol/L (ref 135–145)
Total Bilirubin: 0.6 mg/dL (ref 0.3–1.2)
Total Protein: 7.4 g/dL (ref 6.5–8.1)

## 2021-09-22 MED ORDER — SODIUM CHLORIDE 0.9% FLUSH
10.0000 mL | INTRAVENOUS | Status: DC | PRN
  Administered 2021-09-22: 10 mL

## 2021-09-22 MED ORDER — ACETAMINOPHEN 325 MG PO TABS
650.0000 mg | ORAL_TABLET | Freq: Once | ORAL | Status: AC
  Administered 2021-09-22: 650 mg via ORAL
  Filled 2021-09-22: qty 2

## 2021-09-22 MED ORDER — LORATADINE 10 MG PO TABS
10.0000 mg | ORAL_TABLET | Freq: Once | ORAL | Status: AC
  Administered 2021-09-22: 10 mg via ORAL
  Filled 2021-09-22: qty 1

## 2021-09-22 MED ORDER — GOSERELIN ACETATE 3.6 MG ~~LOC~~ IMPL
3.6000 mg | DRUG_IMPLANT | Freq: Once | SUBCUTANEOUS | Status: AC
  Administered 2021-09-22: 3.6 mg via SUBCUTANEOUS
  Filled 2021-09-22: qty 3.6

## 2021-09-22 MED ORDER — HEPARIN SOD (PORK) LOCK FLUSH 100 UNIT/ML IV SOLN
500.0000 [IU] | Freq: Once | INTRAVENOUS | Status: AC | PRN
  Administered 2021-09-22: 500 [IU]

## 2021-09-22 MED ORDER — SODIUM CHLORIDE 0.9% FLUSH
10.0000 mL | Freq: Once | INTRAVENOUS | Status: AC
  Administered 2021-09-22: 10 mL

## 2021-09-22 MED ORDER — SODIUM CHLORIDE 0.9 % IV SOLN
3.4000 mg/kg | Freq: Once | INTRAVENOUS | Status: AC
  Administered 2021-09-22: 260 mg via INTRAVENOUS
  Filled 2021-09-22: qty 8

## 2021-09-22 MED ORDER — SODIUM CHLORIDE 0.9 % IV SOLN: Freq: Once | INTRAVENOUS | Status: AC

## 2021-09-22 NOTE — Progress Notes (Signed)
Patient Care Team: [facility] [place] as PCP - General (Family Medicine) Kyung Rudd, MD as Consulting Physician (Radiation Oncology) Armandina Gemma, MD as Consulting Physician (General Surgery) Rockwell Germany, RN as Oncology Nurse Navigator Mauro Kaufmann, RN as Oncology Nurse Navigator Nicholas Lose, MD as Consulting Physician (Hematology and Oncology) Jovita Kussmaul, MD as Consulting Physician (General Surgery)  DIAGNOSIS:  Encounter Diagnosis  Name Primary?   Malignant neoplasm of upper-inner quadrant of right breast in female, estrogen receptor positive ([place])     SUMMARY OF ONCOLOGIC HISTORY: Oncology History  Malignant neoplasm of upper-inner quadrant of right breast in female, estrogen receptor positive ([place])  07/08/2020 Initial Diagnosis   a clinical mT2 N3, stage IIIA invasive ductal carcinoma, grade 1, triple positive  (a) breast MRI 07/06/2020 finds a 1.6 cm lower inner quadrant mass associated with 5.4 cm of non-mass-like enhancement, at least 8 enlarged right axillary lymph node and 1 right internal mammary lymph node  (b) CT scans of the neck and chest 06/24/2020 shows subcentimeter right cervical adenopathy, 2 right supraclavicular lymph nodes the larger 0.65 cm, also left cervical adenopathy largest measuring 1.02 cm; there is also a 1.2 cm lesion in the left hepatic lobe, and a sclerotic lesion in the mid thoracic spine at T7  (c) PET scan 07/21/2020 shows regional adenopathy (including 2 right subcentimeter supraclavicular lymph nodes and subcentimeter lymph nodes in the right hilar, subcarinal and lateral aortic region), no uptake in liver, and an indeterminate T12 lesion  (d) thoracic spine MRI 07/27/2020 shows the T12 lesion to be likely benign, there is also degenerative disc disease   07/23/2020 - 12/18/2020 Neo-Adjuvant Chemotherapy   neoadjuvant chemotherapy will consist of carboplatin, docetaxel, trastuzumab, and pertuzumab q. 21 days x 6  07/23/2020-11/06/2020 --two doses of Trastuzumab given on 9/22 and 10/13   01/07/2021 Cancer Staging   Staging form: Breast, AJCC 8th Edition - Pathologic stage from 01/07/2021: No Stage Recommended (ypT2, pN67m, cM0, G2) - Signed by CGardenia Phlegm NP on 01/15/2021 Stage prefix: Post-therapy Histologic grading system: 3 grade system   01/07/2021 Surgery   She underwent bilateral mastectomy on 01/07/2021 that showed: left breast no evidence of malignancy, and the right breast invasive ductal carcinoma grade 2, spanning fibrotic area of approximately 5 cm in patches.  Margins were negative.   (A) Microscopic metastatic carcinoma was noted in 1 out of 15 lymph nodes biopsied.   01/26/2021 -  Chemotherapy   Patient is on Treatment Plan : BREAST ADO-Trastuzumab Emtansine (Kadcyla) q21d     02/13/2021 - 04/01/2021 Radiation Therapy   Adjuvant radiation   04/08/2021 -  Anti-estrogen oral therapy   Antiestrogen therapy with Zolodex and AI starting 04/21/21     CHIEF COMPLIANT Follow-up of Kadcyla  INTERVAL HISTORY: Kayla YERAis a 36y.o. with above-mentioned history of triple positive locally advanced breast cancer, currently on treatment with Kadcyla. She presents to the clinic today for follow-up and treatment. States that she has some sever hot flashes from taking the anastrozole. She does has some fatigue from the Zoladex but she prefer to take Claritin. She does have stiffness but she is trying to be proactive with walking.   ALLERGIES:  has No Known Allergies.  MEDICATIONS:  Current Outpatient Medications  Medication Sig Dispense Refill   acetaminophen (TYLENOL) 325 MG tablet Take 2 tablets (650 mg total) by mouth every 4 (four) hours as needed (for pain scale < 4). 100 tablet 0  anastrozole (ARIMIDEX) 1 MG tablet Take 1 tablet (1 mg total) by mouth daily. 90 tablet 3   calcitRIOL (ROCALTROL) 0.5 MCG capsule Take 1 capsule (0.5 mcg total) by mouth 2 (two) times a week. 30 capsule  1   calcium carbonate (OS-CAL) 600 MG TABS tablet Take 1 tablet by mouth daily.     levonorgestrel (MIRENA) 20 MCG/24HR IUD 1 each by Intrauterine route once.     levothyroxine (SYNTHROID) 175 MCG tablet Take 175 mcg by mouth daily before breakfast.     liothyronine (CYTOMEL) 5 MCG tablet Take 5 mcg by mouth daily.     loratadine (CLARITIN) 10 MG tablet Take 1 tablet (10 mg total) by mouth daily. 90 tablet 0   LORazepam (ATIVAN) 0.5 MG tablet Take 1 tablet (0.5 mg total) by mouth every 12 (twelve) hours as needed for anxiety. 30 tablet 0   methocarbamol (ROBAXIN) 500 MG tablet Take 1 tablet (500 mg total) by mouth every 6 (six) hours as needed for muscle spasms. 30 tablet 2   sertraline (ZOLOFT) 50 MG tablet Take 50 mg by mouth at bedtime.     VYVANSE 60 MG capsule Take 60 mg by mouth every morning.     No current facility-administered medications for this visit.   Facility-Administered Medications Ordered in Other Visits  Medication Dose Route Frequency Provider Last Rate Last Admin   sodium chloride flush (NS) 0.9 % injection 10 mL  10 mL Intravenous PRN Magrinat, Virgie Dad, MD        PHYSICAL EXAMINATION: ECOG PERFORMANCE STATUS: 1 - Symptomatic but completely ambulatory  Vitals:   09/22/21 0919  BP: 119/78  Pulse: 100  Resp: 18  Temp: (!) 97.5 F (36.4 C)  SpO2: 100%   Filed Weights   09/22/21 0919  Weight: 163 lb 8 oz (74.2 kg)      LABORATORY DATA:  I have reviewed the data as listed    Latest Ref Rng & Units 09/22/2021    9:04 AM 08/28/2021    9:55 AM 08/07/2021    8:20 AM  CMP  Glucose 70 - 99 mg/dL 101  101  109   BUN 6 - 20 mg/dL '7  8  9   '$ Creatinine 0.44 - 1.00 mg/dL 0.71  0.81  0.72   Sodium 135 - 145 mmol/L 137  138  138   Potassium 3.5 - 5.1 mmol/L 3.6  3.9  3.7   Chloride 98 - 111 mmol/L 104  105  104   CO2 22 - 32 mmol/L '27  27  27   '$ Calcium 8.9 - 10.3 mg/dL 8.9  8.7  9.1   Total Protein 6.5 - 8.1 g/dL 7.4  7.3  7.4   Total Bilirubin 0.3 - 1.2 mg/dL 0.6   0.7  0.7   Alkaline Phos 38 - 126 U/L 63  62  64   AST 15 - 41 U/L 44  45  50   ALT 0 - 44 U/L 42  36  43     Lab Results  Component Value Date   WBC 9.8 09/22/2021   HGB 15.1 (H) 09/22/2021   HCT 43.5 09/22/2021   MCV 99.1 09/22/2021   PLT 229 09/22/2021   NEUTROABS 6.7 09/22/2021    ASSESSMENT & PLAN:  Malignant neoplasm of upper-inner quadrant of right breast in female, estrogen receptor positive (HCC) stage IIIa triple positive breast cancer.   Treatment history: 1.  Initial diagnosis with T2N3 triple positive breast cancer PET scan  showed regional adenopathy with no distant metastasis. 2.  Neoadjuvant chemotherapy with TCHP x6 from 07/23/2020 through 11/06/2020 3.  2 doses of Herceptin on 11/27/2020 and 12/18/2020 4.  Bilateral mastectomies on January 07, 2021 which showed T2 N1 MI residual cancer. 5.  Patient began adjuvant Kadcyla x14 cycles beginning January 26, 2021 through August 25 6.  Adjuvant radiation completed January 25 7.  Followed by adjuvant antiestrogen therapy with Zoladex and anastrozole _______________________________________________________________________________________________ Current treatment: Kadcyla maintenance; Zoladex; anastrozole Kadcyla toxicities: Tolerating it well without any side effects. Anastrozole toxicities: Joint stiffness Zoladex: Hot flashes  Echocardiogram completed 06/05/2021 EF 60-65% Return to clinic every 3 weeks for Kadcyla and every 6 weeks for follow-up with me. She finishes her treatment on 10/28/2021 (the reason for doing it sooner is because she plans to go to the beach before her children school begins) We will request interventional radiology to remove her port after that. She will come monthly for Zoladex injections and in 3 months for survivorship care plan visit. She plans to go to work full-time after her last treatment.  No orders of the defined types were placed in this encounter.  The patient has a good  understanding of the overall plan. she agrees with it. she will call with any problems that may develop before the next visit here. Total time spent: 30 mins including face to face time and time spent for planning, charting and co-ordination of care   Harriette Ohara, MD 09/22/21    I Gardiner Coins am scribing for Dr. Lindi Adie  I have reviewed the above documentation for accuracy and completeness, and I agree with the above.

## 2021-09-22 NOTE — Patient Instructions (Signed)
[facility] MEDICAL [facility]COLOGY  Discharge Instructions: °Thank you for choosing [facility] to provide your oncology and hematology care.  ° °If you have a lab appointment with the Cancer Center, please go directly to the Cancer Center and check in at the registration area. °  °Wear comfortable clothing and clothing appropriate for easy access to any Portacath or PICC line.  ° °We strive to give you quality time with your provider. You may need to reschedule your appointment if you arrive late (15 or more minutes).  Arriving late affects you and other patients whose appointments are after yours.  Also, if you miss three or more appointments without notifying the office, you may be dismissed from the clinic at the provider’s discretion.    °  °For prescription refill requests, have your pharmacy contact our office and allow 72 hours for refills to be completed.   ° °Today you received the following chemotherapy and/or immunotherapy agents Kadcyla    °  °To help prevent nausea and vomiting after your treatment, we encourage you to take your nausea medication as directed. ° °BELOW ARE SYMPTOMS THAT SHOULD BE REPORTED IMMEDIATELY: °*FEVER GREATER THAN 100.4 F (38 °C) OR HIGHER °*CHILLS OR SWEATING °*NAUSEA AND VOMITING THAT IS NOT CONTROLLED WITH YOUR NAUSEA MEDICATION °*UNUSUAL SHORTNESS OF BREATH °*UNUSUAL BRUISING OR BLEEDING °*URINARY PROBLEMS (pain or burning when urinating, or frequent urination) °*BOWEL PROBLEMS (unusual diarrhea, constipation, pain near the anus) °TENDERNESS IN MOUTH AND THROAT WITH OR WITHOUT PRESENCE OF ULCERS (sore throat, sores in mouth, or a toothache) °UNUSUAL RASH, SWELLING OR PAIN  °UNUSUAL VAGINAL DISCHARGE OR ITCHING  ° °Items with * indicate a potential emergency and should be followed up as soon as possible or go to the Emergency Department if any problems should occur. ° °Please show the CHEMOTHERAPY ALERT CARD or IMMUNOTHERAPY ALERT CARD at check-in to the  Emergency Department and triage nurse. ° °Should you have questions after your visit or need to cancel or reschedule your appointment, please contact [facility]  Dept: [phone]  and follow the prompts.  Office hours are 8:00 a.m. to 4:30 p.m. Monday - Friday. Please note that voicemails left after 4:00 p.m. may not be returned until the following business day.  We are closed weekends and major holidays. You have access to a nurse at all times for urgent questions. Please call the main number to the clinic Dept: [phone] and follow the prompts. ° ° °For any non-urgent questions, you may also contact your provider using MyChart. We now offer e-Visits for anyone 18 and older to request care online for non-urgent symptoms. For details visit [email or web address]. °  °Also download the MyChart app! Go to the app store, search "MyChart", open the app, select , and log in with your MyChart username and password. ° °Due to Covid, a mask is required upon entering the hospital/clinic. If you do not have a mask, one will be given to you upon arrival. For doctor visits, patients may have 1 support person aged 18 or older with them. For treatment visits, patients cannot have anyone with them due to current Covid guidelines and our immunocompromised population.  ° °

## 2021-09-22 NOTE — Progress Notes (Signed)
Verbal orders received from MD for pt to have port removed 1-2 weeks after completion of tx on 10/28/21.  Orders placed to be completed at [facility] IR department.

## 2021-09-22 NOTE — Assessment & Plan Note (Signed)
stage IIIa triple positive breast cancer.  Treatment history: 1. Initial diagnosis with T2N3 triple positive breast cancer PET scan showed regional adenopathy with no distant metastasis. 2. Neoadjuvant chemotherapy with TCHP x6 from 07/23/2020 through 11/06/2020 3. 2 doses of Herceptin on 11/27/2020 and 12/18/2020 4. Bilateral mastectomies on January 07, 2021 which showed T2 N1 MI residual cancer. 5. Patient began adjuvant Kadcyla x14 cycles beginning January 26, 2021 through August 25 6.Adjuvant radiation completed January 25 7. Followed by adjuvant antiestrogen therapy with Zoladex and anastrozole _______________________________________________________________________________________________ Current treatment: Kadcyla maintenance; Zoladex; anastrozole Kadcyla toxicities: Tolerating it well without any side effects.  Echocardiogram completed 06/05/2021 EF 60-65% Return to clinic every 3 weeks for Kadcyla and every 6 weeks for follow-up with me.

## 2021-09-22 NOTE — Progress Notes (Signed)
Pt declined 30 min post Kadcyla Observation

## 2021-09-23 ENCOUNTER — Telehealth: Payer: Self-pay | Admitting: Hematology and Oncology

## 2021-09-23 NOTE — Telephone Encounter (Signed)
Scheduled appointment per 7/18 los message.

## 2021-09-24 ENCOUNTER — Encounter: Payer: Self-pay | Admitting: *Deleted

## 2021-09-28 ENCOUNTER — Other Ambulatory Visit: Payer: Self-pay

## 2021-10-06 ENCOUNTER — Other Ambulatory Visit: Payer: Self-pay

## 2021-10-13 ENCOUNTER — Other Ambulatory Visit: Payer: Self-pay | Admitting: *Deleted

## 2021-10-13 DIAGNOSIS — C50211 Malignant neoplasm of upper-inner quadrant of right female breast: Secondary | ICD-10-CM

## 2021-10-14 ENCOUNTER — Inpatient Hospital Stay: Payer: 59 | Admitting: Hematology and Oncology

## 2021-10-14 ENCOUNTER — Inpatient Hospital Stay: Payer: 59

## 2021-10-14 ENCOUNTER — Inpatient Hospital Stay: Payer: 59 | Attending: Hematology and Oncology

## 2021-10-14 ENCOUNTER — Other Ambulatory Visit: Payer: Self-pay

## 2021-10-14 VITALS — BP 112/74 | HR 88 | Temp 98.2°F | Resp 18 | Wt 162.0 lb

## 2021-10-14 DIAGNOSIS — Z5112 Encounter for antineoplastic immunotherapy: Secondary | ICD-10-CM | POA: Insufficient documentation

## 2021-10-14 DIAGNOSIS — Z170 Estrogen receptor positive status [ER+]: Secondary | ICD-10-CM

## 2021-10-14 DIAGNOSIS — Z95828 Presence of other vascular implants and grafts: Secondary | ICD-10-CM

## 2021-10-14 DIAGNOSIS — C50211 Malignant neoplasm of upper-inner quadrant of right female breast: Secondary | ICD-10-CM | POA: Diagnosis present

## 2021-10-14 LAB — CBC WITH DIFFERENTIAL (CANCER CENTER ONLY)
Abs Immature Granulocytes: 0.02 10*3/uL (ref 0.00–0.07)
Basophils Absolute: 0.1 10*3/uL (ref 0.0–0.1)
Basophils Relative: 1 %
Eosinophils Absolute: 0.2 10*3/uL (ref 0.0–0.5)
Eosinophils Relative: 3 %
HCT: 43.7 % (ref 36.0–46.0)
Hemoglobin: 15 g/dL (ref 12.0–15.0)
Immature Granulocytes: 0 %
Lymphocytes Relative: 29 %
Lymphs Abs: 1.8 10*3/uL (ref 0.7–4.0)
MCH: 34.3 pg — ABNORMAL HIGH (ref 26.0–34.0)
MCHC: 34.3 g/dL (ref 30.0–36.0)
MCV: 100 fL (ref 80.0–100.0)
Monocytes Absolute: 0.6 10*3/uL (ref 0.1–1.0)
Monocytes Relative: 10 %
Neutro Abs: 3.6 10*3/uL (ref 1.7–7.7)
Neutrophils Relative %: 57 %
Platelet Count: 247 10*3/uL (ref 150–400)
RBC: 4.37 MIL/uL (ref 3.87–5.11)
RDW: 13.6 % (ref 11.5–15.5)
WBC Count: 6.4 10*3/uL (ref 4.0–10.5)
nRBC: 0 % (ref 0.0–0.2)

## 2021-10-14 LAB — CMP (CANCER CENTER ONLY)
ALT: 33 U/L (ref 0–44)
AST: 48 U/L — ABNORMAL HIGH (ref 15–41)
Albumin: 4.3 g/dL (ref 3.5–5.0)
Alkaline Phosphatase: 66 U/L (ref 38–126)
Anion gap: 6 (ref 5–15)
BUN: 6 mg/dL (ref 6–20)
CO2: 28 mmol/L (ref 22–32)
Calcium: 8.4 mg/dL — ABNORMAL LOW (ref 8.9–10.3)
Chloride: 105 mmol/L (ref 98–111)
Creatinine: 0.72 mg/dL (ref 0.44–1.00)
GFR, Estimated: >60 mL/min (ref >60)
Glucose, Bld: 100 mg/dL — ABNORMAL HIGH (ref 70–99)
Potassium: 3.7 mmol/L (ref 3.5–5.1)
Sodium: 139 mmol/L (ref 135–145)
Total Bilirubin: 0.3 mg/dL (ref 0.3–1.2)
Total Protein: 7.4 g/dL (ref 6.5–8.1)

## 2021-10-14 MED ORDER — ACETAMINOPHEN 325 MG PO TABS
650.0000 mg | ORAL_TABLET | Freq: Once | ORAL | Status: AC
  Administered 2021-10-14: 650 mg via ORAL

## 2021-10-14 MED ORDER — HEPARIN SOD (PORK) LOCK FLUSH 100 UNIT/ML IV SOLN
500.0000 [IU] | Freq: Once | INTRAVENOUS | Status: AC | PRN
  Administered 2021-10-14: 500 [IU]

## 2021-10-14 MED ORDER — LORATADINE 10 MG PO TABS
10.0000 mg | ORAL_TABLET | Freq: Once | ORAL | Status: AC
  Administered 2021-10-14: 10 mg via ORAL

## 2021-10-14 MED ORDER — SODIUM CHLORIDE 0.9 % IV SOLN: Freq: Once | INTRAVENOUS | Status: AC

## 2021-10-14 MED ORDER — LORATADINE 10 MG PO TABS
ORAL_TABLET | ORAL | Status: AC
  Filled 2021-10-14: qty 1

## 2021-10-14 MED ORDER — SODIUM CHLORIDE 0.9% FLUSH
10.0000 mL | INTRAVENOUS | Status: DC | PRN
  Administered 2021-10-14: 10 mL

## 2021-10-14 MED ORDER — SODIUM CHLORIDE 0.9% FLUSH
10.0000 mL | Freq: Once | INTRAVENOUS | Status: AC
  Administered 2021-10-14: 10 mL

## 2021-10-14 MED ORDER — ACETAMINOPHEN 325 MG PO TABS
ORAL_TABLET | ORAL | Status: AC
  Filled 2021-10-14: qty 2

## 2021-10-14 MED ORDER — SODIUM CHLORIDE 0.9 % IV SOLN
3.4000 mg/kg | Freq: Once | INTRAVENOUS | Status: AC
  Administered 2021-10-14: 260 mg via INTRAVENOUS
  Filled 2021-10-14: qty 8

## 2021-10-14 NOTE — Progress Notes (Signed)
Patient declined to stay for 30 minute post Kadcyla observation. 

## 2021-10-14 NOTE — Patient Instructions (Signed)
Ado-Trastuzumab Emtansine Injection What is this medication? ADO-TRASTUZUMAB EMTANSINE (ADD oh traz TOO zuh mab em TAN zine) treats breast cancer. It works by blocking a protein that causes cancer cells to grow and multiply. This helps to slow or stop the spread of cancer cells. This medicine may be used for other purposes; ask your health care provider or pharmacist if you have questions. COMMON BRAND NAME(S): Kadcyla What should I tell my care team before I take this medication? They need to know if you have any of these conditions: Heart failure Liver disease Low platelet levels Lung disease Tingling of the fingers or toes or other nerve disorder An unusual or allergic reaction to ado-trastuzumab emtansine, other medications, foods, dyes, or preservatives Pregnant or trying to get pregnant Breast-feeding How should I use this medication? This medication is infused into a vein. It is given by your care team in a hospital or clinic setting. Talk to your care team about the use of this medication in children. Special care may be needed. Overdosage: If you think you have taken too much of this medicine contact a poison control center or emergency room at once. NOTE: This medicine is only for you. Do not share this medicine with others. What if I miss a dose? Keep appointments for follow-up doses. It is important not to miss your dose. Call your care team if you are unable to keep an appointment. What may interact with this medication? Atazanavir Boceprevir Clarithromycin Dalfopristin; quinupristin Delavirdine Indinavir Isoniazid, INH Itraconazole Ketoconazole Nefazodone Nelfinavir Ritonavir Telaprevir Telithromycin Tipranavir Voriconazole This list may not describe all possible interactions. Give your health care provider a list of all the medicines, herbs, non-prescription drugs, or dietary supplements you use. Also tell them if you smoke, drink alcohol, or use illegal drugs.  Some items may interact with your medicine. What should I watch for while using this medication? This medication may make you feel generally unwell. This is not uncommon, as chemotherapy can affect healthy cells as well as cancer cells. Report any side effects. Continue your course of treatment even though you feel ill unless your care team tells you to stop. You may need blood work while taking this medication. This medication may increase your risk to bruise or bleed. Call your care team if you notice any unusual bleeding. Be careful brushing or flossing your teeth or using a toothpick because you may get an infection or bleed more easily. If you have any dental work done, tell your dentist you are receiving this medication. Talk to your care team if you may be pregnant. Serious birth defects can occur if you take this medication during pregnancy and for 7 months after the last dose. You will need a negative pregnancy test before starting this medication. Contraception is recommended while taking this medication and for 7 months after the last dose. Your care team can help you find the option that works for you. If your partner can get pregnant, use a condom during sex while taking this medication and for 4 months after the last dose. Do not breastfeed while taking this medication and for 7 months after the last dose. This medication may cause infertility. Talk to your care team if you are concerned with your fertility. What side effects may I notice from receiving this medication? Side effects that you should report to your care team as soon as possible: Allergic reactions--skin rash, itching, hives, swelling of the face, lips, tongue, or throat Bleeding--bloody or black, tar-like stools, vomiting   blood or brown material that looks like coffee grounds, red or dark brown urine, small red or purple spots on skin, unusual bruising or bleeding Dry cough, shortness of breath or trouble breathing Heart  failure--shortness of breath, swelling of the ankles, feet, or hands, sudden weight gain, unusual weakness or fatigue Infusion reactions--chest pain, shortness of breath or trouble breathing, feeling faint or lightheaded Liver injury--right upper belly pain, loss of appetite, nausea, light-colored stool, dark yellow or brown urine, yellowing skin or eyes, unusual weakness or fatigue Pain, tingling, or numbness in the hands or feet Painful swelling, warmth, or redness of the skin, blisters or sores at the infusion site Side effects that usually do not require medical attention (report to your care team if they continue or are bothersome): Constipation Fatigue Headache Muscle pain Nausea This list may not describe all possible side effects. Call your doctor for medical advice about side effects. You may report side effects to FDA at 1-800-FDA-1088. Where should I keep my medication? This medication is given in a hospital or clinic. It will not be stored at home. NOTE: This sheet is a summary. It may not cover all possible information. If you have questions about this medicine, talk to your doctor, pharmacist, or health care provider.  2023 Elsevier/Gold Standard (2021-07-10 00:00:00)  

## 2021-10-19 ENCOUNTER — Telehealth: Payer: Self-pay | Admitting: *Deleted

## 2021-10-19 ENCOUNTER — Other Ambulatory Visit (HOSPITAL_COMMUNITY): Payer: Self-pay

## 2021-10-19 ENCOUNTER — Encounter: Payer: Self-pay | Admitting: Hematology and Oncology

## 2021-10-19 MED ORDER — MOLNUPIRAVIR EUA 200MG CAPSULE
4.0000 | ORAL_CAPSULE | Freq: Two times a day (BID) | ORAL | 0 refills | Status: AC
  Filled 2021-10-19: qty 40, 5d supply, fill #0

## 2021-10-19 NOTE — Telephone Encounter (Signed)
Received call from pt stating she has tested positive for Covid.  Due to pt symptoms of fever of 100.2, body aches/chills, verbal orders received from MD for pt to start [place].  Prescription sent to [facility] out pt pharmacy. Pt educated on medication and verbalized understanding.  Pt currently scheduled next week for last Kadcyla tx.  Per MD Kadcyla can be discontinued at this time and pt needing to f/u in office in 21 days with lab, MD visit, and Zoladex injection.  Pt educated and verbalized understanding.  Scheduling message with above information. Pt educated to contact the office if symptoms become worse, pt verbalized understanding.

## 2021-10-19 NOTE — Telephone Encounter (Signed)
Received call from pt with complaint of fatigue, post nasal drainage, body aches, and temperature of 100.2 x 24 hrs. Pt also complaint of superficial chest wall muscle tightness x several days.  Pt states Covid test negative yesterday 09/17/21.  RN advised pt to re test today for Covid. Pt verbalized understanding and will contact office.

## 2021-10-20 ENCOUNTER — Telehealth: Payer: Self-pay | Admitting: Hematology and Oncology

## 2021-10-20 ENCOUNTER — Other Ambulatory Visit: Payer: 59

## 2021-10-20 ENCOUNTER — Other Ambulatory Visit (HOSPITAL_COMMUNITY): Payer: Self-pay

## 2021-10-20 NOTE — Telephone Encounter (Signed)
Scheduled appointment per 8/14 staff message. Left message.

## 2021-10-21 ENCOUNTER — Other Ambulatory Visit: Payer: Self-pay

## 2021-10-26 ENCOUNTER — Other Ambulatory Visit (HOSPITAL_COMMUNITY): Payer: Self-pay

## 2021-10-28 ENCOUNTER — Inpatient Hospital Stay: Payer: 59 | Admitting: Hematology and Oncology

## 2021-10-28 ENCOUNTER — Inpatient Hospital Stay: Payer: 59

## 2021-10-28 ENCOUNTER — Encounter: Payer: Self-pay | Admitting: *Deleted

## 2021-10-28 DIAGNOSIS — Z170 Estrogen receptor positive status [ER+]: Secondary | ICD-10-CM

## 2021-10-30 ENCOUNTER — Ambulatory Visit: Payer: 59 | Attending: Hematology and Oncology

## 2021-10-30 ENCOUNTER — Other Ambulatory Visit: Payer: Self-pay

## 2021-10-30 DIAGNOSIS — C50311 Malignant neoplasm of lower-inner quadrant of right female breast: Secondary | ICD-10-CM | POA: Insufficient documentation

## 2021-10-30 DIAGNOSIS — C50211 Malignant neoplasm of upper-inner quadrant of right female breast: Secondary | ICD-10-CM | POA: Insufficient documentation

## 2021-10-30 DIAGNOSIS — Z483 Aftercare following surgery for neoplasm: Secondary | ICD-10-CM | POA: Insufficient documentation

## 2021-10-30 DIAGNOSIS — M79621 Pain in right upper arm: Secondary | ICD-10-CM | POA: Insufficient documentation

## 2021-10-30 DIAGNOSIS — Z170 Estrogen receptor positive status [ER+]: Secondary | ICD-10-CM | POA: Insufficient documentation

## 2021-10-30 NOTE — Therapy (Addendum)
 OUTPATIENT PHYSICAL THERAPY ONCOLOGY EVALUATION  Patient Name: Kayla Little MRN: [id number] DOB:[birth year], 36 y.o., female Today's Date: 10/30/2021   PT End of Session - 10/30/21 1207     Visit Number 1    Number of Visits 12    Date for PT Re-Evaluation 12/11/21    PT Start Time 1105    PT Stop Time 1150    PT Time Calculation (min) 45 min    Activity Tolerance Patient tolerated treatment well    Behavior During Therapy [facility] for tasks assessed/performed             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)    Hx of varicella    Kidney stone 2012   Medullary thyroid  carcinoma (HCC)    Monoallelic mutation of CHEK2 gene in female patient    Postpartum care following vaginal delivery (7/27) 10/02/2014, June 16 , 2020   Second degree perineal laceration 08/23/2018   SVD (spontaneous vaginal delivery) 08/23/2018   Thyroid  cancer (HCC) 2021   Past Surgical History:  Procedure Laterality Date   IR IMAGING GUIDED PORT INSERTION  07/22/2020   MODIFIED MASTECTOMY Right 01/07/2021   Procedure: RIGHT MODIFIED RADICAL MASTECTOMY;  Surgeon: Curvin Deward MOULD, MD;  Location: [facility];  Service: General;  Laterality: Right;   NO PAST SURGERIES     SIMPLE MASTECTOMY WITH AXILLARY SENTINEL NODE BIOPSY Left 01/07/2021   Procedure: LEFT SIMPLE MASTECTOMY;  Surgeon: Curvin Deward MOULD, MD;  Location:  SURGERY CENTER;  Service: General;  Laterality: Left;   THYROIDECTOMY N/A 04/12/2019   Procedure: TOTAL THYROIDECTOMY WITH LIMITED LYMPH NODE DISSECTION;  Surgeon: Eletha Boas, MD;  Location: [facility];  Service: General;  Laterality: N/A;   UNILATERAL SALPINGECTOMY Right  07/09/2017   Procedure: RIGHT SALPINGECTOMY WITH REMOVAL OF ECTOPIC PREGNANCY;  Surgeon: Barbette Knock, MD;  Location: [facility];  Service: Gynecology;  Laterality: Right;   Patient Active Problem List   Diagnosis Date Noted   Port-A-Cath in place 03/10/2021   Anxiety 01/23/2021   Attention deficit disorder 01/23/2021   History of medullary carcinoma of thyroid  01/23/2021   History of substance abuse (HCC) 01/23/2021   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   Prolonged QT interval 07/30/2020   Bone lesion 07/23/2020   MEN 2A (multiple endocrine neoplasia, type 2A) (HCC) 04/12/2019   Multiple thyroid  nodules 04/08/2019   Family history of breast cancer    Family history of prostate cancer    Family history of lymphoma    Family history of ovarian cancer    Monoallelic mutation of CHEK2 gene in female patient    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)    Malignant neoplasm of upper-inner quadrant of right breast in female, estrogen receptor positive (HCC) 08/22/2018   Type O blood, Rh negative 10/08/2014    PCP: [facility]  REFERRING PROVIDER: Mackey Chad  REFERRING DIAG: Eval and treat for cording/lymphedema  THERAPY DIAG:  Aftercare following surgery for neoplasm  Malignant neoplasm of lower-inner quadrant of right breast of female, estrogen receptor positive (HCC)  Pain of right upper arm  ONSET DATE: 1 week ago  Rationale for Evaluation and Treatment Rehabilitation  SUBJECTIVE                                                                                                                                                                                           SUBJECTIVE STATEMENT: I felt tightness/pulling in my right forearm then it subsided, and then about a week ago I had a cough and my chest has been sore. The chest soreness went away, and then 2 days ago I shot the basketball and woke up yesterday  with my hand numb and feeling swollen.  I feel some swelling under my right arm. And now I have a lot of pulling in my pectorals and around the back into my triceps. Its really painful in the axillary region to move my right arm   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 She underwent neoadjuvant chemotherapy from 07/23/2020-11/06/2020 followed by a bilateral mastectomy with a right axillary lymph node dissection (1/15 nodes positive). She has a hx of medullary thyroid  cancer with a thyroidectomy 04/12/2019. She smokes cigarettes infrequently and drinks red wine daily with varying amounts 0-3 glasses.        PAIN:  Are you having pain? Yes NPRS scale: 0-8/10 Pain location: right armpit/shoulder Pain orientation: Right  PAIN TYPE: sharp and tight Pain description: intermittent and mainly with movement  Aggravating factors: using her  right arm with raising, pulling or pushing Relieving factors: NSAID's  PRECAUTIONS: Other: right UE lymphedema risk  WEIGHT BEARING RESTRICTIONS No  FALLS:  Has patient fallen in last 6 months? No  LIVING ENVIRONMENT: Lives with: lives with their family Lives in: House/apartment Stairs: Yes; Internal: 10 steps; on right going up Has following equipment at home: None  OCCUPATION: Accountant  LEISURE: soccer, playing with kids  HAND DOMINANCE : right   PRIOR LEVEL OF FUNCTION: Independent  PATIENT GOALS Be able to move right arm without pain   OBJECTIVE  COGNITION:  Overall cognitive status: Within functional limits for tasks assessed   PALPATION: Very tender right pectorals especially axillary border of pecs. Several deep cords palpated at axillary region but unable to be palpated in forearm  OBSERVATIONS / OTHER ASSESSMENTS: well healed bilateral mastectomy incisions, mild skin darkening on right from radiation  SENSATION:  Light touch: Deficits  right chest, upper arm  POSTURE: forward head rounded shoulders  UPPER EXTREMITY AROM/PROM:  A/PROM RIGHT   eval   Shoulder extension   Shoulder flexion 115 pain posterior  arm/axilla  Shoulder abduction 55  Shoulder internal rotation   Shoulder external rotation     (Blank rows = not tested)  A/PROM LEFT   eval  Shoulder extension   Shoulder flexion 165  Shoulder abduction 180  Shoulder internal rotation   Shoulder external rotation     (Blank rows = not tested)   CERVICAL AROM: All within normal limits:   UPPER EXTREMITY STRENGTH: WFL   LYMPHEDEMA ASSESSMENTS:   SURGERY TYPE/DATE: Bilateral Mastectomies (left Prophylactic  NUMBER OF LYMPH NODES REMOVED: 1/15  CHEMOTHERAPY: yes  RADIATION:yes  HORMONE TREATMENT: yes, Anastrozole   INFECTIONS: no  LYMPHEDEMA ASSESSMENTS:   LANDMARK RIGHT  eval  10 cm proximal to olecranon process 29.1  Olecranon process 24.6  10 cm proximal to ulnar styloid process 23.4  Just proximal to ulnar styloid process 16.7  Across hand at thumb web space 20.4  At base of 2nd digit 6.6  (Blank rows = not tested)  LANDMARK LEFT  eval  10 cm proximal to olecranon process 29.2  Olecranon process 24.4  10 cm proximal to ulnar styloid process 22.1  Just proximal to ulnar styloid process 16.8  Across hand at thumb web space 19.9  At base of 2nd digit 6.5  (Blank rows = not tested)  LDEX performed today 1.5 over baseline (WNL)   QUICK DASH SURVEY: NA   TODAY'S TREATMENT  Educated to wear compression sleeve to help with cording and to continue post op stretches. Also so add NTS  PATIENT EDUCATION:  Education details: Resume wearing compression sleeve to help with cording, post op stretches, NTS Person educated: Patient Education method: Explanation Education comprehension: verbalized understanding   HOME EXERCISE PROGRAM: Pt will resume post op stretches and NTS  ASSESSMENT:  CLINICAL IMPRESSION: Patient is a 36  y.o. female who was seen today for physical therapy evaluation and treatment for complaints of new cording and pain in the right axillary region, and posterior arm and with concerns for swelling after shooting a basketball a few days ago. She has significant limitations in right shoulder AROM due to pain and tenderness.  There are some small palpable but not visible cords in the right axillary region that are tender. Pectorals and medial arm are tender   OBJECTIVE IMPAIRMENTS decreased ROM, decreased strength, increased fascial restrictions, impaired flexibility, impaired UE functional use, postural dysfunction, and pain.   ACTIVITY LIMITATIONS sleeping, dressing, reach over head, and hygiene/grooming  PARTICIPATION LIMITATIONS: yard work and activities requiring pushing, pulling, reaching  PERSONAL FACTORS 1-2 comorbidities: Right breast CA. S/p chemo and radiation are also affecting patient's functional outcome.   REHAB POTENTIAL: Excellent  CLINICAL DECISION MAKING: Stable/uncomplicated  EVALUATION COMPLEXITY: Low  GOALS: Goals reviewed with patient? Yes  LONG TERM GOALS: Target date: 12/11/2021    Pt will have decreased right UE pain by 50% or greater Baseline:  Goal status: INITIAL  2.  Pt will improve  Right shoulder ROM to WNL Baseline:  Goal status: INITIAL  3.  Pt will be able to push, pull and reach with the right UE for improved ability to perform home and recreational activities Baseline:  Goal status: INITIAL  4  PLAN: PT FREQUENCY: 1-2x/week  PT DURATION: 6 weeks  PLANNED INTERVENTIONS: Therapeutic exercises, Therapeutic activity, Neuromuscular re-education, Patient/Family education, Self Care, Joint mobilization, Orthotic/Fit training, Manual lymph drainage, scar mobilization, Manual therapy, and Re-evaluation  PLAN FOR NEXT SESSION: MFR to cording in Right axilla and recheck arm, STM to UT/Pectorals/Right arm prn, MLD right arm  as needed, review stretches  prn  PHYSICAL THERAPY DISCHARGE SUMMARY  Visits from Start of Care: 1  Current functional level related to goals / functional outcomes: Unknown. Did not return   Remaining deficits: Unknown   Education / Equipment: HEP   Patient agrees to discharge. Patient goals were not assessed. Patient is being discharged due to not returning since the last visit.  Grayce JINNY Sheldon, PT 10/30/2021, 12:11 PM

## 2021-11-04 ENCOUNTER — Ambulatory Visit: Payer: 59

## 2021-11-06 NOTE — Progress Notes (Signed)
Patient Care Team: [facility], [facility] [place] as PCP - General (Family Medicine) Kyung Rudd, MD as Consulting Physician (Radiation Oncology) Armandina Gemma, MD as Consulting Physician (General Surgery) Rockwell Germany, RN as Oncology Nurse Navigator Mauro Kaufmann, RN as Oncology Nurse Navigator Nicholas Lose, MD as Consulting Physician (Hematology and Oncology) Jovita Kussmaul, MD as Consulting Physician (General Surgery)  DIAGNOSIS: No diagnosis found.  SUMMARY OF ONCOLOGIC HISTORY: Oncology History  Malignant neoplasm of upper-inner quadrant of right breast in female, estrogen receptor positive ([place])  07/08/2020 Initial Diagnosis   a clinical mT2 N3, stage IIIA invasive ductal carcinoma, grade 1, triple positive  (a) breast MRI 07/06/2020 finds a 1.6 cm lower inner quadrant mass associated with 5.4 cm of non-mass-like enhancement, at least 8 enlarged right axillary lymph node and 1 right internal mammary lymph node  (b) CT scans of the neck and chest 06/24/2020 shows subcentimeter right cervical adenopathy, 2 right supraclavicular lymph nodes the larger 0.65 cm, also left cervical adenopathy largest measuring 1.02 cm; there is also a 1.2 cm lesion in the left hepatic lobe, and a sclerotic lesion in the mid thoracic spine at T7  (c) PET scan 07/21/2020 shows regional adenopathy (including 2 right subcentimeter supraclavicular lymph nodes and subcentimeter lymph nodes in the right hilar, subcarinal and lateral aortic region), no uptake in liver, and an indeterminate T12 lesion  (d) thoracic spine MRI 07/27/2020 shows the T12 lesion to be likely benign, there is also degenerative disc disease   07/23/2020 - 12/18/2020 Neo-Adjuvant Chemotherapy   neoadjuvant chemotherapy will consist of carboplatin, docetaxel, trastuzumab, and pertuzumab q. 21 days x 6 07/23/2020-11/06/2020 --two doses of Trastuzumab given on 9/22 and 10/13   01/07/2021 Cancer Staging   Staging form: Breast, AJCC  8th Edition - Pathologic stage from 01/07/2021: No Stage Recommended (ypT2, pN7m, cM0, G2) - Signed by CGardenia Phlegm NP on 01/15/2021 Stage prefix: Post-therapy Histologic grading system: 3 grade system   01/07/2021 Surgery   She underwent bilateral mastectomy on 01/07/2021 that showed: left breast no evidence of malignancy, and the right breast invasive ductal carcinoma grade 2, spanning fibrotic area of approximately 5 cm in patches.  Margins were negative.   (A) Microscopic metastatic carcinoma was noted in 1 out of 15 lymph nodes biopsied.   01/26/2021 -  Chemotherapy   Patient is on Treatment Plan : BREAST ADO-Trastuzumab Emtansine (Kadcyla) q21d     02/13/2021 - 04/01/2021 Radiation Therapy   Adjuvant radiation   04/08/2021 -  Anti-estrogen oral therapy   Antiestrogen therapy with Zolodex and AI starting 04/21/21     CHIEF COMPLIANT:   INTERVAL HISTORY: Kayla HAMis a   ALLERGIES:  has No Known Allergies.  MEDICATIONS:  Current Outpatient Medications  Medication Sig Dispense Refill   acetaminophen (TYLENOL) 325 MG tablet Take 2 tablets (650 mg total) by mouth every 4 (four) hours as needed (for pain scale < 4). 100 tablet 0   anastrozole (ARIMIDEX) 1 MG tablet Take 1 tablet (1 mg total) by mouth daily. 90 tablet 3   calcitRIOL (ROCALTROL) 0.5 MCG capsule Take 1 capsule (0.5 mcg total) by mouth 2 (two) times a week. 30 capsule 1   calcium carbonate (OS-CAL) 600 MG TABS tablet Take 1 tablet by mouth daily.     levonorgestrel (MIRENA) 20 MCG/24HR IUD 1 each by Intrauterine route once.     levothyroxine (SYNTHROID) 175 MCG tablet Take 175 mcg by mouth daily before breakfast.  liothyronine (CYTOMEL) 5 MCG tablet Take 5 mcg by mouth daily.     loratadine (CLARITIN) 10 MG tablet Take 1 tablet (10 mg total) by mouth daily. 90 tablet 0   LORazepam (ATIVAN) 0.5 MG tablet Take 1 tablet (0.5 mg total) by mouth every 12 (twelve) hours as needed for anxiety. 30 tablet 0    methocarbamol (ROBAXIN) 500 MG tablet Take 1 tablet (500 mg total) by mouth every 6 (six) hours as needed for muscle spasms. 30 tablet 2   sertraline (ZOLOFT) 50 MG tablet Take 50 mg by mouth at bedtime.     VYVANSE 60 MG capsule Take 60 mg by mouth every morning.     No current facility-administered medications for this visit.   Facility-Administered Medications Ordered in Other Visits  Medication Dose Route Frequency Provider Last Rate Last Admin   sodium chloride flush (NS) 0.9 % injection 10 mL  10 mL Intravenous PRN Magrinat, Virgie Dad, MD        PHYSICAL EXAMINATION: ECOG PERFORMANCE STATUS: {CHL ONC ECOG PQ:[id number]}  There were no vitals filed for this visit. There were no vitals filed for this visit.  BREAST:*** No palpable masses or nodules in either right or left breasts. No palpable axillary supraclavicular or infraclavicular adenopathy no breast tenderness or nipple discharge. (exam performed in the presence of a chaperone)  LABORATORY DATA:  I have reviewed the data as listed    Latest Ref Rng & Units 10/14/2021    8:44 AM 09/22/2021    9:04 AM 08/28/2021    9:55 AM  CMP  Glucose 70 - 99 mg/dL 100  101  101   BUN 6 - 20 mg/dL '6  7  8   '$ Creatinine 0.44 - 1.00 mg/dL 0.72  0.71  0.81   Sodium 135 - 145 mmol/L 139  137  138   Potassium 3.5 - 5.1 mmol/L 3.7  3.6  3.9   Chloride 98 - 111 mmol/L 105  104  105   CO2 22 - 32 mmol/L '28  27  27   '$ Calcium 8.9 - 10.3 mg/dL 8.4  8.9  8.7   Total Protein 6.5 - 8.1 g/dL 7.4  7.4  7.3   Total Bilirubin 0.3 - 1.2 mg/dL 0.3  0.6  0.7   Alkaline Phos 38 - 126 U/L 66  63  62   AST 15 - 41 U/L 48  44  45   ALT 0 - 44 U/L 33  42  36     Lab Results  Component Value Date   WBC 6.4 10/14/2021   HGB 15.0 10/14/2021   HCT 43.7 10/14/2021   MCV 100.0 10/14/2021   PLT 247 10/14/2021   NEUTROABS 3.6 10/14/2021    ASSESSMENT & PLAN:  No problem-specific Assessment & Plan notes found for this encounter.    No orders of the defined  types were placed in this encounter.  The patient has a good understanding of the overall plan. she agrees with it. she will call with any problems that may develop before the next visit here. Total time spent: 30 mins including face to face time and time spent for planning, charting and co-ordination of care   Suzzette Righter, [place] 11/06/21    I Gardiner Coins am scribing for Dr. Lindi Adie  ***

## 2021-11-10 ENCOUNTER — Ambulatory Visit: Payer: 59 | Attending: Hematology and Oncology | Admitting: Physical Therapy

## 2021-11-10 ENCOUNTER — Inpatient Hospital Stay: Payer: 59 | Attending: Hematology and Oncology

## 2021-11-10 ENCOUNTER — Inpatient Hospital Stay: Payer: 59

## 2021-11-10 ENCOUNTER — Other Ambulatory Visit: Payer: Self-pay

## 2021-11-10 ENCOUNTER — Inpatient Hospital Stay (HOSPITAL_BASED_OUTPATIENT_CLINIC_OR_DEPARTMENT_OTHER): Payer: 59 | Admitting: Hematology and Oncology

## 2021-11-10 ENCOUNTER — Other Ambulatory Visit: Payer: Self-pay | Admitting: *Deleted

## 2021-11-10 DIAGNOSIS — Z5112 Encounter for antineoplastic immunotherapy: Secondary | ICD-10-CM | POA: Diagnosis not present

## 2021-11-10 DIAGNOSIS — Z79811 Long term (current) use of aromatase inhibitors: Secondary | ICD-10-CM | POA: Diagnosis not present

## 2021-11-10 DIAGNOSIS — Z79899 Other long term (current) drug therapy: Secondary | ICD-10-CM | POA: Diagnosis not present

## 2021-11-10 DIAGNOSIS — Z95828 Presence of other vascular implants and grafts: Secondary | ICD-10-CM

## 2021-11-10 DIAGNOSIS — C50311 Malignant neoplasm of lower-inner quadrant of right female breast: Secondary | ICD-10-CM | POA: Insufficient documentation

## 2021-11-10 DIAGNOSIS — Z170 Estrogen receptor positive status [ER+]: Secondary | ICD-10-CM

## 2021-11-10 DIAGNOSIS — C50211 Malignant neoplasm of upper-inner quadrant of right female breast: Secondary | ICD-10-CM | POA: Insufficient documentation

## 2021-11-10 DIAGNOSIS — M79621 Pain in right upper arm: Secondary | ICD-10-CM | POA: Insufficient documentation

## 2021-11-10 DIAGNOSIS — Z483 Aftercare following surgery for neoplasm: Secondary | ICD-10-CM | POA: Insufficient documentation

## 2021-11-10 LAB — CBC WITH DIFFERENTIAL (CANCER CENTER ONLY)
Abs Immature Granulocytes: 0.03 10*3/uL (ref 0.00–0.07)
Basophils Absolute: 0.1 10*3/uL (ref 0.0–0.1)
Basophils Relative: 1 %
Eosinophils Absolute: 0.2 10*3/uL (ref 0.0–0.5)
Eosinophils Relative: 1 %
HCT: 42.4 % (ref 36.0–46.0)
Hemoglobin: 14.9 g/dL (ref 12.0–15.0)
Immature Granulocytes: 0 %
Lymphocytes Relative: 18 %
Lymphs Abs: 1.9 10*3/uL (ref 0.7–4.0)
MCH: 34.7 pg — ABNORMAL HIGH (ref 26.0–34.0)
MCHC: 35.1 g/dL (ref 30.0–36.0)
MCV: 98.6 fL (ref 80.0–100.0)
Monocytes Absolute: 0.8 10*3/uL (ref 0.1–1.0)
Monocytes Relative: 8 %
Neutro Abs: 7.5 10*3/uL (ref 1.7–7.7)
Neutrophils Relative %: 72 %
Platelet Count: 256 10*3/uL (ref 150–400)
RBC: 4.3 MIL/uL (ref 3.87–5.11)
RDW: 13.3 % (ref 11.5–15.5)
WBC Count: 10.5 10*3/uL (ref 4.0–10.5)
nRBC: 0 % (ref 0.0–0.2)

## 2021-11-10 LAB — CMP (CANCER CENTER ONLY)
ALT: 36 U/L (ref 0–44)
AST: 43 U/L — ABNORMAL HIGH (ref 15–41)
Albumin: 4.3 g/dL (ref 3.5–5.0)
Alkaline Phosphatase: 64 U/L (ref 38–126)
Anion gap: 6 (ref 5–15)
BUN: 8 mg/dL (ref 6–20)
CO2: 27 mmol/L (ref 22–32)
Calcium: 8.5 mg/dL — ABNORMAL LOW (ref 8.9–10.3)
Chloride: 104 mmol/L (ref 98–111)
Creatinine: 0.7 mg/dL (ref 0.44–1.00)
GFR, Estimated: >60 mL/min (ref >60)
Glucose, Bld: 107 mg/dL — ABNORMAL HIGH (ref 70–99)
Potassium: 3.8 mmol/L (ref 3.5–5.1)
Sodium: 137 mmol/L (ref 135–145)
Total Bilirubin: 0.7 mg/dL (ref 0.3–1.2)
Total Protein: 7.1 g/dL (ref 6.5–8.1)

## 2021-11-10 MED ORDER — GOSERELIN ACETATE 3.6 MG ~~LOC~~ IMPL
3.6000 mg | DRUG_IMPLANT | Freq: Once | SUBCUTANEOUS | Status: AC
  Administered 2021-11-10: 3.6 mg via SUBCUTANEOUS
  Filled 2021-11-10: qty 3.6

## 2021-11-10 MED ORDER — SODIUM CHLORIDE 0.9% FLUSH
10.0000 mL | Freq: Once | INTRAVENOUS | Status: AC
  Administered 2021-11-10: 10 mL

## 2021-11-10 MED ORDER — HEPARIN SOD (PORK) LOCK FLUSH 100 UNIT/ML IV SOLN
500.0000 [IU] | Freq: Once | INTRAVENOUS | Status: AC
  Administered 2021-11-10: 500 [IU]

## 2021-11-10 NOTE — Assessment & Plan Note (Signed)
stage IIIa triple positive breast cancer.  Treatment history: 1. Initial diagnosis with T2N3 triple positive breast cancer PET scan showed regional adenopathy with no distant metastasis. 2. Neoadjuvant chemotherapy with TCHP x6 from 07/23/2020 through 11/06/2020 3. 2 doses of Herceptin on 11/27/2020 and 12/18/2020 4. Bilateral mastectomies on January 07, 2021 which showed T2 N1 MI residual cancer. 5. Patient began adjuvant Kadcyla x14 cycles beginning November 21, 2022through 10/14/2021 6.Adjuvant radiationcompleted January 25 7.Followed by adjuvant antiestrogen therapywith Zoladex and anastrozole _______________________________________________________________________________________________ Current treatment: Zoladex; anastrozole Antiestrogen therapy toxicities:  No role of imaging since she had bilateral mastectomies. Return to clinic monthly for Zoladex and every 3 months for follow-up with me

## 2021-11-10 NOTE — Progress Notes (Signed)
Per MD request RN placed epic orders for home draw Signatera testing.  RN successfully faxed req and path report [phone]).

## 2021-11-12 ENCOUNTER — Ambulatory Visit (HOSPITAL_COMMUNITY)
Admission: RE | Admit: 2021-11-12 | Discharge: 2021-11-12 | Disposition: A | Discharge status: Home / Self Care | Payer: 59 | Source: Ambulatory Visit | Attending: Hematology and Oncology | Admitting: Hematology and Oncology

## 2021-11-12 DIAGNOSIS — Z170 Estrogen receptor positive status [ER+]: Secondary | ICD-10-CM | POA: Insufficient documentation

## 2021-11-12 DIAGNOSIS — Z452 Encounter for adjustment and management of vascular access device: Secondary | ICD-10-CM | POA: Diagnosis not present

## 2021-11-12 DIAGNOSIS — C50211 Malignant neoplasm of upper-inner quadrant of right female breast: Secondary | ICD-10-CM | POA: Insufficient documentation

## 2021-11-12 DIAGNOSIS — Z95828 Presence of other vascular implants and grafts: Secondary | ICD-10-CM

## 2021-11-12 HISTORY — PX: IR REMOVAL TUN ACCESS W/ PORT W/O FL MOD SED: IMG2290

## 2021-11-12 MED ORDER — LIDOCAINE-EPINEPHRINE 1 %-1:100000 IJ SOLN
INTRAMUSCULAR | Status: AC
  Administered 2021-11-12: 10 mL via INTRADERMAL
  Filled 2021-11-12: qty 1

## 2021-11-12 NOTE — Procedures (Signed)
Interventional Radiology Procedure:   Indications: History of right breast cancer and port is no longer needed.   Procedure: Port removal  Findings: Complete removal of left chest port  Complications: No immediate complications noted.     EBL: Minimal  Plan: Discharge to home.  Keep incision dry for 24 hours.   Brittlyn Cloe R. Anselm Pancoast, MD  Pager: [phone]

## 2021-11-16 ENCOUNTER — Ambulatory Visit: Payer: 59

## 2021-11-17 ENCOUNTER — Ambulatory Visit: Payer: 59

## 2021-11-19 ENCOUNTER — Ambulatory Visit: Payer: 59 | Admitting: Rehabilitation

## 2021-11-24 ENCOUNTER — Ambulatory Visit: Payer: 59

## 2021-11-25 ENCOUNTER — Inpatient Hospital Stay: Payer: 59

## 2021-11-26 ENCOUNTER — Encounter: Payer: 59 | Admitting: Rehabilitation

## 2021-12-07 ENCOUNTER — Telehealth: Payer: Self-pay | Admitting: Hematology and Oncology

## 2021-12-07 NOTE — Telephone Encounter (Signed)
Rescheduled appointments per 9/29 staff message; per RN Mel. Patient is aware of the changes made to her upcoming appointments.

## 2021-12-08 ENCOUNTER — Inpatient Hospital Stay: Payer: 59 | Attending: Hematology and Oncology

## 2021-12-08 ENCOUNTER — Other Ambulatory Visit: Payer: Self-pay

## 2021-12-08 ENCOUNTER — Inpatient Hospital Stay: Payer: 59

## 2021-12-08 VITALS — BP 122/75 | HR 95 | Temp 98.7°F | Resp 20

## 2021-12-08 DIAGNOSIS — C50211 Malignant neoplasm of upper-inner quadrant of right female breast: Secondary | ICD-10-CM | POA: Insufficient documentation

## 2021-12-08 DIAGNOSIS — Z170 Estrogen receptor positive status [ER+]: Secondary | ICD-10-CM | POA: Diagnosis not present

## 2021-12-08 DIAGNOSIS — Z95828 Presence of other vascular implants and grafts: Secondary | ICD-10-CM

## 2021-12-08 DIAGNOSIS — Z5111 Encounter for antineoplastic chemotherapy: Secondary | ICD-10-CM | POA: Insufficient documentation

## 2021-12-08 MED ORDER — GOSERELIN ACETATE 3.6 MG ~~LOC~~ IMPL
3.6000 mg | DRUG_IMPLANT | Freq: Once | SUBCUTANEOUS | Status: AC
  Administered 2021-12-08: 3.6 mg via SUBCUTANEOUS
  Filled 2021-12-08: qty 3.6

## 2021-12-23 ENCOUNTER — Inpatient Hospital Stay: Payer: 59

## 2022-01-05 ENCOUNTER — Inpatient Hospital Stay: Payer: 59

## 2022-01-05 VITALS — BP 134/90 | HR 97 | Temp 98.5°F | Resp 20

## 2022-01-05 DIAGNOSIS — C50211 Malignant neoplasm of upper-inner quadrant of right female breast: Secondary | ICD-10-CM

## 2022-01-05 DIAGNOSIS — Z95828 Presence of other vascular implants and grafts: Secondary | ICD-10-CM

## 2022-01-05 DIAGNOSIS — Z5111 Encounter for antineoplastic chemotherapy: Secondary | ICD-10-CM | POA: Diagnosis not present

## 2022-01-05 MED ORDER — GOSERELIN ACETATE 3.6 MG ~~LOC~~ IMPL
3.6000 mg | DRUG_IMPLANT | Freq: Once | SUBCUTANEOUS | Status: AC
  Administered 2022-01-05: 3.6 mg via SUBCUTANEOUS
  Filled 2022-01-05: qty 3.6

## 2022-01-05 NOTE — Patient Instructions (Signed)
Goserelin Implant What is this medication? GOSERELIN (GOE se rel in) treats prostate cancer and breast cancer. It works by decreasing levels of the hormones testosterone and estrogen in the body. This prevents prostate and breast cancer cells from spreading or growing. It may also be used to treat endometriosis. This is a condition where the tissue that lines the uterus grows outside the uterus. It works by decreasing the amount of estrogen your body makes, which reduces heavy bleeding and pain. It can also be used to help thin the lining of the uterus before a surgery used to prevent or reduce heavy periods. This medicine may be used for other purposes; ask your health care provider or pharmacist if you have questions. COMMON BRAND NAME(S): Zoladex, Zoladex 3-Month What should I tell my care team before I take this medication? They need to know if you have any of these conditions: Bone problems Diabetes Heart disease History of irregular heartbeat or rhythm An unusual or allergic reaction to goserelin, other medications, foods, dyes, or preservatives Pregnant or trying to get pregnant Breastfeeding How should I use this medication? This medication is injected under the skin. It is given by your care team in a hospital or clinic setting. Talk to your care team about the use of this medication in children. Special care may be needed. Overdosage: If you think you have taken too much of this medicine contact a poison control center or emergency room at once. NOTE: This medicine is only for you. Do not share this medicine with others. What if I miss a dose? Keep appointments for follow-up doses. It is important not to miss your dose. Call your care team if you are unable to keep an appointment. What may interact with this medication? Do not take this medication with any of the following: Cisapride Dronedarone Pimozide Thioridazine This medication may also interact with the following: Other  medications that cause heart rhythm changes This list may not describe all possible interactions. Give your health care provider a list of all the medicines, herbs, non-prescription drugs, or dietary supplements you use. Also tell them if you smoke, drink alcohol, or use illegal drugs. Some items may interact with your medicine. What should I watch for while using this medication? Visit your care team for regular checks on your progress. Your symptoms may appear to get worse during the first weeks of this therapy. Tell your care team if your symptoms do not start to get better or if they get worse after this time. Using this medication for a long time may weaken your bones. If you smoke or frequently drink alcohol you may increase your risk of bone loss. A family history of osteoporosis, chronic use of medications for seizures (convulsions), or corticosteroids can also increase your risk of bone loss. The risk of bone fractures may be increased. Talk to your care team about your bone health. This medication may increase blood sugar. The risk may be higher in patients who already have diabetes. Ask your care team what you can do to lower your risk of diabetes while taking this medication. This medication should stop regular monthly menstruation in women. Tell your care team if you continue to menstruate. Talk to your care team if you wish to become pregnant or think you might be pregnant. This medication can cause serious birth defects if taken during pregnancy or for 12 weeks after stopping treatment. Talk to your care team about reliable forms of contraception. Do not breastfeed while taking this   medication. This medication may cause infertility. Talk to your care team if you are concerned about your fertility. What side effects may I notice from receiving this medication? Side effects that you should report to your care team as soon as possible: Allergic reactions--skin rash, itching, hives, swelling  of the face, lips, tongue, or throat Change in the amount of urine Heart attack--pain or tightness in the chest, shoulders, arms, or jaw, nausea, shortness of breath, cold or clammy skin, feeling faint or lightheaded Heart rhythm changes--fast or irregular heartbeat, dizziness, feeling faint or lightheaded, chest pain, trouble breathing High blood sugar (hyperglycemia)--increased thirst or amount of urine, unusual weakness or fatigue, blurry vision High calcium level--increased thirst or amount of urine, nausea, vomiting, confusion, unusual weakness or fatigue, bone pain Pain, redness, irritation, or bruising at the injection site Severe back pain, numbness or weakness of the hands, arms, legs, or feet, loss of coordination, loss of bowel or bladder control Stroke--sudden numbness or weakness of the face, arm, or leg, trouble speaking, confusion, trouble walking, loss of balance or coordination, dizziness, severe headache, change in vision Swelling and pain of the tumor site or lymph nodes Trouble passing urine Side effects that usually do not require medical attention (report to your care team if they continue or are bothersome): Change in sex drive or performance Headache Hot flashes Rapid or extreme change in emotion or mood Sweating Swelling of the ankles, hands, or feet Unusual vaginal discharge, itching, or odor This list may not describe all possible side effects. Call your doctor for medical advice about side effects. You may report side effects to FDA at 1-800-FDA-1088. Where should I keep my medication? This medication is given in a hospital or clinic. It will not be stored at home. NOTE: This sheet is a summary. It may not cover all possible information. If you have questions about this medicine, talk to your doctor, pharmacist, or health care provider.  2023 Elsevier/Gold Standard (2007-04-15 00:00:00)  

## 2022-01-08 ENCOUNTER — Telehealth: Payer: Self-pay | Admitting: Adult Health

## 2022-01-08 NOTE — Telephone Encounter (Signed)
Rescheduled appointment per provider PAL. Left voicemail. 

## 2022-01-20 ENCOUNTER — Inpatient Hospital Stay: Payer: 59

## 2022-01-20 ENCOUNTER — Inpatient Hospital Stay: Payer: 59 | Admitting: Adult Health

## 2022-02-01 ENCOUNTER — Ambulatory Visit: Payer: 59

## 2022-02-02 ENCOUNTER — Inpatient Hospital Stay: Payer: 59 | Attending: Hematology and Oncology

## 2022-02-02 ENCOUNTER — Inpatient Hospital Stay: Payer: 59

## 2022-02-02 DIAGNOSIS — Z9221 Personal history of antineoplastic chemotherapy: Secondary | ICD-10-CM | POA: Insufficient documentation

## 2022-02-02 DIAGNOSIS — Z9013 Acquired absence of bilateral breasts and nipples: Secondary | ICD-10-CM | POA: Insufficient documentation

## 2022-02-02 DIAGNOSIS — Z923 Personal history of irradiation: Secondary | ICD-10-CM | POA: Insufficient documentation

## 2022-02-02 DIAGNOSIS — C50211 Malignant neoplasm of upper-inner quadrant of right female breast: Secondary | ICD-10-CM | POA: Insufficient documentation

## 2022-02-02 DIAGNOSIS — Z79811 Long term (current) use of aromatase inhibitors: Secondary | ICD-10-CM | POA: Insufficient documentation

## 2022-02-02 DIAGNOSIS — Z170 Estrogen receptor positive status [ER+]: Secondary | ICD-10-CM | POA: Insufficient documentation

## 2022-02-04 ENCOUNTER — Inpatient Hospital Stay (HOSPITAL_BASED_OUTPATIENT_CLINIC_OR_DEPARTMENT_OTHER): Payer: 59 | Admitting: Adult Health

## 2022-02-04 VITALS — BP 128/72 | HR 87 | Temp 97.2°F | Resp 16 | Wt 158.5 lb

## 2022-02-04 DIAGNOSIS — Z9221 Personal history of antineoplastic chemotherapy: Secondary | ICD-10-CM | POA: Diagnosis not present

## 2022-02-04 DIAGNOSIS — Z170 Estrogen receptor positive status [ER+]: Secondary | ICD-10-CM

## 2022-02-04 DIAGNOSIS — E2839 Other primary ovarian failure: Secondary | ICD-10-CM | POA: Diagnosis not present

## 2022-02-04 DIAGNOSIS — Z79811 Long term (current) use of aromatase inhibitors: Secondary | ICD-10-CM | POA: Diagnosis not present

## 2022-02-04 DIAGNOSIS — C50211 Malignant neoplasm of upper-inner quadrant of right female breast: Secondary | ICD-10-CM

## 2022-02-04 DIAGNOSIS — Z9013 Acquired absence of bilateral breasts and nipples: Secondary | ICD-10-CM | POA: Diagnosis not present

## 2022-02-04 DIAGNOSIS — Z923 Personal history of irradiation: Secondary | ICD-10-CM | POA: Diagnosis not present

## 2022-02-04 NOTE — Progress Notes (Signed)
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 N3, stage IIIA invasive ductal carcinoma, grade 1, triple positive  (a) breast MRI 07/06/2020 finds a 1.6 cm lower inner quadrant mass associated with 5.4 cm of non-mass-like enhancement, at least 8 enlarged right axillary lymph node and 1 right internal mammary lymph node  (b) CT scans of the neck and chest 06/24/2020 shows subcentimeter right cervical adenopathy, 2 right supraclavicular lymph nodes the larger 0.65 cm, also left cervical adenopathy largest measuring 1.02 cm; there is also a 1.2 cm lesion in the left hepatic lobe, and a sclerotic lesion in the mid thoracic spine at T7  (c) PET scan 07/21/2020 shows regional adenopathy (including 2 right subcentimeter supraclavicular lymph nodes and subcentimeter lymph nodes in the right hilar, subcarinal and lateral aortic region), no uptake in liver, and an indeterminate T12 lesion  (d) thoracic spine MRI 07/27/2020 shows the T12 lesion to be likely benign, there is also degenerative disc disease   07/23/2020 - 12/18/2020 Neo-Adjuvant Chemotherapy   neoadjuvant chemotherapy will consist of carboplatin, docetaxel, trastuzumab, and pertuzumab q. 21 days x 6 07/23/2020-11/06/2020 --two doses of Trastuzumab given on 9/22 and 10/13   01/07/2021 Cancer Staging   Staging form: Breast, AJCC 8th Edition - Pathologic stage from 01/07/2021: No Stage Recommended (ypT2, pN34m, cM0, G2) - Signed by CGardenia Phlegm NP on 01/15/2021 Stage prefix: Post-therapy Histologic grading system: 3 grade system   01/07/2021 Surgery   She underwent bilateral mastectomy on 01/07/2021 that showed: left breast no evidence of malignancy, and the right breast invasive ductal carcinoma grade 2, spanning fibrotic area of approximately 5 cm in patches.  Margins  were negative.   (A) Microscopic metastatic carcinoma was noted in 1 out of 15 lymph nodes biopsied.   01/26/2021 - 10/14/2021 Chemotherapy   Patient is on Treatment Plan : BREAST ADO-Trastuzumab Emtansine (Kadcyla) q21d     02/13/2021 - 04/01/2021 Radiation Therapy   Site Technique Total Dose (Gy) Dose per Fx (Gy) Completed Fx Beam Energies  Chest Wall, Right: CW_R 3D 50.4/50.4 1.8 28/28 6XFFF, 10X  Chest Wall, Right: CW_R_SCLV 3D 50.4/50.4 1.8 28/28 6X, 10X  Chest Wall, Right: CW_R_Bst Electron 10/10 2 5/5 6E     04/08/2021 -  Anti-estrogen oral therapy   Antiestrogen therapy with Zolodex and AI starting 04/21/21     INTERVAL HISTORY:  Ms. ASiglinto review her survivorship care plan detailing her treatment course for breast cancer, as well as monitoring long-term side effects of that treatment, education regarding health maintenance, screening, and overall wellness and health promotion.     Overall, Ms. AGeracireports feeling quite well.  She is taking anastrozole daily along with Zoladex every 4 weeks.  She is experiencing hot flashes.  She has some achiness, but remaining active makes it better.   REVIEW OF SYSTEMS:  Review of Systems  Constitutional:  Negative for appetite change, chills, fatigue, fever and unexpected weight change.  HENT:   Negative for hearing loss, lump/mass and trouble swallowing.   Eyes:  Negative for eye problems and icterus.  Respiratory:  Negative for chest tightness, cough and shortness of breath.   Cardiovascular:  Negative for chest pain, leg swelling and palpitations.  Gastrointestinal:  Negative for abdominal distention, abdominal pain, constipation, diarrhea, nausea and vomiting.  Endocrine: Positive for hot flashes.  Genitourinary:  Negative for  difficulty urinating.   Musculoskeletal:  Positive for arthralgias.  Skin:  Negative for itching and rash.  Neurological:  Negative for dizziness, extremity weakness, headaches and numbness.  Hematological:   Negative for adenopathy. Does not bruise/bleed easily.  Psychiatric/Behavioral:  Negative for depression. The patient is not nervous/anxious.       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)    Hx of varicella    Kidney stone 2012   Medullary thyroid carcinoma ([place])    Monoallelic mutation of CHEK2 gene in female patient    Postpartum care following vaginal delivery (7/27) 10/02/2014, June 16 , 2020   Second degree perineal laceration 08/23/2018   SVD (spontaneous vaginal delivery) 08/23/2018   Thyroid cancer ([place]) 2021   Past Surgical History:  Procedure Laterality Date   IR IMAGING GUIDED PORT INSERTION  07/22/2020   IR REMOVAL TUN ACCESS W/ PORT W/O FL MOD SED  11/12/2021   MODIFIED MASTECTOMY Right 01/07/2021   Procedure: RIGHT MODIFIED RADICAL MASTECTOMY;  Surgeon: Jovita Kussmaul, MD;  Location: [facility];  Service: General;  Laterality: Right;   NO PAST SURGERIES     SIMPLE MASTECTOMY WITH AXILLARY SENTINEL NODE BIOPSY Left 01/07/2021   Procedure: LEFT SIMPLE MASTECTOMY;  Surgeon: Jovita Kussmaul, MD;  Location: [facility];  Service: General;  Laterality: Left;   THYROIDECTOMY N/A 04/12/2019   Procedure: TOTAL THYROIDECTOMY WITH LIMITED LYMPH NODE DISSECTION;  Surgeon: Armandina Gemma, MD;  Location: [facility];  Service: General;  Laterality: N/A;   UNILATERAL SALPINGECTOMY Right 07/09/2017   Procedure: RIGHT SALPINGECTOMY WITH REMOVAL OF ECTOPIC PREGNANCY;  Surgeon: Azucena Fallen, MD;  Location: [facility];  Service: Gynecology;  Laterality: Right;     ALLERGIES:  No Known Allergies   CURRENT MEDICATIONS:  Outpatient  Encounter Medications as of 02/04/2022  Medication Sig Note   acetaminophen (TYLENOL) 325 MG tablet Take 2 tablets (650 mg total) by mouth every 4 (four) hours as needed (for pain scale < 4).    anastrozole (ARIMIDEX) 1 MG tablet Take 1 tablet (1 mg total) by mouth daily.    calcitRIOL (ROCALTROL) 0.5 MCG capsule Take 1 capsule (0.5 mcg total) by mouth 2 (two) times a week. 07/31/2020: No specific days   levonorgestrel (MIRENA) 20 MCG/24HR IUD 1 each by Intrauterine route once.    levothyroxine (SYNTHROID) 175 MCG tablet Take 175 mcg by mouth daily before breakfast.    liothyronine (CYTOMEL) 5 MCG tablet Take 5 mcg by mouth daily.    LORazepam (ATIVAN) 0.5 MG tablet Take 1 tablet (0.5 mg total) by mouth every 12 (twelve) hours as needed for anxiety.    sertraline (ZOLOFT) 50 MG tablet Take 50 mg by mouth at bedtime.    VYVANSE 60 MG capsule Take 60 mg by mouth every morning. 07/31/2020: Hasn't started   calcium carbonate (OS-CAL) 600 MG TABS tablet Take 1 tablet by mouth daily.    [DISCONTINUED] loratadine (CLARITIN) 10 MG tablet Take 1 tablet (10 mg total) by mouth daily.    [DISCONTINUED] methocarbamol (ROBAXIN) 500 MG tablet Take 1 tablet (  500 mg total) by mouth every 6 (six) hours as needed for muscle spasms.    [DISCONTINUED] prochlorperazine (COMPAZINE) 10 MG tablet Take 1 tablet (10 mg total) by mouth every 6 (six) hours as needed (Nausea or vomiting).    Facility-Administered Encounter Medications as of 02/04/2022  Medication   sodium chloride flush (NS) 0.9 % injection 10 mL     ONCOLOGIC FAMILY HISTORY:  Family History  Problem Relation Age of Onset   Breast cancer Mother 36       2 types of cancer one side mastectomy with chemo   Endometriosis Mother    Endometriosis Maternal Grandmother    Breast cancer Maternal Grandmother        post menopausal   Diabetes Maternal Grandmother    Cancer Maternal Grandfather        prostate and lymphoma   Alzheimer's disease Paternal  Grandmother    Cancer Father 71       prostate cancer   Aneurysm Paternal Grandfather        d. 51s     SOCIAL HISTORY:  Social History   Socioeconomic History   Marital status: Married    Spouse name: Not on file   Number of children: Not on file   Years of education: Not on file   Highest education level: Not on file  Occupational History   Occupation: homemaker  Tobacco Use   Smoking status: Some Days    Packs/day: 0.50    Years: 5.00    Total pack years: 2.50    Types: Cigarettes   Smokeless tobacco: Never   Tobacco comments:    Actively trying to quit.  Vaping Use   Vaping Use: Never used  Substance and Sexual Activity   Alcohol use: Yes    Alcohol/week: 7.0 - 14.0 standard drinks of alcohol    Types: 7 - 14 Glasses of wine per week    Comment: glass of wine each night    Drug use: Not Currently    Types: Marijuana    Comment: not during pregnancy, last use was 03/27/2019   Sexual activity: Yes    Birth control/protection: None, I.U.D.  Other Topics Concern   Not on file  Social History Narrative   Not on file   Social Determinants of Health   Financial Resource Strain: Low Risk  (07/10/2020)   Overall Financial Resource Strain (CARDIA)    Difficulty of Paying Living Expenses: Not hard at all  Food Insecurity: No Food Insecurity (07/10/2020)   Hunger Vital Sign    Worried About Running Out of Food in the Last Year: Never true    Ran Out of Food in the Last Year: Never true  Transportation Needs: No Transportation Needs (07/10/2020)   PRAPARE - Hydrologist (Medical): No    Lack of Transportation (Non-Medical): No  Physical Activity: Not on file  Stress: Stress Concern Present (08/11/2018)   [place]    Feeling of Stress : To some extent  Social Connections: Not on file  Intimate Partner Violence: Not At Risk (08/11/2018)   Humiliation, Afraid, Rape, and Kick  questionnaire    Fear of Current or Ex-Partner: No    Emotionally Abused: No    Physically Abused: No    Sexually Abused: No     OBSERVATIONS/OBJECTIVE:  BP 128/72   Pulse 87   Temp (!) 97.2 F (36.2 C) (Temporal)   Resp 16  Wt 158 lb 8 oz (71.9 kg)   SpO2 100%   BMI 25.58 kg/m  GENERAL: Patient is a well appearing female in no acute distress HEENT:  Sclerae anicteric.  Oropharynx clear and moist. No ulcerations or evidence of oropharyngeal candidiasis. Neck is supple.  NODES:  No cervical, supraclavicular, or axillary lymphadenopathy palpated.  BREAST EXAM:  s/p bilateral mastectomies, no sign of recurrence noted LUNGS:  Clear to auscultation bilaterally.  No wheezes or rhonchi. HEART:  Regular rate and rhythm. No murmur appreciated. ABDOMEN:  Soft, nontender.  Positive, normoactive bowel sounds. No organomegaly palpated. MSK:  No focal spinal tenderness to palpation. Full range of motion bilaterally in the upper extremities. EXTREMITIES:  No peripheral edema.   SKIN:  Clear with no obvious rashes or skin changes. No nail dyscrasia. NEURO:  Nonfocal. Well oriented.  Appropriate affect.  LABORATORY DATA:  None for this visit.  DIAGNOSTIC IMAGING:  None for this visit.      ASSESSMENT AND PLAN:  Ms.. Little is a pleasant 36 y.o. 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  She presents to the Survivorship Clinic for our initial meeting and routine follow-up post-completion of treatment for breast cancer.    1. Stage IIIA right breast cancer:  Ms. Simmon is continuing to recover from definitive treatment for breast cancer. She will follow-up with her medical oncologist, Dr. Lindi Adie in approximately 12 weeks with history and physical exam per surveillance protocol.  She  will continue her anti-estrogen therapy with Zoladex and Anastrozole which she is tolerating moderately well.  We discussed her undergoing oophorectomy and I wrote a letter to Dr. Ronita Hipps requesting his consultation for this so she can forego future Zoladex injections. Today, a comprehensive survivorship care plan and treatment summary was reviewed with the patient today detailing her breast cancer diagnosis, treatment course, potential late/long-term effects of treatment, appropriate follow-up care with recommendations for the future, and patient education resources.  A copy of this summary, along with a letter will be sent to the patient's primary care provider via mail/fax/In Basket message after today's visit.    2. Bone health:  Given Ms. Outland's age/history of breast cancer and her current treatment regimen including anti-estrogen therapy with Anastrozole and Zoladex, she is at risk for bone demineralization.  She has not undergone bone density testing and I placed orders for this to occur in the next few weeks.  She was given education on specific activities to promote bone health.  3. Cancer screening:  Due to Ms. Alpert's history and her age, she should receive screening for skin cancers, colon cancer, and gynecologic cancers.  Due to her MEN2 mutation she is recommended to undergo colon cancer screening in 2 years beginning at age 13.  The information and recommendations are listed on the patient's comprehensive care plan/treatment summary and were reviewed in detail with the patient.    4. Health maintenance and wellness promotion: Ms. Stadel was encouraged to consume 5-7 servings of fruits and vegetables per day. We reviewed the "Nutrition Rainbow" handout.  She was also encouraged to engage in moderate to vigorous exercise for 30 minutes per day most days of the week. We discussed the [employer] fitness program, which is designed for cancer survivors to help them become more physically fit after  cancer treatments.  She was instructed to limit her alcohol consumption and continue  to abstain from tobacco use.     5. Support services/counseling: It is not uncommon for this period of the patient's cancer care trajectory to be one of many emotions and stressors.   She was given information regarding our available services and encouraged to contact me with any questions or for help enrolling in any of our support group/programs.    Follow up instructions:    -Return to cancer center every 4 weeks for Zoladex, see Dr. Lindi Adie in 12 weeks  -Bone density testing ordered -She is welcome to return back to the Survivorship Clinic at any time; no additional follow-up needed at this time.  -Consider referral back to survivorship as a long-term survivor for continued surveillance  The patient was provided an opportunity to ask questions and all were answered. The patient agreed with the plan and demonstrated an understanding of the instructions.   Total encounter time:45 minutes*in face-to-face visit time, chart review, lab review, care coordination, order entry, and documentation of the encounter time.    Wilber Bihari, NP 02/08/22 8:50 AM Medical Oncology and Hematology [facility] [address], [place] [address] Tel. [phone]    Fax. [phone]  *Total Encounter Time as defined by the Centers for Medicare and Medicaid Services includes, in addition to the face-to-face time of a patient visit (documented in the note above) non-face-to-face time: obtaining and reviewing outside history, ordering and reviewing medications, tests or procedures, care coordination (communications with other health care professionals or caregivers) and documentation in the medical record.

## 2022-02-08 ENCOUNTER — Encounter: Payer: Self-pay | Admitting: Adult Health

## 2022-02-08 ENCOUNTER — Encounter: Payer: Self-pay | Admitting: Hematology and Oncology

## 2022-02-17 ENCOUNTER — Inpatient Hospital Stay: Payer: 59

## 2022-02-22 ENCOUNTER — Ambulatory Visit: Payer: 59 | Attending: Hematology and Oncology

## 2022-02-22 DIAGNOSIS — M79621 Pain in right upper arm: Secondary | ICD-10-CM | POA: Insufficient documentation

## 2022-02-22 DIAGNOSIS — C50211 Malignant neoplasm of upper-inner quadrant of right female breast: Secondary | ICD-10-CM | POA: Insufficient documentation

## 2022-02-22 DIAGNOSIS — Z170 Estrogen receptor positive status [ER+]: Secondary | ICD-10-CM | POA: Insufficient documentation

## 2022-02-22 DIAGNOSIS — Z483 Aftercare following surgery for neoplasm: Secondary | ICD-10-CM | POA: Insufficient documentation

## 2022-02-22 DIAGNOSIS — C50311 Malignant neoplasm of lower-inner quadrant of right female breast: Secondary | ICD-10-CM | POA: Insufficient documentation

## 2022-03-02 ENCOUNTER — Inpatient Hospital Stay: Payer: 59

## 2022-03-02 ENCOUNTER — Ambulatory Visit: Payer: 59

## 2022-03-07 LAB — SIGNATERA
SIGNATERA MTM READOUT: 0 MTM/ml
SIGNATERA TEST RESULT: NEGATIVE

## 2022-03-15 ENCOUNTER — Other Ambulatory Visit (HOSPITAL_BASED_OUTPATIENT_CLINIC_OR_DEPARTMENT_OTHER): Payer: 59

## 2022-03-23 ENCOUNTER — Other Ambulatory Visit (HOSPITAL_BASED_OUTPATIENT_CLINIC_OR_DEPARTMENT_OTHER): Payer: 59

## 2022-03-30 ENCOUNTER — Encounter (HOSPITAL_BASED_OUTPATIENT_CLINIC_OR_DEPARTMENT_OTHER): Payer: Self-pay

## 2022-03-30 ENCOUNTER — Ambulatory Visit (HOSPITAL_BASED_OUTPATIENT_CLINIC_OR_DEPARTMENT_OTHER): Admit: 2022-03-30 | Payer: 59 | Admitting: Obstetrics and Gynecology

## 2022-03-30 SURGERY — SALPINGO-OOPHORECTOMY, ROBOT-ASSISTED
Anesthesia: General | Laterality: Bilateral

## 2022-04-16 ENCOUNTER — Telehealth: Payer: Self-pay

## 2022-04-16 NOTE — Telephone Encounter (Signed)
Attempted to call pt regarding signatera results lvm for pt to return call back.   

## 2022-05-24 LAB — SIGNATERA ONLY (NATERA MANAGED)
SIGNATERA MTM READOUT: 0 MTM/ml
SIGNATERA TEST RESULT: NEGATIVE

## 2022-05-25 ENCOUNTER — Telehealth: Payer: Self-pay

## 2022-05-25 ENCOUNTER — Encounter: Payer: Self-pay | Admitting: Adult Health

## 2022-05-25 NOTE — Telephone Encounter (Signed)
S/w pt via phone regarding MyChart message about her missed appts. She will call [facility] to schedule her bone density. Mendel Ryder will see her one week after with planned injection appt to restart Zoladex.

## 2022-06-10 ENCOUNTER — Encounter (HOSPITAL_COMMUNITY): Payer: Self-pay

## 2023-01-26 ENCOUNTER — Encounter: Payer: Self-pay | Admitting: Hematology and Oncology

## 2023-01-26 NOTE — Telephone Encounter (Signed)
Telephone call  

## 2023-08-11 ENCOUNTER — Emergency Department (HOSPITAL_COMMUNITY)

## 2023-08-11 ENCOUNTER — Inpatient Hospital Stay (HOSPITAL_COMMUNITY)
Admission: EM | Admit: 2023-08-11 | Discharge: 2023-08-12 | DRG: 054 | Disposition: A | Discharge status: Home / Self Care | Attending: Internal Medicine | Admitting: Internal Medicine

## 2023-08-11 ENCOUNTER — Inpatient Hospital Stay (HOSPITAL_COMMUNITY)

## 2023-08-11 DIAGNOSIS — Z8041 Family history of malignant neoplasm of ovary: Secondary | ICD-10-CM | POA: Diagnosis not present

## 2023-08-11 DIAGNOSIS — C50919 Malignant neoplasm of unspecified site of unspecified female breast: Secondary | ICD-10-CM | POA: Diagnosis not present

## 2023-08-11 DIAGNOSIS — Z9221 Personal history of antineoplastic chemotherapy: Secondary | ICD-10-CM

## 2023-08-11 DIAGNOSIS — F32A Depression, unspecified: Secondary | ICD-10-CM | POA: Diagnosis present

## 2023-08-11 DIAGNOSIS — Z923 Personal history of irradiation: Secondary | ICD-10-CM | POA: Diagnosis not present

## 2023-08-11 DIAGNOSIS — C7931 Secondary malignant neoplasm of brain: Secondary | ICD-10-CM | POA: Diagnosis present

## 2023-08-11 DIAGNOSIS — Z807 Family history of other malignant neoplasms of lymphoid, hematopoietic and related tissues: Secondary | ICD-10-CM

## 2023-08-11 DIAGNOSIS — C50911 Malignant neoplasm of unspecified site of right female breast: Secondary | ICD-10-CM | POA: Diagnosis present

## 2023-08-11 DIAGNOSIS — R4701 Aphasia: Secondary | ICD-10-CM | POA: Diagnosis present

## 2023-08-11 DIAGNOSIS — Z87442 Personal history of urinary calculi: Secondary | ICD-10-CM

## 2023-08-11 DIAGNOSIS — G936 Cerebral edema: Secondary | ICD-10-CM | POA: Diagnosis present

## 2023-08-11 DIAGNOSIS — Z820 Family history of epilepsy and other diseases of the nervous system: Secondary | ICD-10-CM | POA: Diagnosis not present

## 2023-08-11 DIAGNOSIS — G9349 Other encephalopathy: Secondary | ICD-10-CM | POA: Diagnosis present

## 2023-08-11 DIAGNOSIS — F17210 Nicotine dependence, cigarettes, uncomplicated: Secondary | ICD-10-CM | POA: Diagnosis present

## 2023-08-11 DIAGNOSIS — Z79890 Hormone replacement therapy (postmenopausal): Secondary | ICD-10-CM

## 2023-08-11 DIAGNOSIS — Z8042 Family history of malignant neoplasm of prostate: Secondary | ICD-10-CM

## 2023-08-11 DIAGNOSIS — Z79899 Other long term (current) drug therapy: Secondary | ICD-10-CM

## 2023-08-11 DIAGNOSIS — G939 Disorder of brain, unspecified: Secondary | ICD-10-CM

## 2023-08-11 DIAGNOSIS — Z85850 Personal history of malignant neoplasm of thyroid: Secondary | ICD-10-CM

## 2023-08-11 DIAGNOSIS — Z803 Family history of malignant neoplasm of breast: Secondary | ICD-10-CM | POA: Diagnosis not present

## 2023-08-11 DIAGNOSIS — Z833 Family history of diabetes mellitus: Secondary | ICD-10-CM | POA: Diagnosis not present

## 2023-08-11 DIAGNOSIS — R569 Unspecified convulsions: Secondary | ICD-10-CM | POA: Diagnosis present

## 2023-08-11 DIAGNOSIS — E890 Postprocedural hypothyroidism: Secondary | ICD-10-CM | POA: Diagnosis present

## 2023-08-11 DIAGNOSIS — Z148 Genetic carrier of other disease: Secondary | ICD-10-CM | POA: Diagnosis not present

## 2023-08-11 DIAGNOSIS — K639 Disease of intestine, unspecified: Secondary | ICD-10-CM

## 2023-08-11 DIAGNOSIS — Z853 Personal history of malignant neoplasm of breast: Secondary | ICD-10-CM | POA: Diagnosis not present

## 2023-08-11 DIAGNOSIS — F988 Other specified behavioral and emotional disorders with onset usually occurring in childhood and adolescence: Secondary | ICD-10-CM | POA: Diagnosis present

## 2023-08-11 DIAGNOSIS — Z9013 Acquired absence of bilateral breasts and nipples: Secondary | ICD-10-CM

## 2023-08-11 LAB — CBC
HCT: 41.1 % (ref 36.0–46.0)
Hemoglobin: 13.7 g/dL (ref 12.0–15.0)
MCH: 33.7 pg (ref 26.0–34.0)
MCHC: 33.3 g/dL (ref 30.0–36.0)
MCV: 101.2 fL — ABNORMAL HIGH (ref 80.0–100.0)
Platelets: 327 10*3/uL (ref 150–400)
RBC: 4.06 MIL/uL (ref 3.87–5.11)
RDW: 12.6 % (ref 11.5–15.5)
WBC: 10.2 10*3/uL (ref 4.0–10.5)
nRBC: 0 % (ref 0.0–0.2)

## 2023-08-11 LAB — HCG, SERUM, QUALITATIVE: Preg, Serum: NEGATIVE

## 2023-08-11 LAB — COMPREHENSIVE METABOLIC PANEL WITH GFR
ALT: 24 U/L (ref 0–44)
AST: 29 U/L (ref 15–41)
Albumin: 3.9 g/dL (ref 3.5–5.0)
Alkaline Phosphatase: 45 U/L (ref 38–126)
Anion gap: 11 (ref 5–15)
BUN: 10 mg/dL (ref 6–20)
CO2: 20 mmol/L — ABNORMAL LOW (ref 22–32)
Calcium: 8.6 mg/dL — ABNORMAL LOW (ref 8.9–10.3)
Chloride: 103 mmol/L (ref 98–111)
Creatinine, Ser: 1.04 mg/dL — ABNORMAL HIGH (ref 0.44–1.00)
GFR, Estimated: >60 mL/min (ref >60)
Glucose, Bld: 105 mg/dL — ABNORMAL HIGH (ref 70–99)
Potassium: 4.2 mmol/L (ref 3.5–5.1)
Sodium: 134 mmol/L — ABNORMAL LOW (ref 135–145)
Total Bilirubin: 0.7 mg/dL (ref 0.0–1.2)
Total Protein: 6.9 g/dL (ref 6.5–8.1)

## 2023-08-11 LAB — DIFFERENTIAL
Abs Immature Granulocytes: 0.04 10*3/uL (ref 0.00–0.07)
Basophils Absolute: 0.1 10*3/uL (ref 0.0–0.1)
Basophils Relative: 1 %
Eosinophils Absolute: 0.1 10*3/uL (ref 0.0–0.5)
Eosinophils Relative: 1 %
Immature Granulocytes: 0 %
Lymphocytes Relative: 24 %
Lymphs Abs: 2.4 10*3/uL (ref 0.7–4.0)
Monocytes Absolute: 0.9 10*3/uL (ref 0.1–1.0)
Monocytes Relative: 9 %
Neutro Abs: 6.6 10*3/uL (ref 1.7–7.7)
Neutrophils Relative %: 65 %

## 2023-08-11 LAB — I-STAT CHEM 8, ED
BUN: 10 mg/dL (ref 6–20)
Calcium, Ion: 1.01 mmol/L — ABNORMAL LOW (ref 1.15–1.40)
Chloride: 103 mmol/L (ref 98–111)
Creatinine, Ser: 0.9 mg/dL (ref 0.44–1.00)
Glucose, Bld: 105 mg/dL — ABNORMAL HIGH (ref 70–99)
HCT: 42 % (ref 36.0–46.0)
Hemoglobin: 14.3 g/dL (ref 12.0–15.0)
Potassium: 4.3 mmol/L (ref 3.5–5.1)
Sodium: 136 mmol/L (ref 135–145)
TCO2: 21 mmol/L — ABNORMAL LOW (ref 22–32)

## 2023-08-11 LAB — CBG MONITORING, ED: Glucose-Capillary: 111 mg/dL — ABNORMAL HIGH (ref 70–99)

## 2023-08-11 LAB — PROTIME-INR
INR: 0.9 (ref 0.8–1.2)
Prothrombin Time: 12.5 s (ref 11.4–15.2)

## 2023-08-11 LAB — ETHANOL: Alcohol, Ethyl (B): <15 mg/dL (ref <15)

## 2023-08-11 LAB — APTT: aPTT: 31 s (ref 24–36)

## 2023-08-11 MED ORDER — LORAZEPAM 2 MG/ML IJ SOLN: 2.0000 mg | Freq: Once | INTRAMUSCULAR | Status: AC

## 2023-08-11 MED ORDER — LORAZEPAM 2 MG/ML IJ SOLN
INTRAMUSCULAR | Status: AC
  Administered 2023-08-11: 2 mg via INTRAVENOUS
  Filled 2023-08-11: qty 1

## 2023-08-11 MED ORDER — GADOBUTROL 1 MMOL/ML IV SOLN
7.5000 mL | Freq: Once | INTRAVENOUS | Status: AC | PRN
  Administered 2023-08-11: 7.5 mL via INTRAVENOUS

## 2023-08-11 MED ORDER — DEXAMETHASONE SODIUM PHOSPHATE 10 MG/ML IJ SOLN
10.0000 mg | Freq: Once | INTRAMUSCULAR | Status: AC
  Administered 2023-08-11: 10 mg via INTRAVENOUS
  Filled 2023-08-11: qty 1

## 2023-08-11 MED ORDER — SERTRALINE HCL 50 MG PO TABS
50.0000 mg | ORAL_TABLET | Freq: Every day | ORAL | Status: DC
  Administered 2023-08-11: 50 mg via ORAL
  Filled 2023-08-11: qty 1

## 2023-08-11 MED ORDER — DEXAMETHASONE SODIUM PHOSPHATE 4 MG/ML IJ SOLN
4.0000 mg | Freq: Two times a day (BID) | INTRAMUSCULAR | Status: DC
  Administered 2023-08-11 – 2023-08-12 (×2): 4 mg via INTRAVENOUS
  Filled 2023-08-11 (×2): qty 1

## 2023-08-11 MED ORDER — LORAZEPAM 0.5 MG PO TABS
0.5000 mg | ORAL_TABLET | Freq: Two times a day (BID) | ORAL | Status: DC | PRN
  Administered 2023-08-11: 0.5 mg via ORAL
  Filled 2023-08-11: qty 1

## 2023-08-11 MED ORDER — LEVETIRACETAM (KEPPRA) 500 MG/5 ML ADULT IV PUSH: 4500.0000 mg | Freq: Once | INTRAVENOUS | Status: AC

## 2023-08-11 MED ORDER — LEVETIRACETAM (KEPPRA) 500 MG/5 ML ADULT IV PUSH
1000.0000 mg | Freq: Two times a day (BID) | INTRAVENOUS | Status: DC
  Administered 2023-08-11: 1000 mg via INTRAVENOUS
  Filled 2023-08-11 (×2): qty 10

## 2023-08-11 MED ORDER — LEVOTHYROXINE SODIUM 75 MCG PO TABS
175.0000 ug | ORAL_TABLET | Freq: Every day | ORAL | Status: DC
  Administered 2023-08-12: 175 ug via ORAL
  Filled 2023-08-11: qty 1

## 2023-08-11 MED ORDER — LISDEXAMFETAMINE DIMESYLATE 30 MG PO CAPS
60.0000 mg | ORAL_CAPSULE | Freq: Every morning | ORAL | Status: DC
  Administered 2023-08-12: 60 mg via ORAL
  Filled 2023-08-11: qty 2

## 2023-08-11 MED ORDER — LEVETIRACETAM 500 MG/5ML IV SOLN
INTRAVENOUS | Status: AC
  Administered 2023-08-11: 4500 mg via INTRAVENOUS
  Filled 2023-08-11: qty 45

## 2023-08-11 MED ORDER — LORAZEPAM 2 MG/ML IJ SOLN
INTRAMUSCULAR | Status: AC
  Filled 2023-08-11: qty 1

## 2023-08-11 MED ORDER — LORAZEPAM 2 MG/ML IJ SOLN
1.0000 mg | Freq: Once | INTRAMUSCULAR | Status: AC
  Administered 2023-08-11: 1 mg via INTRAVENOUS

## 2023-08-11 NOTE — Code Documentation (Signed)
 Stroke Response Nurse Documentation Code Documentation  Kayla Little is a 38 y.o. female arriving to [facility]  via [facility] on 08/11/2023 with past medical hx of breast cancer, ADHD. On No antithrombotic. Code stroke was activated by EMS.   Patient from home where she was LKW at [phone] and now complaining of Aphasia.   Stroke team at the bedside on patient arrival. Labs drawn and patient cleared for CT by Dr. Synetta Eves. Patient to CT with team. NIHSS 1, see documentation for details and code stroke times. Patient with left decreased sensation on exam. After CT scan, aphasia returned. NIHSS at that time 4. The following imaging was completed:  CT Head and MRI. Patient is not a candidate for IV Thrombolytic due to stroke not suspected. Patient is not a candidate for IR due to stroke not suspected. Code stroke cancelled at 1203.      Bedside handoff with ED Paramedic Thayne Fine  Stroke Response RN

## 2023-08-11 NOTE — ED Notes (Signed)
 EEG at bedside.

## 2023-08-11 NOTE — ED Triage Notes (Signed)
 Pt bib ems from home as code stroke; LKW 0930 this am; pt reports at 0934 had onset of L sided sensory deficits (more so in L face), and aphasia; pt endorses improvement in aphasia but reports she is still having sensory deficits; pt a and o x 4; no unilateral weakness; cbg 142, BP 170/76, p 80, 100% RA

## 2023-08-11 NOTE — Consult Note (Addendum)
 NEUROLOGY CONSULT NOTE   Date of service: August 11, 2023 Patient Name: Kayla Little MRN:  [id number] DOB:  12/27/1985 Chief Complaint: "code stroke" Requesting Provider: No att. providers found  History of Present Illness  PHIL CORTI is a 38 y.o. female with hx of breast cancer, thyroid  cancer s/p thyroidectomy ADD, ocular migraines presented from home via EMS. Code stroke activated  by EMS. NIHSS 1 LKW 0934 She was attempting to make plans for her upcoming trip and was talking with someone and texting/writing things down  when she acutely developed trouble speaking and getting her words out, she was not making sense nor was her writing, symptoms lasted about 1 hour and completely resolved. She states she has never had this happen before.  On arrival to the ED she was able to communicate and had no difficulty with speech, however after Ct head aphasia returned and she was unable to tell us  history or name objects  She was given 2mg  IV ativan  and 4500mg  IV Keppra, aphasia resolved. Aphasia returned and was given another 1 mg ativan  and aphasia resolved again  CT head with  Lesions within the right parietal lobe (16 mm) and at the left temporoparietal junction (15 mm) with mild surrounding edema.  LKW: 0930 Modified rankin score: 0-Completely asymptomatic and back to baseline post- stroke IV Thrombolysis: No low NIHSS to mild to treat, brain lesions identified on CT c/f mets EVT:  No LVO    NIHSS components Score: Comment  1a Level of Conscious 0[x]  1[]  2[]  3[]      1b LOC Questions 0[x]  1[]  2[]       1c LOC Commands 0[x]  1[]  2[]       2 Best Gaze 0[x]  1[]  2[]       3 Visual 0[x]  1[]  2[]  3[]      4 Facial Palsy 0[x]  1[]  2[]  3[]      5a Motor Arm - left 0[x]  1[]  2[]  3[]  4[]  UN[]    5b Motor Arm - Right 0[x]  1[]  2[]  3[]  4[]  UN[]    6a Motor Leg - Left 0[x]  1[]  2[]  3[]  4[]  UN[]    6b Motor Leg - Right 0[x]  1[]  2[]  3[]  4[]  UN[]    7 Limb Ataxia 0[x]  1[]  2[]  UN[]      8 Sensory 0[]  1[x]  2[]  UN[]       9 Best Language 0[x]  1[]  2[]  3[]      10 Dysarthria 0[x]  1[]  2[]  UN[]      11 Extinct. 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)    Hx of varicella    Kidney stone 2012   Medullary thyroid  carcinoma (HCC)    Monoallelic mutation of CHEK2 gene in female patient    Postpartum care following vaginal delivery (7/27) 10/02/2014, June 16 , 2020   Second degree perineal laceration 08/23/2018   SVD (spontaneous vaginal delivery) 08/23/2018   Thyroid  cancer (HCC) 2021    Past Surgical History:  Procedure Laterality Date   IR IMAGING GUIDED PORT INSERTION  07/22/2020   IR REMOVAL TUN ACCESS W/ PORT W/O FL MOD SED  11/12/2021   MODIFIED MASTECTOMY Right 01/07/2021   Procedure: RIGHT MODIFIED RADICAL MASTECTOMY;  Surgeon: Caralyn Chandler, MD;  Location: [facility];  Service: General;  Laterality: Right;   NO PAST SURGERIES     SIMPLE MASTECTOMY WITH AXILLARY SENTINEL NODE BIOPSY Left 01/07/2021   Procedure: LEFT SIMPLE MASTECTOMY;  Surgeon: Caralyn Chandler, MD;  Location: [facility];  Service: General;  Laterality: Left;   THYROIDECTOMY N/A 04/12/2019   Procedure: TOTAL THYROIDECTOMY WITH LIMITED LYMPH NODE DISSECTION;  Surgeon: Oralee Billow, MD;  Location: [facility];  Service: General;  Laterality: N/A;   UNILATERAL SALPINGECTOMY Right 07/09/2017   Procedure: RIGHT SALPINGECTOMY WITH REMOVAL OF ECTOPIC PREGNANCY;  Surgeon: Terri Fester, MD;  Location: [facility];  Service: Gynecology;  Laterality: Right;    Family  History: Family History  Problem Relation Age of Onset   Breast cancer Mother 110       2 types of cancer one side mastectomy with chemo   Endometriosis Mother    Endometriosis Maternal Grandmother    Breast cancer Maternal Grandmother        post menopausal   Diabetes Maternal Grandmother    Cancer Maternal Grandfather        prostate and lymphoma   Alzheimer's disease Paternal Grandmother    Cancer Father 19       prostate cancer   Aneurysm Paternal Grandfather        d. 30s    Social History  reports that she has been smoking cigarettes. She has a 2.5 pack-year smoking history. She has never used smokeless tobacco. She reports current alcohol use of about 7.0 - 14.0 standard drinks of alcohol per week. She reports that she does not currently use drugs after having used the following drugs: Marijuana.  No Known Allergies  Medications  No current facility-administered medications for this encounter.  Current Outpatient Medications:    acetaminophen  (TYLENOL ) 325 MG tablet, Take 2 tablets (650 mg total) by mouth every 4 (four) hours as needed (for pain scale < 4)., Disp: 100 tablet, Rfl: 0   anastrozole  (ARIMIDEX ) 1 MG tablet, Take 1 tablet (1 mg total) by mouth daily., Disp: 90 tablet, Rfl: 3   calcitRIOL  (ROCALTROL ) 0.5 MCG capsule, Take 1 capsule (0.5 mcg total) by mouth 2 (two) times a week., Disp: 30 capsule, Rfl: 1   levonorgestrel (MIRENA) 20 MCG/24HR IUD, 1 each by Intrauterine route once., Disp: , Rfl:    levothyroxine  (SYNTHROID ) 175 MCG tablet, Take 175 mcg by mouth daily before breakfast., Disp: , Rfl:    liothyronine  (CYTOMEL ) 5 MCG tablet, Take 5 mcg by mouth daily., Disp: , Rfl:    LORazepam  (ATIVAN ) 0.5 MG tablet, Take 1 tablet (0.5 mg total) by mouth every 12 (twelve) hours as needed for anxiety., Disp: 30 tablet, Rfl: 0   sertraline  (ZOLOFT ) 50 MG tablet, Take 50 mg by mouth at bedtime., Disp: , Rfl:    VYVANSE  60 MG capsule, Take 60 mg by mouth every morning.,  Disp: , Rfl:   Facility-Administered Medications Ordered in Other Encounters:    sodium chloride  flush (NS) 0.9 % injection 10 mL, 10 mL, Intravenous, PRN, Magrinat, Gustav C, MD  Vitals  There were no vitals filed for this visit.  There is no height or weight on file to calculate BMI.  Physical Exam  Constitutional: Appears well-developed and well-nourished.  Psych: Affect appropriate to situation.  Eyes: No scleral injection.  HENT: No OP obstruction.  Head: Normocephalic. Cardiovascular: Normal rate and regular rhythm.  Respiratory: Effort normal, non-labored breathing.  GI: Soft.  No distension. There is no tenderness.  Skin: WDI.   Neurologic Examination    Mental Status -  Level of arousal and orientation to time, place, and person were intact. Language including expression, naming, repetition, comprehension was assessed and found intact. Attention span and concentration were normal. Recent and remote memory were intact. Fund of Knowledge was assessed and was intact.  Cranial Nerves II - XII - II - Visual field intact OU. III, IV, VI - Extraocular movements intact. V - Facial sensation intact bilaterally. VII - Facial movement intact bilaterally. VIII - Hearing & vestibular intact bilaterally. X - Palate elevates symmetrically. XI - Chin turning & shoulder shrug intact bilaterally. XII - Tongue protrusion intact.  Motor Strength - The patient's strength was normal in all extremities and pronator drift was absent.  Bulk was normal and fasciculations were absent.   Motor Tone - Muscle tone was assessed at the neck and appendages and was normal Sensory - decreased on left Coordination - The patient had normal movements in the hands and feet with no ataxia or dysmetria.  Tremor was absent. Gait and Station - deferred.  Labs/Imaging/Neurodiagnostic studies   CBC: No results for input(s): "WBC", "NEUTROABS", "HGB", "HCT", "MCV", "PLT" in the last 168 hours. Basic  Metabolic Panel:  Lab Results  Component Value Date   NA 137 11/10/2021   K 3.8 11/10/2021   CO2 27 11/10/2021   GLUCOSE 107 (H) 11/10/2021   BUN 8 11/10/2021   CREATININE 0.70 11/10/2021   CALCIUM  8.5 (L) 11/10/2021   GFRNONAA >60 11/10/2021   GFRAA >60 04/15/2019   Lipid Panel: No results found for: "LDLCALC" HgbA1c: No results found for: "HGBA1C" Urine Drug Screen: No results found for: "LABOPIA", "COCAINSCRNUR", "LABBENZ", "AMPHETMU", "THCU", "LABBARB"  Alcohol Level No results found for: "ETH" INR No results found for: "INR" APTT No results found for: "APTT" AED levels: No results found for: "PHENYTOIN", "ZONISAMIDE", "LAMOTRIGINE", "LEVETIRACETA"  CT Head without contrast(Personally reviewed):  Lesions within the right parietal lobe (16 mm) and at the left temporoparietal junction (15 mm) with mild surrounding edema.  MRI Brain(Personally reviewed): 1. 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 No hemorrhage . No brain shift or hydrocephalus.  EEG pending  ASSESSMENT   CINDE EBERT is a 38 y.o. female  hx of breast cancer, thyroid  cancer s/p thyroidectomy ADD, ocular migraines presented from home via EMS for an episode of acute aphasia.  Her symptoms had resolved by the time she arrived in the ED and she only had a left sided sensory deficit with NIH stroke scale of 1.  CT head was concerning for 2 mass lesions favored to be mets.  She was not a TNK candidate due to brain mets and also the episodes being favored to be seizures and not stroke.  She had 2 more episodes of aphasia (seizure) in the ED both of which resolved with Ativan .  She  was loaded with 4.5 g of Keppra.  MRI  brain showed 5 or possibly 6 metastatic lesions, 2 in the left thalamus, 1 at the right parietal occipital junction, 1 in the left posterior temporoparietal occipital region, 1 in the posterior cerebellum midline, questionable second lesion in the right cerebellar hemisphere.  There is some regional edema around the lesions but no brain shift or hydrocephalus.  RECOMMENDATIONS  - Patient is getting hooked up to cEEG now which should be continued until seizure free for at least 24 hrs - I shared MRI findings including actual images and concern that they represent multifocal metastases with patient, her husband, her father, and her mother-in-law (a retired NP) at bedside - S/p keppra 4.5g load, will continue at 1000mg  bid - Received decadron  10mg  IV x1 in ED, continue 4mg  bid, defer further mgmt of this to onc - Consult placed to oncology in ED  Will continue to follow ______________________________________________________________________   Greg Leaks, MD [facility] [phone]  If 7pm- 7am, please page neurology on call as listed in AMION.

## 2023-08-11 NOTE — Progress Notes (Signed)
 LTM EEG hooked up and running - no initial skin breakdown - push button tested - No [facility] monitoring. EEG monitoring in lab.

## 2023-08-11 NOTE — H&P (Signed)
 History and Physical    Patient: Kayla Little:[id number] DOB: 1985/12/24 DOA: 08/11/2023 DOS: the patient was seen and examined on 08/11/2023 PCP: [facility] [place]  Patient coming from: Home  Chief Complaint:  Chief Complaint  Patient presents with   Aphasia   HPI: Kayla Little is a 38 y.o. female with medical history significant of ADD, depression, and R breast cancer s/p b/l mastectomy/chemo/rad p/w acute encephalopathy and found to have new brainy mets.  Pt states that she was in her USOH until this morning when she had difficulty finding words to draft a text to her friend; in fact she reported to Neurology that she was "talking with someone and texting/writing things down when she acutely developed trouble speaking and getting her words out, she was not making sense nor was her writing, symptoms lasted about 1 hour and completely resolved." After the episode, she called her mother who was concerned enough to activate EMS.  In the ED, pt AFVSS. Labs notable for Na 134, and Cr 1.04. HCT "Lesions within the right parietal lobe (16 mm) and at the left temporoparietal junction (15 mm) with mild surrounding edema [suspicious for intracranial metastatic disease]". MRI brain showed " Five or possibly six metastatic lesions are identified, 2 in the left thalamus, 1 in the right cerebral hemisphere at the parietooccipital junction, 1 in the left cerebral hemisphere at the posterior temporal/parietal/occipital junction, 1 in the posterior midline cerebellum and questionable or early evidence of a second lesion in the right cerebellar hemisphere [w/o hemorrhage, hydrocephalus, or brain shift]." Pt admitted to medicine with Neuro and Oncology following.  Review of Systems: As mentioned in the history of present illness. All other systems reviewed and are negative. 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)    Hx of varicella    Kidney stone 2012   Medullary thyroid  carcinoma (HCC)    Monoallelic mutation of CHEK2 gene in female patient    Postpartum care following vaginal delivery (7/27) 10/02/2014, June 16 , 2020   Second degree perineal laceration 08/23/2018   SVD (spontaneous vaginal delivery) 08/23/2018   Thyroid  cancer (HCC) 2021   Past Surgical History:  Procedure Laterality Date   IR IMAGING GUIDED PORT INSERTION  07/22/2020   IR REMOVAL TUN ACCESS W/ PORT W/O FL MOD SED  11/12/2021   MODIFIED MASTECTOMY Right 01/07/2021   Procedure: RIGHT MODIFIED RADICAL MASTECTOMY;  Surgeon: Caralyn Chandler, MD;  Location: [facility];  Service: General;  Laterality: Right;   NO PAST SURGERIES     SIMPLE MASTECTOMY WITH AXILLARY SENTINEL NODE BIOPSY Left 01/07/2021   Procedure: LEFT SIMPLE MASTECTOMY;  Surgeon: Caralyn Chandler, MD;  Location: [facility];  Service: General;  Laterality: Left;   THYROIDECTOMY N/A 04/12/2019   Procedure: TOTAL THYROIDECTOMY WITH LIMITED LYMPH NODE DISSECTION;  Surgeon: Oralee Billow, MD;  Location: [facility];  Service: General;  Laterality: N/A;   UNILATERAL SALPINGECTOMY Right 07/09/2017   Procedure: RIGHT SALPINGECTOMY WITH REMOVAL OF ECTOPIC PREGNANCY;  Surgeon: Terri Fester, MD;  Location: [facility];  Service: Gynecology;  Laterality: Right;   Social History:  reports that she has been smoking cigarettes. She has a 2.5 pack-year smoking history. She has never used smokeless tobacco. She reports current alcohol use of about 7.0 - 14.0 standard drinks of alcohol per week. She reports that she does not currently use drugs after having used the following drugs: Marijuana.  No Known Allergies  Family History   Problem Relation Age of Onset   Breast cancer Mother 96       2 types of cancer one side mastectomy with chemo   Endometriosis Mother    Endometriosis Maternal Grandmother    Breast cancer Maternal Grandmother        post menopausal   Diabetes Maternal Grandmother    Cancer Maternal Grandfather        prostate and lymphoma   Alzheimer's disease Paternal Grandmother    Cancer Father 98       prostate cancer   Aneurysm Paternal Grandfather        d. 30s    Prior to Admission medications   Medication Sig Start Date End Date Taking? Authorizing Provider  acetaminophen  (TYLENOL ) 325 MG tablet Take 2 tablets (650 mg total) by mouth every 4 (four) hours as needed (for pain scale < 4). 08/23/18  Yes Sigmon, Meredith C, CNM  levonorgestrel (MIRENA) 20 MCG/DAY IUD 1 each by Intrauterine route once.   Yes [provider]  levothyroxine  (SYNTHROID ) 175 MCG tablet Take 175 mcg by mouth daily before breakfast.   Yes [provider]  LORazepam  (ATIVAN ) 0.5 MG tablet Take 1 tablet (0.5 mg total) by mouth every 12 (twelve) hours as needed for anxiety. Patient taking differently: Take 0.5 mg by mouth daily as needed for anxiety. 03/31/21  Yes Gudena, Vinay, MD  sertraline  (ZOLOFT ) 50 MG tablet Take 50 mg by mouth at bedtime. 01/26/17  Yes [provider]  VYVANSE  60 MG capsule Take 60 mg by mouth every morning. 07/28/20  Yes [provider]  prochlorperazine  (COMPAZINE ) 10 MG tablet Take 1 tablet (10 mg total) by mouth every 6 (six) hours as needed (Nausea or vomiting). 09/25/20 11/27/20  Percival Brace, NP    Physical Exam: Vitals:   08/11/23 1100 08/11/23 1130 08/11/23 1226  BP:  117/89 108/81  Pulse:  88 86  Resp:  15   Temp:  98.2 F (36.8 C)   TempSrc:  Oral   SpO2:  100% 98%  Weight: 78.8 kg     General: Alert, oriented x3, resting comfortably in no acute distress HEENT: EOMI, oropharynx clear, moist mucous membranes, hearing intact Neck:  Trachea midline and no gross thyromegaly Respiratory: Lungs clear to auscultation bilaterally with normal respiratory effort; no w/r/r Cardiovascular: Regular rate and rhythm w/o m/r/g Abdomen: Soft, nontender, nondistended. Positive bowel sounds MSK: No obvious joint deformities or swelling Skin: No obvious rashes or lesions Neurologic: Awake, alert, spontaneously moves all extremities, strength intact Psychiatric: Appropriate mood and affect, conversational and cooperative  Data Reviewed:  Lab Results  Component Value Date   WBC 10.2 08/11/2023   HGB 14.3 08/11/2023   HCT 42.0 08/11/2023   MCV 101.2 (H) 08/11/2023   PLT 327 08/11/2023   Lab Results  Component Value Date   GLUCOSE 105 (H) 08/11/2023   CALCIUM  8.6 (L) 08/11/2023   NA 136 08/11/2023   K 4.3 08/11/2023   CO2 20 (L) 08/11/2023   CL 103 08/11/2023   BUN 10 08/11/2023   CREATININE 0.90 08/11/2023   Lab Results  Component Value Date   ALT 24 08/11/2023   AST 29 08/11/2023   ALKPHOS 45 08/11/2023   BILITOT 0.7 08/11/2023   Lab Results  Component Value Date   INR 0.9 08/11/2023    Radiology: MR BRAIN W WO CONTRAST Result Date: 08/11/2023 CLINICAL DATA:  Metastatic disease evaluation. Right frontoparietal mass. History of breast cancer. Abnormal head CT. EXAM: MRI HEAD WITHOUT AND WITH CONTRAST TECHNIQUE: Multiplanar, multiecho pulse sequences of the brain and surrounding structures were obtained without and with intravenous contrast. CONTRAST:  7.5mL GADAVIST  GADOBUTROL  1 MMOL/ML IV SOLN COMPARISON:  Head CT same day FINDINGS: Brain: Diffusion imaging does not show any acute or subacute infarction. There are 2 punctate metastases noted within the left thalamus, the larger measuring 3 mm, axial image 50. Smaller adjacent punctate metastasis visible on the sagittal postcontrast images. There is an 8 mm enhancing metastasis within the posterosuperior midline cerebellum without edema or hemorrhage. In the right  cerebellar hemisphere more laterally, FLAIR image 10 and postcontrast coronal image 10, there is question of a subtle additional right cerebellar metastasis. This is not confirmed on the axial or sagittal images. Right cerebral hemisphere shows a 14 mm in diameter metastasis at the parietooccipital junction with regional vasogenic edema. No hemorrhage or necrosis. In the left hemisphere, other than the thalamic lesions, there is a 15 mm metastasis at the posterior temporal/parietal/occipital junction with surrounding vasogenic edema. No brain shift. No hydrocephalus. No extra-axial collection. No sign of widespread leptomeningeal enhancement. Vascular: Major vessels at the base of the brain show flow. Skull and upper cervical spine: Negative Sinuses/Orbits: Clear/normal Other: None IMPRESSION: 1. 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 No hemorrhage . No brain shift or hydrocephalus. Electronically Signed   By: Bettylou Brunner M.D.   On: 08/11/2023 12:55   CT HEAD CODE STROKE WO CONTRAST Result Date: 08/11/2023 CLINICAL DATA:  Code stroke. Neuro deficit, acute, stroke suspected. EXAM: CT HEAD WITHOUT CONTRAST TECHNIQUE: Contiguous axial images were obtained from the base of the skull through the vertex without intravenous contrast. RADIATION DOSE REDUCTION: This exam was performed according to the departmental dose-optimization program which includes automated exposure control, adjustment of the mA and/or kV according to patient size and/or use of iterative reconstruction technique. COMPARISON:  None. FINDINGS: Brain: No age-advanced or lobar predominant cerebral atrophy. 16 mm  lesion within the right parietal lobe with mild surrounding vasogenic edema (for instance as seen on series 7, image 33) (series 3, image 24). 15 mm lesion at the left temporoparietal junction with mild surrounding edema (best appreciated on series 6, image 27). These lesions are somewhat hyperdense and this may reflect associated hemorrhage. No demarcated cortical infarct. No extra-axial fluid collection. No midline shift. Vascular: No hyperdense vessel. Skull: No calvarial fracture or aggressive osseous lesion. Sinuses/Orbits: No mass or acute finding within the imaged orbits. No significant paranasal sinus disease at the imaged levels. ASPECTS (Alberta Stroke Program Early CT Score) - Ganglionic level infarction (caudate, lentiform nuclei, internal capsule, insula, M1-M3 cortex): 7 - Supraganglionic infarction (M4-M6 cortex): 3 Total score (0-10 with 10 being normal): 10 Study discussed by telephone at the time of interpretation on 08/11/2023 at 11:12 am with provider Dr. Doretta Gant. IMPRESSION:  1. Lesions within the right parietal lobe (16 mm) and at the left temporoparietal junction (15 mm) with mild surrounding edema. 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 The lesions are somewhat hyperdense, and this may reflect associated hemorrhage. A brain MRI (with and without contrast) is recommended for further evaluation. 2. No evidence of an acute infarct. Electronically Signed   By: Bascom Lily D.O.   On: 08/11/2023 11:22    Assessment and Plan: 1F h/o ADD, depression, and R breast cancer s/p b/l mastectomy/chemo/rad p/w acute encephalopathy and found to have new brainy mets.  Acute encephalopathy New metastatic brain lesions H/o R breast cancer s/p b/l mastectomy/chemo/rad -Neurology following; resc: cEEG, s/p keppra 4.5g load w/ 1g BID to follow; s/p decadron  10mg  IV x1 followed by PO decadron  4mg  BID -Oncology consulted; apprec eva/recs  Hypothyroid -PTA synthroid  175mcg  daily  Depression -PTA Zoloft  50mg  daily  ADD -PTA vyvanse  60mg  daily   Advance Care Planning:   Code Status: Full Code   Consults: Neurology and Oncology  Family Communication: Updated husband, Marylou Sobers, at the bedside (number in Epic)  Severity of Illness: The appropriate patient status for this patient is INPATIENT. Inpatient status is judged to be reasonable and necessary in order to provide the required intensity of service to ensure the patient's safety. The patient's presenting symptoms, physical exam findings, and initial radiographic and laboratory data in the context of their chronic comorbidities is felt to place them at high risk for further clinical deterioration. Furthermore, it is not anticipated that the patient will be medically stable for discharge from the hospital within 2 midnights of admission.   * I certify that at the point of admission it is my clinical judgment that the patient will require inpatient hospital care spanning beyond 2 midnights from the point of admission due to high intensity of service, high risk for further deterioration and high frequency of surveillance required.*   ------- I spent 55 minutes reviewing previous labs/notes, obtaining separate history at the bedside, counseling/discussing the treatment plan outlined above, ordering medications/tests, and performing clinical documentation.  Author: Arne Langdon, MD 08/11/2023 3:15 PM  For on call review [email or web address].

## 2023-08-11 NOTE — ED Provider Notes (Signed)
 [place] [facility] AT [facility] Provider Note   CSN: [id number] Arrival date & time: 08/11/23  1057  An emergency department physician performed an initial assessment on this suspected stroke patient at 63.  History  Chief Complaint  Patient presents with   Aphasia    Kayla Little is a 38 y.o. female.  HPI 38 year old female presents today as code stroke.  Patient with last known normal at 930.  She noticed difficulty speaking.  Symptoms have improved after approximately 1 hour.  She has had no similar symptoms in the past.  EMS reports prehospital heart rate was normal with blood pressure elevated 160 systolically.    Home Medications Prior to Admission medications   Medication Sig Start Date End Date Taking? Authorizing Provider  acetaminophen  (TYLENOL ) 325 MG tablet Take 2 tablets (650 mg total) by mouth every 4 (four) hours as needed (for pain scale < 4). 08/23/18  Yes Sigmon, Meredith C, CNM  levonorgestrel (MIRENA) 20 MCG/DAY IUD 1 each by Intrauterine route once.   Yes [provider]  levothyroxine  (SYNTHROID ) 175 MCG tablet Take 175 mcg by mouth daily before breakfast.   Yes [provider]  LORazepam  (ATIVAN ) 0.5 MG tablet Take 1 tablet (0.5 mg total) by mouth every 12 (twelve) hours as needed for anxiety. Patient taking differently: Take 0.5 mg by mouth daily as needed for anxiety. 03/31/21  Yes Gudena, Vinay, MD  sertraline  (ZOLOFT ) 50 MG tablet Take 50 mg by mouth at bedtime. 01/26/17  Yes [provider]  VYVANSE  60 MG capsule Take 60 mg by mouth every morning. 07/28/20  Yes [provider]  prochlorperazine  (COMPAZINE ) 10 MG tablet Take 1 tablet (10 mg total) by mouth every 6 (six) hours as needed (Nausea or vomiting). 09/25/20 11/27/20  Percival Brace, NP      Allergies    Patient has no known allergies.    Review of Systems   Review of Systems  Physical Exam Updated Vital Signs BP 108/81    Pulse 86   Temp 98.2 F (36.8 C) (Oral)   Resp 15   Wt 78.8 kg   SpO2 98%   BMI 28.04 kg/m  Physical Exam Vitals reviewed.  Constitutional:      General: She is not in acute distress. HENT:     Head: Normocephalic and atraumatic.     Right Ear: External ear normal.     Left Ear: External ear normal.     Nose: Nose normal.     Mouth/Throat:     Pharynx: Oropharynx is clear.  Eyes:     Extraocular Movements: Extraocular movements intact.     Pupils: Pupils are equal, round, and reactive to light.  Pulmonary:     Effort: Pulmonary effort is normal.  Abdominal:     Palpations: Abdomen is soft.  Musculoskeletal:     Cervical back: Normal range of motion.  Skin:    General: Skin is warm.     Capillary Refill: Capillary refill takes less than 2 seconds.  Neurological:     General: No focal deficit present.     Mental Status: She is alert.  Psychiatric:        Mood and Affect: Mood normal.     ED Results / Procedures / Treatments   Labs (all labs ordered are listed, but only abnormal results are displayed) Labs Reviewed  CBC - Abnormal; Notable for the following components:      Result Value   MCV  101.2 (*)    All other components within normal limits  COMPREHENSIVE METABOLIC PANEL WITH GFR - Abnormal; Notable for the following components:   Sodium 134 (*)    CO2 20 (*)    Glucose, Bld 105 (*)    Creatinine, Ser 1.04 (*)    Calcium  8.6 (*)    All other components within normal limits  I-STAT CHEM 8, ED - Abnormal; Notable for the following components:   Glucose, Bld 105 (*)    Calcium , Ion 1.01 (*)    TCO2 21 (*)    All other components within normal limits  CBG MONITORING, ED - Abnormal; Notable for the following components:   Glucose-Capillary 111 (*)    All other components within normal limits  ETHANOL  PROTIME-INR  APTT  DIFFERENTIAL  HCG, SERUM, QUALITATIVE  RAPID URINE DRUG SCREEN, HOSP PERFORMED  HIV ANTIBODY (ROUTINE TESTING W REFLEX)     EKG EKG Interpretation Date/Time:  Thursday August 11 2023 11:20:20 EDT Ventricular Rate:  82 PR Interval:  162 QRS Duration:  100 QT Interval:  388 QTC Calculation: 454 R Axis:   45  Text Interpretation: Sinus rhythm Right atrial enlargement Baseline wander in lead(s) V1 Confirmed by Auston Blush [phone]) on 08/11/2023 12:59:57 PM  Radiology MR BRAIN W WO CONTRAST Result Date: 08/11/2023 CLINICAL DATA:  Metastatic disease evaluation. Right frontoparietal mass. History of breast cancer. Abnormal head CT. EXAM: MRI HEAD WITHOUT AND WITH CONTRAST TECHNIQUE: Multiplanar, multiecho pulse sequences of the brain and surrounding structures were obtained without and with intravenous contrast. CONTRAST:  7.5mL GADAVIST  GADOBUTROL  1 MMOL/ML IV SOLN COMPARISON:  Head CT same day FINDINGS: Brain: Diffusion imaging does not show any acute or subacute infarction. There are 2 punctate metastases noted within the left thalamus, the larger measuring 3 mm, axial image 50. Smaller adjacent punctate metastasis visible on the sagittal postcontrast images. There is an 8 mm enhancing metastasis within the posterosuperior midline cerebellum without edema or hemorrhage. In the right cerebellar hemisphere more laterally, FLAIR image 10 and postcontrast coronal image 10, there is question of a subtle additional right cerebellar metastasis. This is not confirmed on the axial or sagittal images. Right cerebral hemisphere shows a 14 mm in diameter metastasis at the parietooccipital junction with regional vasogenic edema. No hemorrhage or necrosis. In the left hemisphere, other than the thalamic lesions, there is a 15 mm metastasis at the posterior temporal/parietal/occipital junction with surrounding vasogenic edema. No brain shift. No hydrocephalus. No extra-axial collection. No sign of widespread leptomeningeal enhancement. Vascular: Major vessels at the base of the brain show flow. Skull and upper cervical spine: Negative  Sinuses/Orbits: Clear/normal Other: None IMPRESSION: 1. 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 No hemorrhage . No brain shift or hydrocephalus. Electronically Signed   By: Bettylou Brunner M.D.   On: 08/11/2023 12:55   CT HEAD CODE STROKE WO CONTRAST Result Date: 08/11/2023 CLINICAL DATA:  Code stroke. Neuro deficit, acute, stroke suspected. EXAM: CT HEAD WITHOUT CONTRAST TECHNIQUE: Contiguous axial images were obtained from the base of the skull through the vertex without intravenous contrast. RADIATION DOSE REDUCTION: This exam was performed according to the departmental dose-optimization program which includes automated exposure control, adjustment of  the mA and/or kV according to patient size and/or use of iterative reconstruction technique. COMPARISON:  None. FINDINGS: Brain: No age-advanced or lobar predominant cerebral atrophy. 16 mm lesion within the right parietal lobe with mild surrounding vasogenic edema (for instance as seen on series 7, image 33) (series 3, image 24). 15 mm lesion at the left temporoparietal junction with mild surrounding edema (best appreciated on series 6, image 27). These lesions are somewhat hyperdense and this may reflect associated hemorrhage. No demarcated cortical infarct. No extra-axial fluid collection. No midline shift. Vascular: No hyperdense vessel. Skull: No calvarial fracture or aggressive osseous lesion. Sinuses/Orbits: No mass or acute finding within the imaged orbits. No significant paranasal sinus disease at the imaged levels. ASPECTS (Alberta Stroke Program Early CT Score) - Ganglionic level infarction (caudate, lentiform nuclei,  internal capsule, insula, M1-M3 cortex): 7 - Supraganglionic infarction (M4-M6 cortex): 3 Total score (0-10 with 10 being normal): 10 Study discussed by telephone at the time of interpretation on 08/11/2023 at 11:12 am with provider Dr. Doretta Gant. IMPRESSION: 1. Lesions within the right parietal lobe (16 mm) and at the left temporoparietal junction (15 mm) with mild surrounding edema. 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 The lesions are somewhat hyperdense, and this may reflect associated hemorrhage. A brain MRI (with and without contrast) is recommended for further evaluation. 2. No evidence of an acute infarct. Electronically Signed   By: Bascom Lily D.O.   On: 08/11/2023 11:22    Procedures .Critical Care  Performed by: Auston Blush, MD Authorized by: Auston Blush, MD   Critical care provider statement:    Critical care time (minutes):  30   Critical care end time:  08/11/2023 12:59 PM   Critical care time was exclusive of:  Separately billable procedures and treating other patients and teaching time   Critical care was necessary to treat or prevent imminent or life-threatening deterioration of the following conditions:  CNS failure or compromise   Critical care was time spent personally by me on the following activities:  Development of treatment plan with patient or surrogate, discussions with consultants, evaluation of patient's response to treatment, examination of patient, ordering and review of laboratory studies, ordering and review of radiographic studies, ordering and performing treatments and interventions, pulse oximetry, re-evaluation of patient's condition and review of old charts     Medications Ordered in ED Medications  LORazepam  (ATIVAN ) 2 MG/ML injection (  Not Given 08/11/23 1242)  LORazepam  (ATIVAN ) injection 2 mg (2 mg Intravenous Given 08/11/23 1120)  levETIRAcetam (KEPPRA) undiluted injection 4,500 mg (4,500 mg Intravenous Given 08/11/23 1121)   LORazepam  (ATIVAN ) injection 1 mg (1 mg Intravenous Given 08/11/23 1131)  dexamethasone  (DECADRON ) injection 10 mg (10 mg Intravenous Given 08/11/23 1229)  gadobutrol  (GADAVIST ) 1 MMOL/ML injection 7.5 mL (7.5 mLs Intravenous Contrast Given 08/11/23 1212)    ED Course/ Medical Decision Making/ A&P Clinical Course as of 08/11/23 1335  Thu Aug 11, 2023  1247 Complete metabolic panel reviewed interpreted significant for CO2 of 20, glucose 105, mild hypocalcemia and mild hyponatremia at 134 pregnancy test is negative CBC reviewed interpreted within normal limits INR is normal [DR]  1248 CT head reviewed interpreted significant for lesions within the right parietal lobe and at the left temporoparietal junction with mild surrounding edema most suspicious for intracranial metastatic disease [DR]    Clinical Course User Index [DR] Auston Blush, MD  Medical Decision Making Amount and/or Complexity of Data Reviewed Labs: ordered. Radiology: ordered.  Risk Prescription drug management. Decision regarding hospitalization.   Patient presented today as a code stroke.  Last known normal at 930 with episode of aphasia.  This improved with according to EMS and route.  Patient was hypertensive. Patient evaluated here on arrival as code stroke with myself and Dr. Doretta Gant at bedside.  She went directly to CT scanner. Patient head CT is concerning for metastatic disease and patient has history of breast cancer and thyroid  cancer. Dr. Gudina was consulted before patient went to MR as the patient will be receiving contrast and will not be able to receive more contrast in the next 24 hours.  He feels that at this time, we can have the MRI of the head only and they can image the rest of the body later as appropriate does not likely need to undergo the risk of being in the MRI currently while she is having intermittent neurological symptoms. Please see Dr. Reuel Castle full notes.  Patient  is being given Keppra and Ativan  as she appears to be having intermittent symptoms that may be secondary to seizure with significant concern for metastatic disease She has received IV Decadron  here in the ED She will be undergoing continuous EEG. Hospitalist are being paged for admission.  Patient's primary is [facility] Discussed care with Dr. Sulema Endo who will see for admission        Final Clinical Impression(s) / ED Diagnoses Final diagnoses:  Aphasia  Brain lesion    Rx / DC Orders ED Discharge Orders     None         Auston Blush, MD 08/11/23 1544

## 2023-08-12 ENCOUNTER — Inpatient Hospital Stay (HOSPITAL_COMMUNITY)

## 2023-08-12 ENCOUNTER — Other Ambulatory Visit: Payer: Self-pay | Admitting: Radiation Oncology

## 2023-08-12 ENCOUNTER — Other Ambulatory Visit: Payer: Self-pay | Admitting: Radiation Therapy

## 2023-08-12 ENCOUNTER — Other Ambulatory Visit (HOSPITAL_COMMUNITY): Payer: Self-pay

## 2023-08-12 ENCOUNTER — Ambulatory Visit: Admitting: Radiation Oncology

## 2023-08-12 DIAGNOSIS — R4701 Aphasia: Principal | ICD-10-CM

## 2023-08-12 DIAGNOSIS — C7931 Secondary malignant neoplasm of brain: Secondary | ICD-10-CM | POA: Diagnosis not present

## 2023-08-12 DIAGNOSIS — F32A Depression, unspecified: Secondary | ICD-10-CM | POA: Insufficient documentation

## 2023-08-12 DIAGNOSIS — C50919 Malignant neoplasm of unspecified site of unspecified female breast: Secondary | ICD-10-CM | POA: Diagnosis not present

## 2023-08-12 DIAGNOSIS — R569 Unspecified convulsions: Secondary | ICD-10-CM | POA: Diagnosis not present

## 2023-08-12 LAB — FOLATE: Folate: 12.3 ng/mL (ref >5.9)

## 2023-08-12 LAB — VITAMIN B12: Vitamin B-12: 169 pg/mL — ABNORMAL LOW (ref 180–914)

## 2023-08-12 LAB — HIV ANTIBODY (ROUTINE TESTING W REFLEX): HIV Screen 4th Generation wRfx: NONREACTIVE

## 2023-08-12 MED ORDER — LEVETIRACETAM (KEPPRA) 500 MG/5 ML ADULT IV PUSH
1250.0000 mg | Freq: Two times a day (BID) | INTRAVENOUS | Status: DC
  Administered 2023-08-12: 1250 mg via INTRAVENOUS
  Filled 2023-08-12: qty 15

## 2023-08-12 MED ORDER — LEVETIRACETAM 1000 MG PO TABS
1000.0000 mg | ORAL_TABLET | Freq: Two times a day (BID) | ORAL | 3 refills | Status: DC
  Filled 2023-08-12: qty 60, 30d supply, fill #0

## 2023-08-12 MED ORDER — LEVETIRACETAM 250 MG PO TABS
250.0000 mg | ORAL_TABLET | Freq: Two times a day (BID) | ORAL | 3 refills | Status: DC
  Filled 2023-08-12: qty 60, 30d supply, fill #0

## 2023-08-12 MED ORDER — IOHEXOL 9 MG/ML PO SOLN
500.0000 mL | ORAL | Status: AC
  Administered 2023-08-12 (×2): 500 mL via ORAL

## 2023-08-12 MED ORDER — DEXAMETHASONE 4 MG PO TABS
4.0000 mg | ORAL_TABLET | Freq: Three times a day (TID) | ORAL | Status: DC
  Administered 2023-08-12: 4 mg via ORAL
  Filled 2023-08-12: qty 1

## 2023-08-12 MED ORDER — DIAZEPAM (20 MG DOSE) 2 X 10 MG/0.1ML NA LQPK
20.0000 mg | Freq: Once | NASAL | 0 refills | Status: AC | PRN
  Filled 2023-08-12: qty 2, 30d supply, fill #0

## 2023-08-12 MED ORDER — DEXAMETHASONE 4 MG PO TABS
4.0000 mg | ORAL_TABLET | Freq: Three times a day (TID) | ORAL | 1 refills | Status: DC
  Filled 2023-08-12: qty 90, 30d supply, fill #0

## 2023-08-12 MED ORDER — DEXAMETHASONE 4 MG PO TABS: 4.0000 mg | ORAL_TABLET | Freq: Three times a day (TID) | ORAL | 1 refills | Status: DC

## 2023-08-12 MED ORDER — IOHEXOL 350 MG/ML SOLN
75.0000 mL | Freq: Once | INTRAVENOUS | Status: AC | PRN
  Administered 2023-08-12: 75 mL via INTRAVENOUS

## 2023-08-12 NOTE — Discharge Summary (Signed)
 Physician Discharge Summary   Kayla Little:[id number] DOB: 05-Jul-1985 DOA: 08/11/2023  PCP: [facility] [place]  Admit date: 08/11/2023 Discharge date: 08/12/2023  Admitted From: Home Disposition:  Home Discharging physician: Faith Homes, MD Barriers to discharge: none  Recommendations at discharge: Follow ups planned with oncology and rad onc GI referral placed for colon thickening Keppra and decadron  continued at discharge; Valtoco PRN if has breakthru seizure   Discharge Condition: stable CODE STATUS: Full  Diet recommendation:  Diet Orders (From admission, onward)     Start     Ordered   08/12/23 0000  Diet general        08/12/23 1318   08/11/23 1335  Diet regular Room service appropriate? Yes; Fluid consistency: Thin  Diet effective now       Question Answer Comment  Room service appropriate? Yes   Fluid consistency: Thin      08/11/23 1335            Hospital Course: [name] is a 38 yo female with PMH right breast cancer s/p bilateral mastectomies followed by chemoradiation and antiestrogen therapy.  Initially diagnosed in 2022 and last seen by oncology in 2023. Presented with aphasia of sudden onset which resolved after approximately 1 hour.  She was initially brought to the hospital for stroke workup.  MRI brain was negative for acute infarct but did reveal underlying lesions concerning for metastatic lesions.  She was admitted for further evaluation with oncology.  Assessment and Plan: * Breast cancer metastasized to brain [facility]) - hx R breast cancer; dx 2022; underwent bilateral mastectomies followed by chemo, radiation, and antiestrogen therapy -Last seen by oncology in 2023 - Presented with aphasia and strokelike symptoms.  MRI brain negative for infarct but revealing multiple metastatic lesions, see report for full read - Oncology, radiation oncology, and neurosurgery aware of hospitalization; tentative plan for outpatient  radiation -Patient will continue on Decadron  at discharge - CT chest/abdomen/pelvis performed for staging.  No obvious signs of metastatic disease or lymphadenopathy per CT report; does mention diffuse wall thickening involving colon (may need outpatient referral for GI for colonoscopy); placing referral at time of discharge   Aphasia - Stroke workup negative.  Symptoms felt to be due to presumed seizure in setting of underlying metastatic brain lesions -EEG performed and evaluated by neurology.  Recommended to continue on Keppra 1250 mg BID at discharge  - valtoco PRN for breakthrough seizures  Depression - Continue Zoloft   Postoperative hypothyroidism - Continue Synthroid   Attention deficit disorder - Continue Vyvanse    The patient's acute and chronic medical conditions were treated accordingly. On day of discharge, patient was felt deemed stable for discharge. Patient/family member advised to call PCP or come back to ER if needed.   Principal Diagnosis: Breast cancer metastasized to brain [facility])  Discharge Diagnoses: Active Hospital Problems   Diagnosis Date Noted   Breast cancer metastasized to brain [facility]) 08/11/2023    Priority: 1.   Aphasia 08/12/2023    Priority: 1.   Depression 08/12/2023   Attention deficit disorder 01/23/2021   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    Resolved Hospital Problems  No resolved problems to display.     Discharge Instructions     Ambulatory referral to Gastroenterology   Complete by: As directed    Thickened colon in setting of recurrent metastatic breast cancer   What is the reason for referral?: Colonoscopy   Diet general   Complete by: As directed  Increase activity slowly   Complete by: As directed       Allergies as of 08/12/2023   No Known Allergies      Medication List     TAKE these medications    acetaminophen  325 MG tablet Commonly known as: Tylenol  Take 2 tablets (650 mg total) by mouth every 4 (four)  hours as needed (for pain scale < 4).   dexamethasone  4 MG tablet Commonly known as: DECADRON  Take 1 tablet (4 mg total) by mouth 3 (three) times daily.   diazePAM (20 MG Dose) 2 x 10 MG/0.1ML Lqpk Place 20 mg into the nose once as needed for up to 1 dose. Use for seizure lasting greater than 2 minutes   levETIRAcetam 1000 MG tablet Commonly known as: Keppra Take 1 tablet (1,000 mg total) by mouth 2 (two) times daily. Take with Keppra 250 mg tablet for total dose of 1,250 mg   levETIRAcetam 250 MG tablet Commonly known as: Keppra Take 1 tablet (250 mg total) by mouth 2 (two) times daily. Take with Keppra 1,000 mg tablet for total dose of 1,250 mg   levonorgestrel 20 MCG/DAY Iud Commonly known as: MIRENA 1 each by Intrauterine route once.   levothyroxine  175 MCG tablet Commonly known as: SYNTHROID  Take 175 mcg by mouth daily before breakfast.   LORazepam  0.5 MG tablet Commonly known as: ATIVAN  Take 1 tablet (0.5 mg total) by mouth every 12 (twelve) hours as needed for anxiety. What changed: when to take this   sertraline  50 MG tablet Commonly known as: ZOLOFT  Take 50 mg by mouth at bedtime.   Vyvanse  60 MG capsule Generic drug: lisdexamfetamine Take 60 mg by mouth every morning.        No Known Allergies  Consultations: Oncology Neurosurgery Rad-onc to see outpatient   Procedures:   Discharge Exam: BP 107/71 (BP Location: Left Arm)   Pulse 82   Temp 97.9 F (36.6 C) (Oral)   Resp 18   Wt 78.8 kg   SpO2 100%   BMI 28.04 kg/m  Physical Exam Constitutional:      Appearance: Normal appearance.  HENT:     Head: Normocephalic and atraumatic.     Mouth/Throat:     Mouth: Mucous membranes are moist.  Eyes:     Extraocular Movements: Extraocular movements intact.  Cardiovascular:     Rate and Rhythm: Normal rate and regular rhythm.  Pulmonary:     Effort: Pulmonary effort is normal. No respiratory distress.     Breath sounds: Normal breath sounds. No  wheezing.  Abdominal:     General: Bowel sounds are normal. There is no distension.     Palpations: Abdomen is soft.     Tenderness: There is no abdominal tenderness.  Musculoskeletal:        General: Normal range of motion.     Cervical back: Normal range of motion and neck supple.  Skin:    General: Skin is warm and dry.  Neurological:     General: No focal deficit present.     Mental Status: She is alert.  Psychiatric:        Mood and Affect: Mood normal.      The results of significant diagnostics from this hospitalization (including imaging, microbiology, ancillary and laboratory) are listed below for reference.   Microbiology: No results found for this or any previous visit (from the past 240 hours).   Labs: BNP (last 3 results) No results for input(s): "BNP" in the last  8760 hours. Basic Metabolic Panel: Recent Labs  Lab 08/11/23 1108 08/11/23 1109  NA 134* 136  K 4.2 4.3  CL 103 103  CO2 20*  --   GLUCOSE 105* 105*  BUN 10 10  CREATININE 1.04* 0.90  CALCIUM  8.6*  --    Liver Function Tests: Recent Labs  Lab 08/11/23 1108  AST 29  ALT 24  ALKPHOS 45  BILITOT 0.7  PROT 6.9  ALBUMIN  3.9   No results for input(s): "LIPASE", "AMYLASE" in the last 168 hours. No results for input(s): "AMMONIA" in the last 168 hours. CBC: Recent Labs  Lab 08/11/23 1108 08/11/23 1109  WBC 10.2  --   NEUTROABS 6.6  --   HGB 13.7 14.3  HCT 41.1 42.0  MCV 101.2*  --   PLT 327  --    Cardiac Enzymes: No results for input(s): "CKTOTAL", "CKMB", "CKMBINDEX", "TROPONINI" in the last 168 hours. BNP: Invalid input(s): "POCBNP" CBG: Recent Labs  Lab 08/11/23 1103  GLUCAP 111*   D-Dimer No results for input(s): "DDIMER" in the last 72 hours. Hgb A1c No results for input(s): "HGBA1C" in the last 72 hours. Lipid Profile No results for input(s): "CHOL", "HDL", "LDLCALC", "TRIG", "CHOLHDL", "LDLDIRECT" in the last 72 hours. Thyroid  function studies No results for  input(s): "TSH", "T4TOTAL", "T3FREE", "THYROIDAB" in the last 72 hours.  Invalid input(s): "FREET3" Anemia work up No results for input(s): "VITAMINB12", "FOLATE", "FERRITIN", "TIBC", "IRON ", "RETICCTPCT" in the last 72 hours. Urinalysis    Component Value Date/Time   COLORURINE RED (A) 10/18/2020 1107   APPEARANCEUR HAZY (A) 10/18/2020 1107   LABSPEC 1.009 10/18/2020 1107   PHURINE 8.0 10/18/2020 1107   GLUCOSEU NEGATIVE 10/18/2020 1107   HGBUR LARGE (A) 10/18/2020 1107   BILIRUBINUR NEGATIVE 10/18/2020 1107   BILIRUBINUR small (A) 02/16/2017 1216   BILIRUBINUR small 12/04/2012 1615   KETONESUR NEGATIVE 10/18/2020 1107   PROTEINUR 30 (A) 10/18/2020 1107   UROBILINOGEN 0.2 02/16/2017 1216   UROBILINOGEN 0.2 09/18/2013 1555   NITRITE NEGATIVE 10/18/2020 1107   LEUKOCYTESUR NEGATIVE 10/18/2020 1107   Sepsis Labs Recent Labs  Lab 08/11/23 1108  WBC 10.2   Microbiology No results found for this or any previous visit (from the past 240 hours).  Procedures/Studies: CT CHEST ABDOMEN PELVIS W CONTRAST Result Date: 08/12/2023 CLINICAL DATA:  Metastatic breast cancer, assess treatment response * Tracking Code: BO * EXAM: CT CHEST, ABDOMEN, AND PELVIS WITH CONTRAST TECHNIQUE: Multidetector CT imaging of the chest, abdomen and pelvis was performed following the standard protocol during bolus administration of intravenous contrast. RADIATION DOSE REDUCTION: This exam was performed according to the departmental dose-optimization program which includes automated exposure control, adjustment of the mA and/or kV according to patient size and/or use of iterative reconstruction technique. CONTRAST:  75mL OMNIPAQUE IOHEXOL 350 MG/ML SOLN COMPARISON:  PET-CT, 07/21/2020 FINDINGS: CT CHEST FINDINGS Cardiovascular: No significant vascular findings. Normal heart size. No pericardial effusion. Mediastinum/Nodes: No enlarged mediastinal, hilar, or axillary lymph nodes. Thyroidectomy. Trachea, and esophagus  demonstrate no significant findings. 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 Musculoskeletal: Status post bilateral mastectomy with axillary lymph node dissection. No acute osseous findings. CT ABDOMEN PELVIS FINDINGS Hepatobiliary: No solid liver abnormality is seen. Hepatic steatosis. No gallstones, gallbladder wall thickening, or biliary dilatation. Pancreas: Unremarkable. No pancreatic ductal dilatation or surrounding inflammatory changes. Spleen: Normal in size without significant abnormality. Adrenals/Urinary Tract: Adrenal glands are unremarkable. Kidneys are normal, without renal  calculi, solid lesion, or hydronephrosis. Bladder is unremarkable. Stomach/Bowel: Stomach is within normal limits. Appendix appears normal. Diffuse wall thickening and mucosal hyperenhancement of the decompressed descending colon, sigmoid colon, and rectum (series 6, image 44, series 3, image 93). Vascular/Lymphatic: No significant vascular findings are present. No enlarged abdominal or pelvic lymph nodes. Reproductive: No mass or other abnormality. IUD present in the fundal endometrial cavity. Other: No abdominal wall hernia or abnormality. No ascites. Musculoskeletal: No acute osseous findings. IMPRESSION: 1. Status post bilateral mastectomy with axillary lymph node dissection. 2. No evidence of lymphadenopathy or metastatic disease in the chest, abdomen, or pelvis. 3. Diffuse wall thickening and mucosal hyperenhancement of the decompressed descending colon, sigmoid colon, and rectum, consistent with nonspecific infectious or inflammatory colitis. 4. Hepatic steatosis. Electronically Signed   By: Fredricka Jenny M.D.   On: 08/12/2023 12:30   Overnight EEG with video Result Date: 08/12/2023 Arleene Lack, MD     08/12/2023  6:59 AM Patient Name: Kayla Little MRN: [id number] Epilepsy Attending: Arleene Lack Referring Physician/Provider: Laymond Priestly, NP Duration: 08/11/2023 1334 to 08/12/2023 1478 Patient history: 38 y.o. female  hx of breast cancer, thyroid  cancer s/p thyroidectomy ADD, ocular migraines presented from home via EMS for an episode of acute aphasia.  EEG to evaluate for seizure Level of alertness: Awake, asleep AEDs during EEG study: LEV Technical aspects: This EEG study was done with scalp electrodes positioned according to the 10-20 International system of electrode placement. Electrical activity was reviewed with band pass filter of 1-70Hz , sensitivity of 7 uV/mm, display speed of 30mm/sec with a 60Hz  notched filter applied as appropriate. EEG data were recorded continuously and digitally stored.  Video monitoring was available and reviewed as appropriate. Description: The posterior dominant rhythm consists of 9Hz  activity of moderate voltage (25-35 uV) seen predominantly in posterior head regions, symmetric and reactive to eye opening and eye closing. Sleep was characterized by vertex waves, sleep spindles (12 to 14 Hz), maximal frontocentral region. Hyperventilation and photic stimulation were not performed.   IMPRESSION: This study is within normal limits. No seizures or epileptiform discharges were seen throughout the recording. A normal interictal EEG does not exclude the diagnosis of epilepsy. Arleene Lack   MR BRAIN W WO CONTRAST Result Date: 08/11/2023 CLINICAL DATA:  Metastatic disease evaluation. Right frontoparietal mass. History of breast cancer. Abnormal head CT. EXAM: MRI HEAD WITHOUT AND WITH CONTRAST TECHNIQUE: Multiplanar, multiecho pulse sequences of the brain and surrounding structures were obtained without and with intravenous contrast. CONTRAST:  7.5mL GADAVIST  GADOBUTROL  1 MMOL/ML IV SOLN COMPARISON:  Head CT same day FINDINGS: Brain: Diffusion imaging does not show any acute or subacute infarction. There are 2 punctate metastases noted within the left thalamus, the larger measuring 3 mm, axial image 50.  Smaller adjacent punctate metastasis visible on the sagittal postcontrast images. There is an 8 mm enhancing metastasis within the posterosuperior midline cerebellum without edema or hemorrhage. In the right cerebellar hemisphere more laterally, FLAIR image 10 and postcontrast coronal image 10, there is question of a subtle additional right cerebellar metastasis. This is not confirmed on the axial or sagittal images. Right cerebral hemisphere shows a 14 mm in diameter metastasis at the parietooccipital junction with regional vasogenic edema. No hemorrhage or necrosis. In the left hemisphere, other than the thalamic lesions, there is a 15 mm metastasis at the posterior temporal/parietal/occipital junction with surrounding vasogenic edema. No brain shift. No hydrocephalus. No extra-axial collection. No sign of widespread leptomeningeal  enhancement. Vascular: Major vessels at the base of the brain show flow. Skull and upper cervical spine: Negative Sinuses/Orbits: Clear/normal Other: None IMPRESSION: 1. 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 No hemorrhage . No brain shift or hydrocephalus. Electronically Signed   By: Bettylou Brunner M.D.   On: 08/11/2023 12:55   CT HEAD CODE STROKE WO CONTRAST Result Date: 08/11/2023 CLINICAL DATA:  Code stroke. Neuro deficit, acute, stroke suspected. EXAM: CT HEAD WITHOUT CONTRAST TECHNIQUE: Contiguous axial images were obtained from the base of the skull through the vertex without intravenous contrast. RADIATION DOSE REDUCTION: This exam was performed according to the departmental dose-optimization program which includes automated exposure  control, adjustment of the mA and/or kV according to patient size and/or use of iterative reconstruction technique. COMPARISON:  None. FINDINGS: Brain: No age-advanced or lobar predominant cerebral atrophy. 16 mm lesion within the right parietal lobe with mild surrounding vasogenic edema (for instance as seen on series 7, image 33) (series 3, image 24). 15 mm lesion at the left temporoparietal junction with mild surrounding edema (best appreciated on series 6, image 27). These lesions are somewhat hyperdense and this may reflect associated hemorrhage. No demarcated cortical infarct. No extra-axial fluid collection. No midline shift. Vascular: No hyperdense vessel. Skull: No calvarial fracture or aggressive osseous lesion. Sinuses/Orbits: No mass or acute finding within the imaged orbits. No significant paranasal sinus disease at the imaged levels. ASPECTS ([place] Stroke Program Early CT Score) - Ganglionic level infarction (caudate, lentiform nuclei, internal capsule, insula, M1-M3 cortex): 7 - Supraganglionic infarction (M4-M6 cortex): 3 Total score (0-10 with 10 being normal): 10 Study discussed by telephone at the time of interpretation on 08/11/2023 at 11:12 am with provider Dr. Doretta Gant. IMPRESSION: 1. Lesions within the right parietal lobe (16 mm) and at the left temporoparietal junction (15 mm) with mild surrounding edema. 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 The lesions are somewhat hyperdense, and this may reflect associated hemorrhage. A brain MRI (with and without contrast) is recommended for further evaluation. 2. No evidence of an acute infarct. Electronically Signed   By: Bascom Lily D.O.   On: 08/11/2023 11:22     Time coordinating discharge: Over 30 minutes    Faith Homes, MD  [facility] 08/12/2023, 1:22 PM

## 2023-08-12 NOTE — Assessment & Plan Note (Addendum)
-   hx R breast cancer; dx 2022; underwent bilateral mastectomies followed by chemo, radiation, and antiestrogen therapy -Last seen by oncology in 2023 - Presented with aphasia and strokelike symptoms.  MRI brain negative for infarct but revealing multiple metastatic lesions, see report for full read - Oncology, radiation oncology, and neurosurgery aware of hospitalization; tentative plan for outpatient radiation -Patient will continue on Decadron  at discharge - CT chest/abdomen/pelvis performed for staging.  No obvious signs of metastatic disease or lymphadenopathy per CT report; does mention diffuse wall thickening involving colon (may need outpatient referral for GI for colonoscopy); placing referral at time of discharge

## 2023-08-12 NOTE — Consult Note (Signed)
 [facility] CONSULT NOTE  Patient Care Team: [facility] [place] as PCP - General (Family Medicine) Johna Myers, MD as Consulting Physician (Radiation Oncology) Oralee Billow, MD as Consulting Physician (General Surgery) Cameron Cea, MD as Consulting Physician (Hematology and Oncology) Caralyn Chandler, MD as Consulting Physician (General Surgery) Gordy Lauber, MD as Consulting Physician (Endocrinology) Meriam Stamp, MD as Consulting Physician (Obstetrics and Gynecology)  CHIEF COMPLAINTS/PURPOSE OF CONSULTATION:  Brain metastases  HISTORY OF PRESENTING ILLNESS:  Mrs. 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  She has not received breast cancer treatment for a year and a half because of passing and insinuating circumstances. She presented to the emergency room with the strokelike symptoms of aphasia the brain revealed 5-6 brain metastases.  She was started on dexamethasone  and has had no further issues with speech.  I reviewed her records extensively and collaborated the history with the patient.  SUMMARY OF ONCOLOGIC HISTORY: Oncology History  Malignant neoplasm of upper-inner quadrant of right breast in female, estrogen receptor positive (HCC)  07/08/2020 Initial Diagnosis   a clinical mT2 N3, stage IIIA invasive ductal carcinoma, grade 1, triple positive  (a) breast MRI 07/06/2020 finds a 1.6 cm lower inner quadrant mass associated with 5.4 cm of non-mass-like enhancement, at least 8 enlarged right axillary lymph node and 1 right internal mammary lymph node  (b) CT scans of the neck and chest 06/24/2020 shows subcentimeter right cervical adenopathy, 2  right supraclavicular lymph nodes the larger 0.65 cm, also left cervical adenopathy largest measuring 1.02 cm; there is also a 1.2 cm lesion in the left hepatic lobe, and a sclerotic lesion in the mid thoracic spine at T7  (c) PET scan 07/21/2020 shows regional adenopathy (including 2 right subcentimeter supraclavicular lymph nodes and subcentimeter lymph nodes in the right hilar, subcarinal and lateral aortic region), no uptake in liver, and an indeterminate T12 lesion  (d) thoracic spine MRI 07/27/2020 shows the T12 lesion to be likely benign, there is also degenerative disc disease   07/23/2020 - 12/18/2020 Neo-Adjuvant Chemotherapy   neoadjuvant chemotherapy will consist of carboplatin , docetaxel , trastuzumab , and pertuzumab  q. 21 days x 6 07/23/2020-11/06/2020 --two doses of Trastuzumab  given on 9/22 and 10/13   01/07/2021 Cancer Staging   Staging form: Breast, AJCC 8th Edition - Pathologic stage from 01/07/2021: No Stage Recommended (ypT2, pN76mi, cM0, G2) - Signed by Percival Brace, NP on 01/15/2021 Stage prefix: Post-therapy Histologic grading system: 3 grade system   01/07/2021 Surgery   She underwent bilateral mastectomy on 01/07/2021 that showed: left breast no evidence of malignancy, and the right breast invasive ductal carcinoma grade 2, spanning fibrotic area of approximately 5 cm in patches.  Margins were negative.   (A) Microscopic metastatic carcinoma was noted in 1 out of 15 lymph nodes biopsied.   01/26/2021 - 10/14/2021 Chemotherapy   Patient is on Treatment Plan : BREAST ADO-Trastuzumab Emtansine  (Kadcyla ) q21d     02/13/2021 - 04/01/2021 Radiation Therapy   Site Technique Total Dose (Gy) Dose per Fx (Gy) Completed Fx Beam Energies  Chest Wall, Right: CW_R 3D 50.4/50.4 1.8 28/28 6XFFF, 10X  Chest Wall, Right: CW_R_SCLV 3D 50.4/50.4 1.8 28/28 6X, 10X  Chest Wall, Right: CW_R_Bst Electron 10/10 2 5/5  6E     04/08/2021 -  Anti-estrogen oral therapy   Antiestrogen therapy  with Zolodex and AI starting 04/21/21      MEDICAL HISTORY:  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)    Hx of varicella    Kidney stone 2012   Medullary thyroid  carcinoma (HCC)    Monoallelic mutation of CHEK2 gene in female patient    Postpartum care following vaginal delivery (7/27) 10/02/2014, June 16 , 2020   Second degree perineal laceration 08/23/2018   SVD (spontaneous vaginal delivery) 08/23/2018   Thyroid  cancer (HCC) 2021    SURGICAL HISTORY: Past Surgical History:  Procedure Laterality Date   IR IMAGING GUIDED PORT INSERTION  07/22/2020   IR REMOVAL TUN ACCESS W/ PORT W/O FL MOD SED  11/12/2021   MODIFIED MASTECTOMY Right 01/07/2021   Procedure: RIGHT MODIFIED RADICAL MASTECTOMY;  Surgeon: Caralyn Chandler, MD;  Location: [facility];  Service: General;  Laterality: Right;   NO PAST SURGERIES     SIMPLE MASTECTOMY WITH AXILLARY SENTINEL NODE BIOPSY Left 01/07/2021   Procedure: LEFT SIMPLE MASTECTOMY;  Surgeon: Caralyn Chandler, MD;  Location: [facility];  Service: General;  Laterality: Left;   THYROIDECTOMY N/A 04/12/2019   Procedure: TOTAL THYROIDECTOMY WITH LIMITED LYMPH NODE DISSECTION;  Surgeon: Oralee Billow, MD;  Location: [facility];  Service: General;  Laterality: N/A;   UNILATERAL SALPINGECTOMY Right 07/09/2017   Procedure: RIGHT SALPINGECTOMY WITH REMOVAL OF ECTOPIC PREGNANCY;  Surgeon: Terri Fester, MD;  Location: [facility];  Service: Gynecology;  Laterality: Right;    SOCIAL HISTORY: Social History   Socioeconomic History   Marital status: Married    Spouse name: Not on file   Number of children: Not on file   Years of education: Not  on file   Highest education level: Not on file  Occupational History   Occupation: homemaker  Tobacco Use   Smoking status: Some Days    Current packs/day: 0.50    Average packs/day: 0.5 packs/day for 5.0 years (2.5 ttl pk-yrs)    Types: Cigarettes   Smokeless tobacco: Never   Tobacco comments:    Actively trying to quit.  Vaping Use   Vaping status: Never Used  Substance and Sexual Activity   Alcohol use: Yes    Alcohol/week: 7.0 - 14.0 standard drinks of alcohol    Types: 7 - 14 Glasses of wine per week    Comment: glass of wine each night    Drug use: Not Currently    Types: Marijuana    Comment: not during pregnancy, last use was 03/27/2019   Sexual activity: Yes    Birth control/protection: None, I.U.D.  Other Topics Concern   Not on file  Social History Narrative   Not on file   Social Drivers of Health   Financial Resource Strain: Low Risk  (07/10/2020)   Overall Financial Resource Strain (CARDIA)    Difficulty of Paying Living Expenses: Not hard at all  Food Insecurity: No Food Insecurity (08/12/2023)   Hunger Vital Sign    Worried About Running Out of Food  in the Last Year: Never true    Ran Out of Food in the Last Year: Never true  Transportation Needs: No Transportation Needs (08/12/2023)   PRAPARE - Administrator, Civil Service (Medical): No    Lack of Transportation (Non-Medical): No  Physical Activity: Not on file  Stress: Stress Concern Present (08/11/2018)   [employer] of Occupational Health - Occupational Stress Questionnaire    Feeling of Stress : To some extent  Social Connections: Unknown (07/13/2021)   Received from [facility], [facility]   Social Network    Social Network: Not on file  Intimate Partner Violence: Not At Risk (08/12/2023)   Humiliation, Afraid, Rape, and Kick questionnaire    Fear of Current or Ex-Partner: No    Emotionally Abused: No    Physically Abused: No    Sexually Abused: No    FAMILY HISTORY: Family  History  Problem Relation Age of Onset   Breast cancer Mother 107       2 types of cancer one side mastectomy with chemo   Endometriosis Mother    Endometriosis Maternal Grandmother    Breast cancer Maternal Grandmother        post menopausal   Diabetes Maternal Grandmother    Cancer Maternal Grandfather        prostate and lymphoma   Alzheimer's disease Paternal Grandmother    Cancer Father 66       prostate cancer   Aneurysm Paternal Grandfather        d. 30s    ALLERGIES:  has no known allergies.  MEDICATIONS:  Current Facility-Administered Medications  Medication Dose Route Frequency Provider Last Rate Last Admin   dexamethasone  (DECADRON ) tablet 4 mg  4 mg Oral Q8H Perkins, Erskine Heart, PA-C       iohexol (OMNIPAQUE) 9 MG/ML oral solution 500 mL  500 mL Oral Q1H Rouson, Lisa J, NP       levETIRAcetam (KEPPRA) undiluted injection 1,250 mg  1,250 mg Intravenous Q12H Yadav, Priyanka O, MD       levothyroxine  (SYNTHROID ) tablet 175 mcg  175 mcg Oral Q0600 Moore, Willie, MD   175 mcg at 08/12/23 0534   lisdexamfetamine (VYVANSE ) capsule 60 mg  60 mg Oral q morning Arne Langdon, MD   60 mg at 08/12/23 0836   LORazepam  (ATIVAN ) tablet 0.5 mg  0.5 mg Oral Q12H PRN Moore, Willie, MD   0.5 mg at 08/11/23 2251   sertraline  (ZOLOFT ) tablet 50 mg  50 mg Oral QHS Arne Langdon, MD   50 mg at 08/11/23 2113   Facility-Administered Medications Ordered in Other Encounters  Medication Dose Route Frequency Provider Last Rate Last Admin   sodium chloride  flush (NS) 0.9 % injection 10 mL  10 mL Intravenous PRN Magrinat, Rozella Cornfield, MD        REVIEW OF SYSTEMS:   Constitutional: Denies fevers, chills or abnormal night sweats No issues for currently with speech All other systems were reviewed with the patient and are negative.  PHYSICAL EXAMINATION: ECOG PERFORMANCE STATUS: 1 - Symptomatic but completely ambulatory  Vitals:   08/12/23 0350 08/12/23 0728  BP: 101/67   Pulse:    Resp:     Temp: 97.8 F (36.6 C) (P) 98.6 F (37 C)  SpO2:     Filed Weights   08/11/23 1100  Weight: 173 lb 11.6 oz (78.8 kg)    GENERAL:alert, no distress and comfortable    LABORATORY DATA:  I have reviewed the  data as listed Lab Results  Component Value Date   WBC 10.2 08/11/2023   HGB 14.3 08/11/2023   HCT 42.0 08/11/2023   MCV 101.2 (H) 08/11/2023   PLT 327 08/11/2023   Lab Results  Component Value Date   NA 136 08/11/2023   K 4.3 08/11/2023   CL 103 08/11/2023   CO2 20 (L) 08/11/2023    RADIOGRAPHIC STUDIES: I have personally reviewed the radiological reports and agreed with the findings in the report.  ASSESSMENT AND PLAN:   Metastatic breast cancer: Several brain metastases were identified.  2 in the left thalamus, 1 in the right cerebral hemisphere at the parieto-occipital junction and 1 in the left cerebral hemisphere at the posterior temporoparietal occipital junction and 1 at the posterior midline cerebellum possibly second lesion in the right cerebellar hemisphere. Patient was started on Decadron  4 mg p.o. 3 times daily. CT chest abdomen pelvis has been ordered. Plan: Obtain a 3T MRI for [facility] planning. Consult neurosurgery Depending on the CT scan findings we will assess if any biopsies could be safely performed. Patient has a trip to [place] on 08/22/2023.  This is to celebrate her daughter's fifth birthday.  Therefore plan is to finish with radiation so that she can go on this trip.  Once she gets the scans performed, patient could be discharged home on Decadron  4 mg p.o. 3 times daily.  This will be tapered off after the [facility] is completed. Systemic therapy plans will be decided once we have additional information.  I also informed her the prognosis discussion will be more meaningful to her once I have the full picture.   All questions were answered. The patient knows to call the clinic with any problems, questions or concerns.    Viinay K Janith Nielson, MD @T @

## 2023-08-12 NOTE — TOC Transition Note (Signed)
 Transition of Care [facility]) - Discharge Note   Patient Details  Name: Kayla Little MRN: [id number] Date of Birth: March 13, 1985  [facility] of [facility] CM/SW Contact:  Jonathan Neighbor, RN Phone Number: 08/12/2023, 1:24 PM   Clinical Narrative:     Pt is discharging home with self care. No needs per TOC.  Final next level of care: Home/Self Care Barriers to Discharge: No Barriers Identified   Patient Goals and CMS Choice            Discharge Placement                       Discharge Plan and Services Additional resources added to the After Visit Summary for                                       Social Drivers of Health (SDOH) Interventions SDOH Screenings   Food Insecurity: No Food Insecurity (08/12/2023)  Housing: Unknown (08/12/2023)  Transportation Needs: No Transportation Needs (08/12/2023)  Utilities: Not At Risk (08/12/2023)  Financial Resource Strain: Low Risk  (07/10/2020)  Social Connections: Unknown (07/13/2021)   Received from [facility]  Stress: Stress Concern Present (08/11/2018)  Tobacco Use: High Risk (02/08/2022)     Readmission Risk Interventions     No data to display

## 2023-08-12 NOTE — Assessment & Plan Note (Signed)
 Continue Synthroid

## 2023-08-12 NOTE — Progress Notes (Signed)
 DISCHARGE NOTE HOME LATIVIA VELIE to be discharged Home per MD order. Discussed prescriptions and follow up appointments with the patient. Prescriptions given to patient; medication list explained in detail. Patient verbalized understanding.  Skin clean, dry and intact without evidence of skin break down, no evidence of skin tears noted. IV catheter discontinued intact. Site without signs and symptoms of complications. Dressing and pressure applied. Pt denies pain at the site currently. No complaints noted.  Patient free of lines, drains, and wounds.   An After Visit Summary (AVS) was printed and given to the patient. Patient escorted via wheelchair, and discharged home via private auto.  Brentlee Delage K Braiden Rodman, RN

## 2023-08-12 NOTE — Progress Notes (Signed)
 Pt received at ED, assessment with outgoing dayshift RN, Aox4, follow commands, ambulatory. Oriented to room and surroundings, placed on tele monitoring, keep call bell within reach. EEG monitoring connected by EEG Technician, re orient pt on call bell and eeg button.

## 2023-08-12 NOTE — Consult Note (Signed)
 Radiation Oncology         [phone] ________________________________  Name: Kayla Little        MRN: [id number]  Date of Service: 08/12/23    DOB: 01/20/1986  IO:[id number], [facility] [place]  No ref. provider found     REFERRING PHYSICIAN: No ref. provider found   DIAGNOSIS: The primary encounter diagnosis was Aphasia. 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   HISTORY OF PRESENT ILLNESS: Kayla Little is a 38 y.o. female seen at the request of Dr. Gudena for newly diagnosed metastatic disease with a history of Stage IIIA, cT3N3M0 grade 1, triple positive invasive ductal carcinoma of the right breast.  She was treated with neoadjuvant chemotherapy followed by left mastectomy and adjuvant radiotherapy to the chest wall and regional nodes.  She received adjuvant Kadcyla  and antiestrogen therapy with Zoladex  but has had insinuating circumstances that have kept her from routine care in the last year and a half.  She presented to the emergency room with symptoms of aphasia yesterday and CT imaging of the head to rule out stroke showed concerns for lesions in the right parietal lobe and left temporoparietal junction with mild surrounding edema.  An MRI of the brain with and without contrast performed yesterday showed 5-6 metastatic lesions 2 in the left thalamus 1 in the right cerebral hemisphere at the parieto-occipital junction and 1 in the left cerebral hemisphere at the posterior temporal/parietal/occipital junction as well as 1 in the posterior midline cerebellum with the question of an additional lesion in the right cerebellum.  She was started on dexamethasone .  We have been asked to consider options of radiotherapy.  The patient underwent a CT chest abdomen pelvis today that shows no evidence of recurrence in the chest abdomen or pelvis.    PREVIOUS  RADIATION THERAPY: Yes    02/12/2021 through 04/01/2021 Site Technique Total Dose (Gy) Dose per Fx (Gy) Completed Fx Beam Energies  Chest Wall, Right: CW_R 3D 50.4/50.4 1.8 28/28 6XFFF, 10X  Chest Wall, Right: CW_R_SCLV 3D 50.4/50.4 1.8 28/28 6X, 10X  Chest Wall, Right: CW_R_Bst Electron 10/10 2 5/5 6E     PAST MEDICAL HISTORY:  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)    Hx of varicella    Kidney stone 2012   Medullary thyroid  carcinoma (HCC)    Monoallelic mutation of CHEK2 gene in female patient    Postpartum care following vaginal delivery (7/27) 10/02/2014, June 16 , 2020   Second degree perineal laceration 08/23/2018   SVD (spontaneous vaginal delivery) 08/23/2018   Thyroid  cancer (HCC) 2021       PAST SURGICAL HISTORY: Past Surgical History:  Procedure Laterality Date   IR IMAGING GUIDED PORT INSERTION  07/22/2020   IR REMOVAL TUN ACCESS W/ PORT W/O FL MOD SED  11/12/2021   MODIFIED MASTECTOMY Right 01/07/2021   Procedure: RIGHT MODIFIED RADICAL MASTECTOMY;  Surgeon: Caralyn Chandler, MD;  Location: [facility];  Service: General;  Laterality: Right;  NO PAST SURGERIES     SIMPLE MASTECTOMY WITH AXILLARY SENTINEL NODE BIOPSY Left 01/07/2021   Procedure: LEFT SIMPLE MASTECTOMY;  Surgeon: Caralyn Chandler, MD;  Location: [facility];  Service: General;  Laterality: Left;   THYROIDECTOMY N/A 04/12/2019   Procedure: TOTAL THYROIDECTOMY WITH LIMITED LYMPH NODE DISSECTION;  Surgeon: Oralee Billow, MD;  Location: [facility];  Service: General;  Laterality: N/A;   UNILATERAL SALPINGECTOMY Right 07/09/2017   Procedure: RIGHT SALPINGECTOMY WITH REMOVAL OF ECTOPIC  PREGNANCY;  Surgeon: Terri Fester, MD;  Location: [facility];  Service: Gynecology;  Laterality: Right;     FAMILY HISTORY:  Family History  Problem Relation Age of Onset   Breast cancer Mother 36       2 types of cancer one side mastectomy with chemo   Endometriosis Mother    Endometriosis Maternal Grandmother    Breast cancer Maternal Grandmother        post menopausal   Diabetes Maternal Grandmother    Cancer Maternal Grandfather        prostate and lymphoma   Alzheimer's disease Paternal Grandmother    Cancer Father 37       prostate cancer   Aneurysm Paternal Grandfather        d. 30s     SOCIAL HISTORY:  reports that she has been smoking cigarettes. She has a 2.5 pack-year smoking history. She has never used smokeless tobacco. She reports current alcohol use of about 7.0 - 14.0 standard drinks of alcohol per week. She reports that she does not currently use drugs after having used the following drugs: Marijuana.  The patient is married and lives in [place].  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  She has young children.   ALLERGIES: Patient has no known allergies.   MEDICATIONS:  Current Facility-Administered Medications  Medication Dose Route Frequency Provider Last Rate Last Admin   dexamethasone  (DECADRON ) tablet 4 mg  4 mg Oral Q8H Bettejane Brownie, PA-C   4 mg at 08/12/23 1438   levETIRAcetam (KEPPRA) undiluted injection 1,250 mg  1,250 mg Intravenous Q12H Yadav, Priyanka O, MD   1,250 mg at 08/12/23 0926   levothyroxine  (SYNTHROID ) tablet 175 mcg  175 mcg Oral Q0600 Moore, Willie, MD   175 mcg at 08/12/23 0534   lisdexamfetamine (VYVANSE ) capsule 60 mg  60 mg Oral q morning Moore, Willie, MD   60 mg at 08/12/23 0836   LORazepam  (ATIVAN ) tablet 0.5 mg  0.5 mg Oral Q12H PRN Moore, Willie, MD   0.5 mg at 08/11/23 2251   sertraline  (ZOLOFT ) tablet 50 mg  50 mg Oral QHS Arne Langdon, MD   50 mg at 08/11/23 2113   Current Outpatient  Medications  Medication Sig Dispense Refill   acetaminophen  (TYLENOL ) 325 MG tablet Take 2 tablets (650 mg total) by mouth every 4 (four) hours as needed (for pain scale < 4). 100 tablet 0   diazePAM, 20 MG Dose, 2 x 10 MG/0.1ML LQPK Place 20 mg into the nose once as needed for up to 1 dose. Use for seizure lasting greater than 2 minutes 2 each 0   levETIRAcetam (KEPPRA) 1000 MG tablet Take 1 tablet (1,000 mg total) by mouth 2 (two) times daily. Take with Keppra 250 mg tablet for total dose of 1,250 mg 60 tablet 3   levETIRAcetam (KEPPRA) 250 MG tablet Take 1 tablet (250 mg total) by mouth 2 (two) times daily. Take with Keppra 1,000  mg tablet for total dose of 1,250 mg 60 tablet 3   levonorgestrel (MIRENA) 20 MCG/DAY IUD 1 each by Intrauterine route once.     levothyroxine  (SYNTHROID ) 175 MCG tablet Take 175 mcg by mouth daily before breakfast.     LORazepam  (ATIVAN ) 0.5 MG tablet Take 1 tablet (0.5 mg total) by mouth every 12 (twelve) hours as needed for anxiety. (Patient taking differently: Take 0.5 mg by mouth daily as needed for anxiety.) 30 tablet 0   sertraline  (ZOLOFT ) 50 MG tablet Take 50 mg by mouth at bedtime.     VYVANSE  60 MG capsule Take 60 mg by mouth every morning.     dexamethasone  (DECADRON ) 4 MG tablet Take 1 tablet (4 mg total) by mouth 3 (three) times daily. 90 tablet 1   Facility-Administered Medications Ordered in Other Encounters  Medication Dose Route Frequency Provider Last Rate Last Admin   sodium chloride  flush (NS) 0.9 % injection 10 mL  10 mL Intravenous PRN Magrinat, Gustav C, MD         REVIEW OF SYSTEMS: On review of systems, the patient reports that she is doing okay.  She reports that she is tolerating the Keppra that she was started on by the inpatient neurology team.  She is tolerating steroids at this time as well.  No other complaints are verbalized at this time but she states that she is no longer experiencing aphasia or difficulty with communicating.      PHYSICAL EXAM:  Wt Readings from Last 3 Encounters:  08/11/23 173 lb 11.6 oz (78.8 kg)  02/04/22 158 lb 8 oz (71.9 kg)  11/10/21 160 lb 4.8 oz (72.7 kg)   Temp Readings from Last 3 Encounters:  08/12/23 97.9 F (36.6 C) (Oral)  02/04/22 (!) 97.2 F (36.2 C) (Temporal)  01/05/22 98.5 F (36.9 C) (Oral)   BP Readings from Last 3 Encounters:  08/12/23 107/71  02/04/22 128/72  01/05/22 (!) 134/90   Pulse Readings from Last 3 Encounters:  08/12/23 82  02/04/22 87  01/05/22 97   Pain Assessment Pain Score: 0-No pain/10  Unable to assess given encounter type.   ECOG = 1  0 - Asymptomatic (Fully active, able to carry on all predisease activities without restriction)  1 - Symptomatic but completely ambulatory (Restricted in physically strenuous activity but ambulatory and able to carry out work of a light or sedentary nature. For example, light housework, office work)  2 - Symptomatic, <50% in bed during the day (Ambulatory and capable of all self care but unable to carry out any work activities. Up and about more than 50% of waking hours)  3 - Symptomatic, >50% in bed, but not bedbound (Capable of only limited self-care, confined to bed or chair 50% or more of waking hours)  4 - Bedbound (Completely disabled. Cannot carry on any self-care. Totally confined to bed or chair)  5 - Death   Aurea Blossom MM, Creech RH, Tormey DC, et al. [phone]). "Toxicity and response criteria of the [facility]". Am. Hillard Lowes. Oncol. 5 (6): 649-55    LABORATORY DATA:  Lab Results  Component Value Date   WBC 10.2 08/11/2023   HGB 14.3 08/11/2023   HCT 42.0 08/11/2023   MCV 101.2 (H) 08/11/2023   PLT 327 08/11/2023   Lab Results  Component Value Date   NA 136 08/11/2023   K 4.3 08/11/2023   CL 103 08/11/2023   CO2 20 (L) 08/11/2023   Lab Results  Component  Value Date   ALT 24 08/11/2023   AST 29 08/11/2023   ALKPHOS 45 08/11/2023   BILITOT 0.7 08/11/2023       RADIOGRAPHY: CT CHEST ABDOMEN PELVIS W CONTRAST Result Date: 08/12/2023 CLINICAL DATA:  Metastatic breast cancer, assess treatment response * Tracking Code: BO * EXAM: CT CHEST, ABDOMEN, AND PELVIS WITH CONTRAST TECHNIQUE: Multidetector CT imaging of the chest, abdomen and pelvis was performed following the standard protocol during bolus administration of intravenous contrast. RADIATION DOSE REDUCTION: This exam was performed according to the departmental dose-optimization program which includes automated exposure control, adjustment of the mA and/or kV according to patient size and/or use of iterative reconstruction technique. CONTRAST:  75mL OMNIPAQUE IOHEXOL 350 MG/ML SOLN COMPARISON:  PET-CT, 07/21/2020 FINDINGS: CT CHEST FINDINGS Cardiovascular: No significant vascular findings. Normal heart size. No pericardial effusion. Mediastinum/Nodes: No enlarged mediastinal, hilar, or axillary lymph nodes. Thyroidectomy. Trachea, and esophagus demonstrate no significant findings. 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 Musculoskeletal: Status post bilateral mastectomy with axillary lymph node dissection. No acute osseous findings. CT ABDOMEN PELVIS FINDINGS Hepatobiliary: No solid liver abnormality is seen. Hepatic steatosis. No gallstones, gallbladder wall thickening, or biliary dilatation. Pancreas: Unremarkable. No pancreatic ductal dilatation or surrounding inflammatory changes. Spleen: Normal in size without significant abnormality. Adrenals/Urinary Tract: Adrenal glands are unremarkable. Kidneys are normal, without renal calculi, solid lesion, or hydronephrosis. Bladder is unremarkable. Stomach/Bowel: Stomach is within normal limits. Appendix appears normal. Diffuse wall thickening and mucosal hyperenhancement of the decompressed descending colon, sigmoid colon, and rectum (series 6, image 44, series 3, image 93).  Vascular/Lymphatic: No significant vascular findings are present. No enlarged abdominal or pelvic lymph nodes. Reproductive: No mass or other abnormality. IUD present in the fundal endometrial cavity. Other: No abdominal wall hernia or abnormality. No ascites. Musculoskeletal: No acute osseous findings. IMPRESSION: 1. Status post bilateral mastectomy with axillary lymph node dissection. 2. No evidence of lymphadenopathy or metastatic disease in the chest, abdomen, or pelvis. 3. Diffuse wall thickening and mucosal hyperenhancement of the decompressed descending colon, sigmoid colon, and rectum, consistent with nonspecific infectious or inflammatory colitis. 4. Hepatic steatosis. Electronically Signed   By: Fredricka Jenny M.D.   On: 08/12/2023 12:30   Overnight EEG with video Result Date: 08/12/2023 Arleene Lack, MD     08/12/2023  6:59 AM Patient Name: Kayla Little MRN: [id number] Epilepsy Attending: Arleene Lack Referring Physician/Provider: Laymond Priestly, NP Duration: 08/11/2023 1334 to 08/12/2023 4098 Patient history: 38 y.o. female  hx of breast cancer, thyroid  cancer s/p thyroidectomy ADD, ocular migraines presented from home via EMS for an episode of acute aphasia.  EEG to evaluate for seizure Level of alertness: Awake, asleep AEDs during EEG study: LEV Technical aspects: This EEG study was done with scalp electrodes positioned according to the 10-20 International system of electrode placement. Electrical activity was reviewed with band pass filter of 1-70Hz , sensitivity of 7 uV/mm, display speed of 3mm/sec with a 60Hz  notched filter applied as appropriate. EEG data were recorded continuously and digitally stored.  Video monitoring was available and reviewed as appropriate. Description: The posterior dominant rhythm consists of 9Hz  activity of moderate voltage (25-35 uV) seen predominantly in posterior head regions, symmetric and reactive to eye opening and eye closing. Sleep was characterized by vertex  waves, sleep spindles (12 to 14 Hz), maximal frontocentral region. Hyperventilation and photic stimulation were not performed.   IMPRESSION: This study is within normal limits.  No seizures or epileptiform discharges were seen throughout the recording. A normal interictal EEG does not exclude the diagnosis of epilepsy. Arleene Lack   MR BRAIN W WO CONTRAST Result Date: 08/11/2023 CLINICAL DATA:  Metastatic disease evaluation. Right frontoparietal mass. History of breast cancer. Abnormal head CT. EXAM: MRI HEAD WITHOUT AND WITH CONTRAST TECHNIQUE: Multiplanar, multiecho pulse sequences of the brain and surrounding structures were obtained without and with intravenous contrast. CONTRAST:  7.5mL GADAVIST  GADOBUTROL  1 MMOL/ML IV SOLN COMPARISON:  Head CT same day FINDINGS: Brain: Diffusion imaging does not show any acute or subacute infarction. There are 2 punctate metastases noted within the left thalamus, the larger measuring 3 mm, axial image 50. Smaller adjacent punctate metastasis visible on the sagittal postcontrast images. There is an 8 mm enhancing metastasis within the posterosuperior midline cerebellum without edema or hemorrhage. In the right cerebellar hemisphere more laterally, FLAIR image 10 and postcontrast coronal image 10, there is question of a subtle additional right cerebellar metastasis. This is not confirmed on the axial or sagittal images. Right cerebral hemisphere shows a 14 mm in diameter metastasis at the parietooccipital junction with regional vasogenic edema. No hemorrhage or necrosis. In the left hemisphere, other than the thalamic lesions, there is a 15 mm metastasis at the posterior temporal/parietal/occipital junction with surrounding vasogenic edema. No brain shift. No hydrocephalus. No extra-axial collection. No sign of widespread leptomeningeal enhancement. Vascular: Major vessels at the base of the brain show flow. Skull and upper cervical spine: Negative Sinuses/Orbits:  Clear/normal Other: None IMPRESSION: 1. 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 No hemorrhage . No brain shift or hydrocephalus. Electronically Signed   By: Bettylou Brunner M.D.   On: 08/11/2023 12:55   CT HEAD CODE STROKE WO CONTRAST Result Date: 08/11/2023 CLINICAL DATA:  Code stroke. Neuro deficit, acute, stroke suspected. EXAM: CT HEAD WITHOUT CONTRAST TECHNIQUE: Contiguous axial images were obtained from the base of the skull through the vertex without intravenous contrast. RADIATION DOSE REDUCTION: This exam was performed according to the departmental dose-optimization program which includes automated exposure control, adjustment of the mA and/or kV according to patient size and/or use of iterative reconstruction technique. COMPARISON:  None. FINDINGS: Brain: No age-advanced or lobar predominant cerebral atrophy. 16 mm lesion within the right parietal lobe with mild surrounding vasogenic edema (for instance as seen on series 7, image 33) (series 3, image 24). 15 mm lesion at the left temporoparietal junction with mild surrounding edema (best appreciated on series 6, image 27). These lesions are somewhat hyperdense and this may reflect associated hemorrhage. No demarcated cortical infarct. No extra-axial fluid collection. No midline shift. Vascular: No hyperdense vessel. Skull: No calvarial fracture or aggressive osseous lesion. Sinuses/Orbits: No mass or acute finding within the imaged orbits. No significant paranasal sinus disease at the imaged levels. ASPECTS (Alberta Stroke Program Early CT Score) - Ganglionic level infarction (caudate, lentiform nuclei, internal  capsule, insula, M1-M3 cortex): 7 - Supraganglionic infarction (M4-M6 cortex): 3 Total score (0-10 with 10 being normal): 10 Study discussed by telephone at the time of interpretation on 08/11/2023 at 11:12 am with provider Dr. Doretta Gant. IMPRESSION: 1. Lesions within the right parietal lobe (16 mm) and at the left temporoparietal  junction (15 mm) with mild surrounding edema. 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 The lesions are somewhat hyperdense, and this may reflect associated hemorrhage. A brain MRI (with and without contrast) is recommended for further evaluation. 2. No evidence of an acute infarct. Electronically Signed   By: Bascom Lily D.O.   On: 08/11/2023 11:22       IMPRESSION/PLAN: 1. Recurrent metastatic stage IIIA, [id number] grade 1, triple positive invasive ductal carcinoma of the right breast involving the brain.  Dr. Jeryl Moris has reviewed her imaging.  Initially I had discussed her case with Dr. Lee Public we were trying to coordinate inpatient workup urgently to work around an upcoming trip however after discussing with Dr. Jeryl Moris and reviewing her needs for 3T MRI, simulation evaluation with neurosurgery and a fractionated course of stereotactic radiosurgery, we discussed that it would be better for her to proceed with her workup needed for stereotactic radiosurgery in the outpatient setting.  I discussed the risks, benefits, short and long-term effects of radiotherapy with the patient and she is interested in proceeding, but is also in agreement that she would like to proceed with her upcoming trip, her medical team feel that this is reasonable given the size of the lesions and stability expected with steroids.  She is in agreement to take these while she is on her vacation, and to report any symptoms to us .  We anticipate 3T MRI scan, neurosurgery consultation which we outlined the rationale for, and simulation in our department next week and to plan to begin treatment on  08/30/2023 when she returns from a planned vacation.  If her symptoms however were to worsen in the next week prior to leaving [place]  would revisit this timeline.   In a visit lasting 66 minutes, greater than 50% of the time was spent with phone and and floor time discussing the patient's condition, in preparation for the discussion, and coordinating the patient's care.       Shelvia Dick, [facility]   **Disclaimer: This note was dictated with voice recognition software. Similar sounding words can inadvertently be transcribed and this note may contain transcription errors which may not have been corrected upon publication of note.**

## 2023-08-12 NOTE — Plan of Care (Signed)
   Problem: Education: Goal: Knowledge of General Education information will improve Description Including pain rating scale, medication(s)/side effects and non-pharmacologic comfort measures Outcome: Progressing

## 2023-08-12 NOTE — Assessment & Plan Note (Signed)
 Continue Zoloft

## 2023-08-12 NOTE — Assessment & Plan Note (Signed)
 Continue Vyvanse

## 2023-08-12 NOTE — Plan of Care (Signed)
  Problem: Education: Goal: Knowledge of General Education information will improve Description: Including pain rating scale, medication(s)/side effects and non-pharmacologic comfort measures Outcome: Progressing   Problem: Clinical Measurements: Goal: Will remain free from infection Outcome: Progressing   Problem: Clinical Measurements: Goal: Diagnostic test results will improve Outcome: Progressing   Problem: Nutrition: Goal: Adequate nutrition will be maintained Outcome: Progressing   Problem: Coping: Goal: Level of anxiety will decrease Outcome: Progressing   Problem: Safety: Goal: Ability to remain free from injury will improve Outcome: Progressing

## 2023-08-12 NOTE — Assessment & Plan Note (Signed)
-   Stroke workup negative.  Symptoms felt to be due to presumed seizure in setting of underlying metastatic brain lesions -EEG performed and evaluated by neurology.  Recommended to continue on Keppra 1250 mg BID at discharge  - valtoco PRN for breakthrough seizures

## 2023-08-12 NOTE — Consult Note (Signed)
 Providing Compassionate, Quality Care - Together   Reason for Consult: Brain metastasis Referring Physician: Dr. Janifer Meigs Kayla Little is an 38 y.o. female.  HPI: Kayla Little is a 38 year old female with a history of breast cancer, who presented to the emergency department with word finding difficulty. A head CT scan and brain MRI were performed revealing five metastatic lesions. [name] was started on decadron  and Keppra. [name] presently denies any symptoms. Kayla Little was speaking with Margretta Shi of Radiation Oncology prior to this assessment. Neurosurgery was consulted by the Hospitalist team for further evaluation and recommendations.  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    Postpartum care following vaginal delivery (7/27) 10/02/2014, June 16 , 2020   Second degree perineal laceration 08/23/2018   SVD (spontaneous vaginal delivery) 08/23/2018   Thyroid  cancer (HCC) 2021    Past Surgical History:  Procedure Laterality Date   IR IMAGING GUIDED PORT INSERTION  07/22/2020   IR REMOVAL TUN ACCESS W/ PORT W/O FL MOD SED  11/12/2021   MODIFIED MASTECTOMY Right 01/07/2021   Procedure: RIGHT MODIFIED RADICAL MASTECTOMY;  Surgeon: Caralyn Chandler, MD;  Location: [facility];  Service: General;  Laterality: Right;   NO PAST SURGERIES     SIMPLE MASTECTOMY WITH AXILLARY SENTINEL NODE BIOPSY Left 01/07/2021   Procedure: LEFT SIMPLE MASTECTOMY;  Surgeon: Caralyn Chandler, MD;  Location: [facility];  Service: General;   Laterality: Left;   THYROIDECTOMY N/A 04/12/2019   Procedure: TOTAL THYROIDECTOMY WITH LIMITED LYMPH NODE DISSECTION;  Surgeon: Oralee Billow, MD;  Location: [facility];  Service: General;  Laterality: N/A;   UNILATERAL SALPINGECTOMY Right 07/09/2017   Procedure: RIGHT SALPINGECTOMY WITH REMOVAL OF ECTOPIC PREGNANCY;  Surgeon: Terri Fester, MD;  Location: [facility];  Service: Gynecology;  Laterality: Right;    Family History  Problem Relation Age of Onset   Breast cancer Mother 94       2 types of cancer one side mastectomy with chemo   Endometriosis Mother    Endometriosis Maternal Grandmother    Breast cancer Maternal Grandmother        post menopausal   Diabetes Maternal Grandmother    Cancer Maternal Grandfather        prostate and lymphoma   Alzheimer's disease Paternal Grandmother    Cancer Father 59       prostate cancer   Aneurysm Paternal Grandfather        d. 30s    Social History:  reports that she has been smoking cigarettes. She has a 2.5 pack-year smoking history. She has never used smokeless tobacco. She reports current alcohol use of about 7.0 - 14.0 standard drinks of alcohol per week. She reports that she does not currently use drugs after having used the following drugs: Marijuana.  Allergies: No Known Allergies  Medications: I have reviewed [name]'s current medications.  Results for orders  placed or performed during the hospital encounter of 08/11/23 (from the past 48 hours)  CBG monitoring, ED     Status: Abnormal   Collection Time: 08/11/23 11:03 AM  Result Value Ref Range   Glucose-Capillary 111 (H) 70 - 99 mg/dL    Comment: Glucose reference range applies only to samples taken after fasting for at least 8 hours.  Ethanol     Status: None   Collection Time: 08/11/23 11:08 AM  Result Value Ref Range   Alcohol, Ethyl (B) <15 <15 mg/dL    Comment: (NOTE) For medical purposes only. Performed at [facility], [address], [place], [place] [address]    Protime-INR     Status: None   Collection Time: 08/11/23 11:08 AM  Result Value Ref Range   Prothrombin Time 12.5 11.4 - 15.2 seconds   INR 0.9 0.8 - 1.2    Comment: (NOTE) INR goal varies based on device and disease states. Performed at [facility], [address], [place], [place] [address]   APTT     Status: None   Collection Time: 08/11/23 11:08 AM  Result Value Ref Range   aPTT 31 24 - 36 seconds    Comment: Performed at [facility], [address] [place], [place] [address]  CBC     Status: Abnormal   Collection Time: 08/11/23 11:08 AM  Result Value Ref Range   WBC 10.2 4.0 - 10.5 K/uL   RBC 4.06 3.87 - 5.11 MIL/uL   Hemoglobin 13.7 12.0 - 15.0 g/dL   HCT 91.4 78.2 - 95.6 %   MCV 101.2 (H) 80.0 - 100.0 fL   MCH 33.7 26.0 - 34.0 pg   MCHC 33.3 30.0 - 36.0 g/dL   RDW 21.3 08.6 - 57.8 %   Platelets 327 150 - 400 K/uL   nRBC 0.0 0.0 - 0.2 %    Comment: Performed at [facility], [address] [place], [place] [address]  Differential     Status: None   Collection Time: 08/11/23 11:08 AM  Result Value Ref Range   Neutrophils Relative % 65 %   Neutro Abs 6.6 1.7 - 7.7 K/uL   Lymphocytes Relative 24 %   Lymphs Abs 2.4 0.7 - 4.0 K/uL   Monocytes Relative 9 %   Monocytes Absolute 0.9 0.1 - 1.0 K/uL   Eosinophils Relative 1 %   Eosinophils Absolute 0.1 0.0 - 0.5 K/uL   Basophils Relative 1 %   Basophils Absolute 0.1 0.0 - 0.1 K/uL   Immature Granulocytes 0 %   Abs Immature Granulocytes 0.04 0.00 - 0.07 K/uL    Comment: Performed at [facility], [address], [place], [place] [address]  Comprehensive metabolic panel     Status: Abnormal   Collection Time: 08/11/23 11:08 AM  Result Value Ref Range   Sodium 134 (L) 135 - 145 mmol/L   Potassium 4.2 3.5 - 5.1 mmol/L   Chloride 103 98 - 111 mmol/L   CO2 20 (L) 22 - 32 mmol/L   Glucose, Bld 105 (H) 70 - 99 mg/dL    Comment: Glucose reference range applies only to samples taken after fasting  for at least 8 hours.   BUN 10 6 - 20 mg/dL   Creatinine, Ser 1.32 (H) 0.44 - 1.00 mg/dL   Calcium  8.6 (L) 8.9 - 10.3 mg/dL   Total Protein 6.9 6.5 - 8.1 g/dL   Albumin  3.9 3.5 -  5.0 g/dL   AST 29 15 - 41 U/L   ALT 24 0 - 44 U/L   Alkaline Phosphatase 45 38 - 126 U/L   Total Bilirubin 0.7 0.0 - 1.2 mg/dL   GFR, Estimated >09 >81 mL/min    Comment: (NOTE) Calculated using the CKD-EPI Creatinine Equation (2021)    Anion gap 11 5 - 15    Comment: Performed at [facility], [address] [place], [place] [address]  hCG, serum, qualitative     Status: None   Collection Time: 08/11/23 11:08 AM  Result Value Ref Range   Preg, Serum NEGATIVE NEGATIVE    Comment:        THE SENSITIVITY OF THIS METHODOLOGY IS >10 mIU/mL. Performed at [facility], [address] [place], [place] [address]   I-stat chem 8, ED     Status: Abnormal   Collection Time: 08/11/23 11:09 AM  Result Value Ref Range   Sodium 136 135 - 145 mmol/L   Potassium 4.3 3.5 - 5.1 mmol/L   Chloride 103 98 - 111 mmol/L   BUN 10 6 - 20 mg/dL   Creatinine, Ser 2.13 0.44 - 1.00 mg/dL   Glucose, Bld 086 (H) 70 - 99 mg/dL    Comment: Glucose reference range applies only to samples taken after fasting for at least 8 hours.   Calcium , Ion 1.01 (L) 1.15 - 1.40 mmol/L   TCO2 21 (L) 22 - 32 mmol/L   Hemoglobin 14.3 12.0 - 15.0 g/dL   HCT 57.8 46.9 - 62.9 %  HIV Antibody (routine testing w rflx)     Status: None   Collection Time: 08/12/23  3:51 AM  Result Value Ref Range   HIV Screen 4th Generation wRfx Non Reactive Non Reactive    Comment: Performed at [facility], [address], [place], [place] [address]    CT CHEST ABDOMEN PELVIS W CONTRAST Result Date: 08/12/2023 CLINICAL DATA:  Metastatic breast cancer, assess treatment response * Tracking Code: BO * EXAM: CT CHEST, ABDOMEN, AND PELVIS WITH CONTRAST TECHNIQUE: Multidetector CT imaging of the chest, abdomen and pelvis was performed following the  standard protocol during bolus administration of intravenous contrast. RADIATION DOSE REDUCTION: This exam was performed according to the departmental dose-optimization program which includes automated exposure control, adjustment of the mA and/or kV according to Kayla size and/or use of iterative reconstruction technique. CONTRAST:  75mL OMNIPAQUE IOHEXOL 350 MG/ML SOLN COMPARISON:  PET-CT, 07/21/2020 FINDINGS: CT CHEST FINDINGS Cardiovascular: No significant vascular findings. Normal heart size. No pericardial effusion. Mediastinum/Nodes: No enlarged mediastinal, hilar, or axillary lymph nodes. Thyroidectomy. Trachea, and esophagus demonstrate no significant findings. 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 Musculoskeletal: Status post bilateral mastectomy with axillary lymph node dissection. No acute osseous findings. CT ABDOMEN PELVIS FINDINGS Hepatobiliary: No solid liver abnormality is seen. Hepatic steatosis. No gallstones, gallbladder wall thickening, or biliary dilatation. Pancreas: Unremarkable. No pancreatic ductal dilatation or surrounding inflammatory changes. Spleen: Normal in size without significant abnormality. Adrenals/Urinary Tract: Adrenal glands are unremarkable. Kidneys are normal, without renal calculi, solid lesion, or hydronephrosis. Bladder is unremarkable. Stomach/Bowel: Stomach is within normal limits. Appendix appears normal. Diffuse wall thickening and mucosal hyperenhancement of the decompressed descending colon, sigmoid colon, and rectum (series 6, image 44, series 3, image 93). Vascular/Lymphatic: No significant vascular findings are present. No enlarged abdominal or pelvic lymph nodes. Reproductive: No mass or other abnormality. IUD  present in the fundal endometrial cavity. Other: No abdominal wall hernia or abnormality. No ascites. Musculoskeletal: No acute osseous findings. IMPRESSION: 1. Status  post bilateral mastectomy with axillary lymph node dissection. 2. No evidence of lymphadenopathy or metastatic disease in the chest, abdomen, or pelvis. 3. Diffuse wall thickening and mucosal hyperenhancement of the decompressed descending colon, sigmoid colon, and rectum, consistent with nonspecific infectious or inflammatory colitis. 4. Hepatic steatosis. Electronically Signed   By: Fredricka Jenny M.D.   On: 08/12/2023 12:30   Overnight EEG with video Result Date: 08/12/2023 Kayla Lack, MD     08/12/2023  6:59 AM Kayla Little MRN: [id number] Epilepsy Attending: Arleene Little Referring Physician/Provider: Laymond Priestly, NP Duration: 08/11/2023 1334 to 08/12/2023 4132 Kayla history: 38 y.o. female  hx of breast cancer, thyroid  cancer s/p thyroidectomy ADD, ocular migraines presented from home via EMS for an episode of acute aphasia.  EEG to evaluate for seizure Level of alertness: Awake, asleep AEDs during EEG study: LEV Technical aspects: This EEG study was done with scalp electrodes positioned according to the 10-20 International system of electrode placement. Electrical activity was reviewed with band pass filter of 1-70Hz , sensitivity of 7 uV/mm, display speed of 36mm/sec with a 60Hz  notched filter applied as appropriate. EEG data were recorded continuously and digitally stored.  Video monitoring was available and reviewed as appropriate. Description: The posterior dominant rhythm consists of 9Hz  activity of moderate voltage (25-35 uV) seen predominantly in posterior head regions, symmetric and reactive to eye opening and eye closing. Sleep was characterized by vertex waves, sleep spindles (12 to 14 Hz), maximal frontocentral region. Hyperventilation and photic stimulation were not performed.   IMPRESSION: This study is within normal limits. No seizures or epileptiform discharges were seen throughout the recording. A normal interictal EEG does not exclude the diagnosis of epilepsy. Kayla Little   MR BRAIN W WO CONTRAST Result Date: 08/11/2023 CLINICAL DATA:  Metastatic disease evaluation. Right frontoparietal mass. History of breast cancer. Abnormal head CT. EXAM: MRI HEAD WITHOUT AND WITH CONTRAST TECHNIQUE: Multiplanar, multiecho pulse sequences of the brain and surrounding structures were obtained without and with intravenous contrast. CONTRAST:  7.5mL GADAVIST  GADOBUTROL  1 MMOL/ML IV SOLN COMPARISON:  Head CT same day FINDINGS: Brain: Diffusion imaging does not show any acute or subacute infarction. There are 2 punctate metastases noted within the left thalamus, the larger measuring 3 mm, axial image 50. Smaller adjacent punctate metastasis visible on the sagittal postcontrast images. There is an 8 mm enhancing metastasis within the posterosuperior midline cerebellum without edema or hemorrhage. In the right cerebellar hemisphere more laterally, FLAIR image 10 and postcontrast coronal image 10, there is question of a subtle additional right cerebellar metastasis. This is not confirmed on the axial or sagittal images. Right cerebral hemisphere shows a 14 mm in diameter metastasis at the parietooccipital junction with regional vasogenic edema. No hemorrhage or necrosis. In the left hemisphere, other than the thalamic lesions, there is a 15 mm metastasis at the posterior temporal/parietal/occipital junction with surrounding vasogenic edema. No brain shift. No hydrocephalus. No extra-axial collection. No sign of widespread leptomeningeal enhancement. Vascular: Major vessels at the base of the brain show flow. Skull and upper cervical spine: Negative Sinuses/Orbits: Clear/normal Other: None IMPRESSION: 1. Five or possibly six metastatic lesions are identified, 2 in the left thalamus, 1 in the right cerebral hemisphere at the parietooccipital junction, 1 in the left cerebral hemisphere at the posterior temporal/parietal/occipital junction, 1 in the  posterior midline cerebellum and questionable or  early evidence of a second lesion in the right cerebellar hemisphere as discussed above. No hemorrhage . No brain shift or hydrocephalus. Electronically Signed   By: Bettylou Brunner M.D.   On: 08/11/2023 12:55   CT HEAD CODE STROKE WO CONTRAST Result Date: 08/11/2023 CLINICAL DATA:  Code stroke. Neuro deficit, acute, stroke suspected. EXAM: CT HEAD WITHOUT CONTRAST TECHNIQUE: Contiguous axial images were obtained from the base of the skull through the vertex without intravenous contrast. RADIATION DOSE REDUCTION: This exam was performed according to the departmental dose-optimization program which includes automated exposure control, adjustment of the mA and/or kV according to Kayla size and/or use of iterative reconstruction technique. COMPARISON:  None. FINDINGS: Brain: No age-advanced or lobar predominant cerebral atrophy. 16 mm lesion within the right parietal lobe with mild surrounding vasogenic edema (for instance as seen on series 7, image 33) (series 3, image 24). 15 mm lesion at the left temporoparietal junction with mild surrounding edema (best appreciated on series 6, image 27). These lesions are somewhat hyperdense and this may reflect associated hemorrhage. No demarcated cortical infarct. No extra-axial fluid collection. No midline shift. Vascular: No hyperdense vessel. Skull: No calvarial fracture or aggressive osseous lesion. Sinuses/Orbits: No mass or acute finding within the imaged orbits. No significant paranasal sinus disease at the imaged levels. ASPECTS (Alberta Stroke Program Early CT Score) - Ganglionic level infarction (caudate, lentiform nuclei, internal capsule, insula, M1-M3 cortex): 7 - Supraganglionic infarction (M4-M6 cortex): 3 Total score (0-10 with 10 being normal): 10 Study discussed by telephone at the time of interpretation on 08/11/2023 at 11:12 am with provider Dr. Doretta Gant. IMPRESSION: 1. Lesions within the right parietal lobe (16 mm) and at the left temporoparietal junction (15  mm) with mild surrounding edema. 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 The lesions are somewhat hyperdense, and this may reflect associated hemorrhage. A brain MRI (with and without contrast) is recommended for further evaluation. 2. No evidence of an acute infarct. Electronically Signed   By: Bascom Lily D.O.   On: 08/11/2023 11:22    Review of Systems  Constitutional: Negative.   HENT: Negative.    Eyes: Negative.   Respiratory: Negative.    Cardiovascular: Negative.   Gastrointestinal: Negative.   Genitourinary: Negative.   Musculoskeletal: Negative.   Skin: Negative.   Neurological: Negative.   Endo/Heme/Allergies: Negative.   Psychiatric/Behavioral: Negative.     Blood pressure 107/71, pulse 82, temperature 97.9 F (36.6 C), temperature source Oral, resp. rate 18, weight 78.8 kg, SpO2 100%. Estimated body mass index is 28.04 kg/m as calculated from the following:   Height as of 11/10/21: 5\' 6"  (1.676 m).   Weight as of this encounter: 78.8 kg.  Physical Exam Constitutional:      Appearance: Normal appearance.  HENT:     Head: Normocephalic and atraumatic.     Nose: Nose normal.     Mouth/Throat:     Mouth: Mucous membranes are moist.     Pharynx: Oropharynx is clear.  Eyes:     Extraocular Movements: Extraocular movements intact.     Pupils: Pupils are equal, round, and reactive to light.  Cardiovascular:     Rate and Rhythm: Normal rate and regular rhythm.     Pulses: Normal pulses.  Pulmonary:     Effort: Pulmonary effort is normal.     Breath sounds: Normal breath sounds.  Abdominal:     General: Abdomen is flat.     Palpations:  Abdomen is soft.  Musculoskeletal:        General: Normal range of motion.     Cervical back: Normal range of motion.  Skin:    General: Skin is warm and dry.     Capillary Refill: Capillary refill takes less than 2 seconds.  Neurological:     General: No focal deficit present.     Mental Status: She is  alert.  Psychiatric:        Mood and Affect: Mood normal.        Behavior: Behavior normal.        Thought Content: Thought content normal.        Judgment: Judgment normal.     Assessment/Plan: Ms. Barrett has a history of breast cancer and now presents with metastatic lesions to her brain. Given the size and distribution of these lesions, stereotactic radiosurgery would be the most appropriate treatment. Dr. Nat Badger will see this Kayla as an outpatient next week. A member of his team will reach out to schedule this appointment later today.  I am in communication with my attending and they agree with the plan for this Kayla.   Henreitta Locus, DNP, AGNP-C Nurse Practitioner  [facility] [address], [place], [place] [address] P: [phone]    F: [phone]  08/12/2023, 12:42 PM

## 2023-08-12 NOTE — Progress Notes (Signed)
 Subjective: No further seizures except feeling off at times. Denies any side effects on keppra  ROS: negative except above  Examination  Vital signs in last 24 hours: Temp:  [97.7 F (36.5 C)-98.6 F (37 C)] 97.9 F (36.6 C) (06/06 1220) Pulse Rate:  [82-88] 82 (06/06 1220) Resp:  [17-18] 18 (06/06 1220) BP: (96-114)/(65-76) 107/71 (06/06 1220) SpO2:  [99 %-100 %] 100 % (06/06 1220)  General: lying in bed, NAD Neuro: MS: Alert, oriented, follows commands CN: pupils equal and reactive,  EOMI, face symmetric, tongue midline, normal sensation over face, Motor: 5/5 strength in all 4 extremities Coordination: normal Gait: not tested  Basic Metabolic Panel: Recent Labs  Lab 08/11/23 1108 08/11/23 1109  NA 134* 136  K 4.2 4.3  CL 103 103  CO2 20*  --   GLUCOSE 105* 105*  BUN 10 10  CREATININE 1.04* 0.90  CALCIUM  8.6*  --     CBC: Recent Labs  Lab 08/11/23 1108 08/11/23 1109  WBC 10.2  --   NEUTROABS 6.6  --   HGB 13.7 14.3  HCT 41.1 42.0  MCV 101.2*  --   PLT 327  --      Coagulation Studies: Recent Labs    08/11/23 1108  LABPROT 12.5  INR 0.9    Imaging personally reviewed  MR brain w and wo contrast 08/11/2023:  Five or possibly six metastatic lesions are identified, 2 in theleft thalamus, 1 in the right cerebral hemisphere at the parietooccipital junction, 1 in the left cerebral hemisphere at the posterior temporal/parietal/occipital junction, 1 in the posterior midline cerebellum and questionable or early evidence of a second lesion in the right cerebellar hemisphere as discussed above. No hemorrhage . No brain shift or hydrocephalus.  ASSESSMENT AND PLAN:  38 y.o. female  hx of breast cancer, thyroid  cancer s/p thyroidectomy ADD, ocular migraines presented from home via EMS for an episode of acute aphasia. She had 2 more episodes of aphasia (seizure) in the ED both of which resolved with Ativan .  She was loaded with 4.5 g of Keppra.  MRI brain showed  metastatic lesions.  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 Avoid working on ladders or at heights. Take showers instead of baths. Ensure the water temperature is not too high on the home water heater. Do not go swimming alone. Do not lock yourself in a room alone (i.e. bathroom). When caring for infants or small children, sit down when holding, feeding, or changing them to minimize risk of injury to the child in the event you have a seizure. Maintain good sleep hygiene. Avoid alcohol.    If patient has another seizure, call 911 and bring them back to the ED if: A.  The seizure lasts longer than 5 minutes.      B.  The patient doesn't wake shortly after the seizure or has new problems such as difficulty seeing, speaking or moving following the seizure C.  The patient  was injured during the seizure D.  The patient has a temperature over 102 F (39C) E.  The patient vomited during the seizure and now is having trouble breathing    During the Seizure   - First, ensure adequate ventilation and place patients on the floor on their left side  Loosen clothing around the neck and ensure the airway is patent. If the patient is clenching the teeth, do not force the mouth open with any object as this can cause severe damage - Remove all items from the surrounding that can be hazardous. The patient may be oblivious to what's happening and may not even know what he or she is doing. If the patient  is confused and wandering, either gently guide him/her away and block access to outside areas - Reassure the individual and be comforting - Call 911. In most cases, the seizure ends before EMS arrives. However, there are cases when seizures may last over 3 to 5 minutes. Or the individual may have developed breathing difficulties or severe injuries. If a pregnant patient or a person with diabetes develops a seizure, it is prudent to call an ambulance.   After the Seizure (Postictal Stage)   After a seizure, most patients experience confusion, fatigue, muscle pain and/or a headache. Thus, one should permit the individual to sleep. For the next few days, reassurance is essential. Being calm and helping reorient the person is also of importance.   Most seizures are painless and end spontaneously. Seizures are not harmful to others but can lead to complications such as stress on the lungs, brain and the heart. Individuals with prior lung problems may develop labored breathing and respiratory distress.     I have spent a total of  40  minutes with the patient reviewing hospital notes,  test results, labs and examining the patient as well as establishing an assessment and plan that was discussed personally with the patient.  > 50% of time was spent in direct patient care.       Roxy Cordial Epilepsy [facility] For questions after 5pm please refer to AMION to reach the Neurologist on call

## 2023-08-12 NOTE — Progress Notes (Signed)
 vLTM discontinued  [facility] notified  No skin breakdown noted at A1 A2  FP1  FP2  F3  F4

## 2023-08-12 NOTE — Hospital Course (Signed)
 Ms. Cardon is a 38 yo female with PMH right breast cancer s/p bilateral mastectomies followed by chemoradiation and antiestrogen therapy.  Initially diagnosed in 2022 and last seen by oncology in 2023. Presented with aphasia of sudden onset which resolved after approximately 1 hour.  She was initially brought to the hospital for stroke workup.  MRI brain was negative for acute infarct but did reveal underlying lesions concerning for metastatic lesions.  She was admitted for further evaluation with oncology.

## 2023-08-12 NOTE — Procedures (Addendum)
 Patient Name: Kayla Little  MRN: [id number]  Epilepsy Attending: Arleene Lack  Referring Physician/Provider: Laymond Priestly, NP  Duration: 08/11/2023 1334 to 08/12/2023 3086  Patient history: 38 y.o. female  hx of breast cancer, thyroid  cancer s/p thyroidectomy ADD, ocular migraines presented from home via EMS for an episode of acute aphasia.  EEG to evaluate for seizure  Level of alertness: Awake, asleep  AEDs during EEG study: LEV  Technical aspects: This EEG study was done with scalp electrodes positioned according to the 10-20 International system of electrode placement. Electrical activity was reviewed with band pass filter of 1-70Hz , sensitivity of 7 uV/mm, display speed of 31mm/sec with a 60Hz  notched filter applied as appropriate. EEG data were recorded continuously and digitally stored.  Video monitoring was available and reviewed as appropriate.  Description: The posterior dominant rhythm consists of 9Hz  activity of moderate voltage (25-35 uV) seen predominantly in posterior head regions, symmetric and reactive to eye opening and eye closing. Sleep was characterized by vertex waves, sleep spindles (12 to 14 Hz), maximal frontocentral region. Hyperventilation and photic stimulation were not performed.     IMPRESSION: This study is within normal limits. No seizures or epileptiform discharges were seen throughout the recording.  A normal interictal EEG does not exclude the diagnosis of epilepsy.   Collin Rengel O Ripley Lovecchio

## 2023-08-12 NOTE — Progress Notes (Signed)
vLTM maintenance  All impedances below 10kohms.  No skin breakdown noted at FP1 FP2  FZ  CZ

## 2023-08-15 ENCOUNTER — Telehealth: Payer: Self-pay | Admitting: Radiation Therapy

## 2023-08-15 ENCOUNTER — Other Ambulatory Visit: Payer: Self-pay | Admitting: Radiation Therapy

## 2023-08-15 ENCOUNTER — Inpatient Hospital Stay: Attending: Radiation Oncology

## 2023-08-15 DIAGNOSIS — Z79818 Long term (current) use of other agents affecting estrogen receptors and estrogen levels: Secondary | ICD-10-CM | POA: Insufficient documentation

## 2023-08-15 DIAGNOSIS — C50211 Malignant neoplasm of upper-inner quadrant of right female breast: Secondary | ICD-10-CM | POA: Insufficient documentation

## 2023-08-15 DIAGNOSIS — C7931 Secondary malignant neoplasm of brain: Secondary | ICD-10-CM | POA: Insufficient documentation

## 2023-08-15 DIAGNOSIS — Z9221 Personal history of antineoplastic chemotherapy: Secondary | ICD-10-CM | POA: Insufficient documentation

## 2023-08-15 DIAGNOSIS — Z923 Personal history of irradiation: Secondary | ICD-10-CM | POA: Insufficient documentation

## 2023-08-15 DIAGNOSIS — Z170 Estrogen receptor positive status [ER+]: Secondary | ICD-10-CM | POA: Insufficient documentation

## 2023-08-15 NOTE — Telephone Encounter (Signed)
 I spoke with Mrs. Kayla Little about her upcoming radiation treatment planning appointments. She is scheduled to see Dr. Nat Badger this afternoon to discuss radiosurgery. As soon as I have the treatment appointments set up I will give her a call back to review.   Axel Bohr R.T(R)(T) Radiation Special Procedures Lead

## 2023-08-16 NOTE — Progress Notes (Signed)
 Has armband been applied?  Yes.    Does patient have an allergy to IV contrast dye?: No.   Has patient ever received premedication for IV contrast dye?: No.   Does patient take metformin?: No.  If patient does take metformin when was the last dose:   Date of lab work: 08/11/2023 BUN: 10 CR: 1.04 eGfr: >60  IV site: left ac  Has IV site been added to flowsheet?  Yes   BP 132/88   Pulse 74   Temp (!) 97.5 F (36.4 C)   Resp 18   Ht 5' 6.5 (1.689 m)   Wt 174 lb (78.9 kg)   SpO2 100%   BMI 27.66 kg/m   '

## 2023-08-17 ENCOUNTER — Telehealth: Payer: Self-pay | Admitting: *Deleted

## 2023-08-17 ENCOUNTER — Other Ambulatory Visit: Payer: Self-pay | Admitting: Radiation Oncology

## 2023-08-17 NOTE — Telephone Encounter (Signed)
 Spoke with the patient to see how she was doing since starting Dexamethasone  4 mg TID.  She reports she has been doing pretty good just having some trouble sleeping.  Speech is clear today.  She has been taking her ativan  more regularly due to sleep disturbance from the steroids.  She and her family are heading to [place] this weekend and she would like to have a refill on her ativan .  I let her know that I would discuss this with the provider to see if we can accommodate this since we do not manage this for her.  She verbalized understanding.  I verified her pharmacy.    We reviewed her upcoming appointments in our office.  No other questions or concerns voiced.  Christianne Cowper RN, BSN

## 2023-08-18 ENCOUNTER — Ambulatory Visit (HOSPITAL_COMMUNITY)
Admission: RE | Admit: 2023-08-18 | Discharge: 2023-08-18 | Disposition: A | Discharge status: Home / Self Care | Source: Ambulatory Visit | Attending: Radiation Oncology | Admitting: Radiation Oncology

## 2023-08-18 DIAGNOSIS — C7931 Secondary malignant neoplasm of brain: Secondary | ICD-10-CM | POA: Diagnosis present

## 2023-08-18 MED ORDER — GADOBUTROL 1 MMOL/ML IV SOLN
7.5000 mL | Freq: Once | INTRAVENOUS | Status: AC | PRN
Start: 2023-08-18 — End: 2023-08-18
  Administered 2023-08-18: 7.5 mL via INTRAVENOUS

## 2023-08-19 ENCOUNTER — Ambulatory Visit
Admission: RE | Admit: 2023-08-19 | Discharge: 2023-08-19 | Disposition: A | Discharge status: Home / Self Care | Payer: Self-pay | Source: Ambulatory Visit | Attending: Radiation Oncology

## 2023-08-19 ENCOUNTER — Ambulatory Visit
Admission: RE | Admit: 2023-08-19 | Discharge: 2023-08-19 | Disposition: A | Discharge status: Home / Self Care | Payer: Self-pay | Source: Ambulatory Visit | Attending: Radiation Oncology | Admitting: Radiation Oncology

## 2023-08-19 VITALS — BP 132/88 | HR 74 | Temp 97.5°F | Resp 18 | Ht 66.5 in | Wt 174.0 lb

## 2023-08-19 DIAGNOSIS — C7931 Secondary malignant neoplasm of brain: Secondary | ICD-10-CM | POA: Insufficient documentation

## 2023-08-19 DIAGNOSIS — C50211 Malignant neoplasm of upper-inner quadrant of right female breast: Secondary | ICD-10-CM | POA: Insufficient documentation

## 2023-08-19 DIAGNOSIS — Z510 Encounter for antineoplastic radiation therapy: Secondary | ICD-10-CM | POA: Diagnosis present

## 2023-08-29 ENCOUNTER — Inpatient Hospital Stay: Admitting: Internal Medicine

## 2023-08-29 VITALS — BP 110/68 | HR 85 | Temp 97.5°F | Resp 20 | Wt 181.3 lb

## 2023-08-29 DIAGNOSIS — Z79818 Long term (current) use of other agents affecting estrogen receptors and estrogen levels: Secondary | ICD-10-CM | POA: Diagnosis not present

## 2023-08-29 DIAGNOSIS — C7931 Secondary malignant neoplasm of brain: Secondary | ICD-10-CM

## 2023-08-29 DIAGNOSIS — C50211 Malignant neoplasm of upper-inner quadrant of right female breast: Secondary | ICD-10-CM | POA: Diagnosis present

## 2023-08-29 DIAGNOSIS — Z9221 Personal history of antineoplastic chemotherapy: Secondary | ICD-10-CM | POA: Diagnosis not present

## 2023-08-29 DIAGNOSIS — Z923 Personal history of irradiation: Secondary | ICD-10-CM | POA: Diagnosis not present

## 2023-08-29 DIAGNOSIS — Z170 Estrogen receptor positive status [ER+]: Secondary | ICD-10-CM | POA: Diagnosis not present

## 2023-08-29 MED ORDER — DEXAMETHASONE 1 MG PO TABS: ORAL_TABLET | ORAL | 0 refills | Status: AC

## 2023-08-29 MED ORDER — LEVETIRACETAM 1000 MG PO TABS: 1000.0000 mg | ORAL_TABLET | Freq: Two times a day (BID) | ORAL | 3 refills | Status: DC

## 2023-08-29 NOTE — Progress Notes (Signed)
 [facility] [address]  [place], [place] [address] [phone]   New Patient Evaluation  Date of Service: 08/29/23 Patient Name: Kayla Little Patient MRN: [id number] Patient DOB: January 16, 1986 Provider: Arthea MARLA Manns, MD  Identifying Statement:  Kayla Little is a 38 y.o. female with Metastasis to brain [facility]) who presents for initial consultation and evaluation regarding cancer associated neurologic deficits.    Referring Provider: [facility] [place] [address] [place],  [place] [address]  Primary Cancer:  Oncologic History: Oncology History  Malignant neoplasm of upper-inner quadrant of right breast in female, estrogen receptor positive (HCC)  07/08/2020 Initial Diagnosis   a clinical mT2 N3, stage IIIA invasive ductal carcinoma, grade 1, triple positive  (a) breast MRI 07/06/2020 finds a 1.6 cm lower inner quadrant mass associated with 5.4 cm of non-mass-like enhancement, at least 8 enlarged right axillary lymph node and 1 right internal mammary lymph node  (b) CT scans of the neck and chest 06/24/2020 shows subcentimeter right cervical adenopathy, 2 right supraclavicular lymph nodes the larger 0.65 cm, also left cervical adenopathy largest measuring 1.02 cm; there is also a 1.2 cm lesion in the left hepatic lobe, and a sclerotic lesion in the mid thoracic spine at T7  (c) PET scan 07/21/2020 shows regional adenopathy (including 2 right subcentimeter supraclavicular lymph nodes and subcentimeter lymph nodes in the right hilar, subcarinal and lateral aortic region), no uptake in liver, and an indeterminate T12 lesion  (d) thoracic spine MRI 07/27/2020 shows the T12 lesion to be likely benign, there is also degenerative disc disease   07/23/2020 - 12/18/2020 Neo-Adjuvant Chemotherapy   neoadjuvant chemotherapy will consist of carboplatin , docetaxel , trastuzumab , and pertuzumab  q. 21 days x 6 07/23/2020-11/06/2020 --two  doses of Trastuzumab  given on 9/22 and 10/13   01/07/2021 Cancer Staging   Staging form: Breast, AJCC 8th Edition - Pathologic stage from 01/07/2021: No Stage Recommended (ypT2, pN88mi, cM0, G2) - Signed by Crawford Morna Pickle, NP on 01/15/2021 Stage prefix: Post-therapy Histologic grading system: 3 grade system   01/07/2021 Surgery   She underwent bilateral mastectomy on 01/07/2021 that showed: left breast no evidence of malignancy, and the right breast invasive ductal carcinoma grade 2, spanning fibrotic area of approximately 5 cm in patches.  Margins were negative.   (A) Microscopic metastatic carcinoma was noted in 1 out of 15 lymph nodes biopsied.   01/26/2021 - 10/14/2021 Chemotherapy   Patient is on Treatment Plan : BREAST ADO-Trastuzumab Emtansine  (Kadcyla ) q21d     02/13/2021 - 04/01/2021 Radiation Therapy   Site Technique Total Dose (Gy) Dose per Fx (Gy) Completed Fx Beam Energies  Chest Wall, Right: CW_R 3D 50.4/50.4 1.8 28/28 6XFFF, 10X  Chest Wall, Right: CW_R_SCLV 3D 50.4/50.4 1.8 28/28 6X, 10X  Chest Wall, Right: CW_R_Bst Electron 10/10 2 5/5 6E     04/08/2021 -  Anti-estrogen oral therapy   Antiestrogen therapy with Zolodex and AI starting 04/21/21    CNS Oncologic History 08/31/23: SRS for 6 metastases ([place])  History of Present Illness: The patient's records from the referring physician were obtained and reviewed and the patient interviewed to confirm this HPI.  Kayla Little presents today for evaluation after CNS complaints, brain MRI.  She describes an episode of unable to speak on 08/11/23, lasting for hour before return to prior baseline.  CNS imaging was obtained and demonstrated several brain metastases from breast primary.  Was started on Keppra  1250mg  BID  for presumed seizure, as well as decadron  4mg  three times per day.  She has been off systemic therapy for some time, previously had followed with Dr. Gudena.  No neurologic complaints today.  Medications: Current  Outpatient Medications on File Prior to Visit  Medication Sig Dispense Refill   acetaminophen  (TYLENOL ) 325 MG tablet Take 2 tablets (650 mg total) by mouth every 4 (four) hours as needed (for pain scale < 4). 100 tablet 0   dexamethasone  (DECADRON ) 4 MG tablet Take 1 tablet (4 mg total) by mouth 3 (three) times daily. 90 tablet 1   diazePAM , 20 MG Dose, 2 x 10 MG/0.1ML LQPK Place 20 mg into the nose once as needed for up to 1 dose. Use for seizure lasting greater than 2 minutes 2 each 0   levETIRAcetam  (KEPPRA ) 1000 MG tablet Take 1 tablet (1,000 mg total) by mouth 2 (two) times daily. Take with Keppra  250 mg tablet for total dose of 1,250 mg 60 tablet 3   levETIRAcetam  (KEPPRA ) 250 MG tablet Take 1 tablet (250 mg total) by mouth 2 (two) times daily. Take with Keppra  1,000 mg tablet for total dose of 1,250 mg 60 tablet 3   levonorgestrel (MIRENA) 20 MCG/DAY IUD 1 each by Intrauterine route once.     levothyroxine  (SYNTHROID ) 175 MCG tablet Take 175 mcg by mouth daily before breakfast.     LORazepam  (ATIVAN ) 0.5 MG tablet Take 1 tablet (0.5 mg total) by mouth every 12 (twelve) hours as needed for anxiety. (Patient taking differently: Take 0.5 mg by mouth daily as needed for anxiety.) 30 tablet 0   sertraline  (ZOLOFT ) 50 MG tablet Take 50 mg by mouth at bedtime.     VYVANSE  60 MG capsule Take 60 mg by mouth every morning.     [DISCONTINUED] prochlorperazine  (COMPAZINE ) 10 MG tablet Take 1 tablet (10 mg total) by mouth every 6 (six) hours as needed (Nausea or vomiting). 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)    Hx of varicella    Kidney stone 2012   Medullary thyroid  carcinoma (HCC)    Monoallelic mutation of CHEK2 gene in female patient    Postpartum care following vaginal delivery (7/27) 10/02/2014, June 16 , 2020   Second degree perineal laceration 08/23/2018   SVD (spontaneous vaginal delivery) 08/23/2018   Thyroid  cancer (HCC) 2021   Past Surgical History:  Past Surgical History:  Procedure Laterality Date   IR IMAGING GUIDED PORT INSERTION  07/22/2020   IR REMOVAL TUN ACCESS W/ PORT W/O FL MOD SED  11/12/2021   MODIFIED MASTECTOMY Right 01/07/2021   Procedure: RIGHT MODIFIED RADICAL MASTECTOMY;  Surgeon: Curvin Deward MOULD, MD;  Location: [facility];  Service: General;  Laterality: Right;   NO PAST SURGERIES  SIMPLE MASTECTOMY WITH AXILLARY SENTINEL NODE BIOPSY Left 01/07/2021   Procedure: LEFT SIMPLE MASTECTOMY;  Surgeon: Curvin Deward MOULD, MD;  Location: [facility];  Service: General;  Laterality: Left;   THYROIDECTOMY N/A 04/12/2019   Procedure: TOTAL THYROIDECTOMY WITH LIMITED LYMPH NODE DISSECTION;  Surgeon: Eletha Boas, MD;  Location: [facility];  Service: General;  Laterality: N/A;   UNILATERAL SALPINGECTOMY Right 07/09/2017   Procedure: RIGHT SALPINGECTOMY WITH REMOVAL OF ECTOPIC PREGNANCY;  Surgeon: Barbette Knock, MD;  Location: [facility];  Service: Gynecology;  Laterality: Right;   Social History:  Social History   Socioeconomic History   Marital status: Married    Spouse name: Not on file   Number of children: Not on file   Years of education: Not on file   Highest education level: Not on file  Occupational History   Occupation: homemaker  Tobacco Use   Smoking status: Some Days    Current packs/day: 0.50    Average packs/day: 0.5  packs/day for 5.0 years (2.5 ttl pk-yrs)    Types: Cigarettes   Smokeless tobacco: Never   Tobacco comments:    Actively trying to quit.  Vaping Use   Vaping status: Never Used  Substance and Sexual Activity   Alcohol use: Yes    Alcohol/week: 7.0 - 14.0 standard drinks of alcohol    Types: 7 - 14 Glasses of wine per week    Comment: glass of wine each night    Drug use: Not Currently    Types: Marijuana    Comment: not during pregnancy, last use was 03/27/2019   Sexual activity: Yes    Birth control/protection: None, I.U.D.  Other Topics Concern   Not on file  Social History Narrative   Not on file   Social Drivers of Health   Financial Resource Strain: Low Risk  (07/10/2020)   Overall Financial Resource Strain (CARDIA)    Difficulty of Paying Living Expenses: Not hard at all  Food Insecurity: No Food Insecurity (08/12/2023)   Hunger Vital Sign    Worried About Running Out of Food in the Last Year: Never true    Ran Out of Food in the Last Year: Never true  Transportation Needs: No Transportation Needs (08/12/2023)   PRAPARE - Administrator, Civil Service (Medical): No    Lack of Transportation (Non-Medical): No  Physical Activity: Not on file  Stress: Stress Concern Present (08/11/2018)   [employer] of Occupational Health - Occupational Stress Questionnaire    Feeling of Stress : To some extent  Social Connections: Unknown (07/13/2021)   Received from [facility]   Social Network    Social Network: Not on file  Intimate Partner Violence: Not At Risk (08/12/2023)   Humiliation, Afraid, Rape, and Kick questionnaire    Fear of Current or Ex-Partner: No    Emotionally Abused: No    Physically Abused: No    Sexually Abused: No   Family History:  Family History  Problem Relation Age of Onset   Breast cancer Mother 77       2 types of cancer one side mastectomy with chemo   Endometriosis Mother    Endometriosis Maternal Grandmother    Breast cancer Maternal  Grandmother        post menopausal   Diabetes Maternal Grandmother    Cancer Maternal Grandfather        prostate and lymphoma   Alzheimer's disease Paternal Grandmother    Cancer Father 76  prostate cancer   Aneurysm Paternal Grandfather        d. 30s    Review of Systems: Constitutional: Doesn't report fevers, chills or abnormal weight loss Eyes: Doesn't report blurriness of vision Ears, nose, mouth, throat, and face: Doesn't report sore throat Respiratory: Doesn't report cough, dyspnea or wheezes Cardiovascular: Doesn't report palpitation, chest discomfort  Gastrointestinal:  Doesn't report nausea, constipation, diarrhea GU: Doesn't report incontinence Skin: Doesn't report skin rashes Neurological: Per HPI Musculoskeletal: Doesn't report joint pain Behavioral/Psych: Doesn't report anxiety  Physical Exam: There were no vitals filed for this visit. KPS: 90. General: Alert, cooperative, pleasant, in no acute distress Head: Normal EENT: No conjunctival injection or scleral icterus.  Lungs: Resp effort normal Cardiac: Regular rate Abdomen: Non-distended abdomen Skin: No rashes cyanosis or petechiae. Extremities: No clubbing or edema  Neurologic Exam: Mental Status: Awake, alert, attentive to examiner. Oriented to self and environment. Language is fluent with intact comprehension.  Cranial Nerves: Visual acuity is grossly normal. Visual fields are full. Extra-ocular movements intact. No ptosis. Face is symmetric Motor: Tone and bulk are normal. Power is full in both arms and legs. Reflexes are symmetric, no pathologic reflexes present.  Sensory: Intact to light touch Gait: Normal.   Labs: I have reviewed the data as listed    Component Value Date/Time   NA 136 08/11/2023 1109   NA 142 02/16/2017 1332   K 4.3 08/11/2023 1109   CL 103 08/11/2023 1109   CO2 20 (L) 08/11/2023 1108   GLUCOSE 105 (H) 08/11/2023 1109   BUN 10 08/11/2023 1109   BUN 9 02/16/2017 1332    CREATININE 0.90 08/11/2023 1109   CREATININE 0.70 11/10/2021 1048   CREATININE 0.82 12/04/2012 1732   CALCIUM  8.6 (L) 08/11/2023 1108   PROT 6.9 08/11/2023 1108   PROT 6.5 02/16/2017 1332   ALBUMIN  3.9 08/11/2023 1108   ALBUMIN  4.3 02/16/2017 1332   AST 29 08/11/2023 1108   AST 43 (H) 11/10/2021 1048   ALT 24 08/11/2023 1108   ALT 36 11/10/2021 1048   ALKPHOS 45 08/11/2023 1108   BILITOT 0.7 08/11/2023 1108   BILITOT 0.7 11/10/2021 1048   GFRNONAA >60 08/11/2023 1108   GFRNONAA >60 11/10/2021 1048   GFRAA >60 04/15/2019 2145   Lab Results  Component Value Date   WBC 10.2 08/11/2023   NEUTROABS 6.6 08/11/2023   HGB 14.3 08/11/2023   HCT 42.0 08/11/2023   MCV 101.2 (H) 08/11/2023   PLT 327 08/11/2023    Imaging:  MR Brain W Wo Contrast Result Date: 08/18/2023 CLINICAL DATA:  Metastatic disease evaluation 3T SRS Protocol for radiation treatment planning EXAM: MRI HEAD WITHOUT AND WITH CONTRAST TECHNIQUE: Multiplanar, multiecho pulse sequences of the brain and surrounding structures were obtained without and with intravenous contrast. CONTRAST:  7.5mL GADAVIST  GADOBUTROL  1 MMOL/ML IV SOLN COMPARISON:  MRI of the brain dated August 11, 2023. FINDINGS: Brain: There are several metastatic lesions again demonstrated within the cerebral and cerebellar hemispheres. A hemorrhagic metastatic lesion is again demonstrated posterolaterally within the left temporal lobe measuring approximately 15 mm in long axis, as before. An ovoid lesion measuring 15 mm in long axis is again demonstrated in the right posteromedial parietal lobe. There is a 3 mm lesion within the left thalamus. The additional focal area of contrast enhancement noted within the left thalamus on the contrast enhanced T1 weighted sequences appears represent vessel, but a lesion cannot be definitively excluded. There is also a lesions suspected within the left  frontal lobe image 203 of series 1000. An 8 mm lesion is again demonstrated  in the posterosuperior cerebellum. There is also an enhancing lesion lesion within the right cerebellar hemisphere on image 148 of series 1000. Vascular: Normal vascular flow voids. Skull and upper cervical spine: Normal signal intensity. Sinuses/Orbits: Unremarkable. Other: None. IMPRESSION: 1. In addition to the lesions seen on the previous study, there appears to be a small metastatic lesion within the left frontal lobe. The lesion previously described within the right cerebellar hemisphere is more convincingly demonstrated on the current study. There is 1 definite lesion within the left thalamus. The second area of enhancement appears to be vascular, but a metastasis can not be excluded. Electronically Signed   By: Evalene Coho M.D.   On: 08/18/2023 14:43   CT CHEST ABDOMEN PELVIS W CONTRAST Result Date: 08/12/2023 CLINICAL DATA:  Metastatic breast cancer, assess treatment response * Tracking Code: BO * EXAM: CT CHEST, ABDOMEN, AND PELVIS WITH CONTRAST TECHNIQUE: Multidetector CT imaging of the chest, abdomen and pelvis was performed following the standard protocol during bolus administration of intravenous contrast. RADIATION DOSE REDUCTION: This exam was performed according to the departmental dose-optimization program which includes automated exposure control, adjustment of the mA and/or kV according to patient size and/or use of iterative reconstruction technique. CONTRAST:  75mL OMNIPAQUE  IOHEXOL  350 MG/ML SOLN COMPARISON:  PET-CT, 07/21/2020 FINDINGS: CT CHEST FINDINGS Cardiovascular: No significant vascular findings. Normal heart size. No pericardial effusion. Mediastinum/Nodes: No enlarged mediastinal, hilar, or axillary lymph nodes. Thyroidectomy. Trachea, and esophagus demonstrate no significant findings. 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 Musculoskeletal: Status post bilateral mastectomy with axillary lymph  node dissection. No acute osseous findings. CT ABDOMEN PELVIS FINDINGS Hepatobiliary: No solid liver abnormality is seen. Hepatic steatosis. No gallstones, gallbladder wall thickening, or biliary dilatation. Pancreas: Unremarkable. No pancreatic ductal dilatation or surrounding inflammatory changes. Spleen: Normal in size without significant abnormality. Adrenals/Urinary Tract: Adrenal glands are unremarkable. Kidneys are normal, without renal calculi, solid lesion, or hydronephrosis. Bladder is unremarkable. Stomach/Bowel: Stomach is within normal limits. Appendix appears normal. Diffuse wall thickening and mucosal hyperenhancement of the decompressed descending colon, sigmoid colon, and rectum (series 6, image 44, series 3, image 93). Vascular/Lymphatic: No significant vascular findings are present. No enlarged abdominal or pelvic lymph nodes. Reproductive: No mass or other abnormality. IUD present in the fundal endometrial cavity. Other: No abdominal wall hernia or abnormality. No ascites. Musculoskeletal: No acute osseous findings. IMPRESSION: 1. Status post bilateral mastectomy with axillary lymph node dissection. 2. No evidence of lymphadenopathy or metastatic disease in the chest, abdomen, or pelvis. 3. Diffuse wall thickening and mucosal hyperenhancement of the decompressed descending colon, sigmoid colon, and rectum, consistent with nonspecific infectious or inflammatory colitis. 4. Hepatic steatosis. Electronically Signed   By: Marolyn JONETTA Jaksch M.D.   On: 08/12/2023 12:30   Overnight EEG with video Result Date: 08/12/2023 Shelton Arlin KIDD, MD     08/13/2023  7:15 AM Patient Name: DARON BREEDING MRN: [id number] Epilepsy Attending: Arlin KIDD Shelton Referring Physician/Provider: Waddell Karna LABOR, NP Duration: 08/11/2023 1334 to 08/12/2023 [id number] Patient history: 38 y.o. female  hx of breast cancer, thyroid  cancer s/p thyroidectomy ADD, ocular migraines presented from home via EMS for an episode of acute aphasia.  EEG to  evaluate for seizure Level of alertness: Awake, asleep AEDs during EEG study: LEV Technical aspects: This EEG study was done with scalp electrodes positioned according to the 10-20  International system of electrode placement. Electrical activity was reviewed with band pass filter of 1-70Hz , sensitivity of 7 uV/mm, display speed of 35mm/sec with a 60Hz  notched filter applied as appropriate. EEG data were recorded continuously and digitally stored.  Video monitoring was available and reviewed as appropriate. Description: The posterior dominant rhythm consists of 9Hz  activity of moderate voltage (25-35 uV) seen predominantly in posterior head regions, symmetric and reactive to eye opening and eye closing. Sleep was characterized by vertex waves, sleep spindles (12 to 14 Hz), maximal frontocentral region. Hyperventilation and photic stimulation were not performed.   IMPRESSION: This study is within normal limits. No seizures or epileptiform discharges were seen throughout the recording. A normal interictal EEG does not exclude the diagnosis of epilepsy. Arlin MALVA Krebs   MR BRAIN W WO CONTRAST Result Date: 08/11/2023 CLINICAL DATA:  Metastatic disease evaluation. Right frontoparietal mass. History of breast cancer. Abnormal head CT. EXAM: MRI HEAD WITHOUT AND WITH CONTRAST TECHNIQUE: Multiplanar, multiecho pulse sequences of the brain and surrounding structures were obtained without and with intravenous contrast. CONTRAST:  7.5mL GADAVIST  GADOBUTROL  1 MMOL/ML IV SOLN COMPARISON:  Head CT same day FINDINGS: Brain: Diffusion imaging does not show any acute or subacute infarction. There are 2 punctate metastases noted within the left thalamus, the larger measuring 3 mm, axial image 50. Smaller adjacent punctate metastasis visible on the sagittal postcontrast images. There is an 8 mm enhancing metastasis within the posterosuperior midline cerebellum without edema or hemorrhage. In the right cerebellar hemisphere more  laterally, FLAIR image 10 and postcontrast coronal image 10, there is question of a subtle additional right cerebellar metastasis. This is not confirmed on the axial or sagittal images. Right cerebral hemisphere shows a 14 mm in diameter metastasis at the parietooccipital junction with regional vasogenic edema. No hemorrhage or necrosis. In the left hemisphere, other than the thalamic lesions, there is a 15 mm metastasis at the posterior temporal/parietal/occipital junction with surrounding vasogenic edema. No brain shift. No hydrocephalus. No extra-axial collection. No sign of widespread leptomeningeal enhancement. Vascular: Major vessels at the base of the brain show flow. Skull and upper cervical spine: Negative Sinuses/Orbits: Clear/normal Other: None IMPRESSION: 1. 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 No hemorrhage . No brain shift or hydrocephalus. Electronically Signed   By: Oneil Officer M.D.   On: 08/11/2023 12:55   CT HEAD CODE STROKE WO CONTRAST Result Date: 08/11/2023 CLINICAL DATA:  Code stroke. Neuro deficit, acute, stroke suspected. EXAM: CT HEAD WITHOUT CONTRAST TECHNIQUE: Contiguous axial images were obtained from the base of the skull through the vertex without intravenous contrast. RADIATION DOSE REDUCTION: This exam was performed according to the departmental dose-optimization program which includes automated exposure control, adjustment of the mA and/or kV according to patient size and/or use of iterative reconstruction technique. COMPARISON:  None. FINDINGS: Brain: No age-advanced or lobar predominant cerebral atrophy. 16 mm lesion within the right  parietal lobe with mild surrounding vasogenic edema (for instance as seen on series 7, image 33) (series 3, image 24). 15 mm lesion at the left temporoparietal junction with mild surrounding edema (best appreciated on series 6, image 27). These lesions are somewhat hyperdense and this may reflect associated hemorrhage. No  demarcated cortical infarct. No extra-axial fluid collection. No midline shift. Vascular: No hyperdense vessel. Skull: No calvarial fracture or aggressive osseous lesion. Sinuses/Orbits: No mass or acute finding within the imaged orbits. No significant paranasal sinus disease at the imaged levels. ASPECTS (Alberta Stroke Program Early CT Score) - Ganglionic level infarction (caudate, lentiform nuclei, internal capsule, insula, M1-M3 cortex): 7 - Supraganglionic infarction (M4-M6 cortex): 3 Total score (0-10 with 10 being normal): 10 Study discussed by telephone at the time of interpretation on 08/11/2023 at 11:12 am with provider Dr. Matthews. IMPRESSION: 1. Lesions within the right parietal lobe (16 mm) and at the left temporoparietal junction (15 mm) with mild surrounding edema. 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 The lesions are somewhat hyperdense, and this may reflect associated hemorrhage. A brain MRI (with and without contrast) is recommended for further evaluation. 2. No evidence of an acute infarct. Electronically Signed   By: Rockey Childs D.O.   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  For metastases, plans to proceed later this week with radiosurgery with Dr. Dewey.    For seizures, recommended reducing Keppra  to 1000mg  BID given seizure freedom since discharge, fatigue symptoms.  Recommended decreasing decadron  to 4mg  daily, then by 1mg  each week, starting with 3mg  daily next week.  Dose may be  modified if focal symptoms recur.  Recommended she re-establish care with medical oncology given CNS findings, concern for systemic recurrence and re-staging.  We spent twenty additional minutes teaching regarding the natural history, biology, and historical experience in the treatment of neurologic complications of cancer.   We appreciate the opportunity to participate in the care of Kayla Little.   We ask that Kayla Little return to clinic in 3 months following next brain MRI, or sooner as needed.  All questions were answered. The patient knows to call the clinic with any problems, questions or concerns. No barriers to learning were detected.  The total time spent in the encounter was 40 minutes and more than 50% was on counseling and review of test results   Arthea MARLA Manns, MD Medical Director of Neuro-Oncology [facility] [place] 08/29/23 9:34 AM

## 2023-08-30 ENCOUNTER — Other Ambulatory Visit: Payer: Self-pay | Admitting: Radiation Therapy

## 2023-08-30 DIAGNOSIS — Z510 Encounter for antineoplastic radiation therapy: Secondary | ICD-10-CM | POA: Diagnosis not present

## 2023-08-31 ENCOUNTER — Ambulatory Visit
Admission: RE | Admit: 2023-08-31 | Discharge: 2023-08-31 | Disposition: A | Discharge status: Home / Self Care | Source: Ambulatory Visit | Attending: Radiation Oncology | Admitting: Radiation Oncology

## 2023-08-31 ENCOUNTER — Other Ambulatory Visit: Payer: Self-pay

## 2023-08-31 DIAGNOSIS — Z510 Encounter for antineoplastic radiation therapy: Secondary | ICD-10-CM | POA: Diagnosis not present

## 2023-08-31 LAB — RAD ONC ARIA SESSION SUMMARY
Course Elapsed Days: 0
Plan Fractions Treated to Date: 1
Plan Prescribed Dose Per Fraction: 9 Gy
Plan Total Fractions Prescribed: 3
Plan Total Prescribed Dose: 27 Gy
Reference Point Dosage Given to Date: 9 Gy
Reference Point Session Dosage Given: 9 Gy
Session Number: 1

## 2023-08-31 NOTE — Progress Notes (Signed)
 Kayla Little rested with us  for 15 minutes following her SRS treatment.  Patient denies headache, dizziness, nausea, diplopia or ringing in the ears. Denies fatigue. Patient without complaints. Understands to avoid strenuous activity for the next 24 hours and call [phone] with needs.   BP 111/68 (BP Location: Left Arm, Patient Position: Sitting, Cuff Size: Large)   Pulse 76   Temp 97.8 F (36.6 C)   Resp 18   SpO2 100%

## 2023-09-02 ENCOUNTER — Ambulatory Visit
Admission: RE | Admit: 2023-09-02 | Discharge: 2023-09-02 | Disposition: A | Discharge status: Home / Self Care | Source: Ambulatory Visit | Attending: Radiation Oncology | Admitting: Radiation Oncology

## 2023-09-02 ENCOUNTER — Other Ambulatory Visit: Payer: Self-pay

## 2023-09-02 DIAGNOSIS — Z510 Encounter for antineoplastic radiation therapy: Secondary | ICD-10-CM | POA: Diagnosis not present

## 2023-09-02 LAB — RAD ONC ARIA SESSION SUMMARY
Course Elapsed Days: 2
Plan Fractions Treated to Date: 2
Plan Prescribed Dose Per Fraction: 9 Gy
Plan Total Fractions Prescribed: 3
Plan Total Prescribed Dose: 27 Gy
Reference Point Dosage Given to Date: 18 Gy
Reference Point Session Dosage Given: 9 Gy
Session Number: 2

## 2023-09-02 NOTE — Progress Notes (Signed)
 Kayla Little rested with us  for 15 minutes following her SRS treatment.  Patient denies headache, dizziness, nausea, diplopia or ringing in the ears. Denies fatigue. Patient without complaints. Understands to avoid strenuous activity for the next 24 hours and call [phone] with needs.   BP 118/79 (BP Location: Left Arm)   Pulse 72   Temp (!) 97.1 F (36.2 C) (Temporal)   Resp 18   SpO2 100%

## 2023-09-05 ENCOUNTER — Ambulatory Visit
Admission: RE | Admit: 2023-09-05 | Discharge: 2023-09-05 | Disposition: A | Discharge status: Home / Self Care | Source: Ambulatory Visit | Attending: Radiation Oncology | Admitting: Radiation Oncology

## 2023-09-05 ENCOUNTER — Telehealth: Payer: Self-pay

## 2023-09-05 ENCOUNTER — Other Ambulatory Visit: Payer: Self-pay

## 2023-09-05 DIAGNOSIS — Z510 Encounter for antineoplastic radiation therapy: Secondary | ICD-10-CM | POA: Diagnosis not present

## 2023-09-05 LAB — RAD ONC ARIA SESSION SUMMARY
Course Elapsed Days: 5
Plan Fractions Treated to Date: 3
Plan Prescribed Dose Per Fraction: 9 Gy
Plan Total Fractions Prescribed: 3
Plan Total Prescribed Dose: 27 Gy
Reference Point Dosage Given to Date: 27 Gy
Reference Point Session Dosage Given: 9 Gy
Session Number: 3

## 2023-09-05 NOTE — Progress Notes (Signed)
 Nurse monitoring complete status post of 3 SRS treatments. Patient without complaints. Patient denies new or worsening neurologic symptoms. Vitals stable. Instructed patient to avoid strenuous activity for the next 24 hours. (Nurse Instructed patient to not miss any of her decadron  doses 1mg  taper). Dr. Buckley is following her Decadron  taper starting 09/05/2023. Instructed patient to call [phone] with needs related to treatment after hours or over the weekend. Patient verbalized understanding.   **pt name** rested with us  for 15 minutes following SRS treatment.  Patient denies headache, dizziness, nausea, diplopia or ringing in the ears. Denies fatigue. Patient without complaints. Understands to avoid strenuous activity for the next 24 hours and call [phone] with needs   BP 110/76 (BP Location: Right Arm, Patient Position: Sitting)   Pulse 81   Temp 98 F (36.7 C) (Temporal)   Resp 17   SpO2 100%   This concludes the interaction.  Kayla Minerva, LPN

## 2023-09-05 NOTE — Telephone Encounter (Signed)
 Pt called to discuss what plan is moving forward after recent hospitalization with brain mets found. Per MD Odean, pt should f/u with him in a couple of weeks. Pt was offered appt 7/22 at230 and accepted. Appt scheduled.

## 2023-09-06 NOTE — Radiation Completion Notes (Addendum)
  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    Intent: Palliative     ==========DELIVERED PLANS==========  First Treatment Date: 2023-08-31 Last Treatment Date: 2023-09-05   Plan Name: Brain_SRS Site: Brain PTV_1_RParietal_38mm PTV_2_LTemporal_52mm PTV_3_LFrontal_21mm PTV_4_LThalamus_80mm PTV_5_PostSupCerebel_38mm PTV_6_RCerebellum_36mm Technique: SBRT/SRT-IMRT Mode: Photon Dose Per Fraction: 9 Gy Prescribed Dose (Delivered / Prescribed): 27 Gy / 27 Gy Prescribed Fxs (Delivered / Prescribed): 3 / 3     ==========ON TREATMENT VISIT DATES========== 2023-08-31, 2023-09-02, 2023-09-05    See weekly On Treatment Notes in Epic for details in the Media tab (listed as Progress notes on the On Treatment Visit Dates listed above). The patient tolerated radiation. Her steroids were tapered by Dr. Buckley.  The patient will receive a call in about one month from the radiation oncology department. She will continue follow up with Dr. Buckley as well as Dr. Gudena.     Donald KYM Husband, PAC

## 2023-09-12 NOTE — Assessment & Plan Note (Signed)
 stage IIIa triple positive breast cancer.   Treatment history: 1.  Initial diagnosis with T2N3 triple positive breast cancer PET scan showed regional adenopathy with no distant metastasis. 2.  Neoadjuvant chemotherapy with TCHP x6 from 07/23/2020 through 11/06/2020 3.  2 doses of Herceptin  on 11/27/2020 and 12/18/2020 4.  Bilateral mastectomies on January 07, 2021 which showed T2 N1 MI residual cancer. 5.  Patient began adjuvant Kadcyla  x14 cycles beginning January 26, 2021 through 10/14/2021 6.  Adjuvant radiation completed January 25 7.  Followed by adjuvant antiestrogen therapy with Zoladex  and anastrozole  8. Hospitalization: 08/11/23: Seizure: Brain Mets S/P SRS, CT CAP 08/12/23: No mets (inflamed colon) ______________________________________________________________________ Discussed treatment with Xeloda , Herceptin  and Tucatinib Vs Enhertu vs Observation

## 2023-09-13 ENCOUNTER — Inpatient Hospital Stay: Attending: Radiation Oncology | Admitting: Hematology and Oncology

## 2023-09-13 ENCOUNTER — Other Ambulatory Visit (HOSPITAL_COMMUNITY): Payer: Self-pay

## 2023-09-13 ENCOUNTER — Encounter: Payer: Self-pay | Admitting: Hematology and Oncology

## 2023-09-13 ENCOUNTER — Telehealth: Payer: Self-pay

## 2023-09-13 DIAGNOSIS — Z923 Personal history of irradiation: Secondary | ICD-10-CM | POA: Diagnosis not present

## 2023-09-13 DIAGNOSIS — K529 Noninfective gastroenteritis and colitis, unspecified: Secondary | ICD-10-CM

## 2023-09-13 DIAGNOSIS — E279 Disorder of adrenal gland, unspecified: Secondary | ICD-10-CM | POA: Insufficient documentation

## 2023-09-13 DIAGNOSIS — C7931 Secondary malignant neoplasm of brain: Secondary | ICD-10-CM | POA: Diagnosis not present

## 2023-09-13 DIAGNOSIS — C50211 Malignant neoplasm of upper-inner quadrant of right female breast: Secondary | ICD-10-CM | POA: Diagnosis not present

## 2023-09-13 DIAGNOSIS — Z85850 Personal history of malignant neoplasm of thyroid: Secondary | ICD-10-CM | POA: Insufficient documentation

## 2023-09-13 DIAGNOSIS — Z170 Estrogen receptor positive status [ER+]: Secondary | ICD-10-CM | POA: Insufficient documentation

## 2023-09-13 MED ORDER — NERATINIB MALEATE 40 MG PO TABS: 240.0000 mg | ORAL_TABLET | Freq: Every day | ORAL | 0 refills | Status: DC

## 2023-09-13 NOTE — Telephone Encounter (Signed)
 Oral Oncology Patient Advocate Encounter   Received notification that prior authorization for Nerlynx  is required.   PA submitted on 09/13/23 Key: [id number]  Status is pending      Charlott Hamilton,  CPhT-Adv  she/her/hers [facility]  [facility] Pharm[facility] Services Pharmacy Technician Patient Advocate Specialist III [facility] Phone: [phone]  Fax: [phone] [email or web address]

## 2023-09-13 NOTE — Progress Notes (Signed)
 Patient Care Team: [facility], [facility] [place] as PCP - General (Family Medicine) Dewey Rush, MD as Consulting Physician (Radiation Oncology) Eletha Boas, MD as Consulting Physician (General Surgery) Odean Potts, MD as Consulting Physician (Hematology and Oncology) Curvin Deward MOULD, MD as Consulting Physician (General Surgery) Faythe Purchase, MD as Consulting Physician (Endocrinology) Gorge Ade, MD as Consulting Physician (Obstetrics and Gynecology)  DIAGNOSIS:  Encounter Diagnoses  Name Primary?   Malignant neoplasm of upper-inner quadrant of right breast in female, estrogen receptor positive (HCC) Yes   Metastasis to brain (HCC)    Colitis     SUMMARY OF ONCOLOGIC HISTORY: Oncology History  Malignant neoplasm of upper-inner quadrant of right breast in female, estrogen receptor positive (HCC)  07/08/2020 Initial Diagnosis   a clinical mT2 N3, stage IIIA invasive ductal carcinoma, grade 1, triple positive  (a) breast MRI 07/06/2020 finds a 1.6 cm lower inner quadrant mass associated with 5.4 cm of non-mass-like enhancement, at least 8 enlarged right axillary lymph node and 1 right internal mammary lymph node  (b) CT scans of the neck and chest 06/24/2020 shows subcentimeter right cervical adenopathy, 2 right supraclavicular lymph nodes the larger 0.65 cm, also left cervical adenopathy largest measuring 1.02 cm; there is also a 1.2 cm lesion in the left hepatic lobe, and a sclerotic lesion in the mid thoracic spine at T7  (c) PET scan 07/21/2020 shows regional adenopathy (including 2 right subcentimeter supraclavicular lymph nodes and subcentimeter lymph nodes in the right hilar, subcarinal and lateral aortic region), no uptake in liver, and an indeterminate T12 lesion  (d) thoracic spine MRI 07/27/2020 shows the T12 lesion to be likely benign, there is also degenerative disc disease   07/23/2020 - 12/18/2020 Neo-Adjuvant Chemotherapy   neoadjuvant chemotherapy will  consist of carboplatin , docetaxel , trastuzumab , and pertuzumab  q. 21 days x 6 07/23/2020-11/06/2020 --two doses of Trastuzumab  given on 9/22 and 10/13   01/07/2021 Cancer Staging   Staging form: Breast, AJCC 8th Edition - Pathologic stage from 01/07/2021: No Stage Recommended (ypT2, pN41mi, cM0, G2) - Signed by Crawford Morna Pickle, NP on 01/15/2021 Stage prefix: Post-therapy Histologic grading system: 3 grade system   01/07/2021 Surgery   She underwent bilateral mastectomy on 01/07/2021 that showed: left breast no evidence of malignancy, and the right breast invasive ductal carcinoma grade 2, spanning fibrotic area of approximately 5 cm in patches.  Margins were negative.   (A) Microscopic metastatic carcinoma was noted in 1 out of 15 lymph nodes biopsied.   01/26/2021 - 10/14/2021 Chemotherapy   Patient is on Treatment Plan : BREAST ADO-Trastuzumab Emtansine  (Kadcyla ) q21d     02/13/2021 - 04/01/2021 Radiation Therapy   Site Technique Total Dose (Gy) Dose per Fx (Gy) Completed Fx Beam Energies  Chest Wall, Right: CW_R 3D 50.4/50.4 1.8 28/28 6XFFF, 10X  Chest Wall, Right: CW_R_SCLV 3D 50.4/50.4 1.8 28/28 6X, 10X  Chest Wall, Right: CW_R_Bst Electron 10/10 2 5/5 6E     04/08/2021 -  Anti-estrogen oral therapy   Antiestrogen therapy with Zolodex and AI starting 04/21/21     CHIEF COMPLIANT: Follow-up to discuss treatment plan since completion of radiation to the brain  HISTORY OF PRESENT ILLNESS:   History of Present Illness Kayla Little is a 38 year old female with a history of breast cancer and thyroid  cancer who presents for follow-up after radiation therapy. She is accompanied by her mother-in-law, Rojelio, a retired Publishing rights manager.  She has not experienced further seizures following recent radiation therapy  and is tapering off steroids, currently at 2 mg per day, which have caused a 20-pound weight gain. Despite this, she remains active, having recently visited [address]  and engaged in physical activities like walking and playing pickleball.  Her breast cancer initially presented with a lymph node on her collarbone. She previously received letrozole and Zoladex , discontinuing letrozole due to adverse effects and has not taken Zoladex  since October 2023. She is on levothyroxine  following thyroid  removal, which revealed medullary thyroid  cancer. 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  A PET scan in June 2022 showed a T12 vertebral body focal site of hypermetabolism, followed by an MRI in May 2022. She has not had a recent bone scan.  She experiences balance issues and vision changes, attributing them to aging. She has loose stools in the morning after coffee but no significant diarrhea. She has not had a colonoscopy yet. She takes Vyvanse  for ADD, which helps with fatigue.  She has a strong support system, including her husband, who requires FMLA paperwork for work flexibility due to her health needs.     ALLERGIES:  has no known allergies.  MEDICATIONS:  Current Outpatient Medications  Medication Sig Dispense Refill   acetaminophen  (TYLENOL ) 325 MG tablet Take 2 tablets (650 mg total) by mouth every 4 (four) hours as needed (for pain scale < 4). 100 tablet 0   dexamethasone  (DECADRON ) 1 MG tablet Take 3 tablets (3 mg total) by mouth daily with breakfast for 7 days, THEN 2 tablets (2 mg total) daily with breakfast for 7 days, THEN 1 tablet (1 mg total) daily with breakfast for 7 days. 42 tablet 0   diazePAM , 20 MG Dose, 2 x 10 MG/0.1ML LQPK Place 20 mg into the nose once as needed for up to 1 dose. Use for seizure lasting greater than 2 minutes 2 each 0   levETIRAcetam  (KEPPRA ) 1000 MG tablet Take 1 tablet (1,000 mg total) by mouth 2 (two) times daily. 60 tablet 3   levonorgestrel (MIRENA) 20 MCG/DAY IUD 1 each by Intrauterine route once.     levothyroxine  (SYNTHROID ) 175 MCG tablet  Take 175 mcg by mouth daily before breakfast.     LORazepam  (ATIVAN ) 0.5 MG tablet Take 1 tablet (0.5 mg total) by mouth every 12 (twelve) hours as needed for anxiety. 30 tablet 0   Neratinib  Maleate (NERLYNX ) 40 MG tablet Take 6 tablets (240 mg total) by mouth daily. Start 3 tabs daily for 2 weeks then increase to 4 tabs daily for 2 weeks and then to 5 tabs for 2 weeks and then 6 tabs daily 180 tablet 0   sertraline  (ZOLOFT ) 50 MG tablet Take 50 mg by mouth at bedtime.     VYVANSE  60 MG capsule Take 60 mg by mouth every morning.     No current facility-administered medications for this visit.   Facility-Administered Medications Ordered in Other Visits  Medication Dose Route Frequency Provider Last Rate Last Admin   sodium chloride  flush (NS) 0.9 % injection 10 mL  10 mL Intravenous PRN Magrinat, Sandria BROCKS, MD        PHYSICAL EXAMINATION: ECOG PERFORMANCE STATUS: 1 - Symptomatic but completely ambulatory LABORATORY DATA:  I have reviewed the data as listed    Latest Ref Rng & Units 08/11/2023   11:09 AM 08/11/2023   11:08 AM 11/10/2021   10:48 AM  CMP  Glucose 70 - 99 mg/dL 894  894  107   BUN 6 - 20 mg/dL 10  10  8    Creatinine 0.44 - 1.00 mg/dL 9.09  8.95  9.29   Sodium 135 - 145 mmol/L 136  134  137   Potassium 3.5 - 5.1 mmol/L 4.3  4.2  3.8   Chloride 98 - 111 mmol/L 103  103  104   CO2 22 - 32 mmol/L  20  27   Calcium  8.9 - 10.3 mg/dL  8.6  8.5   Total Protein 6.5 - 8.1 g/dL  6.9  7.1   Total Bilirubin 0.0 - 1.2 mg/dL  0.7  0.7   Alkaline Phos 38 - 126 U/L  45  64   AST 15 - 41 U/L  29  43   ALT 0 - 44 U/L  24  36     Lab Results  Component Value Date   WBC 10.2 08/11/2023   HGB 14.3 08/11/2023   HCT 42.0 08/11/2023   MCV 101.2 (H) 08/11/2023   PLT 327 08/11/2023   NEUTROABS 6.6 08/11/2023    ASSESSMENT & PLAN:  Malignant neoplasm of upper-inner quadrant of right breast in female, estrogen receptor positive (HCC) stage IIIa triple positive breast cancer.   Treatment  history: 1.  Initial diagnosis with T2N3 triple positive breast cancer PET scan showed regional adenopathy with no distant metastasis. 2.  Neoadjuvant chemotherapy with TCHP x6 from 07/23/2020 through 11/06/2020 3.  2 doses of Herceptin  on 11/27/2020 and 12/18/2020 4.  Bilateral mastectomies on January 07, 2021 which showed T2 N1 MI residual cancer. 5.  Patient began adjuvant Kadcyla  x14 cycles beginning January 26, 2021 through 10/14/2021 6.  Adjuvant radiation completed January 25 7.  Followed by adjuvant antiestrogen therapy with Zoladex  and anastrozole  8. Hospitalization: 08/11/23: Seizure: Brain Mets S/P SRS, CT CAP 08/12/23: No mets (inflamed colon) ______________________________________________________________________ Discussed treatment with Xeloda , Herceptin  and Tucatinib Vs Enhertu vs neratinib  versus observation Based on her previous tumor being estrogen positive and HER2 positive recommend treating her with neratinib  with letrozole therapy  Treatment plan: PET CT scan to determine extent of systemic disease Follow-up with John in 1 week for education on neratinib  If the scans show much more extensive disease then we may have to consider switching to Enhertu.  ------------------------------------- Assessment and Plan Assessment & Plan Metastatic breast cancer with brain involvement Metastatic breast cancer with brain involvement. Recent radiation completed successfully. Concern for residual cancer cells in the brain due to limited chemotherapy penetration. Currently asymptomatic and active. - Order PET scan to assess for additional disease, particularly in the bones. - Initiate neratinib  at low dose, titrate based on tolerance, monitor for diarrhea and liver function. - Consider letrozole or anastrozole  with neratinib  if ovaries are removed. - Refer to gynecologist for ovary removal. - Schedule follow-up 2-3 days post-PET scan to discuss results and adjust treatment.  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 Influences estrogen production, supports ovary removal. - Refer to gynecologist for ovary removal.  Medullary thyroid  cancer Medullary thyroid  cancer treated with thyroidectomy. Nodules removed with clear margins. On levothyroxine  for hormone replacement.  Steroid-induced weight gain Significant weight gain due to steroid use, causing discomfort and swelling. - Continue tapering steroids, reduce to 1 mg next week, then discontinue. - Encourage physical activity for weight loss.      Orders Placed This Encounter  Procedures   NM PET Image Restag (PS) Skull Base To Thigh  Standing Status:   Future    Expected Date:   09/20/2023    Expiration Date:   09/12/2024    If indicated for the ordered procedure, I authorize the administration of a radiopharmaceutical per Radiology protocol:   Yes    Is the patient pregnant?:   No    Preferred imaging location?:   [facility]    Release to patient:   Immediate   Ambulatory referral to Gastroenterology    Referral Priority:   Routine    Referral Type:   Consultation    Referral Reason:   Specialty Services Required    Referred to Provider:   Federico Rosario BROCKS, MD    Number of Visits Requested:   1   The patient has a good understanding of the overall plan. she agrees with it. she will call with any problems that may develop before the next visit here. Total time spent: 30 mins including face to face time and time spent for planning, charting and co-ordination of care   Naomi MARLA Chad, MD 09/13/23

## 2023-09-14 ENCOUNTER — Other Ambulatory Visit: Payer: Self-pay | Admitting: Hematology and Oncology

## 2023-09-14 MED ORDER — CAPECITABINE 500 MG PO TABS: 1000.0000 mg | ORAL_TABLET | Freq: Two times a day (BID) | ORAL | 3 refills | Status: DC

## 2023-09-14 NOTE — Telephone Encounter (Signed)
 Oral Oncology Patient Advocate Encounter  Received notification that the request for prior authorization for  Nerlynx  has been denied due to Your plan only covers this drug for your health condition if it will be used with another drug Capecitabine .      Charlott Hamilton,  CPhT-Adv  she/her/hers [facility]  [facility] Specialty Pharmacy Services Pharmacy Technician Patient Advocate Specialist III [facility] Phone: [phone]  Fax: [phone] [email or web address]

## 2023-09-14 NOTE — Progress Notes (Signed)
 Since neratinib  has been approved metastatic breast cancer only in combination with capecitabine , I sent a prescription for capecitabine  as well. (NALA trial) I talked to the patient and she understands the rationale and is agreeable to the plan.

## 2023-09-15 ENCOUNTER — Encounter: Payer: Self-pay | Admitting: Pharmacist

## 2023-09-15 ENCOUNTER — Other Ambulatory Visit (HOSPITAL_COMMUNITY): Payer: Self-pay

## 2023-09-15 ENCOUNTER — Telehealth: Payer: Self-pay

## 2023-09-15 ENCOUNTER — Telehealth: Payer: Self-pay | Admitting: Hematology and Oncology

## 2023-09-15 NOTE — Telephone Encounter (Signed)
 Oral Oncology Patient Advocate Encounter  Prior Authorization for Capecitabine  has been approved.    PA# : [id number] Effective dates: 09/15/23 through 0710/26  Patient must fill at [facility].   Charlott Hamilton,  CPhT-Adv  she/her/hers [facility]  [facility] Specialty Pharmacy Services Pharmacy Technician Patient Advocate Specialist III [facility] Phone: [phone]  Fax: [phone] [email or web address]

## 2023-09-15 NOTE — Telephone Encounter (Signed)
 Oral Oncology Patient Advocate Encounter   Received notification that prior authorization for Capecitabine  is required.   PA submitted on 09/15/23 Key [id number] Status is pending      Charlott Hamilton,  CPhT-Adv  she/her/hers [facility]  [facility] Specialty Pharmacy Services Pharmacy Technician Patient Advocate Specialist III [facility] Phone: [phone]  Fax: [phone] [email or web address]

## 2023-09-15 NOTE — Telephone Encounter (Signed)
 Oral Oncology Patient Advocate Encounter   Received notification that prior authorization for Nerlynx  is required.   PA submitted on 09/15/23 Key [id number] Status is pending      Charlott Hamilton,  CPhT-Adv  she/her/hers [facility]  [facility] Specialty Pharmacy Services Pharmacy Technician Patient Advocate Specialist III [facility] Phone: [phone]  Fax: [phone] [email or web address]

## 2023-09-15 NOTE — Telephone Encounter (Signed)
 left vm for pt about scheduleld appt date and time

## 2023-09-15 NOTE — Telephone Encounter (Signed)
 Oral Oncology Patient Advocate Encounter  Prior Authorization for Nerlynx  has been approved.    PA# [id number] Effective dates: 09/15/23 through 09/14/24  [facility].   Charlott Hamilton,  CPhT-Adv  she/her/hers [facility]  [facility] Specialty Pharmacy Services Pharmacy Technician Patient Advocate Specialist III [facility] Phone: [phone]  Fax: [phone] [email or web address]

## 2023-09-19 ENCOUNTER — Telehealth: Payer: Self-pay | Admitting: Pharmacist

## 2023-09-19 NOTE — Telephone Encounter (Signed)
 Clinical Pharmacist Practitioner Encounter   Kayla Little is aware her prescription were sent to [facility]. She will contact them on 09/20/23 if she has not her from [facility] about delivery.   Patient Education I spoke with patient for overview of new oral chemotherapy medication: (Nerlynx ) neratinib  and (Xeloda ) capecitabine  for the treatment of metastatic breast cancer, planned duration until disease progression or unacceptable drug toxicity.   Counseled patient on administration, dosing, side effects, monitoring, drug-food interactions, safe handling, storage, and disposal. Patient will take: Neratinib : Start 3 tabs daily for 2 weeks then increase to 4 tabs daily for 2 weeks and then to 5 tabs for 2 weeks and then 6 tabs daily. Take with food.  Capecitabine : Take 2 tablets (1,000 mg total) by mouth 2 (two) times daily after a meal. 14 days on and 7 days off   Side effects include but not limited to:  Neratinib : diarrhea and nausea Capecitabine : diarrhea, hand-foot syndrome, mouth sores, edema, decreased wbc, fatigue, N/V  Diarrhea: Patient knows to use loperamide  as needed and call the office if they are having 4 or more loose stools per day. She knows that her neratinib  is being dose increased to help decrease the chance of diarrhea  Patient mentioned that she has baseline loose stool Hand-foot syndrome: Recommended the use of Udderly Smooth Extra Care 20 and diclofenac gel. Patient knows to report any skin changes they notice.  Mouth sores: Patient knows to request magic mouthwash if needed    Reviewed with patient importance of keeping a medication schedule and plan for any missed doses.  After discussion with patient no patient barriers to medication adherence identified.   Kayla Little voiced understanding and appreciation. All questions answered. Medication handout provided.  Provided patient with Oral Chemotherapy Navigation Clinic phone number. Patient knows  to call the office with questions or concerns. Oral Chemotherapy Navigation Clinic will continue to follow.  Kayla Little N. Koray Soter, PharmD, BCOP, CPP Hematology/Oncology Clinical Pharmacist [facility] Oral Chemotherapy Navigation Clinic [phone]  09/19/2023 12:26 PM

## 2023-09-19 NOTE — Telephone Encounter (Signed)
 Clinical Pharmacist Practitioner Encounter   Received new prescription for (Nerlynx ) neratinib  and (Xeloda ) capecitabine  for the treatment of metastatic breast cancer, planned duration until disease progression or unacceptable drug toxicity.  CMP from 08/11/23 assessed, no relevant lab abnormalities. Prescription dose and frequency assessed. MD plans to initiate neratinib  at low dose and titrate based on tolerance.   Current medication list in Epic reviewed, no DDIs with capecitabine  or neratinib  identified.   Evaluated chart and no patient barriers to medication adherence identified.   Prescription [facility]  Oral Oncology Clinic will continue to follow for insurance authorization, copayment issues, initial counseling and start date.  Patient agreed to treatment on 09/13/23 and 09/14/23 per MD documentation.  Calista Crain N. Nghia Mcentee, PharmD, BCOP, CPP Hematology/Oncology Clinical Pharmacist [facility] Oral Chemotherapy Navigation Clinic [phone]  09/19/2023 10:38 AM

## 2023-09-20 ENCOUNTER — Telehealth: Payer: Self-pay | Admitting: Hematology and Oncology

## 2023-09-20 NOTE — Telephone Encounter (Signed)
 Oral Oncology Patient Advocate Encounter  [facility] confirmed delivery date is Monday 09-26-23 for Capecitabine .  Patient is aware.

## 2023-09-20 NOTE — Telephone Encounter (Signed)
 Oral Oncology Patient Advocate Encounter  [facility] confirmed delivery date is Monday 09-26-23 for Nerlynx .  Patient is aware.

## 2023-09-20 NOTE — Telephone Encounter (Signed)
 Rescheule appointment per provider pal.  Called left VM with changes made to the upcoming appointment.

## 2023-09-25 ENCOUNTER — Other Ambulatory Visit: Payer: Self-pay | Admitting: Pharmacist

## 2023-09-26 ENCOUNTER — Telehealth: Payer: Self-pay | Admitting: Pharmacist

## 2023-09-26 ENCOUNTER — Ambulatory Visit (HOSPITAL_COMMUNITY)
Admission: RE | Admit: 2023-09-26 | Discharge: 2023-09-26 | Disposition: A | Discharge status: Home / Self Care | Source: Ambulatory Visit | Attending: Hematology and Oncology | Admitting: Hematology and Oncology

## 2023-09-26 ENCOUNTER — Encounter: Payer: Self-pay | Admitting: Hematology and Oncology

## 2023-09-26 DIAGNOSIS — Z170 Estrogen receptor positive status [ER+]: Secondary | ICD-10-CM | POA: Diagnosis present

## 2023-09-26 DIAGNOSIS — C7931 Secondary malignant neoplasm of brain: Secondary | ICD-10-CM | POA: Insufficient documentation

## 2023-09-26 DIAGNOSIS — C50211 Malignant neoplasm of upper-inner quadrant of right female breast: Secondary | ICD-10-CM | POA: Insufficient documentation

## 2023-09-26 LAB — GLUCOSE, CAPILLARY: Glucose-Capillary: 71 mg/dL (ref 70–99)

## 2023-09-26 MED ORDER — FLUDEOXYGLUCOSE F - 18 (FDG) INJECTION
9.0500 | Freq: Once | INTRAVENOUS | Status: AC
  Administered 2023-09-26: 9.05 via INTRAVENOUS

## 2023-09-26 NOTE — Telephone Encounter (Signed)
 [facility]       Telephone: [phone]?Fax: [phone]   Oncology Clinical Pharmacist Practitioner Progress Note  [name] is a 38 y.o. female with a diagnosis of metastatic breast cancer starting neratinib  + capecitabine  under the care of Dr. Vinay Gudena.   I connected with Kayla Little today by telephone and verified that I was speaking with the correct person using two patient identifiers. I discussed the limitations, risks, security and privacy concerns of performing an evaluation and management service by telemedicine and the availability of in-person appointments. The patient/caregiver expressed understanding and agreed to proceed.  Other persons participating in the visit and their role in the encounter: none   Patient's location: home  Provider's location: clinic  Ms. Bibian contacted clinical pharmacy today to ask about her neratinib  + capecitabine  and the possible interaction with levetiracetam  (Keppra ). She starts tomorrow. Discussed that this potential interaction flags as no action needed. She will see Dr. Gudena on 10/21/23 and likely see clinical pharmacy again after that.  Kayla Little participated in the discussion, expressed understanding, and voiced agreement with the above plan. All questions were answered to her satisfaction. The patient was advised to contact the clinic at [phone] with any questions or concerns prior to her return visit.  Clinical pharmacy will continue to support Kayla Little and Dr. Vinay Gudena as needed.  Shonte Soderlund A. Lucila, PharmD, BCOP, CPP  Norleen DELENA Lucila, RPH-CPP,  09/26/2023  9:51 AM   **Disclaimer: This note was dictated with voice recognition software. Similar sounding words can inadvertently be transcribed and this note may contain transcription errors which may not have been corrected upon publication of note.**

## 2023-09-27 ENCOUNTER — Ambulatory Visit: Admitting: Hematology and Oncology

## 2023-10-05 ENCOUNTER — Inpatient Hospital Stay: Admitting: Hematology and Oncology

## 2023-10-10 ENCOUNTER — Telehealth: Payer: Self-pay

## 2023-10-10 ENCOUNTER — Ambulatory Visit
Admission: RE | Admit: 2023-10-10 | Discharge: 2023-10-10 | Disposition: A | Discharge status: Home / Self Care | Source: Ambulatory Visit | Attending: Radiation Oncology | Admitting: Radiation Oncology

## 2023-10-10 MED ORDER — LEVETIRACETAM 1000 MG PO TABS
1000.0000 mg | ORAL_TABLET | Freq: Two times a day (BID) | ORAL | 3 refills | Status: AC
  Filled 2024-02-09: qty 60, 30d supply, fill #0

## 2023-10-10 NOTE — Telephone Encounter (Signed)
 Pt called to inquire about referrals MD placed for GI and GYN. Pt was advised to f/u with [facility] to establish care with Dr Barbette since Dr Gorge has retired, and to call [facility] for appt with Dr Federico. She has also requested a refill for Keppra . Per Dr Odean OK to refill since Dr Buckley is currently out of the office.

## 2023-10-10 NOTE — Progress Notes (Addendum)
  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  The patient did note fatigue during radiation. The patient did note having hair thinning or skin changes in the field of radiation during therapy. The patient is not taking dexamethasone . The patient does not have symptoms of  weakness or loss of control of the extremities. The patient does not have symptoms of headache. The patient does not have symptoms of seizure or uncontrolled movement. The patient does not have symptoms of changes in vision. The patient does not have changes in speech. The patient does not have confusion.   The patient was counseled that she will be contacted by our brain and spine navigator to schedule surveillance imaging. The patient was encouraged to call if she have not received a call to schedule imaging, or if she develop concerns or questions regarding radiation. She will continue follow up with Dr. Buckley as well as Dr. Gudena.   No vitals were taking during this tele visit.  Radiation Oncology         [phone] ________________________________   Vitals were not taken for this visit.

## 2023-10-17 ENCOUNTER — Other Ambulatory Visit: Payer: Self-pay | Admitting: Hematology and Oncology

## 2023-10-19 NOTE — Assessment & Plan Note (Signed)
 tage IIIa triple positive breast cancer.   Treatment history: 1.  Initial diagnosis with T2N3 triple positive breast cancer PET scan showed regional adenopathy with no distant metastasis. 2.  Neoadjuvant chemotherapy with TCHP x6 from 07/23/2020 through 11/06/2020 3.  2 doses of Herceptin  on 11/27/2020 and 12/18/2020 4.  Bilateral mastectomies on January 07, 2021 which showed T2 N1 MI residual cancer. 5.  Patient began adjuvant Kadcyla  x14 cycles beginning January 26, 2021 through 10/14/2021 6.  Adjuvant radiation completed January 25 7.  Followed by adjuvant antiestrogen therapy with Zoladex  and anastrozole  8. Hospitalization: 08/11/23: Seizure: Brain Mets S/P SRS, CT CAP 08/12/23: No mets (inflamed colon) ______________________________________________________________________ Discussed treatment with Xeloda , Herceptin  and Tucatinib Vs Enhertu vs neratinib  versus observation Based on her previous tumor being estrogen positive and HER2 positive recommend treating her with neratinib  with letrozole therapy   Treatment plan:  PET CT 09/28/23: Complete resolution of met activity T 12 bone met

## 2023-10-20 ENCOUNTER — Inpatient Hospital Stay

## 2023-10-20 ENCOUNTER — Inpatient Hospital Stay: Attending: Radiation Oncology | Admitting: Hematology and Oncology

## 2023-10-20 VITALS — BP 108/61 | HR 81 | Temp 98.0°F | Resp 17 | Ht 66.5 in | Wt 182.6 lb

## 2023-10-20 DIAGNOSIS — C50211 Malignant neoplasm of upper-inner quadrant of right female breast: Secondary | ICD-10-CM | POA: Diagnosis not present

## 2023-10-20 DIAGNOSIS — Z793 Long term (current) use of hormonal contraceptives: Secondary | ICD-10-CM | POA: Diagnosis not present

## 2023-10-20 DIAGNOSIS — R5383 Other fatigue: Secondary | ICD-10-CM

## 2023-10-20 DIAGNOSIS — Z170 Estrogen receptor positive status [ER+]: Secondary | ICD-10-CM | POA: Insufficient documentation

## 2023-10-20 DIAGNOSIS — Z9221 Personal history of antineoplastic chemotherapy: Secondary | ICD-10-CM | POA: Diagnosis not present

## 2023-10-20 DIAGNOSIS — E538 Deficiency of other specified B group vitamins: Secondary | ICD-10-CM | POA: Diagnosis not present

## 2023-10-20 DIAGNOSIS — Z79899 Other long term (current) drug therapy: Secondary | ICD-10-CM | POA: Diagnosis not present

## 2023-10-20 DIAGNOSIS — Z79890 Hormone replacement therapy (postmenopausal): Secondary | ICD-10-CM | POA: Insufficient documentation

## 2023-10-20 DIAGNOSIS — Z923 Personal history of irradiation: Secondary | ICD-10-CM | POA: Diagnosis not present

## 2023-10-20 DIAGNOSIS — C7931 Secondary malignant neoplasm of brain: Secondary | ICD-10-CM | POA: Insufficient documentation

## 2023-10-20 LAB — CBC WITH DIFFERENTIAL (CANCER CENTER ONLY)
Abs Immature Granulocytes: 0.06 K/uL (ref 0.00–0.07)
Basophils Absolute: 0.1 K/uL (ref 0.0–0.1)
Basophils Relative: 1 %
Eosinophils Absolute: 0.5 K/uL (ref 0.0–0.5)
Eosinophils Relative: 6 %
HCT: 40.8 % (ref 36.0–46.0)
Hemoglobin: 14.2 g/dL (ref 12.0–15.0)
Immature Granulocytes: 1 %
Lymphocytes Relative: 18 %
Lymphs Abs: 1.5 K/uL (ref 0.7–4.0)
MCH: 34.4 pg — ABNORMAL HIGH (ref 26.0–34.0)
MCHC: 34.8 g/dL (ref 30.0–36.0)
MCV: 98.8 fL (ref 80.0–100.0)
Monocytes Absolute: 0.7 K/uL (ref 0.1–1.0)
Monocytes Relative: 8 %
Neutro Abs: 5.5 K/uL (ref 1.7–7.7)
Neutrophils Relative %: 66 %
Platelet Count: 331 K/uL (ref 150–400)
RBC: 4.13 MIL/uL (ref 3.87–5.11)
RDW: 13.1 % (ref 11.5–15.5)
WBC Count: 8.3 K/uL (ref 4.0–10.5)
nRBC: 0 % (ref 0.0–0.2)

## 2023-10-20 LAB — VITAMIN B12: Vitamin B-12: 134 pg/mL — ABNORMAL LOW (ref 180–914)

## 2023-10-20 LAB — CMP (CANCER CENTER ONLY)
ALT: 17 U/L (ref 0–44)
AST: 19 U/L (ref 15–41)
Albumin: 4.6 g/dL (ref 3.5–5.0)
Alkaline Phosphatase: 47 U/L (ref 38–126)
Anion gap: 8 (ref 5–15)
BUN: 9 mg/dL (ref 6–20)
CO2: 28 mmol/L (ref 22–32)
Calcium: 8.3 mg/dL — ABNORMAL LOW (ref 8.9–10.3)
Chloride: 103 mmol/L (ref 98–111)
Creatinine: 0.79 mg/dL (ref 0.44–1.00)
GFR, Estimated: >60 mL/min
Glucose, Bld: 94 mg/dL (ref 70–99)
Potassium: 4 mmol/L (ref 3.5–5.1)
Sodium: 139 mmol/L (ref 135–145)
Total Bilirubin: 0.4 mg/dL (ref 0.0–1.2)
Total Protein: 7.2 g/dL (ref 6.5–8.1)

## 2023-10-20 MED ORDER — ONDANSETRON HCL 8 MG PO TABS: 8.0000 mg | ORAL_TABLET | Freq: Three times a day (TID) | ORAL | 3 refills | Status: AC | PRN

## 2023-10-20 NOTE — Progress Notes (Signed)
 Patient Care Team: [facility] as PCP - General (Family Medicine) Dewey Rush, MD as Consulting Physician (Radiation Oncology) Eletha Boas, MD as Consulting Physician (General Surgery) Odean Potts, MD as Consulting Physician (Hematology and Oncology) Curvin Deward MOULD, MD as Consulting Physician (General Surgery) Faythe Purchase, MD as Consulting Physician (Endocrinology) Gorge Ade, MD as Consulting Physician (Obstetrics and Gynecology)  DIAGNOSIS:  Encounter Diagnosis  Name Primary?   Malignant neoplasm of upper-inner quadrant of right breast in female, estrogen receptor positive (HCC) Yes    SUMMARY OF ONCOLOGIC HISTORY: Oncology History  Malignant neoplasm of upper-inner quadrant of right breast in female, estrogen receptor positive (HCC)  07/08/2020 Initial Diagnosis   a clinical mT2 N3, stage IIIA invasive ductal carcinoma, grade 1, triple positive  (a) breast MRI 07/06/2020 finds a 1.6 cm lower inner quadrant mass associated with 5.4 cm of non-mass-like enhancement, at least 8 enlarged right axillary lymph node and 1 right internal mammary lymph node  (b) CT scans of the neck and chest 06/24/2020 shows subcentimeter right cervical adenopathy, 2 right supraclavicular lymph nodes the larger 0.65 cm, also left cervical adenopathy largest measuring 1.02 cm; there is also a 1.2 cm lesion in the left hepatic lobe, and a sclerotic lesion in the mid thoracic spine at T7  (c) PET scan 07/21/2020 shows regional adenopathy (including 2 right subcentimeter supraclavicular lymph nodes and subcentimeter lymph nodes in the right hilar, subcarinal and lateral aortic region), no uptake in liver, and an indeterminate T12 lesion  (d) thoracic spine MRI 07/27/2020 shows the T12 lesion to be likely benign, there is also degenerative disc disease   07/23/2020 - 12/18/2020 Neo-Adjuvant Chemotherapy   neoadjuvant chemotherapy will consist of carboplatin , docetaxel ,  trastuzumab , and pertuzumab  q. 21 days x 6 07/23/2020-11/06/2020 --two doses of Trastuzumab  given on 9/22 and 10/13   01/07/2021 Cancer Staging   Staging form: Breast, AJCC 8th Edition - Pathologic stage from 01/07/2021: No Stage Recommended (ypT2, pN74mi, cM0, G2) - Signed by Crawford Morna Pickle, NP on 01/15/2021 Stage prefix: Post-therapy Histologic grading system: 3 grade system   01/07/2021 Surgery   She underwent bilateral mastectomy on 01/07/2021 that showed: left breast no evidence of malignancy, and the right breast invasive ductal carcinoma grade 2, spanning fibrotic area of approximately 5 cm in patches.  Margins were negative.   (A) Microscopic metastatic carcinoma was noted in 1 out of 15 lymph nodes biopsied.   01/26/2021 - 10/14/2021 Chemotherapy   Patient is on Treatment Plan : BREAST ADO-Trastuzumab Emtansine  (Kadcyla ) q21d     02/13/2021 - 04/01/2021 Radiation Therapy   Site Technique Total Dose (Gy) Dose per Fx (Gy) Completed Fx Beam Energies  Chest Wall, Right: CW_R 3D 50.4/50.4 1.8 28/28 6XFFF, 10X  Chest Wall, Right: CW_R_SCLV 3D 50.4/50.4 1.8 28/28 6X, 10X  Chest Wall, Right: CW_R_Bst Electron 10/10 2 5/5 6E     04/08/2021 -  Anti-estrogen oral therapy   Antiestrogen therapy with Zolodex and AI starting 04/21/21     CHIEF COMPLIANT: Follow-up on neratinib  and Xeloda   HISTORY OF PRESENT ILLNESS:   History of Present Illness Kayla Little is a 38 year old female with metastatic cancer who presents for follow-up regarding her current treatment regimen.  She experiences diarrhea since increasing her medication to four pills, managed with Imodium , which occasionally leads to constipation. Intermittent nausea occurs, relieved by lying down. Her current regimen includes neratinib  and Xeloda , with recent resumption of Xeloda  after a week off. She notices  dry, cracking skin on her hands and feet, possibly due to medication and sun exposure.  She feels tired and  fatigued, considering medication changes, seasonal changes, and family stress as contributing factors. She is interested in checking her B12 levels and liver function due to her medication regimen.     ALLERGIES:  has no known allergies.  MEDICATIONS:  Current Outpatient Medications  Medication Sig Dispense Refill   acetaminophen  (TYLENOL ) 325 MG tablet Take 2 tablets (650 mg total) by mouth every 4 (four) hours as needed (for pain scale < 4). 100 tablet 0   capecitabine  (XELODA ) 500 MG tablet Take 2 tablets (1,000 mg total) by mouth 2 (two) times daily after a meal. 14 days on and 7 days off 56 tablet 3   diazePAM , 20 MG Dose, 2 x 10 MG/0.1ML LQPK Place 20 mg into the nose once as needed for up to 1 dose. Use for seizure lasting greater than 2 minutes 2 each 0   levETIRAcetam  (KEPPRA ) 1000 MG tablet Take 1 tablet (1,000 mg total) by mouth 2 (two) times daily. 60 tablet 3   levonorgestrel (MIRENA) 20 MCG/DAY IUD 1 each by Intrauterine route once.     levothyroxine  (SYNTHROID ) 175 MCG tablet Take 175 mcg by mouth daily before breakfast.     LORazepam  (ATIVAN ) 0.5 MG tablet Take 1 tablet (0.5 mg total) by mouth every 12 (twelve) hours as needed for anxiety. 30 tablet 0   NERLYNX  40 MG tablet START BY TAKING 3 TABLETS BY MOUTH DAILY FOR 2 WEEKS, THEN INCREASE TO 4 TABLETS DAILY FOR 2 WEEKS, THEN 5 TABLETS DAILY FOR 2 WEEKS, AND THEN 6 TABLETS BY MOUTH DAILY THEREAFTER. 180 tablet 0   sertraline  (ZOLOFT ) 50 MG tablet Take 50 mg by mouth at bedtime.     VYVANSE  60 MG capsule Take 60 mg by mouth every morning.     No current facility-administered medications for this visit.   Facility-Administered Medications Ordered in Other Visits  Medication Dose Route Frequency Provider Last Rate Last Admin   sodium chloride  flush (NS) 0.9 % injection 10 mL  10 mL Intravenous PRN Magrinat, Sandria BROCKS, MD        PHYSICAL EXAMINATION: ECOG PERFORMANCE STATUS: 1 - Symptomatic but completely ambulatory  There  were no vitals filed for this visit. There were no vitals filed for this visit.  Physical Exam   (exam performed in the presence of a chaperone)  LABORATORY DATA:  I have reviewed the data as listed    Latest Ref Rng & Units 08/11/2023   11:09 AM 08/11/2023   11:08 AM 11/10/2021   10:48 AM  CMP  Glucose 70 - 99 mg/dL 894  894  892   BUN 6 - 20 mg/dL 10  10  8    Creatinine 0.44 - 1.00 mg/dL 9.09  8.95  9.29   Sodium 135 - 145 mmol/L 136  134  137   Potassium 3.5 - 5.1 mmol/L 4.3  4.2  3.8   Chloride 98 - 111 mmol/L 103  103  104   CO2 22 - 32 mmol/L  20  27   Calcium  8.9 - 10.3 mg/dL  8.6  8.5   Total Protein 6.5 - 8.1 g/dL  6.9  7.1   Total Bilirubin 0.0 - 1.2 mg/dL  0.7  0.7   Alkaline Phos 38 - 126 U/L  45  64   AST 15 - 41 U/L  29  43   ALT 0 - 44  U/L  24  36     Lab Results  Component Value Date   WBC 10.2 08/11/2023   HGB 14.3 08/11/2023   HCT 42.0 08/11/2023   MCV 101.2 (H) 08/11/2023   PLT 327 08/11/2023   NEUTROABS 6.6 08/11/2023    ASSESSMENT & PLAN:  Malignant neoplasm of upper-inner quadrant of right breast in female, estrogen receptor positive (HCC) tage IIIa triple positive breast cancer.   Treatment history: 1.  Initial diagnosis with T2N3 triple positive breast cancer PET scan showed regional adenopathy with no distant metastasis. 2.  Neoadjuvant chemotherapy with TCHP x6 from 07/23/2020 through 11/06/2020 3.  2 doses of Herceptin  on 11/27/2020 and 12/18/2020 4.  Bilateral mastectomies on January 07, 2021 which showed T2 N1 MI residual cancer. 5.  Patient began adjuvant Kadcyla  x14 cycles beginning January 26, 2021 through 10/14/2021 6.  Adjuvant radiation completed January 25 7.  Followed by adjuvant antiestrogen therapy with Zoladex  and anastrozole  8. Hospitalization: 08/11/23: Seizure: Brain Mets S/P SRS, CT CAP 08/12/23: No mets (inflamed colon) ______________________________________________________________________ Current recommend: Xeloda  with neratinib   started 09/27/2023 Toxicities: Intermittent diarrhea well-controlled with Imodium : I encouraged her to stay on neratinib  4 tablets a day. Mild nausea: Sent a prescription for Zofran  Dryness of the hands secondary to Xeloda  We would like to check her labs today and then see her back in 2 months with labs.  PET CT 09/28/23: Complete resolution of met activity T 12 bone met   Assessment & Plan Metastatic breast cancer with possible brain and bone involvement PET scan showed good response. T12 lesion appeared sclerotic, possibly due to sclerosis from medication or metastasis. Bone biopsy may not be diagnostic. Current treatment with neratinib  and Xeloda  effective for brain metastasis. She preferred to avoid anti-estrogen therapy due to side effects. - Continue neratinib  at current dose of four pills. - Continue Xeloda  as per current cycle. - Monitor with MRIs and PET scans. - Consider reducing medication if sustained good results.  Chemotherapy-induced diarrhea Diarrhea associated with increased neratinib  dose, managed with Imodium . Overuse led to constipation. - Continue Imodium  as needed for diarrhea, 15 mL liquid form.  Chemotherapy-induced nausea Nausea possibly due to Xeloda , intermittent and managed by lying down. Discussed need for anti-nausea medication. - Prescribe Zofran  (ondansetron ) in pill form for nausea.  Hand-foot syndrome due to chemotherapy Mild hand-foot syndrome with dry, cracking skin on hands and feet, exacerbated by beach exposure. Managed with lotion. Potential to discontinue Xeloda  if symptoms worsen. - Continue using lotion for dry skin. - Monitor for worsening symptoms, consider discontinuing Xeloda  if symptoms worsen.  Fatigue under cancer treatment Fatigue possibly related to cancer treatment, recent cessation of steroids, or other factors such as stress and weather.  Vitamin B12 deficiency Vitamin B12 deficiency noted. - Check vitamin B12 levels in  labs.      No orders of the defined types were placed in this encounter.  The patient has a good understanding of the overall plan. she agrees with it. she will call with any problems that may develop before the next visit here. Total time spent: 30 mins including face to face time and time spent for planning, charting and co-ordination of care   Naomi MARLA Chad, MD 10/20/23

## 2023-10-21 ENCOUNTER — Telehealth: Payer: Self-pay | Admitting: *Deleted

## 2023-10-21 LAB — CANCER ANTIGEN 27.29: CA 27.29: <9 U/mL (ref 0.0–38.6)

## 2023-10-21 LAB — THYROID PANEL WITH TSH
Free Thyroxine Index: 3.3 (ref 1.2–4.9)
T3 Uptake Ratio: 30 % (ref 24–39)
T4, Total: 11.1 ug/dL (ref 4.5–12.0)
TSH: 6.12 u[IU]/mL — ABNORMAL HIGH (ref 0.450–4.500)

## 2023-10-21 NOTE — Telephone Encounter (Signed)
 Received call from pt requesting to review with B12 labs with MD and the need for supplementation. Per MD pt needing to take OTC Vitamin B12 sublingual 2,000 mcg daily and re check in 2 months.  Pt educated and verbalized understanding.

## 2023-10-21 NOTE — Telephone Encounter (Signed)
 Error

## 2023-11-08 ENCOUNTER — Emergency Department (HOSPITAL_COMMUNITY)
Admission: EM | Admit: 2023-11-08 | Discharge: 2023-11-09 | Disposition: A | Discharge status: Home / Self Care | Attending: Emergency Medicine | Admitting: Emergency Medicine

## 2023-11-08 ENCOUNTER — Other Ambulatory Visit: Payer: Self-pay

## 2023-11-08 ENCOUNTER — Encounter (HOSPITAL_COMMUNITY): Payer: Self-pay

## 2023-11-08 DIAGNOSIS — Y9373 Activity, racquet and hand sports: Secondary | ICD-10-CM | POA: Diagnosis not present

## 2023-11-08 DIAGNOSIS — Z85841 Personal history of malignant neoplasm of brain: Secondary | ICD-10-CM | POA: Diagnosis not present

## 2023-11-08 DIAGNOSIS — W01198A Fall on same level from slipping, tripping and stumbling with subsequent striking against other object, initial encounter: Secondary | ICD-10-CM | POA: Insufficient documentation

## 2023-11-08 DIAGNOSIS — S0003XA Contusion of scalp, initial encounter: Secondary | ICD-10-CM | POA: Diagnosis not present

## 2023-11-08 DIAGNOSIS — Z853 Personal history of malignant neoplasm of breast: Secondary | ICD-10-CM | POA: Insufficient documentation

## 2023-11-08 DIAGNOSIS — S0990XA Unspecified injury of head, initial encounter: Secondary | ICD-10-CM | POA: Diagnosis present

## 2023-11-08 DIAGNOSIS — W19XXXA Unspecified fall, initial encounter: Secondary | ICD-10-CM

## 2023-11-08 MED ORDER — ACETAMINOPHEN 325 MG PO TABS
650.0000 mg | ORAL_TABLET | Freq: Once | ORAL | Status: AC
  Administered 2023-11-09: 650 mg via ORAL
  Filled 2023-11-08: qty 2

## 2023-11-08 NOTE — ED Provider Notes (Signed)
 [facility]ENC[facility] DEPART[facility] AT [facility] Provider Note   CSN: [id number] Arrival date & time: 11/08/23  2204     Patient presents with: Kayla Little is a 38 y.o. female.  {Add pertinent medical, surgical, social history, OB history to YEP:67052} The history is provided by the patient and medical records.  [place]  Kayla Little is a 38 y.o. female who presents to the Emergency Department complaining of *** Pickleball Occiptial head hit after striking bottom. No neck pain.  Walking okay.       Prior to Admission medications   Medication Sig Start Date End Date Taking? Authorizing Provider  acetaminophen  (TYLENOL ) 325 MG tablet Take 2 tablets (650 mg total) by mouth every 4 (four) hours as needed (for pain scale < 4). 08/23/18   Sigmon, Hoy BROCKS, CNM  capecitabine  (XELODA ) 500 MG tablet Take 2 tablets (1,000 mg total) by mouth 2 (two) times daily after a meal. 14 days on and 7 days off 09/14/23   Gudena, Vinay, MD  diazePAM , 20 MG Dose, 2 x 10 MG/0.1ML LQPK Place 20 mg into the nose once as needed for up to 1 dose. Use for seizure lasting greater than 2 minutes 08/12/23   Patsy Lenis, MD  levETIRAcetam  (KEPPRA ) 1000 MG tablet Take 1 tablet (1,000 mg total) by mouth 2 (two) times daily. 10/10/23   Gudena, Vinay, MD  levonorgestrel (MIRENA) 20 MCG/DAY IUD 1 each by Intrauterine route once.    [provider]  levothyroxine  (SYNTHROID ) 175 MCG tablet Take 175 mcg by mouth daily before breakfast.    [provider]  LORazepam  (ATIVAN ) 0.5 MG tablet Take 1 tablet (0.5 mg total) by mouth every 12 (twelve) hours as needed for anxiety. 03/31/21   Gudena, Vinay, MD  NERLYNX  40 MG tablet START BY TAKING 3 TABLETS BY MOUTH DAILY FOR 2 WEEKS, THEN INCREASE TO 4 TABLETS DAILY FOR 2 WEEKS, THEN 5 TABLETS DAILY FOR 2 WEEKS, AND THEN 6 TABLETS BY MOUTH DAILY THEREAFTER. 10/18/23   Gudena, Vinay, MD  ondansetron  (ZOFRAN ) 8 MG tablet Take 1 tablet (8 mg total) by mouth  every 8 (eight) hours as needed for nausea or vomiting. 10/20/23   Odean Potts, MD  sertraline  (ZOLOFT ) 50 MG tablet Take 50 mg by mouth at bedtime. 01/26/17   [provider]  VYVANSE  60 MG capsule Take 60 mg by mouth every morning. 07/28/20   [provider]  prochlorperazine  (COMPAZINE ) 10 MG tablet Take 1 tablet (10 mg total) by mouth every 6 (six) hours as needed (Nausea or vomiting). 09/25/20 11/27/20  Crawford Morna Pickle, NP    Allergies: Patient has no known allergies.    Review of Systems  All other systems reviewed and are negative.   Updated Vital Signs BP 132/86 (BP Location: Right Arm)   Pulse 88   Temp 98.3 F (36.8 C) (Oral)   Resp 16   Ht 5' 6.5 (1.689 m)   Wt 82.8 kg   SpO2 98%   BMI 29.02 kg/m   Physical Exam Vitals and nursing note reviewed.  Constitutional:      Appearance: She is well-developed.  HENT:     Head: Normocephalic.     Comments: Soft tissue swelling to occipital scalp Cardiovascular:     Rate and Rhythm: Normal rate and regular rhythm.  Pulmonary:     Effort: Pulmonary effort is normal. No respiratory distress.  Abdominal:     Palpations: Abdomen is soft.  Tenderness: There is no abdominal tenderness. There is no guarding or rebound.  Musculoskeletal:        General: No tenderness.     Cervical back: Neck supple. No tenderness.  Skin:    General: Skin is warm and dry.  Neurological:     Mental Status: She is alert and oriented to person, place, and time.     Comments: No asymmetry of facial movements.  Visual fields grossly intact.  5/5 strength in all four extremities with sensation to light touch intact in all four extremities.   Psychiatric:        Behavior: Behavior normal.     (all labs ordered are listed, but only abnormal results are displayed) Labs Reviewed - No data to display  EKG: None  Radiology: No results found.  {Document cardiac monitor, telemetry assessment procedure when  appropriate:32947} Procedures   Medications Ordered in the ED - No data to display    {Click here for ABCD2, HEART and other calculators REFRESH Note before signing:1}                              Medical Decision Making  ***  {Document critical care time when appropriate  Document review of labs and clinical decision tools ie CHADS2VASC2, etc  Document your independent review of radiology images and any outside records  Document your discussion with family members, caretakers and with consultants  Document social determinants of health affecting pt's care  Document your decision making why or why not admission, treatments were needed:32947:::1}   Final diagnoses:  None    ED Discharge Orders     None

## 2023-11-08 NOTE — ED Triage Notes (Signed)
 Pt was playing pickleball and collided with another person, fell down and hit head on concrete. C/o headache. Active brain cancer and is on chemo pills, one of the pills causes low platelets. No LOC, no blood thinners.

## 2023-11-09 ENCOUNTER — Emergency Department (HOSPITAL_COMMUNITY)

## 2023-11-09 NOTE — ED Notes (Signed)
 Patient transported to CT

## 2023-11-14 ENCOUNTER — Telehealth: Payer: Self-pay

## 2023-11-14 NOTE — Telephone Encounter (Signed)
 LVM for  pt regarding her husband FMLA being completed, faxed, and confirmation received.No  Questions or concerns to be noted at this time.

## 2023-12-01 ENCOUNTER — Ambulatory Visit
Admission: RE | Admit: 2023-12-01 | Discharge: 2023-12-01 | Disposition: A | Discharge status: Home / Self Care | Source: Ambulatory Visit | Attending: Internal Medicine | Admitting: Internal Medicine

## 2023-12-01 DIAGNOSIS — C7931 Secondary malignant neoplasm of brain: Secondary | ICD-10-CM

## 2023-12-01 MED ORDER — GADOPICLENOL 0.5 MMOL/ML IV SOLN
8.0000 mL | Freq: Once | INTRAVENOUS | Status: AC | PRN
Start: 2023-12-01 — End: 2023-12-01
  Administered 2023-12-01: 8 mL via INTRAVENOUS

## 2023-12-05 ENCOUNTER — Other Ambulatory Visit: Payer: Self-pay | Admitting: Radiation Therapy

## 2023-12-05 ENCOUNTER — Inpatient Hospital Stay: Attending: Radiation Oncology | Admitting: Internal Medicine

## 2023-12-05 ENCOUNTER — Telehealth: Payer: Self-pay | Admitting: Internal Medicine

## 2023-12-05 ENCOUNTER — Inpatient Hospital Stay

## 2023-12-05 VITALS — BP 125/71 | HR 73 | Temp 97.4°F | Resp 17 | Ht 66.5 in | Wt 180.0 lb

## 2023-12-05 DIAGNOSIS — Z9013 Acquired absence of bilateral breasts and nipples: Secondary | ICD-10-CM | POA: Insufficient documentation

## 2023-12-05 DIAGNOSIS — Z923 Personal history of irradiation: Secondary | ICD-10-CM | POA: Insufficient documentation

## 2023-12-05 DIAGNOSIS — Z79811 Long term (current) use of aromatase inhibitors: Secondary | ICD-10-CM | POA: Insufficient documentation

## 2023-12-05 DIAGNOSIS — C50211 Malignant neoplasm of upper-inner quadrant of right female breast: Secondary | ICD-10-CM | POA: Diagnosis present

## 2023-12-05 DIAGNOSIS — Z9221 Personal history of antineoplastic chemotherapy: Secondary | ICD-10-CM | POA: Insufficient documentation

## 2023-12-05 DIAGNOSIS — C7931 Secondary malignant neoplasm of brain: Secondary | ICD-10-CM | POA: Insufficient documentation

## 2023-12-05 DIAGNOSIS — Z170 Estrogen receptor positive status [ER+]: Secondary | ICD-10-CM | POA: Diagnosis not present

## 2023-12-05 NOTE — Telephone Encounter (Signed)
 Scheduled the patient for her next follow-up. Called and spoke with the patient, she is aware.

## 2023-12-05 NOTE — Progress Notes (Signed)
 [facility] at [facility] [address]  [place], [place] [address] [phone]   Interval Evaluation  Date of Service: 12/05/23 Patient Name: Kayla Little Patient MRN: [id number] Patient DOB: Jul 22, 1985 Provider: Arthea MARLA Manns, MD  Identifying Statement:  Kayla Little is a 38 y.o. female with Metastasis to brain [facility]) who presents for initial consultation and evaluation regarding cancer associated neurologic deficits.    Referring Provider: [facility] [place] 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 N3, stage IIIA invasive ductal carcinoma, grade 1, triple positive  (a) breast MRI 07/06/2020 finds a 1.6 cm lower inner quadrant mass associated with 5.4 cm of non-mass-like enhancement, at least 8 enlarged right axillary lymph node and 1 right internal mammary lymph node  (b) CT scans of the neck and chest 06/24/2020 shows subcentimeter right cervical adenopathy, 2 right supraclavicular lymph nodes the larger 0.65 cm, also left cervical adenopathy largest measuring 1.02 cm; there is also a 1.2 cm lesion in the left hepatic lobe, and a sclerotic lesion in the mid thoracic spine at T7  (c) PET scan 07/21/2020 shows regional adenopathy (including 2 right subcentimeter supraclavicular lymph nodes and subcentimeter lymph nodes in the right hilar, subcarinal and lateral aortic region), no uptake in liver, and an indeterminate T12 lesion  (d) thoracic spine MRI 07/27/2020 shows the T12 lesion to be likely benign, there is also degenerative disc disease   07/23/2020 - 12/18/2020 Neo-Adjuvant Chemotherapy   neoadjuvant chemotherapy will consist of carboplatin , docetaxel , trastuzumab , and pertuzumab  q. 21 days x 6 07/23/2020-11/06/2020 --two doses  of Trastuzumab  given on 9/22 and 10/13   01/07/2021 Cancer Staging   Staging form: Breast, AJCC 8th Edition - Pathologic stage from 01/07/2021: No Stage Recommended (ypT2, pN43mi, cM0, G2) - Signed by Crawford Morna Pickle, NP on 01/15/2021 Stage prefix: Post-therapy Histologic grading system: 3 grade system   01/07/2021 Surgery   She underwent bilateral mastectomy on 01/07/2021 that showed: left breast no evidence of malignancy, and the right breast invasive ductal carcinoma grade 2, spanning fibrotic area of approximately 5 cm in patches.  Margins were negative.   (A) Microscopic metastatic carcinoma was noted in 1 out of 15 lymph nodes biopsied.   01/26/2021 - 10/14/2021 Chemotherapy   Patient is on Treatment Plan : BREAST ADO-Trastuzumab Emtansine  (Kadcyla ) q21d     02/13/2021 - 04/01/2021 Radiation Therapy   Site Technique Total Dose (Gy) Dose per Fx (Gy) Completed Fx Beam Energies  Chest Wall, Right: CW_R 3D 50.4/50.4 1.8 28/28 6XFFF, 10X  Chest Wall, Right: CW_R_SCLV 3D 50.4/50.4 1.8 28/28 6X, 10X  Chest Wall, Right: CW_R_Bst Electron 10/10 2 5/5 6E     04/08/2021 -  Anti-estrogen oral therapy   Antiestrogen therapy with Zolodex and AI starting 04/21/21    CNS Oncologic History 08/31/23: SRS for 6 metastases [facility])  Interval History: Kayla Little presents today for follow up after recent MRI brain.  Doesn't report further seizures. Continues on Keppra  1000mg  twice per day without issue.  Continues on Neratinib  with Dr. Gudena without significant issues.  H+P (08/29/23) Patient presents today for evaluation after CNS complaints, brain MRI.  She describes an episode of unable to speak on 08/11/23, lasting for hour before return to prior baseline.  CNS imaging was obtained and  demonstrated several brain metastases from breast primary.  Was started on Keppra  1250mg  BID for presumed seizure, as well as decadron  4mg  three times per day.  She has been off systemic therapy for some time,  previously had followed with Dr. Gudena.  No neurologic complaints today.  Medications: Current Outpatient Medications on File Prior to Visit  Medication Sig Dispense Refill   acetaminophen  (TYLENOL ) 325 MG tablet Take 2 tablets (650 mg total) by mouth every 4 (four) hours as needed (for pain scale < 4). 100 tablet 0   capecitabine  (XELODA ) 500 MG tablet Take 2 tablets (1,000 mg total) by mouth 2 (two) times daily after a meal. 14 days on and 7 days off 56 tablet 3   diazePAM , 20 MG Dose, 2 x 10 MG/0.1ML LQPK Place 20 mg into the nose once as needed for up to 1 dose. Use for seizure lasting greater than 2 minutes 2 each 0   levETIRAcetam  (KEPPRA ) 1000 MG tablet Take 1 tablet (1,000 mg total) by mouth 2 (two) times daily. 60 tablet 3   levonorgestrel (MIRENA) 20 MCG/DAY IUD 1 each by Intrauterine route once.     levothyroxine  (SYNTHROID ) 175 MCG tablet Take 175 mcg by mouth daily before breakfast.     LORazepam  (ATIVAN ) 0.5 MG tablet Take 1 tablet (0.5 mg total) by mouth every 12 (twelve) hours as needed for anxiety. 30 tablet 0   NERLYNX  40 MG tablet START BY TAKING 3 TABLETS BY MOUTH DAILY FOR 2 WEEKS, THEN INCREASE TO 4 TABLETS DAILY FOR 2 WEEKS, THEN 5 TABLETS DAILY FOR 2 WEEKS, AND THEN 6 TABLETS BY MOUTH DAILY THEREAFTER. 180 tablet 0   ondansetron  (ZOFRAN ) 8 MG tablet Take 1 tablet (8 mg total) by mouth every 8 (eight) hours as needed for nausea or vomiting. 20 tablet 3   sertraline  (ZOLOFT ) 50 MG tablet Take 50 mg by mouth at bedtime.     VYVANSE  60 MG capsule Take 60 mg by mouth every morning.     [DISCONTINUED] prochlorperazine  (COMPAZINE ) 10 MG tablet Take 1 tablet (10 mg total) by mouth every 6 (six) hours as needed (Nausea or vomiting). 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)    Hx of varicella    Kidney stone 2012   Medullary thyroid  carcinoma (HCC)    Monoallelic mutation of CHEK2 gene in female patient    Postpartum care following vaginal delivery (7/27) 10/02/2014, June 16 , 2020   Second degree perineal laceration 08/23/2018   SVD (spontaneous vaginal delivery) 08/23/2018   Thyroid  cancer (HCC) 2021   Past Surgical History:  Past Surgical History:  Procedure Laterality Date   IR IMAGING GUIDED PORT INSERTION  07/22/2020   IR REMOVAL TUN ACCESS W/ PORT W/O FL MOD SED  11/12/2021  MODIFIED MASTECTOMY Right 01/07/2021   Procedure: RIGHT MODIFIED RADICAL MASTECTOMY;  Surgeon: Curvin Deward MOULD, MD;  Location: [facility];  Service: General;  Laterality: Right;   NO PAST SURGERIES     SIMPLE MASTECTOMY WITH AXILLARY SENTINEL NODE BIOPSY Left 01/07/2021   Procedure: LEFT SIMPLE MASTECTOMY;  Surgeon: Curvin Deward MOULD, MD;  Location: [facility];  Service: General;  Laterality: Left;   THYROIDECTOMY N/A 04/12/2019   Procedure: TOTAL THYROIDECTOMY WITH LIMITED LYMPH NODE DISSECTION;  Surgeon: Eletha Boas, MD;  Location: [facility];  Service: General;  Laterality: N/A;   UNILATERAL SALPINGECTOMY Right 07/09/2017   Procedure: RIGHT SALPINGECTOMY WITH REMOVAL OF ECTOPIC PREGNANCY;  Surgeon: Barbette Knock, MD;  Location: [facility];  Service: Gynecology;  Laterality: Right;   Social History:  Social History   Socioeconomic History   Marital status: Married    Spouse name: Not on file   Number of children: Not on file   Years of education: Not  on file   Highest education level: Not on file  Occupational History   Occupation: homemaker  Tobacco Use   Smoking status: Some Days    Current packs/day: 0.50    Average packs/day: 0.5 packs/day for 5.0 years (2.5 ttl pk-yrs)    Types: Cigarettes   Smokeless tobacco: Never   Tobacco comments:    Actively trying to quit.  Vaping Use   Vaping status: Never Used  Substance and Sexual Activity   Alcohol use: Yes    Alcohol/week: 7.0 - 14.0 standard drinks of alcohol    Types: 7 - 14 Glasses of wine per week    Comment: glass of wine each night    Drug use: Not Currently    Types: Marijuana    Comment: not during pregnancy, last use was 03/27/2019   Sexual activity: Yes    Birth control/protection: None, I.U.D.  Other Topics Concern   Not on file  Social History Narrative   Not on file   Social Drivers of Health   Financial Resource Strain: Low Risk  (07/10/2020)   Overall Financial Resource Strain (CARDIA)    Difficulty of Paying Living Expenses: Not hard at all  Food Insecurity: No Food Insecurity (08/12/2023)   Hunger Vital Sign    Worried About Running Out of Food in the Last Year: Never true    Ran Out of Food in the Last Year: Never true  Transportation Needs: No Transportation Needs (08/12/2023)   PRAPARE - Administrator, Civil Service (Medical): No    Lack of Transportation (Non-Medical): No  Physical Activity: Not on file  Stress: Stress Concern Present (08/11/2018)   [employer] of Occupational Health - Occupational Stress Questionnaire    Feeling of Stress : To some extent  Social Connections: Unknown (07/13/2021)   Received from [facility]   Social Network    Social Network: Not on file  Intimate Partner Violence: Not At Risk (08/12/2023)   Humiliation, Afraid, Rape, and Kick questionnaire    Fear of Current or Ex-Partner: No    Emotionally Abused: No    Physically Abused: No    Sexually Abused: No   Family History:  Family History  Problem  Relation Age of Onset   Breast cancer Mother 10       2 types of cancer one side mastectomy with chemo   Endometriosis Mother    Endometriosis Maternal Grandmother    Breast cancer Maternal Grandmother  post menopausal   Diabetes Maternal Grandmother    Cancer Maternal Grandfather        prostate and lymphoma   Alzheimer's disease Paternal Grandmother    Cancer Father 35       prostate cancer   Aneurysm Paternal Grandfather        d. 30s    Review of Systems: Constitutional: Doesn't report fevers, chills or abnormal weight loss Eyes: Doesn't report blurriness of vision Ears, nose, mouth, throat, and face: Doesn't report sore throat Respiratory: Doesn't report cough, dyspnea or wheezes Cardiovascular: Doesn't report palpitation, chest discomfort  Gastrointestinal:  Doesn't report nausea, constipation, diarrhea GU: Doesn't report incontinence Skin: Doesn't report skin rashes Neurological: Per HPI Musculoskeletal: Doesn't report joint pain Behavioral/Psych: Doesn't report anxiety  Physical Exam: Vitals:   12/05/23 1006  BP: 125/71  Pulse: 73  Resp: 17  Temp: (!) 97.4 F (36.3 C)  SpO2: 100%   KPS: 90. General: Alert, cooperative, pleasant, in no acute distress Head: Normal EENT: No conjunctival injection or scleral icterus.  Lungs: Resp effort normal Cardiac: Regular rate Abdomen: Non-distended abdomen Skin: No rashes cyanosis or petechiae. Extremities: No clubbing or edema  Neurologic Exam: Mental Status: Awake, alert, attentive to examiner. Oriented to self and environment. Language is fluent with intact comprehension.  Cranial Nerves: Visual acuity is grossly normal. Visual fields are full. Extra-ocular movements intact. No ptosis. Face is symmetric Motor: Tone and bulk are normal. Power is full in both arms and legs. Reflexes are symmetric, no pathologic reflexes present.  Sensory: Intact to light touch Gait: Normal.   Labs: I have reviewed the data  as listed    Component Value Date/Time   NA 139 10/20/2023 1205   NA 142 02/16/2017 1332   K 4.0 10/20/2023 1205   CL 103 10/20/2023 1205   CO2 28 10/20/2023 1205   GLUCOSE 94 10/20/2023 1205   BUN 9 10/20/2023 1205   BUN 9 02/16/2017 1332   CREATININE 0.79 10/20/2023 1205   CREATININE 0.82 12/04/2012 1732   CALCIUM  8.3 (L) 10/20/2023 1205   PROT 7.2 10/20/2023 1205   PROT 6.5 02/16/2017 1332   ALBUMIN  4.6 10/20/2023 1205   ALBUMIN  4.3 02/16/2017 1332   AST 19 10/20/2023 1205   ALT 17 10/20/2023 1205   ALKPHOS 47 10/20/2023 1205   BILITOT 0.4 10/20/2023 1205   GFRNONAA >60 10/20/2023 1205   GFRAA >60 04/15/2019 2145   Lab Results  Component Value Date   WBC 8.3 10/20/2023   NEUTROABS 5.5 10/20/2023   HGB 14.2 10/20/2023   HCT 40.8 10/20/2023   MCV 98.8 10/20/2023   PLT 331 10/20/2023    Imaging: EXAM: MRI BRAIN WITH AND WITHOUT CONTRAST 12/01/2023 10:37:45 AM   TECHNIQUE: Multiplanar multisequence MRI of the head/brain was performed with and without the administration of 8 ml Vueway  intravenous contrast.   COMPARISON: CT of the head dated 11/09/2023 and MRI of the head dated 08/18/2023.   CLINICAL HISTORY: Brain/CNS neoplasm, assess treatment response. Brain mets; 10ml vial, 8ml Vueway  given 2ml wasted.   FINDINGS:   BRAIN AND VENTRICLES: There has been a mixed response to interval treatment, with overall significant interval improvement of metastatic disease to the brain. A lesion previously noted posteromedially within the right parietal lobe has decreased in size from approximately 15 mm in diameter to 6 mm. It is seen on image 10 of series 13. A subtle lesion previously seen within the left mid frontal lobe is no longer apparent. A lesion previously  noted laterally within the left temporal lobe has decreased in size in the interim from approximately 15 mm in diameter to 11 mm. It is seen on image 102 of series 13. A lesion previously noted within the  left thalamus has decreased in size from 3 mm to barely visible. The residue is demonstrated on image 104. A lesion previously seen superiorly and posteriorly within the cerebellum on image 71 has decreased in diameter from 8 mm to 5 mm. A lesion present within the right cerebellar hemisphere on image 65 has also mildly decreased in size and conspicuity in the interim. There are a few new enhancing nodules present within the right occipital lobe on images 118 through 128 which appears to reflect curvilinear enhancement on the sagittal reformations measuring approximately 14 mm in length on image 16 of series 16. No acute infarct. No acute intracranial hemorrhage. No mass effect or midline shift. No hydrocephalus. The sella is unremarkable. Normal flow voids.   ORBITS: No acute abnormality.   SINUSES: No acute abnormality.   BONES AND SOFT TISSUES: Normal bone marrow signal and enhancement. No acute soft tissue abnormality.   IMPRESSION: 1. Mixed treatment response of intracranial metastases with overall significant interval decrease in size/number of prior lesions. 2. New enhancing nodules in the right occipital lobe measuring up to approximately 14 mm in length, compatible with new metastatic disease.   Electronically signed by: Evalene Coho MD 12/01/2023 11:42 AM EDT RP  Assessment/Plan Metastasis to brain [facility])  Kayla Little is clinically stable today.  MRI brain demonstrates 3 small foci of enhancement c/w novel metastases.  These do not likely represent new seeding from failure of systemic therapy, more likely previously under-visualized mets beneath MRI detection level.     Case was reviewed in CNS tumor board, recommended salvage SRS to all new lesions.  She is agreeable with this.      For seizures, recommended continuing Keppra  1000mg  BID.  Should remain off decadron  if tolerated.  We appreciate the opportunity to participate in the care of Kayla Little.   We  ask that Kayla Little return to clinic in 3 months following next brain MRI, or sooner as needed.  All questions were answered. The patient knows to call the clinic with any problems, questions or concerns. No barriers to learning were detected.  The total time spent in the encounter was 40 minutes and more than 50% was on counseling and review of test results   Arthea MARLA Manns, MD Medical Director of Neuro-Oncology [facility] 12/05/23 10:07 AM

## 2023-12-07 ENCOUNTER — Ambulatory Visit
Admission: RE | Admit: 2023-12-07 | Discharge: 2023-12-07 | Disposition: A | Discharge status: Home / Self Care | Source: Ambulatory Visit | Attending: Radiation Oncology | Admitting: Radiation Oncology

## 2023-12-07 ENCOUNTER — Encounter: Payer: Self-pay | Admitting: Radiation Oncology

## 2023-12-07 VITALS — BP 115/89 | HR 72 | Temp 97.7°F | Resp 16 | Ht 66.5 in | Wt 176.8 lb

## 2023-12-07 DIAGNOSIS — C7931 Secondary malignant neoplasm of brain: Secondary | ICD-10-CM | POA: Diagnosis present

## 2023-12-07 DIAGNOSIS — Z9012 Acquired absence of left breast and nipple: Secondary | ICD-10-CM | POA: Insufficient documentation

## 2023-12-07 DIAGNOSIS — Z510 Encounter for antineoplastic radiation therapy: Secondary | ICD-10-CM | POA: Insufficient documentation

## 2023-12-07 DIAGNOSIS — F32A Depression, unspecified: Secondary | ICD-10-CM | POA: Diagnosis not present

## 2023-12-07 DIAGNOSIS — F17210 Nicotine dependence, cigarettes, uncomplicated: Secondary | ICD-10-CM | POA: Insufficient documentation

## 2023-12-07 DIAGNOSIS — C50211 Malignant neoplasm of upper-inner quadrant of right female breast: Secondary | ICD-10-CM | POA: Insufficient documentation

## 2023-12-07 DIAGNOSIS — Z170 Estrogen receptor positive status [ER+]: Secondary | ICD-10-CM

## 2023-12-07 LAB — CBC WITH DIFFERENTIAL (CANCER CENTER ONLY)
Abs Immature Granulocytes: 0.03 K/uL (ref 0.00–0.07)
Basophils Absolute: 0.1 K/uL (ref 0.0–0.1)
Basophils Relative: 1 %
Eosinophils Absolute: 0.2 K/uL (ref 0.0–0.5)
Eosinophils Relative: 3 %
HCT: 41.9 % (ref 36.0–46.0)
Hemoglobin: 14.4 g/dL (ref 12.0–15.0)
Immature Granulocytes: 0 %
Lymphocytes Relative: 23 %
Lymphs Abs: 1.7 K/uL (ref 0.7–4.0)
MCH: 34.2 pg — ABNORMAL HIGH (ref 26.0–34.0)
MCHC: 34.4 g/dL (ref 30.0–36.0)
MCV: 99.5 fL (ref 80.0–100.0)
Monocytes Absolute: 0.6 K/uL (ref 0.1–1.0)
Monocytes Relative: 8 %
Neutro Abs: 4.9 K/uL (ref 1.7–7.7)
Neutrophils Relative %: 65 %
Platelet Count: 329 K/uL (ref 150–400)
RBC: 4.21 MIL/uL (ref 3.87–5.11)
RDW: 12.3 % (ref 11.5–15.5)
WBC Count: 7.4 K/uL (ref 4.0–10.5)
nRBC: 0 % (ref 0.0–0.2)

## 2023-12-07 LAB — CMP (CANCER CENTER ONLY)
ALT: 33 U/L (ref 0–44)
AST: 29 U/L (ref 15–41)
Albumin: 4.7 g/dL (ref 3.5–5.0)
Alkaline Phosphatase: 46 U/L (ref 38–126)
Anion gap: 8 (ref 5–15)
BUN: 8 mg/dL (ref 6–20)
CO2: 24 mmol/L (ref 22–32)
Calcium: 8.1 mg/dL — ABNORMAL LOW (ref 8.9–10.3)
Chloride: 105 mmol/L (ref 98–111)
Creatinine: 0.89 mg/dL (ref 0.44–1.00)
GFR, Estimated: >60 mL/min (ref >60)
Glucose, Bld: 86 mg/dL (ref 70–99)
Potassium: 4.3 mmol/L (ref 3.5–5.1)
Sodium: 137 mmol/L (ref 135–145)
Total Bilirubin: 0.7 mg/dL (ref 0.0–1.2)
Total Protein: 7.3 g/dL (ref 6.5–8.1)

## 2023-12-07 LAB — VITAMIN B12: Vitamin B-12: 351 pg/mL (ref 180–914)

## 2023-12-07 NOTE — Progress Notes (Signed)
 Location/Histology of Brain Tumor: Brain Mets    Past or anticipated interventions, if any, per neurology oncology   Past or anticipated interventions, if any, per medical oncology:       Dose of Decadron , if applicable: no  Recent neurologic symptoms, if any:  Seizures: no Headaches: no Nausea: no Dizziness/ataxia: no Difficulty with hand coordination: no Focal numbness/weakness: no Visual deficits/changes: no Confusion/Memory deficits: no   SAFETY ISSUES: Prior radiation? yes Pacemaker/ICD/loop recorder/dexcom? no Possible current pregnancy? no Is the patient on methotrexate? no  Additional Complaints / other details:  BP 115/89 (BP Location: Left Arm, Patient Position: Sitting, Cuff Size: Large)   Pulse 72   Temp 97.7 F (36.5 C)   Resp 16   Ht 5' 6.5 (1.689 m)   Wt 176 lb 12.8 oz (80.2 kg)   SpO2 99%   BMI 28.11 kg/m

## 2023-12-07 NOTE — Progress Notes (Signed)
 Radiation Oncology         [phone] ________________________________  Name: Kayla Little        MRN: [id number]  Date of Service: 12/07/2023 DOB: 04/16/1985  RR:[facility] Medicine @ [address] [place] [facility]*     REFERRING PHYSICIAN: [place], [place] [facility]   DIAGNOSIS: The primary encounter diagnosis was Metastasis to brain [facility]). A diagnosis of Malignant neoplasm of upper-inner quadrant of right breast in female, estrogen receptor positive (HCC) was also pertinent to this visit.   HISTORY OF PRESENT ILLNESS: Kayla RIEPE is a 38 y.o. female seen at the request of Dr. Buckley for newly noted brain disease. The patient is known to our clinic and was treated for local breast cancer, but in the summer of 2025, was found To have metastatic disease to the brain. Her initial diagnosis was of Stage IIIA, cT3N3M0 grade 1, triple positive invasive ductal carcinoma of the right breast.  She was treated with neoadjuvant chemotherapy followed by left mastectomy and adjuvant radiotherapy to the chest wall and regional nodes.  She received adjuvant Kadcyla  and antiestrogen therapy with Zoladex  but has had insinuating circumstances that have kept her from routine care in the last two years. She presented to the emergency room with symptoms of aphasia on 08/11/23 and CT imaging of the head to rule out stroke showed concerns for lesions in the right parietal lobe and left temporoparietal junction with mild surrounding edema.  An MRI of the brain with and without contrast performed yesterday showed 5-6 metastatic lesions 2 in the left thalamus 1 in the right cerebral hemisphere at the parieto-occipital junction and 1 in the left cerebral hemisphere at the posterior temporal/parietal/occipital junction as well as 1 in the posterior midline cerebellum with the question of an additional lesion in the right cerebellum.  She was started on dexamethasone .  We have been asked to consider options of  radiotherapy.  The patient underwent a CT CAp on 08/12/23 that showed no evidence of recurrence in the chest abdomen or pelvis. She proceeded with SRS treatment which she completed on 09/05/23 after a planned family vacation.   She has been followed by Dr. Buckley given that she has been receiving Keppra  from her presumed seizure at presentation. No further progression was seen on her outpatietn PET scan on 09/26/23. She has been taking oral Xeloda  and Nerlynx  targeted therapy since July 2025. Of note she was seen in the ER on 11/08/23 after a fall and CT head without contrast did not show any acute abnormalities. She returned for her routinely scheduled MRI on 12/01/23. Her case was discussed in brain oncology conference and this showed a 3 mm nodular lesion in the left temporal occipital junction and a new left parietal lobe punctate lesion as well.  An additional right occipital lobe lesion measuring 14 mm was also present.  The other sites that had received radiation have either been stable to improved.  Her case and conference again had been discussed and it was felt that these 3 areas were likely microscopic at the time of her presentation in June rather than frank progression.  She is seen today to discuss an additional course of SRS.      PREVIOUS RADIATION THERAPY: Yes    08/31/23-09/05/23 Plan Name: Brain_SRS Site: Brain SRS Treatment PTV_1_RParietal_69mm PTV_2_LTemporal_57mm PTV_3_LFrontal_54mm PTV_4_LThalamus_11mm PTV_5_PostSupCerebel_67mm PTV_6_RCerebellum_2mm Technique: SBRT/SRT-IMRT Mode: Photon Dose Per Fraction: 9 Gy Prescribed Dose (Delivered / Prescribed): 27 Gy / 27 Gy Prescribed Fxs (Delivered /  Prescribed): 3 / 3   02/12/2021 through 04/01/2021 Site Technique Total Dose (Gy) Dose per Fx (Gy) Completed Fx Beam Energies  Chest Wall, Right: CW_R 3D 50.4/50.4 1.8 28/28 6XFFF, 10X  Chest Wall, Right: CW_R_SCLV 3D 50.4/50.4 1.8 28/28 6X, 10X  Chest Wall, Right: CW_R_Bst Electron 10/10  2 5/5 6E     PAST MEDICAL HISTORY:  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)    Hx of varicella    Kidney stone 2012   Medullary thyroid  carcinoma (HCC)    Monoallelic mutation of CHEK2 gene in female patient    Postpartum care following vaginal delivery (7/27) 10/02/2014, June 16 , 2020   Second degree perineal laceration 08/23/2018   SVD (spontaneous vaginal delivery) 08/23/2018   Thyroid  cancer (HCC) 2021       PAST SURGICAL HISTORY: Past Surgical History:  Procedure Laterality Date   IR IMAGING GUIDED PORT INSERTION  07/22/2020   IR REMOVAL TUN ACCESS W/ PORT W/O FL MOD SED  11/12/2021   MODIFIED MASTECTOMY Right 01/07/2021   Procedure: RIGHT MODIFIED RADICAL MASTECTOMY;  Surgeon: Curvin Deward MOULD, MD;  Location: [facility];  Service: General;  Laterality: Right;   NO PAST SURGERIES     SIMPLE MASTECTOMY WITH AXILLARY SENTINEL NODE BIOPSY Left 01/07/2021   Procedure: LEFT SIMPLE MASTECTOMY;  Surgeon: Curvin Deward MOULD, MD;  Location: [facility];  Service: General;  Laterality: Left;   THYROIDECTOMY N/A 04/12/2019   Procedure: TOTAL THYROIDECTOMY WITH LIMITED LYMPH NODE DISSECTION;  Surgeon: Eletha Boas, MD;  Location: [facility];  Service: General;  Laterality: N/A;   UNILATERAL SALPINGECTOMY Right 07/09/2017   Procedure: RIGHT SALPINGECTOMY WITH REMOVAL OF ECTOPIC PREGNANCY;  Surgeon: Barbette Knock, MD;  Location: [facility];  Service: Gynecology;  Laterality: Right;     FAMILY HISTORY:  Family History  Problem Relation Age of Onset   Breast cancer Mother 43       2 types of cancer one side mastectomy with chemo   Endometriosis Mother    Endometriosis  Maternal Grandmother    Breast cancer Maternal Grandmother        post menopausal   Diabetes Maternal Grandmother    Cancer Maternal Grandfather        prostate and lymphoma   Alzheimer's disease Paternal Grandmother    Cancer Father 57       prostate cancer   Aneurysm Paternal Grandfather        d. 30s     SOCIAL HISTORY:  reports that she has been smoking cigarettes. She has a 2.5 pack-year smoking history. She has never used smokeless tobacco. She reports current alcohol use of about 7.0 - 14.0 standard drinks of alcohol per week. She reports that she does not currently use drugs after having used the following drugs: Marijuana. The patient is married and lives in [place].  She has young children and is accompanied by her mother in law.     ALLERGIES: Patient has no known allergies.   MEDICATIONS:  Current Outpatient Medications  Medication Sig Dispense Refill   acetaminophen  (TYLENOL ) 325 MG tablet  Take 2 tablets (650 mg total) by mouth every 4 (four) hours as needed (for pain scale < 4). 100 tablet 0   capecitabine  (XELODA ) 500 MG tablet Take 2 tablets (1,000 mg total) by mouth 2 (two) times daily after a meal. 14 days on and 7 days off 56 tablet 3   diazePAM , 20 MG Dose, 2 x 10 MG/0.1ML LQPK Place 20 mg into the nose once as needed for up to 1 dose. Use for seizure lasting greater than 2 minutes 2 each 0   levETIRAcetam  (KEPPRA ) 1000 MG tablet Take 1 tablet (1,000 mg total) by mouth 2 (two) times daily. 60 tablet 3   levonorgestrel (MIRENA) 20 MCG/DAY IUD 1 each by Intrauterine route once.     levothyroxine  (SYNTHROID ) 175 MCG tablet Take 175 mcg by mouth daily before breakfast.     LORazepam  (ATIVAN ) 0.5 MG tablet Take 1 tablet (0.5 mg total) by mouth every 12 (twelve) hours as needed for anxiety. 30 tablet 0   NERLYNX  40 MG tablet START BY TAKING 3 TABLETS BY MOUTH DAILY FOR 2 WEEKS, THEN INCREASE TO 4 TABLETS DAILY FOR 2 WEEKS, THEN 5 TABLETS DAILY FOR 2 WEEKS, AND THEN 6  TABLETS BY MOUTH DAILY THEREAFTER. 180 tablet 0   ondansetron  (ZOFRAN ) 8 MG tablet Take 1 tablet (8 mg total) by mouth every 8 (eight) hours as needed for nausea or vomiting. 20 tablet 3   sertraline  (ZOLOFT ) 50 MG tablet Take 50 mg by mouth at bedtime.     VYVANSE  60 MG capsule Take 60 mg by mouth every morning.     No current facility-administered medications for this encounter.   Facility-Administered Medications Ordered in Other Encounters  Medication Dose Route Frequency Provider Last Rate Last Admin   sodium chloride  flush (NS) 0.9 % injection 10 mL  10 mL Intravenous PRN Magrinat, Gustav C, MD         REVIEW OF SYSTEMS: On review of systems, the patient reports that she is doing well overall. She's not had any seizure activity and is not having any current nausea, headaches, or difficulty with speech. She realized after her visit with Dr. Buckley that she was supposed to be taking Keppra  BID, but has only been taking it once an evening for months. No other complaints are verbalized.      PHYSICAL EXAM:  Wt Readings from Last 3 Encounters:  12/07/23 176 lb 12.8 oz (80.2 kg)  12/05/23 180 lb (81.6 kg)  11/08/23 182 lb 8.7 oz (82.8 kg)   Temp Readings from Last 3 Encounters:  12/07/23 97.7 F (36.5 C)  12/05/23 (!) 97.4 F (36.3 C) (Temporal)  11/08/23 98.3 F (36.8 C) (Oral)   BP Readings from Last 3 Encounters:  12/07/23 115/89  12/05/23 125/71  11/09/23 122/86   Pulse Readings from Last 3 Encounters:  12/07/23 72  12/05/23 73  11/09/23 80   Pain Assessment Pain Score: 0-No pain/10  In general this is a well appearing caucasian female in no acute distress. She's alert and oriented x4 and appropriate throughout the examination. Cardiopulmonary assessment is negative for acute distress and she exhibits normal effort.     ECOG = 0  0 - Asymptomatic (Fully active, able to carry on all predisease activities without restriction)  1 - Symptomatic but completely  ambulatory (Restricted in physically strenuous activity but ambulatory and able to carry out work of a light or sedentary nature. For example, light housework, office work)  2 - Symptomatic, <50% in  bed during the day (Ambulatory and capable of all self care but unable to carry out any work activities. Up and about more than 50% of waking hours)  3 - Symptomatic, >50% in bed, but not bedbound (Capable of only limited self-care, confined to bed or chair 50% or more of waking hours)  4 - Bedbound (Completely disabled. Cannot carry on any self-care. Totally confined to bed or chair)  5 - Death   Raylene MM, Creech RH, Tormey DC, et al. [phone]). Toxicity and response criteria of the [facility]. Am. DOROTHA Bridges. Oncol. 5 (6): 649-55       IMPRESSION/PLAN: 1.  Recurrent metastatic stage IIIA, [id number] grade 1, triple positive invasive ductal carcinoma of the right breast involving the brain. We discussed the patient's recent imaging to date and that she has three newly found brain metastases that likely were below delectability on her MRI this June. She is tolerating her systemic therapy and we will proceed with stereotactic radiosurgery [facility]) to the three new lesions in a single fraction. She is aware of the need for IV contrasted CT simulation which is scheduled on Tuesday and treatment on Thursday next week. We reviewed risks, benefits, short, and long term effects of radiotherapy.  Written consent is obtained and placed in the chart, a copy was provided to the patient. She will get labs drawn today in preparation of next week's simulation.  2. 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 I will reach out to Dr. Buckley to make sure he's aware of the patient not having taking BID dosing to see how he wants her to proceed.   In a visit lasting 45 minutes, greater than 50% of the time was spent face to face discussing the patient's condition, in preparation for the discussion, and coordinating the  patient's care.       Donald KYM Husband, [facility]   **Disclaimer: This note was dictated with voice recognition software. Similar sounding words can inadvertently be transcribed and this note may contain transcription errors which may not have been corrected upon publication of note.**

## 2023-12-08 LAB — THYROID PANEL WITH TSH
Free Thyroxine Index: 2.3 (ref 1.2–4.9)
T3 Uptake Ratio: 30 % (ref 24–39)
T4, Total: 7.8 ug/dL (ref 4.5–12.0)
TSH: 2.81 u[IU]/mL (ref 0.450–4.500)

## 2023-12-08 LAB — CA 27.29 (SERIAL MONITOR): CA 27.29: 14.6 U/mL (ref 0.0–38.6)

## 2023-12-13 ENCOUNTER — Ambulatory Visit
Admission: RE | Admit: 2023-12-13 | Discharge: 2023-12-13 | Disposition: A | Discharge status: Home / Self Care | Source: Ambulatory Visit | Attending: Radiation Oncology | Admitting: Radiation Oncology

## 2023-12-13 DIAGNOSIS — Z510 Encounter for antineoplastic radiation therapy: Secondary | ICD-10-CM | POA: Diagnosis not present

## 2023-12-13 DIAGNOSIS — C7931 Secondary malignant neoplasm of brain: Secondary | ICD-10-CM

## 2023-12-13 NOTE — Progress Notes (Signed)
 Has armband been applied?  Yes.    Does patient have an allergy to IV contrast dye?: No   Has patient ever received premedication for IV contrast dye?:  No  Does patient take metformin?: No.  If patient does take metformin when was the last dose: No  Date of lab work: December 07, 2023 BUN: 8 CR: 0.89 eGfr: >60  IV site: forearm left, condition no redness  Has IV site been added to flowsheet?  Yes.    There were no vitals taken for this visit.

## 2023-12-15 ENCOUNTER — Ambulatory Visit: Admission: RE | Admit: 2023-12-15 | Source: Ambulatory Visit | Admitting: Radiation Oncology

## 2023-12-16 ENCOUNTER — Other Ambulatory Visit: Payer: Self-pay | Admitting: Hematology and Oncology

## 2023-12-19 ENCOUNTER — Other Ambulatory Visit: Payer: Self-pay

## 2023-12-19 DIAGNOSIS — Z170 Estrogen receptor positive status [ER+]: Secondary | ICD-10-CM

## 2023-12-20 ENCOUNTER — Encounter: Payer: Self-pay | Admitting: Hematology and Oncology

## 2023-12-20 ENCOUNTER — Telehealth: Payer: Self-pay | Admitting: Radiation Therapy

## 2023-12-20 ENCOUNTER — Inpatient Hospital Stay

## 2023-12-20 ENCOUNTER — Inpatient Hospital Stay: Attending: Radiation Oncology | Admitting: Hematology and Oncology

## 2023-12-20 ENCOUNTER — Telehealth: Payer: Self-pay

## 2023-12-20 VITALS — BP 118/78 | HR 81 | Temp 98.4°F | Resp 18 | Wt 179.0 lb

## 2023-12-20 DIAGNOSIS — Z170 Estrogen receptor positive status [ER+]: Secondary | ICD-10-CM | POA: Insufficient documentation

## 2023-12-20 DIAGNOSIS — Z510 Encounter for antineoplastic radiation therapy: Secondary | ICD-10-CM | POA: Diagnosis not present

## 2023-12-20 DIAGNOSIS — Z79899 Other long term (current) drug therapy: Secondary | ICD-10-CM | POA: Insufficient documentation

## 2023-12-20 DIAGNOSIS — C50211 Malignant neoplasm of upper-inner quadrant of right female breast: Secondary | ICD-10-CM | POA: Insufficient documentation

## 2023-12-20 DIAGNOSIS — Z9013 Acquired absence of bilateral breasts and nipples: Secondary | ICD-10-CM | POA: Diagnosis not present

## 2023-12-20 NOTE — Assessment & Plan Note (Signed)
 Stage IIIa triple positive breast cancer.   Treatment history: 1.  Initial diagnosis with T2N3 triple positive breast cancer PET scan showed regional adenopathy with no distant metastasis. 2.  Neoadjuvant chemotherapy with TCHP x6 from 07/23/2020 through 11/06/2020 3.  2 doses of Herceptin  on 11/27/2020 and 12/18/2020 4.  Bilateral mastectomies on January 07, 2021 which showed T2 N1 MI residual cancer. 5.  Patient began adjuvant Kadcyla  x14 cycles beginning January 26, 2021 through 10/14/2021 6.  Adjuvant radiation completed January 25 7.  Followed by adjuvant antiestrogen therapy with Zoladex  and anastrozole  8. Hospitalization: 08/11/23: Seizure: Brain Mets S/P SRS, CT CAP 08/12/23: No mets (inflamed colon) ______________________________________________________________________ Current treatment: Xeloda  with neratinib  started 09/27/2023 Toxicities: Intermittent diarrhea well-controlled with Imodium : I encouraged her to stay on neratinib  4 tablets a day. Mild nausea: Sent a prescription for Zofran  Dryness of the hands secondary to Xeloda   PET CT 09/28/23: Complete resolution of met activity T 12 bone met Labs and follow-up in 3 months

## 2023-12-20 NOTE — Telephone Encounter (Signed)
 LVM for pt to call back regarding Keppra  dose. Pt should be taking 1000mg  twice daily.

## 2023-12-20 NOTE — Progress Notes (Signed)
 Patient Care Team: [facility] [place] as PCP - General (Family Medicine) Dewey Rush, MD as Consulting Physician (Radiation Oncology) Eletha Boas, MD as Consulting Physician (General Surgery) Odean Potts, MD as Consulting Physician (Hematology and Oncology) Curvin Deward MOULD, MD as Consulting Physician (General Surgery) Faythe Purchase, MD as Consulting Physician (Endocrinology) Gorge Ade, MD as Consulting Physician (Obstetrics and Gynecology)  DIAGNOSIS:  Encounter Diagnosis  Name Primary?   Malignant neoplasm of upper-inner quadrant of right breast in female, estrogen receptor positive (HCC) Yes    SUMMARY OF ONCOLOGIC HISTORY: Oncology History  Malignant neoplasm of upper-inner quadrant of right breast in female, estrogen receptor positive (HCC)  07/08/2020 Initial Diagnosis   a clinical mT2 N3, stage IIIA invasive ductal carcinoma, grade 1, triple positive  (a) breast MRI 07/06/2020 finds a 1.6 cm lower inner quadrant mass associated with 5.4 cm of non-mass-like enhancement, at least 8 enlarged right axillary lymph node and 1 right internal mammary lymph node  (b) CT scans of the neck and chest 06/24/2020 shows subcentimeter right cervical adenopathy, 2 right supraclavicular lymph nodes the larger 0.65 cm, also left cervical adenopathy largest measuring 1.02 cm; there is also a 1.2 cm lesion in the left hepatic lobe, and a sclerotic lesion in the mid thoracic spine at T7  (c) PET scan 07/21/2020 shows regional adenopathy (including 2 right subcentimeter supraclavicular lymph nodes and subcentimeter lymph nodes in the right hilar, subcarinal and lateral aortic region), no uptake in liver, and an indeterminate T12 lesion  (d) thoracic spine MRI 07/27/2020 shows the T12 lesion to be likely benign, there is also degenerative disc disease   07/23/2020 - 12/18/2020 Neo-Adjuvant Chemotherapy   neoadjuvant chemotherapy will consist of carboplatin , docetaxel ,  trastuzumab , and pertuzumab  q. 21 days x 6 07/23/2020-11/06/2020 --two doses of Trastuzumab  given on 9/22 and 10/13   01/07/2021 Cancer Staging   Staging form: Breast, AJCC 8th Edition - Pathologic stage from 01/07/2021: No Stage Recommended (ypT2, pN85mi, cM0, G2) - Signed by Crawford Morna Pickle, NP on 01/15/2021 Stage prefix: Post-therapy Histologic grading system: 3 grade system   01/07/2021 Surgery   She underwent bilateral mastectomy on 01/07/2021 that showed: left breast no evidence of malignancy, and the right breast invasive ductal carcinoma grade 2, spanning fibrotic area of approximately 5 cm in patches.  Margins were negative.   (A) Microscopic metastatic carcinoma was noted in 1 out of 15 lymph nodes biopsied.   01/26/2021 - 10/14/2021 Chemotherapy   Patient is on Treatment Plan : BREAST ADO-Trastuzumab Emtansine  (Kadcyla ) q21d     02/13/2021 - 04/01/2021 Radiation Therapy   Site Technique Total Dose (Gy) Dose per Fx (Gy) Completed Fx Beam Energies  Chest Wall, Right: CW_R 3D 50.4/50.4 1.8 28/28 6XFFF, 10X  Chest Wall, Right: CW_R_SCLV 3D 50.4/50.4 1.8 28/28 6X, 10X  Chest Wall, Right: CW_R_Bst Electron 10/10 2 5/5 6E     04/08/2021 -  Anti-estrogen oral therapy   Antiestrogen therapy with Zolodex and AI starting 04/21/21     CHIEF COMPLIANT: Follow-up on Xeloda  and neratinib   HISTORY OF PRESENT ILLNESS:   History of Present Illness Kayla Little is a 38 year old female with metastatic breast cancer who presents for follow-up regarding her current treatment regimen.  She is currently on Capecitabine  (Xeloda ) and Neratinib  (Nerlynx ) for metastatic breast cancer. Diarrhea occurs, especially when starting Capecitabine , and is managed with Imodium  and dietary changes. Stools are typically loose in the morning with one or two additional bowel movements, but  it remains manageable. No issues with medication access or significant health changes aside from diarrhea.  She has  scheduled Stereotactic Radiosurgery [facility]) sessions on October 16th, 20th, and 22nd, adjusted due to proximity to a previous treatment site.  Recent blood work from October 1st shows normal electrolytes, thyroid  function, B12 levels, and hemoglobin. She takes B12 supplements, which she finds beneficial.  A PET scan was performed on July 23rd, with another planned in three months. Her next MRI is scheduled for January.     ALLERGIES:  has no known allergies.  MEDICATIONS:  Current Outpatient Medications  Medication Sig Dispense Refill   acetaminophen  (TYLENOL ) 325 MG tablet Take 2 tablets (650 mg total) by mouth every 4 (four) hours as needed (for pain scale < 4). 100 tablet 0   capecitabine  (XELODA ) 500 MG tablet Take 2 tablets (1,000 mg total) by mouth 2 (two) times daily after a meal. 14 days on and 7 days off 56 tablet 3   diazePAM , 20 MG Dose, 2 x 10 MG/0.1ML LQPK Place 20 mg into the nose once as needed for up to 1 dose. Use for seizure lasting greater than 2 minutes 2 each 0   levETIRAcetam  (KEPPRA ) 1000 MG tablet Take 1 tablet (1,000 mg total) by mouth 2 (two) times daily. 60 tablet 3   levonorgestrel (MIRENA) 20 MCG/DAY IUD 1 each by Intrauterine route once.     levothyroxine  (SYNTHROID ) 175 MCG tablet Take 175 mcg by mouth daily before breakfast.     LORazepam  (ATIVAN ) 0.5 MG tablet Take 1 tablet (0.5 mg total) by mouth every 12 (twelve) hours as needed for anxiety. 30 tablet 0   NERLYNX  40 MG tablet TAKE 6 TABLETS BY MOUTH 1 TIME A DAY 180 tablet 0   ondansetron  (ZOFRAN ) 8 MG tablet Take 1 tablet (8 mg total) by mouth every 8 (eight) hours as needed for nausea or vomiting. 20 tablet 3   sertraline  (ZOLOFT ) 50 MG tablet Take 50 mg by mouth at bedtime.     VYVANSE  60 MG capsule Take 60 mg by mouth every morning.     No current facility-administered medications for this visit.   Facility-Administered Medications Ordered in Other Visits  Medication Dose Route Frequency Provider Last  Rate Last Admin   sodium chloride  flush (NS) 0.9 % injection 10 mL  10 mL Intravenous PRN Magrinat, Sandria BROCKS, MD        PHYSICAL EXAMINATION: ECOG PERFORMANCE STATUS: 1 - Symptomatic but completely ambulatory  Vitals:   12/20/23 1122  BP: 118/78  Pulse: 81  Resp: 18  Temp: 98.4 F (36.9 C)  SpO2: 100%   Filed Weights   12/20/23 1122  Weight: 179 lb (81.2 kg)      LABORATORY DATA:  I have reviewed the data as listed    Latest Ref Rng & Units 12/07/2023    9:48 AM 10/20/2023   12:05 PM 08/11/2023   11:09 AM  CMP  Glucose 70 - 99 mg/dL 86  94  894   BUN 6 - 20 mg/dL 8  9  10    Creatinine 0.44 - 1.00 mg/dL 9.10  9.20  9.09   Sodium 135 - 145 mmol/L 137  139  136   Potassium 3.5 - 5.1 mmol/L 4.3  4.0  4.3   Chloride 98 - 111 mmol/L 105  103  103   CO2 22 - 32 mmol/L 24  28    Calcium  8.9 - 10.3 mg/dL 8.1  8.3  Total Protein 6.5 - 8.1 g/dL 7.3  7.2    Total Bilirubin 0.0 - 1.2 mg/dL 0.7  0.4    Alkaline Phos 38 - 126 U/L 46  47    AST 15 - 41 U/L 29  19    ALT 0 - 44 U/L 33  17      Lab Results  Component Value Date   WBC 7.4 12/07/2023   HGB 14.4 12/07/2023   HCT 41.9 12/07/2023   MCV 99.5 12/07/2023   PLT 329 12/07/2023   NEUTROABS 4.9 12/07/2023    ASSESSMENT & PLAN:  Malignant neoplasm of upper-inner quadrant of right breast in female, estrogen receptor positive (HCC) Stage IIIa triple positive breast cancer.   Treatment history: 1.  Initial diagnosis with T2N3 triple positive breast cancer PET scan showed regional adenopathy with no distant metastasis. 2.  Neoadjuvant chemotherapy with TCHP x6 from 07/23/2020 through 11/06/2020 3.  2 doses of Herceptin  on 11/27/2020 and 12/18/2020 4.  Bilateral mastectomies on January 07, 2021 which showed T2 N1 MI residual cancer. 5.  Patient began adjuvant Kadcyla  x14 cycles beginning January 26, 2021 through 10/14/2021 6.  Adjuvant radiation completed January 25 7.  Followed by adjuvant antiestrogen therapy with Zoladex   and anastrozole  8. Hospitalization: 08/11/23: Seizure: Brain Mets S/P SRS, CT CAP 08/12/23: No mets (inflamed colon) ______________________________________________________________________ Current treatment: Xeloda  with neratinib  started 09/27/2023 Toxicities: Intermittent diarrhea well-controlled with Imodium : I encouraged her to stay on neratinib  4 tablets a day. Mild nausea: Improved with Zofran  Dryness of the hands secondary to Xeloda   PET CT 09/28/23: Complete resolution of met activity T 12 bone met New brain metastasis: SRS to the brain planned Currently on Keppra .  She is only taking 1 tablet a day.  I sent a message to Dr. Buckley if you would like to increase the dosage of Keppra . Labs and follow-up in 3 months    ------------------------------------- Assessment and Plan Assessment & Plan Metastatic ER-positive breast cancer Undergoing Xeloda  and Nerlynx  with scheduled SRS treatments. Blood work normal. PET scan planned in three months, MRI in January. - Continue Xeloda  and Nerlynx . - Proceed with SRS treatments on October 16th, 20th, and 22nd. - Schedule PET scan in three months. - Perform MRI in January.  Chemotherapy-induced diarrhea Diarrhea managed with Imodium  and dietary adjustments. Electrolytes stable. - Continue Imodium  as needed. - Monitor dietary intake.  Seizure management Taking Keppra  1000 mg daily, consulting neurology for dosage adjustment due to upcoming radiation therapy. - Consult neurology regarding Keppra  dosage. - Adjust Keppra  dosage as advised.      No orders of the defined types were placed in this encounter.  The patient has a good understanding of the overall plan. she agrees with it. she will call with any problems that may develop before the next visit here.  I personally spent a total of 30 minutes in the care of the patient today including preparing to see the patient, getting/reviewing separately obtained history, performing a medically  appropriate exam/evaluation, counseling and educating, placing orders, referring and communicating with other health care professionals, documenting clinical information in the EHR, independently interpreting results, communicating results, and coordinating care.   Viinay K Alexxus Sobh, MD 12/20/23

## 2023-12-20 NOTE — Telephone Encounter (Signed)
 I left a detailed message about the rescheduled treatment times for Kayla Little. My contact information was included in case she has questions or concerns.   Devere Perch R.T(R)(T) Radiation Special Procedures Lead

## 2023-12-20 NOTE — Telephone Encounter (Signed)
 Spoke to pt about Keppra  dosage, confirmed with pt that she should be taking 1000mg  twice daily. Pt aware and agrees.

## 2023-12-22 ENCOUNTER — Ambulatory Visit
Admission: RE | Admit: 2023-12-22 | Discharge: 2023-12-22 | Disposition: A | Discharge status: Home / Self Care | Source: Ambulatory Visit | Attending: Radiation Oncology | Admitting: Radiation Oncology

## 2023-12-22 ENCOUNTER — Other Ambulatory Visit: Payer: Self-pay | Admitting: Radiation Therapy

## 2023-12-22 ENCOUNTER — Other Ambulatory Visit: Payer: Self-pay

## 2023-12-22 DIAGNOSIS — Z510 Encounter for antineoplastic radiation therapy: Secondary | ICD-10-CM | POA: Diagnosis not present

## 2023-12-22 DIAGNOSIS — C7931 Secondary malignant neoplasm of brain: Secondary | ICD-10-CM

## 2023-12-22 LAB — RAD ONC ARIA SESSION SUMMARY
Course Elapsed Days: 0
Plan Fractions Treated to Date: 1
Plan Prescribed Dose Per Fraction: 8 Gy
Plan Total Fractions Prescribed: 1
Plan Total Prescribed Dose: 8 Gy
Reference Point Dosage Given to Date: 8 Gy
Reference Point Session Dosage Given: 8 Gy
Session Number: 1

## 2023-12-22 NOTE — Progress Notes (Signed)
 Mrs. Deeley rested with us  for 15 minutes following her SRS treatment.  Patient denies headache, dizziness, nausea, diplopia or ringing in the ears. Denies fatigue. Patient without complaints. Understands to avoid strenuous activity for the next 24 hours and call [phone] with needs.   BP 117/84 (BP Location: Left Arm, Patient Position: Sitting, Cuff Size: Large)   Pulse 73   Temp 97.8 F (36.6 C)   Resp 18   Ht 5' 6.5 (1.689 m)   SpO2 100%   BMI 28.46 kg/m

## 2023-12-26 ENCOUNTER — Ambulatory Visit
Admission: RE | Admit: 2023-12-26 | Discharge: 2023-12-26 | Disposition: A | Source: Ambulatory Visit | Attending: Radiation Oncology | Admitting: Radiation Oncology

## 2023-12-26 ENCOUNTER — Other Ambulatory Visit: Payer: Self-pay

## 2023-12-26 DIAGNOSIS — Z510 Encounter for antineoplastic radiation therapy: Secondary | ICD-10-CM | POA: Diagnosis not present

## 2023-12-26 LAB — RAD ONC ARIA SESSION SUMMARY
Course Elapsed Days: 4
Plan Fractions Treated to Date: 1
Plan Prescribed Dose Per Fraction: 8 Gy
Plan Total Fractions Prescribed: 2
Plan Total Prescribed Dose: 16 Gy
Reference Point Dosage Given to Date: 8 Gy
Reference Point Session Dosage Given: 8 Gy
Session Number: 2

## 2023-12-26 NOTE — Progress Notes (Signed)
 Kayla Little rested with us  for 15 minutes following her SRS treatment.  Patient denies headache, dizziness, nausea, diplopia or ringing in the ears. Denies fatigue. Patient without complaints. Understands to avoid strenuous activity for the next 24 hours and call [phone] with needs.   BP 112/78 (BP Location: Left Arm)   Pulse 72   Temp (!) 96 F (35.6 C) (Temporal)   Resp 18   SpO2 100%

## 2023-12-28 ENCOUNTER — Ambulatory Visit
Admission: RE | Admit: 2023-12-28 | Discharge: 2023-12-28 | Disposition: A | Discharge status: Home / Self Care | Source: Ambulatory Visit | Attending: Radiation Oncology | Admitting: Radiation Oncology

## 2023-12-28 ENCOUNTER — Other Ambulatory Visit: Payer: Self-pay

## 2023-12-28 DIAGNOSIS — Z510 Encounter for antineoplastic radiation therapy: Secondary | ICD-10-CM | POA: Diagnosis not present

## 2023-12-28 LAB — RAD ONC ARIA SESSION SUMMARY
Course Elapsed Days: 6
Plan Fractions Treated to Date: 2
Plan Prescribed Dose Per Fraction: 8 Gy
Plan Total Fractions Prescribed: 2
Plan Total Prescribed Dose: 16 Gy
Reference Point Dosage Given to Date: 16 Gy
Reference Point Session Dosage Given: 8 Gy
Session Number: 3

## 2023-12-28 NOTE — Progress Notes (Signed)
 Kayla Little rested with us  for 15 minutes following her SRS treatment.  Patient denies headache, dizziness, nausea, diplopia or ringing in the ears. Denies fatigue. Patient without complaints. Understands to avoid strenuous activity for the next 24 hours and call [phone] with needs.  Pt aware she would receive a follow-up phone call from our office in about a month.    BP 114/77 (BP Location: Left Arm, Patient Position: Sitting, Cuff Size: Large)   Pulse 70   Temp 97.7 F (36.5 C)   Resp 18   Ht 5' 6.5 (1.689 m)   SpO2 100%   BMI 28.46 kg/m

## 2023-12-31 NOTE — Radiation Completion Notes (Signed)
  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   Intent: Palliative     ==========DELIVERED PLANS==========  First Treatment Date: 2023-12-22 Last Treatment Date: 2023-12-28   Plan Name: Brain_SRT_fx1 Site: Brain Technique: SBRT/SRT-IMRT Mode: Photon PTV_8_L_Parietal_26mm PTV_7_L_TempOccip_52mm Dose Per Fraction: 20Gy Prescribed Dose (Delivered / Prescribed): 20 Gy / 20 Gy Prescribed Fxs (Delivered / Prescribed): 1 / 1   Plan Name: Brain_SRTf1-3 Site: Brain PTV_9_R_Occipital_49mm Technique: SBRT/SRT-IMRT Mode: Photon Dose Per Fraction: 8 Gy Prescribed Dose (Delivered / Prescribed): 24 Gy / 24 Gy Prescribed Fxs (Delivered / Prescribed): 3/3     ==========ON TREATMENT VISIT DATES========== 2023-12-22, 2023-12-26, 2023-12-28    See weekly On Treatment Notes in Epic for details in the Media tab (listed as Progress notes on the On Treatment Visit Dates listed above). The patient tolerated radiation. She developed fatigue.  The patient will receive a call in about one month from the radiation oncology department. She will continue follow up with Dr. Gudena as well as in the brain oncology program.     Donald KYM Husband, [facility]

## 2024-01-16 ENCOUNTER — Encounter

## 2024-01-23 NOTE — Progress Notes (Signed)
  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    First Treatment Date: 2023-12-22 Last Treatment Date: 2023-12-28   Plan Name: Brain_SRT_fx1 Site: Brain Technique: SBRT/SRT-IMRT Mode: Photon PTV_8_L_Parietal_36mm PTV_7_L_TempOccip_20mm Dose Per Fraction: 20Gy Prescribed Dose (Delivered / Prescribed): 20 Gy / 20 Gy Prescribed Fxs (Delivered / Prescribed): 1 / 1   Plan Name: Brain_SRTf1-3 Site: Brain PTV_9_R_Occipital_21mm Technique: SBRT/SRT-IMRT Mode: Photon Dose Per Fraction: 8 Gy Prescribed Dose (Delivered / Prescribed): 24 Gy / 24 Gy Prescribed Fxs (Delivered / Prescribed): 3/3  The patient {WAS/WAS NOT:[phone]::was not} available for call today.  The patient {Desc; did/not:[id number]} note fatigue during radiation. The patient {Desc; did/not:[id number]} note hair loss or skin changes in the field of radiation during therapy. The patient {ACTION; IS/IS WNU:[id number]} taking dexamethasone . The patient {DOES_DOES WNU:81435} have symptoms of  weakness or loss of control of the extremities. The patient {DOES_DOES WNU:81435} have symptoms of headache. The patient {DOES_DOES WNU:81435} have symptoms of seizure or uncontrolled movement. The patient {DOES_DOES WNU:81435} have symptoms of changes in vision. The patient {DOES_DOES WNU:81435} have changes in speech. The patient {DOES_DOES WNU:81435} have confusion.   The patient was counseled that she will be contacted by our brain and spine navigator to schedule surveillance imaging. The patient was encouraged to call if  she have not received a call to schedule imaging, or if she develop concerns or questions regarding radiation. The patient will also  continue to follow up with Dr. Odean in medical oncology.

## 2024-01-30 ENCOUNTER — Ambulatory Visit
Admission: RE | Admit: 2024-01-30 | Discharge: 2024-01-30 | Disposition: A | Discharge status: Home / Self Care | Source: Ambulatory Visit | Attending: Radiation Oncology | Admitting: Radiation Oncology

## 2024-01-30 DIAGNOSIS — C7931 Secondary malignant neoplasm of brain: Secondary | ICD-10-CM

## 2024-01-30 DIAGNOSIS — C50211 Malignant neoplasm of upper-inner quadrant of right female breast: Secondary | ICD-10-CM

## 2024-02-09 ENCOUNTER — Other Ambulatory Visit (HOSPITAL_COMMUNITY): Payer: Self-pay

## 2024-03-16 ENCOUNTER — Telehealth: Payer: Self-pay | Admitting: Internal Medicine

## 2024-03-16 NOTE — Telephone Encounter (Signed)
 Rescheduled patients appt on 1/12 to 1/29 so the scan results can come back in time. Called and left a voicemail with the rescheduled date and time.

## 2024-03-19 ENCOUNTER — Inpatient Hospital Stay: Admitting: Internal Medicine

## 2024-03-22 ENCOUNTER — Ambulatory Visit (HOSPITAL_COMMUNITY)
Admission: RE | Admit: 2024-03-22 | Discharge: 2024-03-22 | Disposition: A | Discharge status: Home / Self Care | Source: Ambulatory Visit | Attending: Hematology and Oncology | Admitting: Hematology and Oncology

## 2024-03-22 DIAGNOSIS — Z170 Estrogen receptor positive status [ER+]: Secondary | ICD-10-CM | POA: Insufficient documentation

## 2024-03-22 DIAGNOSIS — C50211 Malignant neoplasm of upper-inner quadrant of right female breast: Secondary | ICD-10-CM | POA: Insufficient documentation

## 2024-03-22 LAB — GLUCOSE, CAPILLARY: Glucose-Capillary: 107 mg/dL — ABNORMAL HIGH (ref 70–99)

## 2024-03-22 MED ORDER — FLUDEOXYGLUCOSE F - 18 (FDG) INJECTION
8.9000 | Freq: Once | INTRAVENOUS | Status: AC
  Administered 2024-03-22: 8.9 via INTRAVENOUS

## 2024-03-23 NOTE — Progress Notes (Incomplete)
 Follow up call to discuss results of the brain MRI from 03/27/24.  Nursing interview completed.  IMPRESSION: 1. New 5 mm metastasis superiorly in the right cerebellar hemisphere. 2. New 14 mm focus of irregular enhancement in the left temporooccipital periventricular white matter with mild edema. This is adjacent to an intervally treated lesion and could reflect a new metastasis or post-treatment effect. 3. Essentially resolved enhancement associated with intervally treated left temporooccipital, left parietal, and right occipital metastases.

## 2024-03-27 ENCOUNTER — Ambulatory Visit
Admission: RE | Admit: 2024-03-27 | Discharge: 2024-03-27 | Disposition: A | Source: Ambulatory Visit | Attending: Radiation Oncology | Admitting: Radiation Oncology

## 2024-03-27 DIAGNOSIS — C7931 Secondary malignant neoplasm of brain: Secondary | ICD-10-CM

## 2024-03-27 MED ORDER — GADOPICLENOL 0.5 MMOL/ML IV SOLN
8.0000 mL | Freq: Once | INTRAVENOUS | Status: AC | PRN
  Administered 2024-03-27: 8 mL via INTRAVENOUS

## 2024-03-27 NOTE — Assessment & Plan Note (Signed)
 Stage IIIa triple positive breast cancer.   Treatment history: 1.  Initial diagnosis with T2N3 triple positive breast cancer PET scan showed regional adenopathy with no distant metastasis. 2.  Neoadjuvant chemotherapy with TCHP x6 from 07/23/2020 through 11/06/2020 3.  2 doses of Herceptin  on 11/27/2020 and 12/18/2020 4.  Bilateral mastectomies on January 07, 2021 which showed T2 N1 MI residual cancer. 5.  Patient began adjuvant Kadcyla  x14 cycles beginning January 26, 2021 through 10/14/2021 6.  Adjuvant radiation completed January 25 7.  Followed by adjuvant antiestrogen therapy with Zoladex  and anastrozole  8. Hospitalization: 08/11/23: Seizure: Brain Mets S/P SRS, CT CAP 08/12/23: No mets (inflamed colon) ______________________________________________________________________ Current treatment: Xeloda  with neratinib  started 09/27/2023 Toxicities: Intermittent diarrhea well-controlled with Imodium : I encouraged her to stay on neratinib  4 tablets a day. Mild nausea: Improved with Zofran  Dryness of the hands secondary to Xeloda    PET CT 09/28/23: Complete resolution of met activity T 12 bone met New brain metastasis: SRS to the brain planned Currently on Keppra .  She is only taking 1 tablet a day.  I sent a message to Dr. Buckley if you would like to increase the dosage of Keppra .  Labs and follow-up in 3 months

## 2024-03-28 ENCOUNTER — Inpatient Hospital Stay: Attending: Radiation Oncology | Admitting: Hematology and Oncology

## 2024-03-28 ENCOUNTER — Encounter: Payer: Self-pay | Admitting: Hematology and Oncology

## 2024-03-28 VITALS — BP 107/70 | HR 86 | Temp 98.0°F | Resp 16 | Wt 182.1 lb

## 2024-03-28 DIAGNOSIS — Z170 Estrogen receptor positive status [ER+]: Secondary | ICD-10-CM | POA: Insufficient documentation

## 2024-03-28 DIAGNOSIS — C7931 Secondary malignant neoplasm of brain: Secondary | ICD-10-CM | POA: Diagnosis not present

## 2024-03-28 DIAGNOSIS — C50211 Malignant neoplasm of upper-inner quadrant of right female breast: Secondary | ICD-10-CM | POA: Diagnosis not present

## 2024-03-28 DIAGNOSIS — Z9221 Personal history of antineoplastic chemotherapy: Secondary | ICD-10-CM | POA: Diagnosis not present

## 2024-03-28 DIAGNOSIS — C7951 Secondary malignant neoplasm of bone: Secondary | ICD-10-CM | POA: Diagnosis not present

## 2024-03-28 DIAGNOSIS — Z923 Personal history of irradiation: Secondary | ICD-10-CM | POA: Insufficient documentation

## 2024-03-28 MED ORDER — TAMOXIFEN CITRATE 20 MG PO TABS: 20.0000 mg | ORAL_TABLET | Freq: Every day | ORAL | 3 refills | Status: AC

## 2024-03-28 NOTE — Progress Notes (Signed)
 "  Patient Care Team: [facility] [place] as PCP - General (Family Medicine) Dewey Rush, MD as Consulting Physician (Radiation Oncology) Eletha Boas, MD as Consulting Physician (General Surgery) Odean Potts, MD as Consulting Physician (Hematology and Oncology) Curvin Deward MOULD, MD as Consulting Physician (General Surgery) Faythe Purchase, MD as Consulting Physician (Endocrinology) Gorge Ade, MD (Inactive) as Consulting Physician (Obstetrics and Gynecology)  DIAGNOSIS:  Encounter Diagnosis  Name Primary?   Malignant neoplasm of upper-inner quadrant of right breast in female, estrogen receptor positive (HCC) Yes    SUMMARY OF ONCOLOGIC HISTORY: Oncology History  Malignant neoplasm of upper-inner quadrant of right breast in female, estrogen receptor positive (HCC)  07/08/2020 Initial Diagnosis   a clinical mT2 N3, stage IIIA invasive ductal carcinoma, grade 1, triple positive  (a) breast MRI 07/06/2020 finds a 1.6 cm lower inner quadrant mass associated with 5.4 cm of non-mass-like enhancement, at least 8 enlarged right axillary lymph node and 1 right internal mammary lymph node  (b) CT scans of the neck and chest 06/24/2020 shows subcentimeter right cervical adenopathy, 2 right supraclavicular lymph nodes the larger 0.65 cm, also left cervical adenopathy largest measuring 1.02 cm; there is also a 1.2 cm lesion in the left hepatic lobe, and a sclerotic lesion in the mid thoracic spine at T7  (c) PET scan 07/21/2020 shows regional adenopathy (including 2 right subcentimeter supraclavicular lymph nodes and subcentimeter lymph nodes in the right hilar, subcarinal and lateral aortic region), no uptake in liver, and an indeterminate T12 lesion  (d) thoracic spine MRI 07/27/2020 shows the T12 lesion to be likely benign, there is also degenerative disc disease   07/23/2020 - 12/18/2020 Neo-Adjuvant Chemotherapy   neoadjuvant chemotherapy will consist of carboplatin ,  docetaxel , trastuzumab , and pertuzumab  q. 21 days x 6 07/23/2020-11/06/2020 --two doses of Trastuzumab  given on 9/22 and 10/13   01/07/2021 Cancer Staging   Staging form: Breast, AJCC 8th Edition - Pathologic stage from 01/07/2021: No Stage Recommended (ypT2, pN24mi, cM0, G2) - Signed by Crawford Morna Pickle, NP on 01/15/2021 Stage prefix: Post-therapy Histologic grading system: 3 grade system   01/07/2021 Surgery   She underwent bilateral mastectomy on 01/07/2021 that showed: left breast no evidence of malignancy, and the right breast invasive ductal carcinoma grade 2, spanning fibrotic area of approximately 5 cm in patches.  Margins were negative.   (A) Microscopic metastatic carcinoma was noted in 1 out of 15 lymph nodes biopsied.   01/26/2021 - 10/14/2021 Chemotherapy   Patient is on Treatment Plan : BREAST ADO-Trastuzumab Emtansine  (Kadcyla ) q21d     02/13/2021 - 04/01/2021 Radiation Therapy   Site Technique Total Dose (Gy) Dose per Fx (Gy) Completed Fx Beam Energies  Chest Wall, Right: CW_R 3D 50.4/50.4 1.8 28/28 6XFFF, 10X  Chest Wall, Right: CW_R_SCLV 3D 50.4/50.4 1.8 28/28 6X, 10X  Chest Wall, Right: CW_R_Bst Electron 10/10 2 5/5 6E     04/08/2021 -  Anti-estrogen oral therapy   Antiestrogen therapy with Zolodex and AI starting 04/21/21     CHIEF COMPLIANT: Follow-up of metastatic breast cancer  HISTORY OF PRESENT ILLNESS:  History of Present Illness Kayla Little is a 39 year old female with metastatic ER/PR/HER2-positive breast cancer with brain metastases who presents for follow-up regarding new brain MRI findings and ongoing management of CNS disease and treatment-related toxicities.  Recent brain MRI showed two new lesions: a 5 mm right cerebellar hemisphere lesion and a 1.4 cm irregular left temporal-occipital periventricular white matter lesion with mild surrounding edema adjacent  to a previously treated site. She is concerned about edema due to prior seizures related  to swelling in a similar region but currently has no new neurologic symptoms. Recent PET showed no new systemic disease and stable C5 and T12 findings.  She stopped capecitabine  three doses ago due to significant xerosis of the feet, hyperpigmentation and yellow discoloration of the fingertips, and loss of a tooth. She continues neratinib  with mild diarrhea controlled with diet and occasional loperamide , without worsening beyond baseline. She remains on levetiracetam  for seizure prophylaxis and has had no recent seizures. She asks about starting tamoxifen  now that capecitabine  has been discontinued. She previously received ovarian suppression and anastrozole .  She notes persistent tightness and a pocket of fatty tissue on the left chest wall since prior surgery, more noticeable with her arm down and with increased activity such as pickleball and tennis. She is worried about lymphedema but has been told this is likely post-surgical fatty redistribution. She has occasional axillary cording and intermittently uses compression garments, which are affected by weight fluctuations.  She reports hearing about ivermectin and albendazole for brain metastases but is not using these and is not pursuing clinical trials at this time while standard treatments remain available. She recalls earlier brain imaging with six or seven lesions and later scans showing fewer lesions and wonders if this reflects treatment response or imaging interval differences. She continues brain MRI every three months and systemic imaging every six months.     ALLERGIES:  has no known allergies.  MEDICATIONS:  Current Outpatient Medications  Medication Sig Dispense Refill   acetaminophen  (TYLENOL ) 325 MG tablet Take 2 tablets (650 mg total) by mouth every 4 (four) hours as needed (for pain scale < 4). 100 tablet 0   diazePAM , 20 MG Dose, 2 x 10 MG/0.1ML LQPK Place 20 mg into the nose once as needed for up to 1 dose. Use for seizure  lasting greater than 2 minutes 2 each 0   levETIRAcetam  (KEPPRA ) 1000 MG tablet Take 1 tablet (1,000 mg total) by mouth 2 (two) times daily. 60 tablet 3   levonorgestrel (MIRENA) 20 MCG/DAY IUD 1 each by Intrauterine route once.     levothyroxine  (SYNTHROID ) 175 MCG tablet Take 175 mcg by mouth daily before breakfast.     LORazepam  (ATIVAN ) 0.5 MG tablet Take 1 tablet (0.5 mg total) by mouth every 12 (twelve) hours as needed for anxiety. 30 tablet 0   NERLYNX  40 MG tablet TAKE 6 TABLETS BY MOUTH 1 TIME A DAY 180 tablet 0   ondansetron  (ZOFRAN ) 8 MG tablet Take 1 tablet (8 mg total) by mouth every 8 (eight) hours as needed for nausea or vomiting. 20 tablet 3   sertraline  (ZOLOFT ) 50 MG tablet Take 50 mg by mouth at bedtime.     tamoxifen  (NOLVADEX ) 20 MG tablet Take 1 tablet (20 mg total) by mouth daily. 90 tablet 3   VYVANSE  60 MG capsule Take 60 mg by mouth every morning.     No current facility-administered medications for this visit.   Facility-Administered Medications Ordered in Other Visits  Medication Dose Route Frequency Provider Last Rate Last Admin   sodium chloride  flush (NS) 0.9 % injection 10 mL  10 mL Intravenous PRN Magrinat, Sandria BROCKS, MD        PHYSICAL EXAMINATION: ECOG PERFORMANCE STATUS: 1 - Symptomatic but completely ambulatory  Vitals:   03/28/24 1133  BP: 107/70  Pulse: 86  Resp: 16  Temp: 98 F (36.7 C)  SpO2: 100%   Filed Weights   03/28/24 1133  Weight: 182 lb 1.6 oz (82.6 kg)    Physical Exam MUSCULOSKELETAL: Pectoral muscle tightness. Tightness at surgical site. Tightness exacerbated with arm in a dependent position. Presence of fatty tissue, not fluid.  (exam performed in the presence of a chaperone)  LABORATORY DATA:  I have reviewed the data as listed    Latest Ref Rng & Units 12/07/2023    9:48 AM 10/20/2023   12:05 PM 08/11/2023   11:09 AM  CMP  Glucose 70 - 99 mg/dL 86  94  894   BUN 6 - 20 mg/dL 8  9  10    Creatinine 0.44 - 1.00 mg/dL 9.10   9.20  9.09   Sodium 135 - 145 mmol/L 137  139  136   Potassium 3.5 - 5.1 mmol/L 4.3  4.0  4.3   Chloride 98 - 111 mmol/L 105  103  103   CO2 22 - 32 mmol/L 24  28    Calcium  8.9 - 10.3 mg/dL 8.1  8.3    Total Protein 6.5 - 8.1 g/dL 7.3  7.2    Total Bilirubin 0.0 - 1.2 mg/dL 0.7  0.4    Alkaline Phos 38 - 126 U/L 46  47    AST 15 - 41 U/L 29  19    ALT 0 - 44 U/L 33  17      Lab Results  Component Value Date   WBC 7.4 12/07/2023   HGB 14.4 12/07/2023   HCT 41.9 12/07/2023   MCV 99.5 12/07/2023   PLT 329 12/07/2023   NEUTROABS 4.9 12/07/2023    ASSESSMENT & PLAN:  Malignant neoplasm of upper-inner quadrant of right breast in female, estrogen receptor positive (HCC) Stage IIIa triple positive breast cancer.   Treatment history: 1.  Initial diagnosis with T2N3 triple positive breast cancer PET scan showed regional adenopathy with no distant metastasis. 2.  Neoadjuvant chemotherapy with TCHP x6 from 07/23/2020 through 11/06/2020 3.  2 doses of Herceptin  on 11/27/2020 and 12/18/2020 4.  Bilateral mastectomies on January 07, 2021 which showed T2 N1 MI residual cancer. 5.  Patient began adjuvant Kadcyla  x14 cycles beginning January 26, 2021 through 10/14/2021 6.  Adjuvant radiation completed January 25 7.  Followed by adjuvant antiestrogen therapy with Zoladex  and anastrozole  8. Hospitalization: 08/11/23: Seizure: Brain Mets S/P SRS, CT CAP 08/12/23: No mets (inflamed colon) ______________________________________________________________________ Current treatment: Xeloda  with neratinib  started 09/27/2023 Toxicities: Intermittent diarrhea well-controlled with Imodium : I encouraged her to stay on neratinib  4 tablets a day. Mild nausea: Improved with Zofran  Dryness of the hands secondary to Xeloda    PET CT 09/28/23: Complete resolution of met activity T 12 bone met New brain metastasis: SRS to the brain planned Currently on Keppra .  She is only taking 1 tablet a day.  I sent a message to Dr.  Buckley if you would like to increase the dosage of Keppra . PET/CT 03/27/2024: Stable PET/CT findings without any evidence of metabolically active disease.  Stable sclerotic lesions C5 and T12 Brain MRI 03/27/2024: New 5 mm right cerebellar hemisphere and 1.4 mm metastases left temporal occipital brain metastasis  Discussion: Patient has appointment to see Dr. Ely as well as radiation oncology.  She will likely need SRS to the brain once again.  I discussed with her about the option of switching her treatment to Tucatinib with Xeloda  and Herceptin .  I will await the recommendation of Dr. Buckley before making any changes.  Our plan is to continue PET/CT scans every 6 months  Orders Placed This Encounter  Procedures   CBC with Differential (Cancer Center Only)    Standing Status:   Future    Expiration Date:   03/28/2025   CMP (Cancer Center only)    Standing Status:   Future    Expiration Date:   03/28/2025   CA 27.29    Standing Status:   Future    Expiration Date:   03/28/2025   Ambulatory referral to Physical Therapy    Referral Priority:   Routine    Referral Type:   Physical Medicine    Referral Reason:   Specialty Services Required    Requested Specialty:   Physical Therapy    Number of Visits Requested:   1   The patient has a good understanding of the overall plan. she agrees with it. she will call with any problems that may develop before the next visit here.  I personally spent a total of 30 minutes in the care of the patient today including preparing to see the patient, getting/reviewing separately obtained history, performing a medically appropriate exam/evaluation, counseling and educating, placing orders, referring and communicating with other health care professionals, documenting clinical information in the EHR, independently interpreting results, communicating results, and coordinating care.   Viinay K Morning Halberg, MD 03/28/24    "

## 2024-04-02 ENCOUNTER — Inpatient Hospital Stay

## 2024-04-02 ENCOUNTER — Ambulatory Visit: Admitting: Radiation Oncology

## 2024-04-02 NOTE — Progress Notes (Incomplete)
 I spoke with the patient today and let her know that the clinic is closed and our brain and spine conference did not meet today. I suspect Dr. Dewey is planning to ask for her MRI to be fused with her prior treamtent plan but that it looks like she will need at least one area treated with [facility]. She is comfortable with this plan and is aware of the risks, benefits, short, and long term effects of there therapy. We will be in touch later this week about recommendations. She is in agreement with this plan and sees Dr.Vaslow on Thursday.

## 2024-04-02 NOTE — Therapy (Signed)
 " OUTPATIENT PHYSICAL THERAPY  UPPER EXTREMITY ONCOLOGY EVALUATION  Patient Name: Kayla Little MRN: [id number] DOB:[birth year], 39 y.o., female Today's Date: 04/03/2024  END OF SESSION:  PT End of Session - 04/03/24 1709     Visit Number 1    Number of Visits 12    Date for Recertification  05/15/24    PT Start Time 1504    PT Stop Time 1604    PT Time Calculation (min) 60 min    Activity Tolerance Patient tolerated treatment well    Behavior During Therapy [facility] for tasks assessed/performed          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)    Hx of varicella    Kidney stone 2012   Medullary thyroid  carcinoma (HCC)    Monoallelic mutation of CHEK2 gene in female patient    Postpartum care following vaginal delivery (7/27) 10/02/2014, June 16 , 2020   Second degree perineal laceration 08/23/2018   SVD (spontaneous vaginal delivery) 08/23/2018   Thyroid  cancer (HCC) 2021   Past Surgical History:  Procedure Laterality Date   IR IMAGING GUIDED PORT INSERTION  07/22/2020   IR REMOVAL TUN ACCESS W/ PORT W/O FL MOD SED  11/12/2021   MODIFIED MASTECTOMY Right 01/07/2021   Procedure: RIGHT MODIFIED RADICAL MASTECTOMY;  Surgeon: Curvin Deward MOULD, MD;  Location: [facility];  Service: General;  Laterality: Right;   NO PAST SURGERIES     SIMPLE MASTECTOMY WITH AXILLARY SENTINEL NODE BIOPSY Left 01/07/2021   Procedure: LEFT SIMPLE MASTECTOMY;  Surgeon: Curvin Deward MOULD, MD;  Location: [facility];  Service: General;  Laterality: Left;   THYROIDECTOMY N/A 04/12/2019   Procedure: TOTAL THYROIDECTOMY WITH LIMITED LYMPH NODE DISSECTION;  Surgeon: Eletha Boas, MD;   Location: [facility];  Service: General;  Laterality: N/A;   UNILATERAL SALPINGECTOMY Right 07/09/2017   Procedure: RIGHT SALPINGECTOMY WITH REMOVAL OF ECTOPIC PREGNANCY;  Surgeon: Barbette Knock, MD;  Location: [facility];  Service: Gynecology;  Laterality: Right;   Patient Active Problem List   Diagnosis Date Noted   Depression 08/12/2023   Aphasia 08/12/2023   Metastasis to brain (HCC) 08/11/2023   Port-A-Cath in place 03/10/2021   Anxiety 01/23/2021   Attention deficit disorder 01/23/2021   History of medullary carcinoma of thyroid  01/23/2021   History of substance abuse (HCC) 01/23/2021   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   Prolonged QT interval 07/30/2020   Bone lesion 07/23/2020   MEN 2A (multiple endocrine neoplasia, type 2A) (HCC) 04/12/2019   Multiple thyroid  nodules 04/08/2019   Family history of breast cancer    Family history of prostate cancer    Family history of lymphoma    Family history of ovarian cancer    Monoallelic mutation of CHEK2 gene in female patient    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 Mackey Chad  REFERRING  DIAG: Right UE pain, Lymphedema,cording  THERAPY DIAG:  Malignant neoplasm of upper-inner quadrant of right breast in female, estrogen receptor positive (HCC)  Stiffness of right shoulder, not elsewhere classified  ONSET DATE: November/December 2025  Rationale for Evaluation and Treatment: Rehabilitation  SUBJECTIVE:                                                                                                                                                                                           SUBJECTIVE STATEMENT:  In June she had an aphasia attack and scans showed 6 brain lesions. I got into  pickle ball after that. I am right handed and have been playing that more than tennis. The weather hit and I didn't play much, but in December I played more. The skin on my Right chest feels really taut. I am a little tight in the axilla.  I feel like I have love handles under bilateral axilla.She notices increased swelling in right axilla and lateral trunk, but no swelling noted in arm My sleeve felt a little tighter so I may need to be measured again for a new one. I did wear the sleeve  to play and it seemed to helped. The left side feels like swelling but Dr. Odean said it may be fatty deposits.  Her PET scan looks great and she has one every 6 months and MRI every 3 months. The compression bra does seem to help with the swelling but feels too tight because she usually doesn't wear a bra. She has had SRS radiation for the brain METs and it has been working well.  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 She underwent neoadjuvant chemotherapy from 07/23/2020-11/06/2020 followed by a bilateral mastectomy with a right axillary lymph node dissection (1/15 nodes positive). She has a hx of medullary thyroid  cancer with a thyroidectomy 04/12/2019. She has a more recent history of brain METS with [facility] 10/16-10/22/2026 and seizures for which she takes Keppra .  She notices increased swelling in right axilla and lateral trunk, but no swelling noted in arm  PAIN:  Are you having pain? Yes, more discomfort NPRS scale: 3/10 after working out Pain location: right axilla, lateral trunk Pain orientation: Right  PAIN TYPE: tight and feels swollen Pain description: intermittent  Aggravating factors: pickle ball, tennis, work outs Relieving factors: time/rest/compression sleeve.  PRECAUTIONS: right UE lymphedema risk  RED FLAGS: None   WEIGHT BEARING RESTRICTIONS: No  FALLS:  Has patient fallen in last 6 months? Yes.  Number of falls 1  LIVING ENVIRONMENT: Lives with: lives  with their family Lives in: House/apartment Stairs: Yes; Internal: 10 steps; on right going up   OCCUPATION: accountant, own business  LEISURE: playing with kids, pickle ball  HAND DOMINANCE: right   PRIOR LEVEL OF FUNCTION: Independent  PATIENT GOALS: to know how to move forward, check for swelling at inferior axilla on right,   OBJECTIVE: Note: Objective measures were completed at Evaluation unless otherwise noted.  COGNITION: Overall cognitive status: Within functional limits for tasks assessed   PALPATION: Right chest incision adhered to chest wall, very taut. Right axilla with several taut cords extending into upper arm, mildly limits abduction  OBSERVATIONS / OTHER ASSESSMENTS: left inferior axilla with excess soft tissue, likely fatty due to doesn't ever change. Right side however, with what sounds like swelling as it comes and goes after she plays tennis and seems to respond to compression. See photos in Media  SENSATION: Light touch: Deficits     POSTURE: forward head, rounded shoulders  UPPER EXTREMITY AROM/PROM:  A/PROM RIGHT   eval   Shoulder extension 52  Shoulder flexion 160  Shoulder abduction 167  Shoulder internal rotation 65  Shoulder external rotation 105    (Blank rows = not tested)  A/PROM LEFT   eval  Shoulder extension 59  Shoulder flexion 162  Shoulder abduction 179  Shoulder internal rotation 68  Shoulder external rotation 104    (Blank rows = not tested)  CERVICAL AROM: All within fxl limits:      UPPER EXTREMITY STRENGTH:   LYMPHEDEMA ASSESSMENTS:   SURGERY TYPE/DATE: 01/07/2021 Bilateral Mastectomies (left Prophylactic)  NUMBER OF LYMPH NODES REMOVED: 1+/15  CHEMOTHERAPY: YES  RADIATION:YES, Breast, and SRS to brain mets 10/16-10/22/2025  HORMONE TREATMENT: YES  INFECTIONS: NO   LYMPHEDEMA ASSESSMENTS:   LANDMARK RIGHT  eval  At axilla  37  15 cm  proximal to the proximal aspect of the olecranon process 33.2  10 cm proximal to the proximal aspect of the olecranon process 30.5  Olecranon process 25.5  15 cm proximal to the proximal aspect of the ulnar styloid process 25.7  10 cm proximal to the proximal aspect of the ulnar styloid process 23.7  Just distal to the ulnar styloid process 15.9  Across hand at thumb web space 20.1  At base of 2nd digit 6.5  (Blank rows = not tested)  LANDMARK LEFT  eval  At axilla  34.8  15 cm proximal to the proximal aspect of the olecranon process 33.0  10 cm proximal to the proximal aspect of the olecranon process 29.8  Olecranon process 25.2  15 cm proximal to the proximal aspect of the ulnar styloid process 24.2  10 cm proximal to  the proximal aspect of the ulnar styloid process 22.6  Just distal to the ulnar styloid process 15.6  Across hand at thumb web space 19.7  At base of 2nd digit 6.5  (Blank rows = not tested)  Chest circumference just inferior to the axillae:  Chest circumference at the largest point:       L-DEX LYMPHEDEMA SCREENING: The patient was assessed using the L-Dex machine today to produce a lymphedema index baseline score. The patient will be reassessed on a regular basis (typically every 3 months) to obtain new L-Dex scores. If the score is > 6.5 points away from his/her baseline score indicating onset of subclinical lymphedema, it will be recommended to wear a compression garment for 4 weeks, 12 hours per day and then be reassessed. If the score continues to be >  6.5 points from baseline at reassessment, we will initiate lymphedema treatment. Assessing in this manner has a 95% rate of preventing clinically significant lymphedema.  BREAST COMPLAINTS QUESTIONNAIRE Pain:3 Heaviness:0 Swollen feeling:10 Tense Skin:10 right Redness:0 Bra Print:5 Size of Pores:0 Hard feeling: 3 Total:    31 /80 A Score over 9 indicates lymphedema issues in the breast                                                                                                                              TREATMENT DATE:  Pt evaluated; Discussed POC, treatment interventions, LOS    PATIENT EDUCATION:  Education details: Discussed POC, treatment interventions, LOS Person educated: Patient Education method: Explanation Education comprehension: verbalized understanding  HOME EXERCISE PROGRAM: None given today  ASSESSMENT:  CLINICAL IMPRESSION: Patient is a 39 y.o. female who was seen today for physical therapy evaluation and treatment for complaints of right UE pain, cording and swelling/lymphedema. Pt has mild limitation in right shoulder abduction, with several cords noted in the axilla. Right chest wall with incision noted to be adhered to chest wall, and concerns of swelling after activity at the inferior axillary region/lateral trunk.  Swelling may in part be caused by tight chest tissue which should respond to manual techniques and cupping to loosen tissue. She may benefit from a good compression bra in particular while exercising, and in instruction in manual lymphatic drainage to keep things moving. She will bring her garments to be checked next visit. There is no sign of UE lymphedema with circumferential measurements. She will benefit from skilled Pt to address deficits noted above to return to PLOF.   OBJECTIVE IMPAIRMENTS: decreased knowledge of condition, decreased mobility, decreased ROM, increased edema, increased fascial restrictions, impaired UE functional use, postural dysfunction, and pain.   ACTIVITY LIMITATIONS: reach over head  PARTICIPATION LIMITATIONS: able to do all with occasional limitation/swelling  PERSONAL FACTORS 1-2 comorbidities: Right breast CA. S/p chemo and radiation  are also affecting patient's functional outcome.   REHAB POTENTIAL: Good  CLINICAL DECISION MAKING: Stable/uncomplicated  EVALUATION COMPLEXITY: Low  GOALS: Goals reviewed with  patient? Yes  SHORT TERM GOALS=LONG TERM GOALS: Target date: 05/15/2024  Pt will have decreased right UQ discomfort  by 50% or greater  Baseline: Goal status: INITIAL  2.  Pt will improve Right shoulder ROM for abd to 177 degrees for improved reach  Baseline:  Goal status: INITIAL  3.  Pt will be independent with a HEP for Right shoulder ROM, and pectoral stretching Baseline:  Goal status: INITIAL  4.  Patient will be independent in MLD to right lateral trunk region to decrease swelling  Baseline:  Goal status: INITIAL  5.  Pt will have appropriate compression garments for right UE and right trunk swelling Baseline:  Goal status: INITIAL   PLAN:  PT FREQUENCY: 2x/week  PT DURATION: 6 weeks  PLANNED INTERVENTIONS: 97164- PT Re-evaluation, 97750- Physical Performance Testing, 97110-Therapeutic exercises, 97530- Therapeutic activity,  02887- Neuromuscular re-education, [phone]- Self Care, 02859- Manual therapy, [phone]- Orthotic Initial, [phone]- Orthotic/Prosthetic subsequent, Therapeutic exercises, Therapeutic activity, Neuromuscular re-education, Gait training, and Self Care  PLAN FOR NEXT SESSION: SOZO screen, supine wand scaption, doorway pec stretch,MFR to Right chest incision, scar mobilzation,  cord release techniques prn,cupping, educated in MLD to right lateral trunk, compression bra/tank, sleeve prn  Grayce JINNY Sheldon, PT 04/03/2024, 5:10 PM  "

## 2024-04-03 ENCOUNTER — Ambulatory Visit: Attending: Hematology and Oncology

## 2024-04-03 ENCOUNTER — Other Ambulatory Visit: Payer: Self-pay

## 2024-04-03 DIAGNOSIS — R0789 Other chest pain: Secondary | ICD-10-CM | POA: Insufficient documentation

## 2024-04-03 DIAGNOSIS — R600 Localized edema: Secondary | ICD-10-CM | POA: Insufficient documentation

## 2024-04-03 DIAGNOSIS — I890 Lymphedema, not elsewhere classified: Secondary | ICD-10-CM | POA: Insufficient documentation

## 2024-04-03 DIAGNOSIS — Z170 Estrogen receptor positive status [ER+]: Secondary | ICD-10-CM | POA: Diagnosis present

## 2024-04-03 DIAGNOSIS — M25611 Stiffness of right shoulder, not elsewhere classified: Secondary | ICD-10-CM | POA: Diagnosis present

## 2024-04-03 DIAGNOSIS — C50211 Malignant neoplasm of upper-inner quadrant of right female breast: Secondary | ICD-10-CM | POA: Insufficient documentation

## 2024-04-05 ENCOUNTER — Encounter: Payer: Self-pay | Admitting: Hematology and Oncology

## 2024-04-05 ENCOUNTER — Inpatient Hospital Stay: Admitting: Internal Medicine

## 2024-04-05 VITALS — BP 104/75 | HR 83 | Temp 97.5°F | Resp 20 | Wt 180.5 lb

## 2024-04-05 DIAGNOSIS — C7931 Secondary malignant neoplasm of brain: Secondary | ICD-10-CM

## 2024-04-05 DIAGNOSIS — C50211 Malignant neoplasm of upper-inner quadrant of right female breast: Secondary | ICD-10-CM | POA: Diagnosis not present

## 2024-04-05 NOTE — Progress Notes (Signed)
 "  [facility] [address]  [place], [place] [address] [phone]   Interval Evaluation  Date of Service: 04/05/24 Patient Name: Kayla Little Patient MRN: [id number] Patient DOB: 18-Oct-1985 Provider: Arthea MARLA Manns, MD  Identifying Statement:  Kayla Little is a 39 y.o. female with Metastasis to brain [facility]) - Plan: MR BRAIN W WO CONTRAST    Primary Cancer:  Oncologic History: Oncology History  Malignant neoplasm of upper-inner quadrant of right breast in female, estrogen receptor positive (HCC)  07/08/2020 Initial Diagnosis   a clinical mT2 N3, stage IIIA invasive ductal carcinoma, grade 1, triple positive  (a) breast MRI 07/06/2020 finds a 1.6 cm lower inner quadrant mass associated with 5.4 cm of non-mass-like enhancement, at least 8 enlarged right axillary lymph node and 1 right internal mammary lymph node  (b) CT scans of the neck and chest 06/24/2020 shows subcentimeter right cervical adenopathy, 2 right supraclavicular lymph nodes the larger 0.65 cm, also left cervical adenopathy largest measuring 1.02 cm; there is also a 1.2 cm lesion in the left hepatic lobe, and a sclerotic lesion in the mid thoracic spine at T7  (c) PET scan 07/21/2020 shows regional adenopathy (including 2 right subcentimeter supraclavicular lymph nodes and subcentimeter lymph nodes in the right hilar, subcarinal and lateral aortic region), no uptake in liver, and an indeterminate T12 lesion  (d) thoracic spine MRI 07/27/2020 shows the T12 lesion to be likely benign, there is also degenerative disc disease   07/23/2020 - 12/18/2020 Neo-Adjuvant Chemotherapy   neoadjuvant chemotherapy will consist of carboplatin , docetaxel , trastuzumab , and pertuzumab  q. 21 days x 6 07/23/2020-11/06/2020 --two doses of Trastuzumab  given on 9/22 and 10/13   01/07/2021 Cancer Staging   Staging form: Breast, AJCC 8th Edition - Pathologic stage from 01/07/2021: No Stage Recommended (ypT2,  pN49mi, cM0, G2) - Signed by Crawford Morna Pickle, NP on 01/15/2021 Stage prefix: Post-therapy Histologic grading system: 3 grade system   01/07/2021 Surgery   She underwent bilateral mastectomy on 01/07/2021 that showed: left breast no evidence of malignancy, and the right breast invasive ductal carcinoma grade 2, spanning fibrotic area of approximately 5 cm in patches.  Margins were negative.   (A) Microscopic metastatic carcinoma was noted in 1 out of 15 lymph nodes biopsied.   01/26/2021 - 10/14/2021 Chemotherapy   Patient is on Treatment Plan : BREAST ADO-Trastuzumab Emtansine  (Kadcyla ) q21d     02/13/2021 - 04/01/2021 Radiation Therapy   Site Technique Total Dose (Gy) Dose per Fx (Gy) Completed Fx Beam Energies  Chest Wall, Right: CW_R 3D 50.4/50.4 1.8 28/28 6XFFF, 10X  Chest Wall, Right: CW_R_SCLV 3D 50.4/50.4 1.8 28/28 6X, 10X  Chest Wall, Right: CW_R_Bst Electron 10/10 2 5/5 6E     04/08/2021 -  Anti-estrogen oral therapy   Antiestrogen therapy with Zolodex and AI starting 04/21/21    CNS Oncologic History 08/31/23: SRS for 6 metastases ([facility]) 12/28/23: SRS x3 additional mets [facility])  Interval History: Kayla Little presents today for follow up after recent MRI brain.  No new or progressive neurologic changes.  Doesn't report further seizures. Continues on Keppra  1000mg  twice per day without issue.  Continues on Neratinib  with Dr. Gudena, stopping dosing Xeloda  due to skin issues.  H+P (08/29/23) Patient presents today for evaluation after CNS complaints, brain MRI.  She describes an episode of unable to speak on 08/11/23, lasting for hour before return to prior baseline.  CNS imaging was obtained and demonstrated several brain metastases  from breast primary.  Was started on Keppra  1250mg  BID for presumed seizure, as well as decadron  4mg  three times per day.  She has been off systemic therapy for some time, previously had followed with Dr. Gudena.  No neurologic complaints  today.  Medications: Current Outpatient Medications on File Prior to Visit  Medication Sig Dispense Refill   acetaminophen  (TYLENOL ) 325 MG tablet Take 2 tablets (650 mg total) by mouth every 4 (four) hours as needed (for pain scale < 4). 100 tablet 0   diazePAM , 20 MG Dose, 2 x 10 MG/0.1ML LQPK Place 20 mg into the nose once as needed for up to 1 dose. Use for seizure lasting greater than 2 minutes 2 each 0   levETIRAcetam  (KEPPRA ) 1000 MG tablet Take 1 tablet (1,000 mg total) by mouth 2 (two) times daily. 60 tablet 3   levonorgestrel (MIRENA) 20 MCG/DAY IUD 1 each by Intrauterine route once.     levothyroxine  (SYNTHROID ) 175 MCG tablet Take 175 mcg by mouth daily before breakfast.     LORazepam  (ATIVAN ) 0.5 MG tablet Take 1 tablet (0.5 mg total) by mouth every 12 (twelve) hours as needed for anxiety. 30 tablet 0   NERLYNX  40 MG tablet TAKE 6 TABLETS BY MOUTH 1 TIME A DAY 180 tablet 0   ondansetron  (ZOFRAN ) 8 MG tablet Take 1 tablet (8 mg total) by mouth every 8 (eight) hours as needed for nausea or vomiting. 20 tablet 3   sertraline  (ZOLOFT ) 50 MG tablet Take 50 mg by mouth at bedtime.     tamoxifen  (NOLVADEX ) 20 MG tablet Take 1 tablet (20 mg total) by mouth daily. 90 tablet 3   VYVANSE  60 MG capsule Take 60 mg by mouth every morning.     [DISCONTINUED] prochlorperazine  (COMPAZINE ) 10 MG tablet Take 1 tablet (10 mg total) by mouth every 6 (six) hours as needed (Nausea or vomiting). 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)    Hx of varicella    Kidney stone 2012   Medullary thyroid  carcinoma (HCC)    Monoallelic mutation of CHEK2 gene in female patient    Postpartum care following vaginal delivery (7/27) 10/02/2014, June 16 , 2020   Second degree perineal laceration 08/23/2018   SVD (spontaneous vaginal delivery) 08/23/2018   Thyroid  cancer (HCC) 2021   Past Surgical History:  Past Surgical History:  Procedure Laterality Date   IR IMAGING GUIDED PORT INSERTION  07/22/2020   IR REMOVAL TUN ACCESS W/ PORT W/O FL MOD SED  11/12/2021   MODIFIED MASTECTOMY Right 01/07/2021   Procedure: RIGHT MODIFIED RADICAL MASTECTOMY;  Surgeon: Curvin Deward MOULD, MD;  Location: [facility];  Service: General;  Laterality: Right;   NO PAST SURGERIES     SIMPLE MASTECTOMY WITH  AXILLARY SENTINEL NODE BIOPSY Left 01/07/2021   Procedure: LEFT SIMPLE MASTECTOMY;  Surgeon: Curvin Deward MOULD, MD;  Location: [facility];  Service: General;  Laterality: Left;   THYROIDECTOMY N/A 04/12/2019   Procedure: TOTAL THYROIDECTOMY WITH LIMITED LYMPH NODE DISSECTION;  Surgeon: Eletha Boas, MD;  Location: [facility];  Service: General;  Laterality: N/A;   UNILATERAL SALPINGECTOMY Right 07/09/2017   Procedure: RIGHT SALPINGECTOMY WITH REMOVAL OF ECTOPIC PREGNANCY;  Surgeon: Barbette Knock, MD;  Location: [facility];  Service: Gynecology;  Laterality: Right;   Social History:  Social History   Socioeconomic History   Marital status: Married    Spouse name: Not on file   Number of children: Not on file   Years of education: Not on file   Highest education level: Not on file  Occupational History   Occupation: homemaker  Tobacco Use   Smoking status: Some Days    Current packs/day: 0.50    Average packs/day: 0.5 packs/day for 5.0 years (2.5 ttl pk-yrs)    Types: Cigarettes   Smokeless  tobacco: Never   Tobacco comments:    Actively trying to quit.  Vaping Use   Vaping status: Never Used  Substance and Sexual Activity   Alcohol use: Yes    Alcohol/week: 7.0 - 14.0 standard drinks of alcohol    Types: 7 - 14 Glasses of wine per week    Comment: glass of wine each night    Drug use: Not Currently    Types: Marijuana    Comment: not during pregnancy, last use was 03/27/2019   Sexual activity: Yes    Birth control/protection: None, I.U.D.  Other Topics Concern   Not on file  Social History Narrative   Not on file   Social Drivers of Health   Tobacco Use: High Risk (04/03/2024)   Patient History    Smoking Tobacco Use: Some Days    Smokeless Tobacco Use: Never    Passive Exposure: Not on file  Financial Resource Strain: Not on file  Food Insecurity: No Food Insecurity (08/12/2023)   Hunger Vital Sign    Worried About Running Out of Food in the Last Year: Never true    Ran Out of Food in the Last Year: Never true  Transportation Needs: No Transportation Needs (08/12/2023)   PRAPARE - Administrator, Civil Service (Medical): No    Lack of Transportation (Non-Medical): No  Physical Activity: Not on file  Stress: Not on file  Social Connections: Not on file  Intimate Partner Violence: Not At Risk (08/12/2023)   Humiliation, Afraid, Rape, and Kick questionnaire    Fear of Current or Ex-Partner: No    Emotionally Abused: No    Physically Abused: No    Sexually Abused: No  Depression (PHQ2-9): Low Risk (04/05/2024)   Depression (PHQ2-9)    PHQ-2 Score: 0  Alcohol Screen: Not on file  Housing: Unknown (08/12/2023)   Housing Stability Vital Sign    Unable to Pay for Housing in the Last Year: No    Number of Times Moved in the Last Year: Not on file    Homeless in the Last Year: No  Utilities: Not At Risk (08/12/2023)   [employer] Utilities    Threatened with loss of utilities: No  Health Literacy: Not on file   Family History:  Family History  Problem Relation  Age of Onset   Breast cancer Mother 53       2 types of cancer one side  mastectomy with chemo   Endometriosis Mother    Endometriosis Maternal Grandmother    Breast cancer Maternal Grandmother        post menopausal   Diabetes Maternal Grandmother    Cancer Maternal Grandfather        prostate and lymphoma   Alzheimer's disease Paternal Grandmother    Cancer Father 26       prostate cancer   Aneurysm Paternal Grandfather        d. 30s    Review of Systems: Constitutional: Doesn't report fevers, chills or abnormal weight loss Eyes: Doesn't report blurriness of vision Ears, nose, mouth, throat, and face: Doesn't report sore throat Respiratory: Doesn't report cough, dyspnea or wheezes Cardiovascular: Doesn't report palpitation, chest discomfort  Gastrointestinal:  Doesn't report nausea, constipation, diarrhea GU: Doesn't report incontinence Skin: Doesn't report skin rashes Neurological: Per HPI Musculoskeletal: Doesn't report joint pain Behavioral/Psych: Doesn't report anxiety  Physical Exam: Vitals:   04/05/24 1035  BP: 104/75  Pulse: 83  Resp: 20  Temp: (!) 97.5 F (36.4 C)  SpO2: 99%   KPS: 90. General: Alert, cooperative, pleasant, in no acute distress Head: Normal EENT: No conjunctival injection or scleral icterus.  Lungs: Resp effort normal Cardiac: Regular rate Abdomen: Non-distended abdomen Skin: No rashes cyanosis or petechiae. Extremities: No clubbing or edema  Neurologic Exam: Mental Status: Awake, alert, attentive to examiner. Oriented to self and environment. Language is fluent with intact comprehension.  Cranial Nerves: Visual acuity is grossly normal. Visual fields are full. Extra-ocular movements intact. No ptosis. Face is symmetric Motor: Tone and bulk are normal. Power is full in both arms and legs. Reflexes are symmetric, no pathologic reflexes present.  Sensory: Intact to light touch Gait: Normal.   Labs: I have reviewed the data as listed     Component Value Date/Time   NA 137 12/07/2023 0948   NA 142 02/16/2017 1332   K 4.3 12/07/2023 0948   CL 105 12/07/2023 0948   CO2 24 12/07/2023 0948   GLUCOSE 86 12/07/2023 0948   BUN 8 12/07/2023 0948   BUN 9 02/16/2017 1332   CREATININE 0.89 12/07/2023 0948   CREATININE 0.82 12/04/2012 1732   CALCIUM  8.1 (L) 12/07/2023 0948   PROT 7.3 12/07/2023 0948   PROT 6.5 02/16/2017 1332   ALBUMIN  4.7 12/07/2023 0948   ALBUMIN  4.3 02/16/2017 1332   AST 29 12/07/2023 0948   ALT 33 12/07/2023 0948   ALKPHOS 46 12/07/2023 0948   BILITOT 0.7 12/07/2023 0948   GFRNONAA >60 12/07/2023 0948   GFRAA >60 04/15/2019 2145   Lab Results  Component Value Date   WBC 7.4 12/07/2023   NEUTROABS 4.9 12/07/2023   HGB 14.4 12/07/2023   HCT 41.9 12/07/2023   MCV 99.5 12/07/2023   PLT 329 12/07/2023    Imaging:  [facility] Clinician Interpretation: I have personally reviewed the CNS images as listed.  My interpretation, in the context of the patient's clinical presentation, is progressive disease  MR Brain W Wo Contrast Result Date: 03/27/2024 EXAM: MRI BRAIN WITH AND WITHOUT CONTRAST 03/27/2024 11:43:56 AM TECHNIQUE: Multiplanar multisequence MRI of the head/brain was performed with and without the administration of intravenous contrast. CONTRAST: 8 mL of Vueway . COMPARISON: MRI Head 12/01/2023. CLINICAL HISTORY: Breast cancer. FINDINGS: BRAIN AND VENTRICLES: There is a new 5 mm enhancing lesion superiorly in the right cerebellar hemisphere without edema (series 13 image 67). The intervally treated left temporooccipital lesion has nearly completely resolved with only a punctate focus of faint  enhancement remaining (series 13 image 79), however there is a new 14 mm focus of irregular enhancement slightly superior and lateral to this within the periventricular white matter (series 13 image 84) with associated mild edema and no mass effect. Intervally treated left parietal and right occipital lesions have  resolved. There is decreased, minimal residual edema superiorly in the right occipital lobe. Previously treated lesions measuring 10 mm in the posterior left temporal lobe (series 13 image 83) and 9 mm in the right parietal lobe (series 13 image 124) are stable to slightly smaller. There are chronic blood products associated with previously treated lesions in the left thalamus, left temporal lobe, and right cerebellar hemisphere. No acute infarct, midline shift, hydrocephalus, or extra-axial fluid collection is identified. Major intracranial vascular flow voids are preserved. ORBITS: No significant abnormality. SINUSES: No significant abnormality. BONES AND SOFT TISSUES: Normal bone marrow signal. No soft tissue abnormality. IMPRESSION: 1. New 5 mm metastasis superiorly in the right cerebellar hemisphere. 2. New 14 mm focus of irregular enhancement in the left temporooccipital periventricular white matter with mild edema. This is adjacent to an intervally treated lesion and could reflect a new metastasis or post-treatment effect. 3. Essentially resolved enhancement associated with intervally treated left temporooccipital, left parietal, and right occipital metastases. Electronically signed by: Dasie Hamburg MD 03/27/2024 04:32 PM EST RP Workstation: [id number]   NM PET Image Restag (PS) Skull Base To Thigh Result Date: 03/27/2024 CLINICAL DATA:  Subsequent treatment strategy for metastatic breast cancer. Assess treatment response. EXAM: NUCLEAR MEDICINE PET SKULL BASE TO THIGH TECHNIQUE: 8.9 mCi F-18 FDG was injected intravenously. Full-ring PET imaging was performed from the skull base to thigh after the radiotracer. CT data was obtained and used for attenuation correction and anatomic localization. Fasting blood glucose: 107 mg/dl COMPARISON:  PET-CT 92/78/7974. CT of the chest, abdomen and pelvis 08/12/2023. FINDINGS: Mediastinal blood pool activity: SUV max 2.4 NECK: No hypermetabolic cervical lymph nodes are  identified. No suspicious activity identified within the pharyngeal mucosal space. Incidental CT findings: Postsurgical changes from previous thyroidectomy. CHEST: There are no hypermetabolic mediastinal, hilar, axillary or internal mammary lymph nodes. No hypermetabolic pulmonary activity or suspicious nodularity. Incidental CT findings: Mildly progressive fibrotic changes at the right lung apex and anterior right upper lobe attributed to radiation therapy. Bilateral mastectomy and axillary node dissection. ABDOMEN/PELVIS: There is no hypermetabolic activity within the liver, adrenal glands, spleen or pancreas. There is no hypermetabolic nodal activity in the abdomen or pelvis. Nonspecific low uptake within the left adnexa (SUV max 5.2). This appears similar to previous PET-CT, likely physiologic. No suspicious adnexal findings. Incidental CT findings: IUD in place. Circumferential left renal vein again noted. SKELETON: There is no hypermetabolic activity to suggest metabolically active osseous metastatic disease. Stable sclerosis in the left aspect of the T12 vertebral body attributed to a treated metastasis. Sclerotic lesion in the right aspect of C5 is also stable, without metabolic activity. No evidence of chest wall recurrence. Incidental CT findings: Stable densely sclerotic lesion in T7, previously ascribed to a bone island. IMPRESSION: 1. Stable PET-CT without evidence of metabolically active local recurrence or metastatic disease. 2. Stable sclerotic lesions in the C5 and T12 vertebral bodies, attributed to treated metastases. 3. Mildly progressive radiation changes in the right lung. Electronically Signed   By: Elsie Perone M.D.   On: 03/27/2024 11:58    Assessment/Plan No diagnosis found.  RILLEY POULTER is clinically stable today.  MRI brain demonstrates 2 additional novel foci of enhancement c/w  metastases.  All previously treated lesions appear stable.  MRI fusion was performed, there is no  overlap with prior radiation dosimetry.  Will recommend proceeding with SRS as discussed, she will see Dr. Dewey next week.  Will discuss with Dr. Odean possibility of utilizing HER2 agent such as Tucatinib to aid with CNS control and prevent further seeding.    For seizures, recommended continuing Keppra  1000mg  BID.  Should remain off decadron  if tolerated.  We appreciate the opportunity to participate in the care of Kayla Little.   We ask that Kayla Little return to clinic in 3 months following next brain MRI, or sooner as needed.  All questions were answered. The patient knows to call the clinic with any problems, questions or concerns. No barriers to learning were detected.  The total time spent in the encounter was 40 minutes and more than 50% was on counseling and review of test results   Arthea MARLA Manns, MD Medical Director of Neuro-Oncology [facility] at [facility] 04/05/24 10:58 AM "

## 2024-04-06 ENCOUNTER — Telehealth: Payer: Self-pay

## 2024-04-06 ENCOUNTER — Telehealth: Payer: Self-pay | Admitting: Internal Medicine

## 2024-04-06 NOTE — Telephone Encounter (Signed)
 Scheduled patient for next appt. Called and left a vm with the details.

## 2024-04-06 NOTE — Telephone Encounter (Signed)
-----   Message from Mackey Chad, MD sent at 04/05/2024  4:49 PM EST ----- Leita, Can you please arrange an appointment for her in the next 1 to 2 weeks? Thank you ----- Message ----- From: Vaslow, Zachary K, MD Sent: 04/05/2024  12:07 PM EST To: Norleen Limes, MD; Donald Estefana Husband, PA-C;#  Mackey,  As you know Ms. Megna has 2 small new CNS mets.  These can be treated with additional SRS.   In clinic we presented some of the [employer] data regarding Tucatinib.  They understand that could be discussed with you and your team, if she is deemed a candidate.    Otherwise we will plan to proceed with SRS.  Best, Zach

## 2024-04-06 NOTE — Telephone Encounter (Signed)
 Attempted to call pt to schedule MD appt. LVM for call back.

## 2024-04-09 ENCOUNTER — Ambulatory Visit: Admitting: Rehabilitation

## 2024-04-09 ENCOUNTER — Inpatient Hospital Stay

## 2024-04-10 ENCOUNTER — Other Ambulatory Visit: Payer: Self-pay | Admitting: Radiation Oncology

## 2024-04-10 DIAGNOSIS — C7931 Secondary malignant neoplasm of brain: Secondary | ICD-10-CM

## 2024-04-11 ENCOUNTER — Ambulatory Visit: Admission: RE | Admit: 2024-04-11 | Discharge: 2024-04-11 | Attending: Hematology and Oncology

## 2024-04-11 ENCOUNTER — Ambulatory Visit
Admission: RE | Admit: 2024-04-11 | Discharge: 2024-04-11 | Disposition: A | Source: Ambulatory Visit | Attending: Radiation Oncology

## 2024-04-11 ENCOUNTER — Ambulatory Visit
Admission: RE | Admit: 2024-04-11 | Discharge: 2024-04-11 | Disposition: A | Source: Ambulatory Visit | Attending: Radiation Oncology | Admitting: Radiation Oncology

## 2024-04-11 DIAGNOSIS — C7931 Secondary malignant neoplasm of brain: Secondary | ICD-10-CM

## 2024-04-11 LAB — CMP (CANCER CENTER ONLY)
ALT: 23 U/L (ref 0–44)
AST: 27 U/L (ref 15–41)
Albumin: 4.7 g/dL (ref 3.5–5.0)
Alkaline Phosphatase: 51 U/L (ref 38–126)
Anion gap: 12 (ref 5–15)
BUN: 10 mg/dL (ref 6–20)
CO2: 25 mmol/L (ref 22–32)
Calcium: 8.5 mg/dL — ABNORMAL LOW (ref 8.9–10.3)
Chloride: 101 mmol/L (ref 98–111)
Creatinine: 0.84 mg/dL (ref 0.44–1.00)
GFR, Estimated: >60 mL/min
Glucose, Bld: 98 mg/dL (ref 70–99)
Potassium: 4.6 mmol/L (ref 3.5–5.1)
Sodium: 138 mmol/L (ref 135–145)
Total Bilirubin: 0.5 mg/dL (ref 0.0–1.2)
Total Protein: 7.3 g/dL (ref 6.5–8.1)

## 2024-04-11 NOTE — Progress Notes (Signed)
 Has armband been applied?  Yes.    Does patient have an allergy to IV contrast dye?: No.   Has patient ever received premedication for IV contrast dye?: n/a   Does patient take metformin?: No.  If patient does take metformin when was the last dose: n/a  Date of lab work: 04/11/2024 BUN: 10 CR: 0.84 eGfr: >60  IV site: Right AC  Has IV site been added to flowsheet?  Yes.

## 2024-04-12 ENCOUNTER — Ambulatory Visit

## 2024-04-16 ENCOUNTER — Ambulatory Visit

## 2024-04-16 ENCOUNTER — Inpatient Hospital Stay: Attending: Radiation Oncology | Admitting: Hematology and Oncology

## 2024-04-17 ENCOUNTER — Ambulatory Visit: Admitting: Radiation Oncology

## 2024-04-19 ENCOUNTER — Ambulatory Visit: Admitting: Rehabilitation

## 2024-04-19 ENCOUNTER — Ambulatory Visit: Admitting: Radiation Oncology

## 2024-04-23 ENCOUNTER — Ambulatory Visit: Admitting: Radiation Oncology

## 2024-04-23 ENCOUNTER — Ambulatory Visit

## 2024-04-26 ENCOUNTER — Ambulatory Visit: Admitting: Rehabilitation

## 2024-04-30 ENCOUNTER — Ambulatory Visit: Admitting: Rehabilitation

## 2024-05-03 ENCOUNTER — Ambulatory Visit: Admitting: Rehabilitation

## 2024-05-07 ENCOUNTER — Ambulatory Visit

## 2024-05-10 ENCOUNTER — Ambulatory Visit

## 2024-05-21 ENCOUNTER — Ambulatory Visit

## 2024-06-26 ENCOUNTER — Inpatient Hospital Stay: Attending: Radiation Oncology | Admitting: Internal Medicine

## 2024-06-27 ENCOUNTER — Inpatient Hospital Stay

## 2024-06-27 ENCOUNTER — Inpatient Hospital Stay: Admitting: Hematology and Oncology
